# Patient Record
Sex: Female | Born: 1935 | Race: White | Hispanic: No | Smoking: Never smoker
Health system: Southern US, Community
[De-identification: ages and names within clinical notes are randomized; demographics above are authoritative.]

## PROBLEM LIST (undated history)

## (undated) DIAGNOSIS — R58 Hemorrhage, not elsewhere classified: Secondary | ICD-10-CM

## (undated) DIAGNOSIS — I87303 Chronic venous hypertension (idiopathic) without complications of bilateral lower extremity: Secondary | ICD-10-CM

## (undated) DIAGNOSIS — T7840XA Allergy, unspecified, initial encounter: Secondary | ICD-10-CM

## (undated) DIAGNOSIS — T148XXA Other injury of unspecified body region, initial encounter: Secondary | ICD-10-CM

## (undated) DIAGNOSIS — R2681 Unsteadiness on feet: Secondary | ICD-10-CM

## (undated) DIAGNOSIS — R739 Hyperglycemia, unspecified: Secondary | ICD-10-CM

## (undated) DIAGNOSIS — M8580 Other specified disorders of bone density and structure, unspecified site: Secondary | ICD-10-CM

## (undated) DIAGNOSIS — K5730 Diverticulosis of large intestine without perforation or abscess without bleeding: Secondary | ICD-10-CM

## (undated) DIAGNOSIS — D62 Acute posthemorrhagic anemia: Secondary | ICD-10-CM

## (undated) DIAGNOSIS — I10 Essential (primary) hypertension: Secondary | ICD-10-CM

## (undated) DIAGNOSIS — N183 Chronic kidney disease, stage 3 (moderate): Secondary | ICD-10-CM

## (undated) DIAGNOSIS — M7071 Other bursitis of hip, right hip: Secondary | ICD-10-CM

## (undated) DIAGNOSIS — J45909 Unspecified asthma, uncomplicated: Secondary | ICD-10-CM

## (undated) DIAGNOSIS — K5521 Angiodysplasia of colon with hemorrhage: Secondary | ICD-10-CM

## (undated) DIAGNOSIS — D126 Benign neoplasm of colon, unspecified: Secondary | ICD-10-CM

## (undated) DIAGNOSIS — E785 Hyperlipidemia, unspecified: Secondary | ICD-10-CM

## (undated) DIAGNOSIS — R32 Unspecified urinary incontinence: Secondary | ICD-10-CM

## (undated) DIAGNOSIS — H269 Unspecified cataract: Secondary | ICD-10-CM

## (undated) DIAGNOSIS — I82402 Acute embolism and thrombosis of unspecified deep veins of left lower extremity: Secondary | ICD-10-CM

## (undated) HISTORY — DX: Acute embolism and thrombosis of unspecified deep veins of left lower extremity: I82.402

## (undated) HISTORY — DX: Angiodysplasia of colon with hemorrhage: K55.21

## (undated) HISTORY — DX: Hyperglycemia, unspecified: R73.9

## (undated) HISTORY — DX: Other specified disorders of bone density and structure, unspecified site: M85.80

## (undated) HISTORY — DX: Hyperlipidemia, unspecified: E78.5

## (undated) HISTORY — DX: Allergy, unspecified, initial encounter: T78.40XA

## (undated) HISTORY — DX: Unspecified cataract: H26.9

## (undated) HISTORY — DX: Unspecified urinary incontinence: R32

## (undated) HISTORY — DX: Unspecified asthma, uncomplicated: J45.909

## (undated) HISTORY — DX: Hemorrhage, not elsewhere classified: R58

## (undated) HISTORY — DX: Chronic kidney disease, stage 3 (moderate): N18.3

## (undated) HISTORY — DX: Acute posthemorrhagic anemia: D62

## (undated) HISTORY — DX: Benign neoplasm of colon, unspecified: D12.6

## (undated) HISTORY — DX: Diverticulosis of large intestine without perforation or abscess without bleeding: K57.30

## (undated) HISTORY — DX: Chronic venous hypertension (idiopathic) without complications of bilateral lower extremity: I87.303

## (undated) HISTORY — DX: Other injury of unspecified body region, initial encounter: T14.8XXA

## (undated) HISTORY — PX: CATARACT EXTRACTION W/ INTRAOCULAR LENS IMPLANT: SHX1309

## (undated) HISTORY — DX: Unsteadiness on feet: R26.81

## (undated) HISTORY — DX: Essential (primary) hypertension: I10

---

## 1999-12-10 ENCOUNTER — Encounter: Admission: RE | Admit: 1999-12-10 | Discharge: 1999-12-10 | Payer: Self-pay | Admitting: Internal Medicine

## 1999-12-10 ENCOUNTER — Encounter: Payer: Self-pay | Admitting: Internal Medicine

## 1999-12-18 ENCOUNTER — Other Ambulatory Visit: Admission: RE | Admit: 1999-12-18 | Discharge: 2000-01-03 | Payer: Self-pay | Admitting: Internal Medicine

## 2000-12-10 ENCOUNTER — Encounter: Payer: Self-pay | Admitting: Internal Medicine

## 2000-12-10 ENCOUNTER — Encounter: Admission: RE | Admit: 2000-12-10 | Discharge: 2000-12-10 | Payer: Self-pay | Admitting: Internal Medicine

## 2001-12-11 ENCOUNTER — Encounter: Admission: RE | Admit: 2001-12-11 | Discharge: 2001-12-11 | Payer: Self-pay | Admitting: Internal Medicine

## 2001-12-11 ENCOUNTER — Encounter: Payer: Self-pay | Admitting: Internal Medicine

## 2002-12-13 ENCOUNTER — Encounter: Payer: Self-pay | Admitting: Internal Medicine

## 2002-12-13 ENCOUNTER — Encounter: Admission: RE | Admit: 2002-12-13 | Discharge: 2002-12-13 | Payer: Self-pay | Admitting: Internal Medicine

## 2002-12-21 ENCOUNTER — Other Ambulatory Visit: Admission: RE | Admit: 2002-12-21 | Discharge: 2002-12-21 | Payer: Self-pay | Admitting: Internal Medicine

## 2003-09-26 ENCOUNTER — Encounter (INDEPENDENT_AMBULATORY_CARE_PROVIDER_SITE_OTHER): Payer: Self-pay | Admitting: Specialist

## 2003-09-26 ENCOUNTER — Ambulatory Visit (HOSPITAL_COMMUNITY): Admission: RE | Admit: 2003-09-26 | Discharge: 2003-09-26 | Payer: Self-pay | Admitting: Emergency Medicine

## 2003-12-16 ENCOUNTER — Ambulatory Visit (HOSPITAL_COMMUNITY): Admission: RE | Admit: 2003-12-16 | Discharge: 2003-12-16 | Payer: Self-pay | Admitting: Internal Medicine

## 2004-05-13 HISTORY — PX: ABDOMINAL HYSTERECTOMY: SHX81

## 2004-05-31 ENCOUNTER — Encounter (INDEPENDENT_AMBULATORY_CARE_PROVIDER_SITE_OTHER): Payer: Self-pay | Admitting: Specialist

## 2004-06-01 ENCOUNTER — Inpatient Hospital Stay (HOSPITAL_COMMUNITY): Admission: RE | Admit: 2004-06-01 | Discharge: 2004-06-02 | Payer: Self-pay | Admitting: *Deleted

## 2004-12-17 ENCOUNTER — Ambulatory Visit (HOSPITAL_COMMUNITY): Admission: RE | Admit: 2004-12-17 | Discharge: 2004-12-17 | Payer: Self-pay | Admitting: Internal Medicine

## 2005-03-06 ENCOUNTER — Ambulatory Visit: Payer: Self-pay | Admitting: Gastroenterology

## 2005-03-26 ENCOUNTER — Ambulatory Visit: Payer: Self-pay | Admitting: Gastroenterology

## 2005-03-26 ENCOUNTER — Encounter (INDEPENDENT_AMBULATORY_CARE_PROVIDER_SITE_OTHER): Payer: Self-pay | Admitting: *Deleted

## 2005-03-26 DIAGNOSIS — D126 Benign neoplasm of colon, unspecified: Secondary | ICD-10-CM

## 2005-03-26 DIAGNOSIS — K5730 Diverticulosis of large intestine without perforation or abscess without bleeding: Secondary | ICD-10-CM | POA: Insufficient documentation

## 2005-03-26 DIAGNOSIS — K5521 Angiodysplasia of colon with hemorrhage: Secondary | ICD-10-CM

## 2005-03-26 HISTORY — DX: Benign neoplasm of colon, unspecified: D12.6

## 2005-03-26 HISTORY — DX: Diverticulosis of large intestine without perforation or abscess without bleeding: K57.30

## 2005-12-19 ENCOUNTER — Ambulatory Visit (HOSPITAL_COMMUNITY): Admission: RE | Admit: 2005-12-19 | Discharge: 2005-12-19 | Payer: Self-pay | Admitting: Internal Medicine

## 2006-01-09 ENCOUNTER — Ambulatory Visit (HOSPITAL_COMMUNITY): Admission: RE | Admit: 2006-01-09 | Discharge: 2006-01-09 | Payer: Self-pay | Admitting: Internal Medicine

## 2006-12-22 ENCOUNTER — Ambulatory Visit (HOSPITAL_COMMUNITY): Admission: RE | Admit: 2006-12-22 | Discharge: 2006-12-22 | Payer: Self-pay | Admitting: Internal Medicine

## 2007-12-30 ENCOUNTER — Ambulatory Visit (HOSPITAL_COMMUNITY): Admission: RE | Admit: 2007-12-30 | Discharge: 2007-12-30 | Payer: Self-pay | Admitting: Internal Medicine

## 2008-04-21 ENCOUNTER — Encounter: Admission: RE | Admit: 2008-04-21 | Discharge: 2008-04-21 | Payer: Self-pay | Admitting: Internal Medicine

## 2009-01-02 ENCOUNTER — Ambulatory Visit (HOSPITAL_COMMUNITY): Admission: RE | Admit: 2009-01-02 | Discharge: 2009-01-02 | Payer: Self-pay | Admitting: Internal Medicine

## 2010-01-12 ENCOUNTER — Ambulatory Visit (HOSPITAL_COMMUNITY): Admission: RE | Admit: 2010-01-12 | Discharge: 2010-01-12 | Payer: Self-pay | Admitting: Internal Medicine

## 2010-02-06 ENCOUNTER — Encounter: Payer: Self-pay | Admitting: Gastroenterology

## 2010-02-23 ENCOUNTER — Encounter (INDEPENDENT_AMBULATORY_CARE_PROVIDER_SITE_OTHER): Payer: Self-pay | Admitting: *Deleted

## 2010-03-26 ENCOUNTER — Encounter (INDEPENDENT_AMBULATORY_CARE_PROVIDER_SITE_OTHER): Payer: Self-pay | Admitting: *Deleted

## 2010-04-17 ENCOUNTER — Ambulatory Visit: Payer: Self-pay | Admitting: Gastroenterology

## 2010-05-15 ENCOUNTER — Ambulatory Visit (HOSPITAL_COMMUNITY)
Admission: RE | Admit: 2010-05-15 | Discharge: 2010-05-15 | Payer: Self-pay | Source: Home / Self Care | Attending: Gastroenterology | Admitting: Gastroenterology

## 2010-05-15 ENCOUNTER — Encounter: Payer: Self-pay | Admitting: Gastroenterology

## 2010-06-03 ENCOUNTER — Encounter: Payer: Self-pay | Admitting: Internal Medicine

## 2010-06-12 NOTE — Letter (Signed)
Summary: Colonoscopy-Changed to Office Visit Letter  [facility]  [address] [place], [place] [address]   Phone: [phone]  Fax: [phone]      February 06, 2010 MRN: [id number]   [facility] [address] [place], [place]  [address]   Dear Ms. Dowse,   According to our records, it is time for you to schedule a Colonoscopy. However, after reviewing your medical record, I feel that an office visit would be most appropriate to more completely evaluate you and determine your need for a repeat procedure.  Please call [phone] (option #2) at your convenience to schedule an office visit. If you have any questions, concerns, or feel that this letter is in error, we would appreciate your call.   Sincerely,  Barbette Hair. Arlyce Dice, M.D.  [facility] Gastroenterology Division [phone]

## 2010-06-12 NOTE — Letter (Signed)
Summary: [facility] Instructions  [facility]  [address] [place], [place] [address]   Phone: [phone]  Fax: [phone]       MYRLA Champine    04-19-1926    MRN: [id number]        Procedure Day /Date:TUESDAY 05/15/2010      Arrival Time:11:30AM     Procedure Time:12:30PM     Location of Procedure:                     X   [facility] ( Outpatient Registration)   PREPARATION FOR COLONOSCOPY WITH MOVIPREP/ERBY   Starting 5 days prior to your procedure12/27/2011 do not eat nuts, seeds, popcorn, corn, beans, peas,  salads, or any raw vegetables.  Do not take any fiber supplements (e.g. Metamucil, Citrucel, and Benefiber).  THE DAY BEFORE YOUR PROCEDURE         DATE: 05/14/2010  DAY: MONDAY  1.  Drink clear liquids the entire day-NO SOLID FOOD  2.  Do not drink anything colored red or purple.  Avoid juices with pulp.  No orange juice.  3.  Drink at least 64 oz. (8 glasses) of fluid/clear liquids during the day to prevent dehydration and help the prep work efficiently.  CLEAR LIQUIDS INCLUDE: Water Jello Ice Popsicles Tea (sugar ok, no milk/cream) Powdered fruit flavored drinks Coffee (sugar ok, no milk/cream) Gatorade Juice: apple, white grape, white cranberry  Lemonade Clear bullion, consomm, broth Carbonated beverages (any kind) Strained chicken noodle soup Hard Candy                             4.  In the morning, mix first dose of MoviPrep solution:    Empty 1 Pouch A and 1 Pouch B into the disposable container    Add lukewarm drinking water to the top line of the container. Mix to dissolve    Refrigerate (mixed solution should be used within 24 hrs)  5.  Begin drinking the prep at 5:00 p.m. The MoviPrep container is divided by 4 marks.   Every 15 minutes drink the solution down to the next mark (approximately 8 oz) until the full liter is complete.   6.  Follow completed prep with 16 oz of clear liquid of your choice (Nothing  red or purple).  Continue to drink clear liquids until bedtime.  7.  Before going to bed, mix second dose of MoviPrep solution:    Empty 1 Pouch A and 1 Pouch B into the disposable container    Add lukewarm drinking water to the top line of the container. Mix to dissolve    Refrigerate  THE DAY OF YOUR PROCEDURE      DATE: 05/15/2010 [id number]  Beginning at 7:30a.m. (5 hours before procedure):         1. Every 15 minutes, drink the solution down to the next mark (approx 8 oz) until the full liter is complete.  2. Follow completed prep with 16 oz. of clear liquid of your choice.    3. You may drink clear liquids until 10:30AM (2 HOURS BEFORE PROCEDURE).   MEDICATION INSTRUCTIONS  Unless otherwise instructed, you should take regular prescription medications with a small sip of water   as early as possible the morning of your procedure         OTHER INSTRUCTIONS  You will need a responsible adult at least 75 years of age to accompany  you and drive you home.   This person must remain in the waiting room during your procedure.  Wear loose fitting clothing that is easily removed.  Leave jewelry and other valuables at home.  However, you may wish to bring a book to read or  an iPod/MP3 player to listen to music as you wait for your procedure to start.  Remove all body piercing jewelry and leave at home.  Total time from sign-in until discharge is approximately 2-3 hours.  You should go home directly after your procedure and rest.  You can resume normal activities the  day after your procedure.  The day of your procedure you should not:   Drive   Make legal decisions   Operate machinery   Drink alcohol   Return to work  You will receive specific instructions about eating, activities and medications before you leave.    The above instructions have been reviewed and explained to me by   _______________________    I fully understand and can verbalize these  instructions _____________________________ Date _________

## 2010-06-12 NOTE — Letter (Signed)
Summary: New Patient letter  [facility]  [address] [place], [place] [address]   Phone: [phone]  Fax: [phone]       02/23/2010 MRN: [id number]  Yolanda [name] [address] [place], [place]  [address]  Dear Yolanda [name],  Welcome to the Gastroenterology Division at [facility].    You are scheduled to see Dr.  Arlyce Dice on 04-17-10 at 10:45a.m. on the 3rd floor at [facility], [address]  We ask that you try to arrive at our office 15 minutes prior to your appointment time to allow for check-in.  We would like you to complete the enclosed self-administered evaluation form prior to your visit and bring it with you on the day of your appointment.  We will review it with you.  Also, please bring a complete list of all your medications or, if you prefer, bring the medication bottles and we will list them.  Please bring your insurance card so that we may make a copy of it.  If your insurance requires a referral to see a specialist, please bring your referral form from your primary care physician.  Co-payments are due at the time of your visit and may be paid by cash, check or credit card.     Your office visit will consist of a consult with your physician (includes a physical exam), any laboratory testing he/she may order, scheduling of any necessary diagnostic testing (e.g. x-ray, ultrasound, CT-scan), and scheduling of a procedure (e.g. Endoscopy, Colonoscopy) if required.  Please allow enough time on your schedule to allow for any/all of these possibilities.    If you cannot keep your appointment, please call [phone] to cancel or reschedule prior to your appointment date.  This allows [place] the opportunity to schedule an appointment for another patient in need of care.  If you do not cancel or reschedule by 5 p.m. the business day prior to your appointment date, you will be charged a $50.00 late cancellation/no-show fee.    Thank  you for choosing [facility] for your medical needs.  We appreciate the opportunity to care for you.  Please visit [place] at our website  to learn more about our practice.                     Sincerely,                                                             The Gastroenterology Division

## 2010-06-12 NOTE — Procedures (Signed)
Summary: Colonoscopy   Colonoscopy  Procedure date:  03/26/2005  Findings:      569.85 Angiodysplasia, intestinalResults: Polyp.  Results: Diverticulosis.       Location:  [facility].    Procedures Next Due Date:    Colonoscopy: 03/2010  Patient Name: Yolanda [name], Yolanda [name] MRN:  Procedure Procedures: Colonoscopy CPT: 51884.    with Hot Biopsy(s)CPT: Z451292.  Personnel: Endoscopist: Barbette Hair. Arlyce Dice, MD.  Patient Consent: Procedure, Alternatives, Risks and Benefits discussed, consent obtained, from patient.  Indications  Surveillance of: Adenomatous Polyp(s). Initial polypectomy was performed in 2003.  History  Current Medications: Patient is not currently taking Coumadin.  Pre-Exam Physical: Performed Mar 26, 2005. Cardio-pulmonary exam, HEENT exam , Abdominal exam, Mental status exam WNL.  Exam Exam: Extent of exam reached: Cecum, extent intended: Cecum.  The cecum was identified by IC valve. Colon retroflexion performed. ASA Classification: I. Tolerance: good.  Monitoring: Pulse and BP monitoring, Oximetry used. Supplemental O2 given. at 2 Liters.  Colon Prep Used Miralax for colon prep. Prep results: good.  Sedation Meds: Patient assessed and found to be appropriate for moderate (conscious) sedation. Sedation was managed by the Endoscopist. Fentanyl 50 mcg. given IV. Versed 5 mg. given IV.  Findings POLYP: Hepatic Flexure, Maximum size: 3 mm. Procedure:  hot biopsy, ICD9: Colon Polyps: 211.3.  - DIVERTICULOSIS: Descending Colon to Sigmoid Colon. ICD9: Diverticulosis: 562.10. Comments: Scattered diverticula.  ANGIODYSPLASIA (AVMs): 1 AVMs, maximum size 3 mm, non-bleeding in Cecum. ICD9: Angiodysplasia, Intestinal: 569.85.  NORMAL EXAM: Cecum.   Assessment Abnormal examination, see findings above.  Diagnoses: 569.85: Angiodysplasia, Intestinal.  562.10: Diverticulosis.  211.3: Colon Polyps.   Events  Unplanned Interventions: No  intervention was required.  Unplanned Events: There were no complications. Plans  Post Exam Instructions: Post sedation instructions given.  Patient Education: Patient given standard instructions for: Polyps. Diverticulosis.  Disposition: After procedure patient sent to recovery. After recovery patient sent home.  Scheduling/Referral: Colonoscopy, to Barbette Hair. Arlyce Dice, MD, around Mar 26, 2010.    cc: Erskine Speed, MD  This report was created from the original endoscopy report, which was reviewed and signed by the above listed endoscopist.

## 2010-06-12 NOTE — Procedures (Signed)
Summary: Instructions for procedure/[place]  Instructions for procedure/[place]   Imported By: Sherian Rein 04/19/2010 10:25:01  _____________________________________________________________________  External Attachment:    Type:   Image     Comment:   External Document

## 2010-06-14 NOTE — Procedures (Signed)
Summary: Colonoscopy  Patient: Yolanda [name] Note: All result statuses are Final unless otherwise noted.  Tests: (1) Colonoscopy (COL)   COL Colonoscopy           DONE     [facility]     [address] [place], [place]  [address]           COLONOSCOPY PROCEDURE REPORT           PATIENT:  Yolanda [name], Yolanda [name]  MR#:  [id number]     BIRTHDATE:  1926-01-12, 84 yrs. old  GENDER:  female     ENDOSCOPIST:  Barbette Hair. Arlyce Dice, MD     REF. BY:  Nila Nephew, M.D.     PROCEDURE DATE:  05/15/2010     PROCEDURE:  Colonoscopy with ablation     ASA CLASS:  Class II     INDICATIONS:  screening, history of pre-cancerous (adenomatous)     colon polyps, other Last exam 2006. 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  Digital rectal exam was performed and     revealed no abnormalities.   The [id number]) endoscope was     introduced through the anus and advanced to the cecum, which was     identified by both the appendix and ileocecal valve, without     limitations.  The quality of the prep was excellent, using     MoviPrep.  The instrument was then slowly withdrawn as the colon     was fully examined.     <<PROCEDUREIMAGES>>           FINDINGS:  Arteriovenous malformations were seen in the in the     cecum. 2 cecal AVMS, the largest measuring 4mm, smaller AVM .     The latter was fulgurated with the APC. I decided not to fulgurate     the larger lesion because of increased risk for acute bleeding     (see image4, image6, and image10).  Scattered diverticula were     found. Few scattered diverticula descending to ascending colon     This was otherwise a normal examination of the colon (see image5,     image13, image17, image18, and image20).   Retroflexed views  in     the rectum revealed no abnormalities.    The scope was then     withdrawn from the patient and the procedure completed.           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 GI follow up as     needed     REPEAT EXAM:  No           ______________________________     Barbette Hair. Arlyce Dice, MD           CC:           n.  eSIGNED:   Barbette Hair. Kaplan at 05/15/2010 01:13 PM           [place], Shirlee Limerick, [phone]  Note: An exclamation mark (!) indicates a result that was not dispersed into the flowsheet. Document Creation Date: 05/15/2010 1:14 PM _______________________________________________________________________  (1) Order result status: Final Collection or observation date-time: 05/15/2010 13:04 Requested date-time:  Receipt date-time:  Reported date-time:  Referring Physician:   Ordering Physician: Melvia Heaps [phone]) Specimen Source:  Source: Launa Grill Order Number: [id number] Lab site:

## 2010-06-14 NOTE — Assessment & Plan Note (Signed)
Summary: discuss colon due to age--ch.   History of Present Illness Visit Type: Initial Visit Primary GI MD: Melvia Heaps MD [facility] Primary Provider: Nila Nephew, MD Chief Complaint: discuss colon due to age History of Present Illness:   Yolanda [name] is an 77 year old white female referred at the request of Dr. Chilton Si for colonoscopy.  Three colon polyps were removed in 2003.  A single adenomatous polyp was removed in 2006.  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  The patient has  no GI complaints including change in bowel habits, abdominal pain, melena or hematochezia.  She has lost 40 pounds over the past year from dieting.   GI Review of Systems    Reports weight loss.      Denies abdominal pain, acid reflux, belching, bloating, chest pain, dysphagia with liquids, dysphagia with solids, heartburn, loss of appetite, nausea, vomiting, vomiting blood, and  weight gain.        Denies anal fissure, black tarry stools, change in bowel habit, constipation, diarrhea, diverticulosis, fecal incontinence, heme positive stool, hemorrhoids, irritable bowel syndrome, jaundice, light color stool, liver problems, rectal bleeding, and  rectal pain. Preventive Screening-Counseling & Management      Drug Use:  no.      Current Medications (verified): 1)  Terazosin Hcl 10 Mg Caps (Terazosin Hcl) .... 2 By Mouth Once Daily 2)  Lisinopril 40 Mg Tabs (Lisinopril) .Marland Kitchen.. 1 By Mouth Once Daily 3)  Tramadol Hcl 50 Mg Tabs (Tramadol Hcl) .... Take 1 Po Two Times A Day 4)  Aspirin 81 Mg Tbec (Aspirin) .Marland Kitchen.. 1 By Mouth Once Daily 5)  Prednisone 5 Mg Tabs (Prednisone) .Marland Kitchen.. 1 By Mouth Once Daily 6)  Crestor 5 Mg Tabs (Rosuvastatin Calcium) .Marland Kitchen.. 1 By Mouth Once Daily  Allergies (verified): 1)  ! Epinephrine 2)  ! Vicodin  Past History:  Past Medical History: Diverticulosis Hyperplastic Polyps 2006 Adenomatous Polyp 2006 Arthritis Diabetes Kidney Stones Obesity  Past Surgical History: Reviewed  history from 04/12/2010 and no changes required. Hysterectomy  Family History: Reviewed history and no changes required. Family History of Breast Cancer:mother No FH of Colon Cancer:  Social History: Reviewed history from 04/12/2010 and no changes required. Occupation: Retired Patient has never smoked.  Alcohol Use - no Daily Caffeine Use Illicit Drug Use - no married 2 boysDrug Use:  no  Review of Systems       The patient complains of arthritis/joint pain and back pain.  The patient denies allergy/sinus, anemia, anxiety-new, blood in urine, breast changes/lumps, change in vision, confusion, cough, coughing up blood, depression-new, fainting, fatigue, fever, headaches-new, hearing problems, heart murmur, heart rhythm changes, itching, menstrual pain, muscle pains/cramps, night sweats, nosebleeds, pregnancy symptoms, shortness of breath, skin rash, sleeping problems, sore throat, swelling of feet/legs, swollen lymph glands, thirst - excessive , urination - excessive , urination changes/pain, urine leakage, vision changes, and voice change.         All other systems were reviewed and were negative   Vital Signs:  Patient profile:   77 year old female Height:      65 inches Weight:      183 pounds BMI:     30.56 Pulse rate:   84 / minute Pulse rhythm:   regular BP sitting:   108 / 48  (left arm)  Vitals Entered By: Milford Cage NCMA (April 17, 2010 11:09 AM)  Physical Exam  Additional Exam:  On physical exam she is a well-developed well-nourished female  skin: anicteric HEENT: normocephalic; PEERLA; no nasal or pharyngeal abnormalities neck: supple nodes: no cervical lymphadenopathy chest: clear to ausculatation and percussion heart: no, gallops, or rubs; there is a 2/6 early systolic murmur at the left sternal border abd: soft, nontender; BS normoactive; no abdominal masses, tenderness, organomegaly rectal: deferred ext: no cynanosis, clubbing; there is 1+ pedal  edema skeletal: no deformities neuro: oriented x 3; no focal abnormalities    Impression & Recommendations:  Problem # 1:  COLONIC POLYPS (ICD-211.3)  Plan followup colonoscopy  Orders: ZCOL APC (ZCOL APC)  Problem # 2:  ANGIODYSPLASIA, INTESTINE, WITH HEMORRHAGE (ICD-569.85)  AVM seen at colonoscopy.   I will cauterize it with the argon plasma coagulator because of its potential for bleeding if  identified   Patient Instructions: 1)  Copy sent to : Nila Nephew, MD 2)  Your Colonoscopy with [facility] is scheduled on 05/15/2010 at 12:30pm at [facility]. 3)  We are sending the MoviPrep in to your pharmacy today 4)  Colonoscopy and Flexible Sigmoidoscopy brochure given.  5)  Conscious Sedation brochure given.  6)  The medication list was reviewed and reconciled.  All changed / newly prescribed medications were explained.  A complete medication list was provided to the patient / caregiver. Prescriptions: MOVIPREP 100 GM  SOLR (PEG-KCL-NACL-NASULF-NA ASC-C) As per prep instructions.  #1 x 0   Entered by:   Merri Ray CMA (AAMA)   Authorized by:   Louis Meckel MD   Signed by:   Merri Ray CMA (AAMA) on 04/17/2010   Method used:   Electronically to        [facility]  [phone]* (retail)       [address]       [place], [place]  [address]       Ph: [phone] or [phone]       Fax: [phone]   RxID:   [id number]

## 2010-09-28 NOTE — Discharge Summary (Signed)
NAME:  Yolanda [name], Yolanda [name] NO.:  [id number]   MEDICAL RECORD NO.:  [id number]          PATIENT TYPE:  INP   LOCATION:  9304                          FACILITY:  [facility]   PHYSICIAN:  Kingfisher B. Earlene Plater, M.D.  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   PROCEDURES:  1.  Transvaginal hysterectomy.  2.  Uterosacral ligament suspension, vaginal vault.  3.  Anterior and posterior repair.  4.  Cystoscopy   HISTORY OF PRESENT ILLNESS:  A 77 year old white female, history is outlined  above, presents for definitive surgical management.   On the day of surgery, the patient underwent above procedures without  incident.  Cystoscopy was also performed, after the ligament suspension  which showed patency of each ureter.  Postoperatively, the patient rapidly  regained her ability to ambulate, void, and tolerate a regular diet.  On the  second postoperative day, she had a mild temperature elevation to 99-100  degree range.  She was noted to have crackles in the right lower lobe.  Chest x-ray and CBC within normal limits.  The crackling resolved with  incentive spirometry.  The patient was discharged home on the second  postoperative day in satisfactory condition.   DISCHARGE INSTRUCTIONS:  Standard instructions given prior to dismissal.   DISCHARGE MEDICATIONS:  1.  Resume blood pressure medicines in 4-5 days.  2.  Vicodin 1-2 tabs every four to six hours p.r.n. pain.   FOLLOW UP:  Follow up, [facility], Dr. Earlene Plater in four weeks.   DISPOSITION:  Satisfactory.     Wesl   WBD/MEDQ  D:  06/02/2004  T:  06/02/2004  Job:  [id number]   cc:   Erskine Speed,  M.D.  [address]  [place]  [place] [address]  Fax: [phone]

## 2010-09-28 NOTE — Op Note (Signed)
NAME:  Yolanda [name], Yolanda [name]                     ACCOUNT NO.:  [id number]   MEDICAL RECORD NO.:  [id number]                   PATIENT TYPE:  AMB   LOCATION:  SDC                                  [facility]ILITY:  [facility]   PHYSICIAN:  Wrigley B. Earlene Plater, M.D.               DATE OF BIRTH:  [birth year]   DATE OF PROCEDURE:  09/26/2003  DATE OF DISCHARGE:                                 OPERATIVE REPORT   PREOPERATIVE DIAGNOSES:  Postmenopausal bleeding and possible endometrial  polyp.   POSTOPERATIVE DIAGNOSES:  Postmenopausal bleeding and possible endometrial  polyp.   PROCEDURE:  Hysteroscopy with polyp removal.   SURGEON:  Dr. Marina Gravel.   ANESTHESIA:  MAC and 20 cc of 1% lidocaine paracervical block.   FINDINGS:  A stenotic internal cervical os, endocervical polyp and uterine  prolapse.   SPECIMENS:  Endocervical polyp.   ESTIMATED BLOOD LOSS:  Less than 50 cc.   COMPLICATIONS:  None.   INDICATIONS:  The patient with a history of postmenopausal bleeding.  Ultrasound in the office showed a focal mass consistent with an endometrial  polyp.   PROCEDURE:  The patient was taken to the operating room and MAC anesthesia  obtained.  She was placed in the Alice stirrups and prepped and draped in a  standard fashion.  The bladder was emptied with a red rubber catheter.   The examination under anesthesia showed a mid plane normal-sized uterus.  No  adnexal masses.   The speculum was inserted.  The single-toothed tenaculum attached and  paracervical block placed in a standard technique.   I attempted to sound the uterus however, could not pass above the  endocervical os with the sound or small dilator.  I therefore requested  ultrasound.  While waiting, I did remove an endocervical polyp and insert  the hysteroscope after being flushed with Sorbitol.  The endocervical cavity  appeared normal.  There was no obvious passage up into the uterine cavity  visualized.   Under ultrasound  guidance, I attempted to sound the uterus and could not  pass into the endometrial cavity.  The visualization of the uterine fundus  was marginal due to instruments and bowel gas.  This was despite filling the  bladder with 180 cc of saline.   In that a specimen was removed and I could not pass into the uterine cavity,  I terminated the procedure.   The patient tolerated the procedure well.  There were no complications.  She  was taken to the recovery room awake, alert and in stable condition.                                               Gerri Spore B. Earlene Plater, M.D.    WBD/MEDQ  D:  09/26/2003  T:  09/26/2003  Job:  [id number] 

## 2010-09-28 NOTE — Op Note (Signed)
NAME:  Yolanda [name], Yolanda [name] NO.:  [id number]   MEDICAL RECORD NO.:  [id number]          PATIENT TYPE:  OBV   LOCATION:  9399                          FACILITY:  [facility]   PHYSICIAN:  Gackle B. Earlene Plater, M.D.  DATE OF BIRTH:  [birth year]   DATE OF PROCEDURE:  05/31/2004  DATE OF DISCHARGE:                                 OPERATIVE REPORT   PREOPERATIVE DIAGNOSES:  1.  Uterine prolapse.  2.  Cystocele.  3.  Rectocele.  4.  Vaginal vault prolapse.   PROCEDURES:  1.  Transvaginal hysterectomy.  2.  Anterior and posterior repair.  3.  Uterosacral ligament suspension.  4.  Cystoscopy.   SURGEON:  Chester Holstein. Earlene Plater, M.D.   ASSISTANT:  Cordelia Pen A. Rosalio Macadamia, M.D.   ANESTHESIA:  General.   FINDINGS:  A complete procidentia of the uterus, cervix and vaginal vault.  Also associated cystocele and rectocele.   INDICATIONS:  Patient with above history and associated vaginal pressure and  discomfort.  This was not successfully managed with various pessaries.  The  patient presents for definitive surgical therapy.  She was advised of the  risks, including infection, bleeding, damage to bowel, bladder or  surrounding organs, the potential injury to ureter and potential vaginal  shortening which might make intercourse painful.  She was offered  alternatives such as continued pessary use or colpocleisis; however, the  patient wished to maintain sexual function.   PROCEDURE:  The patient was taken to the operating room and general  anesthesia was obtained.  She was prepped and draped in standard fashion and  a Foley catheter inserted into the bladder.  The patient was then examined  under anesthesia with the above findings noted.  Rectovaginal exam showed a  small rectocele, no evidence of enterocele.   The cervix was replaced to a more anatomical position and a weighted  speculum inserted into the posterior vagina.  The posterior cul-de-sac was  entered sharply and each uterosacral  ligament clamped with a curved Heaney  clamp, divided sharply and suture ligated with 0 Vicryl.  The remainder of  the vagina was circumscribed with the Bovie and the anterior colpotomy made  sharply.  Deaver retractor placed into the anterior cul-de-sac.  The ureter  palpated on each side through the anterior cul-de-sac.   The remainder of the cardinal ligaments were then serially clamped with the  LigaSure, cauterized and divided.  This continued to the level of the  cornua.  Each ovary appeared normal, and the patient had requested to retain  them should they appear normal.  Therefore, each uterine cornu was clamped  with the LigaSure and cauterized and divided, uterus and cervix delivered as  an intact specimen.   The patient's left uterosacral ligament was placed on traction and grasped  more dorsally toward the sacrum at approximately the midportion.  The course  of the ureter was palpated and was about 4 cm ventral to the area of  interest.  The horizon was visualized in this area from 3 to 9 o'clock and  the 0 Vicryl suture needle kept below this horizon.  A figure-of-eight  bite  of the uterosacral ligament was obtained and tagged for later use.  Repeated  on the opposite side in a similar fashion.   The anterior repair was then performed by undermining the vaginal mucosa in  the midline and incising it sharply.  The pubovesical fascia was divided  sharply.  The redundant vaginal mucosa was excised and the pubovesical  fascia reapproximated with interrupted sutures of 0 Vicryl.  The vaginal  mucosa was then closed in a running locked stitch of 0 Vicryl.  Indigo  carmine was given IV.   Each uterosacral ligament suspension suture was then tied down and  cystoscopy performed.  Immediate flow was noted from the right side.  The  left side was a little bit sluggish but responded almost immediately to a  single dose of IV Lasix; therefore, I think Lasix probably had not had time   to take effect, simply the ureter began to freely spill.  The bladder itself  showed no evidence of injury as well. The Foley catheter was placed after  the cystoscopy.    The uterosacral ligament suspension sutures were then used to secure the  vaginal apex anteriorly and posteriorly, including the peritoneum and  fascia.  The remainder of the incision was then closed with interrupted  figure-of-eight sutures of 0 Vicryl.   There was a small residual rectocele noted.  The perineal body was grasped  with Allis clamps and incised sharply with a knife.  The vaginal mucosa was  undermined in the midline with the Metzenbaum scissors and incised sharply.  The rectovaginal fascia was then dissected sharply.  The rectovaginal fascia  was then plicated in the midline with interrupted sutures of 0 Vicryl.  The  redundant vaginal mucosa was then trimmed away.  The vaginal incision then  closed with running locked stitch of 0 Vicryl.  Perineum was reapproximated  with a figure-of-eight suture of 0 Vicryl and the overlying skin closed with  subcuticular 3-0 Vicryl.  A rectal exam showed integrity of the rectum  without any foreign body palpable.  The vagina was packed with Estrace-  soaked gauze.   The patient tolerated the procedure well.  There were no complications.  She  was taken to the recovery room awake and alert, in stable condition.  All  counts were correct.     Wesl   WBD/MEDQ  D:  05/31/2004  T:  05/31/2004  Job:  [id number]   cc:   Erskine Speed, M.D.  [address]  [place]  [place] [address]  Fax: [phone]

## 2010-12-24 ENCOUNTER — Other Ambulatory Visit (HOSPITAL_COMMUNITY): Payer: Self-pay | Admitting: Internal Medicine

## 2010-12-24 DIAGNOSIS — Z1231 Encounter for screening mammogram for malignant neoplasm of breast: Secondary | ICD-10-CM

## 2011-01-15 ENCOUNTER — Ambulatory Visit (HOSPITAL_COMMUNITY)
Admission: RE | Admit: 2011-01-15 | Discharge: 2011-01-15 | Disposition: A | Discharge status: Home / Self Care | Payer: 59 | Source: Ambulatory Visit | Attending: Internal Medicine | Admitting: Internal Medicine

## 2011-01-15 DIAGNOSIS — Z1231 Encounter for screening mammogram for malignant neoplasm of breast: Secondary | ICD-10-CM

## 2011-12-25 ENCOUNTER — Other Ambulatory Visit (HOSPITAL_COMMUNITY): Payer: Self-pay | Admitting: Internal Medicine

## 2011-12-25 DIAGNOSIS — M8580 Other specified disorders of bone density and structure, unspecified site: Secondary | ICD-10-CM

## 2011-12-25 DIAGNOSIS — Z1231 Encounter for screening mammogram for malignant neoplasm of breast: Secondary | ICD-10-CM

## 2012-01-21 ENCOUNTER — Ambulatory Visit (HOSPITAL_COMMUNITY)
Admission: RE | Admit: 2012-01-21 | Discharge: 2012-01-21 | Disposition: A | Discharge status: Home / Self Care | Payer: 59 | Source: Ambulatory Visit | Attending: Internal Medicine | Admitting: Internal Medicine

## 2012-01-21 DIAGNOSIS — Z1231 Encounter for screening mammogram for malignant neoplasm of breast: Secondary | ICD-10-CM

## 2012-01-21 DIAGNOSIS — M8580 Other specified disorders of bone density and structure, unspecified site: Secondary | ICD-10-CM

## 2012-12-16 ENCOUNTER — Other Ambulatory Visit (HOSPITAL_COMMUNITY): Payer: Self-pay | Admitting: Internal Medicine

## 2012-12-16 DIAGNOSIS — Z1231 Encounter for screening mammogram for malignant neoplasm of breast: Secondary | ICD-10-CM

## 2013-01-21 ENCOUNTER — Ambulatory Visit (HOSPITAL_COMMUNITY)
Admission: RE | Admit: 2013-01-21 | Discharge: 2013-01-21 | Disposition: A | Discharge status: Home / Self Care | Payer: PRIVATE HEALTH INSURANCE | Source: Ambulatory Visit | Attending: Internal Medicine | Admitting: Internal Medicine

## 2013-01-21 DIAGNOSIS — Z1231 Encounter for screening mammogram for malignant neoplasm of breast: Secondary | ICD-10-CM

## 2013-12-28 ENCOUNTER — Other Ambulatory Visit (HOSPITAL_COMMUNITY): Payer: Self-pay | Admitting: Internal Medicine

## 2013-12-28 DIAGNOSIS — Z1231 Encounter for screening mammogram for malignant neoplasm of breast: Secondary | ICD-10-CM

## 2013-12-28 DIAGNOSIS — M8580 Other specified disorders of bone density and structure, unspecified site: Secondary | ICD-10-CM

## 2014-01-27 ENCOUNTER — Ambulatory Visit (HOSPITAL_COMMUNITY)
Admission: RE | Admit: 2014-01-27 | Discharge: 2014-01-27 | Disposition: A | Discharge status: Home / Self Care | Payer: Medicare Other | Source: Ambulatory Visit | Attending: Internal Medicine | Admitting: Internal Medicine

## 2014-01-27 DIAGNOSIS — M8580 Other specified disorders of bone density and structure, unspecified site: Secondary | ICD-10-CM

## 2014-01-27 DIAGNOSIS — Z780 Asymptomatic menopausal state: Secondary | ICD-10-CM | POA: Insufficient documentation

## 2014-01-27 DIAGNOSIS — Z1231 Encounter for screening mammogram for malignant neoplasm of breast: Secondary | ICD-10-CM | POA: Insufficient documentation

## 2014-01-27 DIAGNOSIS — Z13820 Encounter for screening for osteoporosis: Secondary | ICD-10-CM | POA: Diagnosis not present

## 2014-01-28 ENCOUNTER — Other Ambulatory Visit: Payer: Self-pay | Admitting: Internal Medicine

## 2014-01-28 DIAGNOSIS — R928 Other abnormal and inconclusive findings on diagnostic imaging of breast: Secondary | ICD-10-CM

## 2014-02-03 ENCOUNTER — Ambulatory Visit
Admission: RE | Admit: 2014-02-03 | Discharge: 2014-02-03 | Disposition: A | Discharge status: Home / Self Care | Payer: Medicare Other | Source: Ambulatory Visit | Attending: Internal Medicine | Admitting: Internal Medicine

## 2014-02-03 DIAGNOSIS — R928 Other abnormal and inconclusive findings on diagnostic imaging of breast: Secondary | ICD-10-CM

## 2014-03-31 ENCOUNTER — Other Ambulatory Visit: Payer: Self-pay | Admitting: Internal Medicine

## 2014-03-31 DIAGNOSIS — I82402 Acute embolism and thrombosis of unspecified deep veins of left lower extremity: Secondary | ICD-10-CM

## 2014-03-31 DIAGNOSIS — M79605 Pain in left leg: Secondary | ICD-10-CM

## 2014-07-19 DIAGNOSIS — M1990 Unspecified osteoarthritis, unspecified site: Secondary | ICD-10-CM | POA: Diagnosis not present

## 2014-07-19 DIAGNOSIS — I10 Essential (primary) hypertension: Secondary | ICD-10-CM | POA: Diagnosis not present

## 2015-01-02 DIAGNOSIS — E118 Type 2 diabetes mellitus with unspecified complications: Secondary | ICD-10-CM | POA: Diagnosis not present

## 2015-01-02 DIAGNOSIS — E1165 Type 2 diabetes mellitus with hyperglycemia: Secondary | ICD-10-CM | POA: Diagnosis not present

## 2015-01-02 DIAGNOSIS — D559 Anemia due to enzyme disorder, unspecified: Secondary | ICD-10-CM | POA: Diagnosis not present

## 2015-01-02 DIAGNOSIS — Z23 Encounter for immunization: Secondary | ICD-10-CM | POA: Diagnosis not present

## 2015-01-02 DIAGNOSIS — Z0000 Encounter for general adult medical examination without abnormal findings: Secondary | ICD-10-CM | POA: Diagnosis not present

## 2015-01-02 DIAGNOSIS — I10 Essential (primary) hypertension: Secondary | ICD-10-CM | POA: Diagnosis not present

## 2015-01-03 ENCOUNTER — Other Ambulatory Visit: Payer: Self-pay

## 2015-01-03 DIAGNOSIS — Z1231 Encounter for screening mammogram for malignant neoplasm of breast: Secondary | ICD-10-CM

## 2015-02-06 ENCOUNTER — Ambulatory Visit
Admission: RE | Admit: 2015-02-06 | Discharge: 2015-02-06 | Disposition: A | Discharge status: Home / Self Care | Payer: Medicare Other | Source: Ambulatory Visit

## 2015-02-06 DIAGNOSIS — Z1231 Encounter for screening mammogram for malignant neoplasm of breast: Secondary | ICD-10-CM | POA: Diagnosis not present

## 2015-02-08 LAB — HM MAMMOGRAPHY

## 2015-03-02 DIAGNOSIS — Z961 Presence of intraocular lens: Secondary | ICD-10-CM | POA: Diagnosis not present

## 2015-03-02 DIAGNOSIS — H353131 Nonexudative age-related macular degeneration, bilateral, early dry stage: Secondary | ICD-10-CM | POA: Diagnosis not present

## 2015-04-19 DIAGNOSIS — I10 Essential (primary) hypertension: Secondary | ICD-10-CM | POA: Diagnosis not present

## 2015-04-19 DIAGNOSIS — M1990 Unspecified osteoarthritis, unspecified site: Secondary | ICD-10-CM | POA: Diagnosis not present

## 2015-08-28 ENCOUNTER — Ambulatory Visit
Admission: RE | Admit: 2015-08-28 | Discharge: 2015-08-28 | Disposition: A | Discharge status: Home / Self Care | Payer: Medicare Other | Source: Ambulatory Visit | Attending: Internal Medicine | Admitting: Internal Medicine

## 2015-08-28 ENCOUNTER — Other Ambulatory Visit: Payer: Self-pay | Admitting: Internal Medicine

## 2015-08-28 DIAGNOSIS — M25559 Pain in unspecified hip: Secondary | ICD-10-CM

## 2015-08-28 DIAGNOSIS — M25551 Pain in right hip: Secondary | ICD-10-CM | POA: Diagnosis not present

## 2015-08-28 DIAGNOSIS — I119 Hypertensive heart disease without heart failure: Secondary | ICD-10-CM | POA: Diagnosis not present

## 2015-08-28 DIAGNOSIS — M25552 Pain in left hip: Secondary | ICD-10-CM | POA: Diagnosis not present

## 2015-08-28 DIAGNOSIS — M1990 Unspecified osteoarthritis, unspecified site: Secondary | ICD-10-CM | POA: Diagnosis not present

## 2015-09-15 DIAGNOSIS — M1990 Unspecified osteoarthritis, unspecified site: Secondary | ICD-10-CM | POA: Diagnosis not present

## 2015-09-15 DIAGNOSIS — E1165 Type 2 diabetes mellitus with hyperglycemia: Secondary | ICD-10-CM | POA: Diagnosis not present

## 2015-11-03 ENCOUNTER — Ambulatory Visit (HOSPITAL_BASED_OUTPATIENT_CLINIC_OR_DEPARTMENT_OTHER)
Admission: RE | Admit: 2015-11-03 | Discharge: 2015-11-03 | Disposition: A | Discharge status: Home / Self Care | Payer: Medicare Other | Source: Ambulatory Visit | Attending: Physician Assistant | Admitting: Physician Assistant

## 2015-11-03 ENCOUNTER — Encounter (HOSPITAL_COMMUNITY): Payer: Self-pay | Admitting: Internal Medicine

## 2015-11-03 ENCOUNTER — Ambulatory Visit (INDEPENDENT_AMBULATORY_CARE_PROVIDER_SITE_OTHER): Payer: Medicare Other | Admitting: Physician Assistant

## 2015-11-03 ENCOUNTER — Encounter: Payer: Self-pay | Admitting: Physician Assistant

## 2015-11-03 ENCOUNTER — Emergency Department (HOSPITAL_COMMUNITY): Payer: Medicare Other

## 2015-11-03 ENCOUNTER — Ambulatory Visit (HOSPITAL_COMMUNITY): Payer: Medicare Other

## 2015-11-03 ENCOUNTER — Ambulatory Visit (INDEPENDENT_AMBULATORY_CARE_PROVIDER_SITE_OTHER): Payer: Medicare Other

## 2015-11-03 ENCOUNTER — Encounter (HOSPITAL_COMMUNITY): Payer: Self-pay | Admitting: *Deleted

## 2015-11-03 ENCOUNTER — Other Ambulatory Visit: Payer: Self-pay

## 2015-11-03 ENCOUNTER — Ambulatory Visit: Payer: Medicare Other

## 2015-11-03 ENCOUNTER — Observation Stay (HOSPITAL_COMMUNITY)
Admission: EM | Admit: 2015-11-03 | Discharge: 2015-11-07 | Disposition: A | Discharge status: Home / Self Care | Payer: Medicare Other | Source: Home / Self Care | Attending: Internal Medicine | Admitting: Internal Medicine

## 2015-11-03 VITALS — BP 128/60 | HR 113 | Temp 98.3°F | Resp 19 | Ht 63.0 in | Wt 144.4 lb

## 2015-11-03 DIAGNOSIS — K921 Melena: Secondary | ICD-10-CM | POA: Insufficient documentation

## 2015-11-03 DIAGNOSIS — I82402 Acute embolism and thrombosis of unspecified deep veins of left lower extremity: Secondary | ICD-10-CM

## 2015-11-03 DIAGNOSIS — R600 Localized edema: Secondary | ICD-10-CM | POA: Diagnosis not present

## 2015-11-03 DIAGNOSIS — Q2733 Arteriovenous malformation of digestive system vessel: Secondary | ICD-10-CM

## 2015-11-03 DIAGNOSIS — K5730 Diverticulosis of large intestine without perforation or abscess without bleeding: Secondary | ICD-10-CM | POA: Insufficient documentation

## 2015-11-03 DIAGNOSIS — Z86718 Personal history of other venous thrombosis and embolism: Secondary | ICD-10-CM | POA: Diagnosis present

## 2015-11-03 DIAGNOSIS — L89899 Pressure ulcer of other site, unspecified stage: Secondary | ICD-10-CM | POA: Insufficient documentation

## 2015-11-03 DIAGNOSIS — K648 Other hemorrhoids: Secondary | ICD-10-CM | POA: Insufficient documentation

## 2015-11-03 DIAGNOSIS — I130 Hypertensive heart and chronic kidney disease with heart failure and stage 1 through stage 4 chronic kidney disease, or unspecified chronic kidney disease: Secondary | ICD-10-CM | POA: Diagnosis present

## 2015-11-03 DIAGNOSIS — I10 Essential (primary) hypertension: Secondary | ICD-10-CM | POA: Diagnosis not present

## 2015-11-03 DIAGNOSIS — M7989 Other specified soft tissue disorders: Secondary | ICD-10-CM | POA: Diagnosis not present

## 2015-11-03 DIAGNOSIS — N183 Chronic kidney disease, stage 3 unspecified: Secondary | ICD-10-CM | POA: Diagnosis present

## 2015-11-03 DIAGNOSIS — K5521 Angiodysplasia of colon with hemorrhage: Secondary | ICD-10-CM | POA: Insufficient documentation

## 2015-11-03 DIAGNOSIS — I82492 Acute embolism and thrombosis of other specified deep vein of left lower extremity: Secondary | ICD-10-CM | POA: Insufficient documentation

## 2015-11-03 DIAGNOSIS — R778 Other specified abnormalities of plasma proteins: Secondary | ICD-10-CM | POA: Diagnosis present

## 2015-11-03 DIAGNOSIS — R58 Hemorrhage, not elsewhere classified: Secondary | ICD-10-CM | POA: Diagnosis not present

## 2015-11-03 DIAGNOSIS — I82432 Acute embolism and thrombosis of left popliteal vein: Secondary | ICD-10-CM | POA: Diagnosis not present

## 2015-11-03 DIAGNOSIS — M7071 Other bursitis of hip, right hip: Secondary | ICD-10-CM | POA: Diagnosis not present

## 2015-11-03 DIAGNOSIS — D62 Acute posthemorrhagic anemia: Secondary | ICD-10-CM | POA: Insufficient documentation

## 2015-11-03 DIAGNOSIS — J449 Chronic obstructive pulmonary disease, unspecified: Secondary | ICD-10-CM | POA: Diagnosis not present

## 2015-11-03 DIAGNOSIS — K5520 Angiodysplasia of colon without hemorrhage: Secondary | ICD-10-CM | POA: Insufficient documentation

## 2015-11-03 DIAGNOSIS — I824Z2 Acute embolism and thrombosis of unspecified deep veins of left distal lower extremity: Secondary | ICD-10-CM | POA: Diagnosis not present

## 2015-11-03 DIAGNOSIS — R7989 Other specified abnormal findings of blood chemistry: Secondary | ICD-10-CM | POA: Diagnosis not present

## 2015-11-03 DIAGNOSIS — L89222 Pressure ulcer of left hip, stage 2: Secondary | ICD-10-CM | POA: Insufficient documentation

## 2015-11-03 DIAGNOSIS — I82412 Acute embolism and thrombosis of left femoral vein: Secondary | ICD-10-CM | POA: Diagnosis not present

## 2015-11-03 DIAGNOSIS — O2230 Deep phlebothrombosis in pregnancy, unspecified trimester: Secondary | ICD-10-CM

## 2015-11-03 DIAGNOSIS — I87303 Chronic venous hypertension (idiopathic) without complications of bilateral lower extremity: Secondary | ICD-10-CM | POA: Diagnosis present

## 2015-11-03 DIAGNOSIS — E785 Hyperlipidemia, unspecified: Secondary | ICD-10-CM | POA: Diagnosis present

## 2015-11-03 DIAGNOSIS — M79605 Pain in left leg: Secondary | ICD-10-CM | POA: Insufficient documentation

## 2015-11-03 DIAGNOSIS — K922 Gastrointestinal hemorrhage, unspecified: Secondary | ICD-10-CM | POA: Insufficient documentation

## 2015-11-03 HISTORY — DX: Acute embolism and thrombosis of unspecified deep veins of left lower extremity: I82.402

## 2015-11-03 HISTORY — DX: Other bursitis of hip, right hip: M70.71

## 2015-11-03 LAB — COMPLETE METABOLIC PANEL WITH GFR
ALK PHOS: 44 U/L (ref 33–130)
ALT: 18 U/L (ref 6–29)
AST: 20 U/L (ref 10–35)
Albumin: 3.6 g/dL (ref 3.6–5.1)
BUN: 56 mg/dL — AB (ref 7–25)
CO2: 24 mmol/L (ref 20–31)
Calcium: 9.9 mg/dL (ref 8.6–10.4)
Chloride: 102 mmol/L (ref 98–110)
Creat: 1.63 mg/dL — ABNORMAL HIGH (ref 0.60–0.88)
GFR, Est African American: 32 mL/min — ABNORMAL LOW (ref >=60)
GFR, Est Non African American: 28 mL/min — ABNORMAL LOW (ref >=60)
GLUCOSE: 148 mg/dL — AB (ref 65–99)
Potassium: 4.8 mmol/L (ref 3.5–5.3)
SODIUM: 138 mmol/L (ref 135–146)
TOTAL PROTEIN: 6 g/dL — AB (ref 6.1–8.1)
Total Bilirubin: 0.8 mg/dL (ref 0.2–1.2)

## 2015-11-03 LAB — COMPREHENSIVE METABOLIC PANEL
ALT: 21 U/L (ref 14–54)
ANION GAP: 10 (ref 5–15)
AST: 25 U/L (ref 15–41)
Albumin: 3.4 g/dL — ABNORMAL LOW (ref 3.5–5.0)
Alkaline Phosphatase: 43 U/L (ref 38–126)
BUN: 55 mg/dL — ABNORMAL HIGH (ref 6–20)
CHLORIDE: 104 mmol/L (ref 101–111)
CO2: 25 mmol/L (ref 22–32)
Calcium: 10 mg/dL (ref 8.9–10.3)
Creatinine, Ser: 1.62 mg/dL — ABNORMAL HIGH (ref 0.44–1.00)
GFR, EST AFRICAN AMERICAN: 32 mL/min — AB (ref >60)
GFR, EST NON AFRICAN AMERICAN: 27 mL/min — AB (ref >60)
Glucose, Bld: 113 mg/dL — ABNORMAL HIGH (ref 65–99)
Potassium: 4.1 mmol/L (ref 3.5–5.1)
Sodium: 139 mmol/L (ref 135–145)
Total Bilirubin: 1.2 mg/dL (ref 0.3–1.2)
Total Protein: 5.7 g/dL — ABNORMAL LOW (ref 6.5–8.1)

## 2015-11-03 LAB — CBC WITH DIFFERENTIAL/PLATELET
BASOS PCT: 0 %
Basophils Absolute: 0 10*3/uL (ref 0.0–0.1)
EOS ABS: 0.1 10*3/uL (ref 0.0–0.7)
Eosinophils Relative: 1 %
HCT: 36.8 % (ref 36.0–46.0)
HEMOGLOBIN: 12.4 g/dL (ref 12.0–15.0)
Lymphocytes Relative: 16 %
Lymphs Abs: 1.8 10*3/uL (ref 0.7–4.0)
MCH: 28.7 pg (ref 26.0–34.0)
MCHC: 33.7 g/dL (ref 30.0–36.0)
MCV: 85.2 fL (ref 78.0–100.0)
Monocytes Absolute: 0.8 10*3/uL (ref 0.1–1.0)
Monocytes Relative: 7 %
NEUTROS PCT: 76 %
Neutro Abs: 8.7 10*3/uL — ABNORMAL HIGH (ref 1.7–7.7)
Platelets: 133 10*3/uL — ABNORMAL LOW (ref 150–400)
RBC: 4.32 MIL/uL (ref 3.87–5.11)
RDW: 13.9 % (ref 11.5–15.5)
WBC: 11.4 10*3/uL — ABNORMAL HIGH (ref 4.0–10.5)

## 2015-11-03 LAB — POCT CBC
GRANULOCYTE PERCENT: 87.7 % — AB (ref 37–80)
HCT, POC: 36.1 % — AB (ref 37.7–47.9)
Hemoglobin: 12.6 g/dL (ref 12.2–16.2)
Lymph, poc: 1.1 (ref 0.6–3.4)
MCH, POC: 29.6 pg (ref 27–31.2)
MCHC: 34.9 g/dL (ref 31.8–35.4)
MCV: 84.6 fL (ref 80–97)
MID (CBC): 0.4 (ref 0–0.9)
MPV: 7.3 fL (ref 0–99.8)
PLATELET COUNT, POC: 146 10*3/uL (ref 142–424)
POC Granulocyte: 11 — AB (ref 2–6.9)
POC LYMPH %: 9.2 % — AB (ref 10–50)
POC MID %: 3.1 % (ref 0–12)
RBC: 4.27 M/uL (ref 4.04–5.48)
RDW, POC: 14 %
WBC: 12.5 10*3/uL — AB (ref 4.6–10.2)

## 2015-11-03 LAB — PROTIME-INR
INR: 1.06 (ref 0.00–1.49)
Prothrombin Time: 14 seconds (ref 11.6–15.2)

## 2015-11-03 LAB — TROPONIN I: TROPONIN I: 0.17 ng/mL — AB (ref <0.031)

## 2015-11-03 LAB — APTT: aPTT: 20 seconds — ABNORMAL LOW (ref 24–37)

## 2015-11-03 MED ORDER — FUROSEMIDE 20 MG PO TABS: 20.0000 mg | ORAL_TABLET | Freq: Every day | ORAL | Status: DC

## 2015-11-03 MED ORDER — HEPARIN (PORCINE) IN NACL 100-0.45 UNIT/ML-% IJ SOLN
900.0000 [IU]/h | INTRAMUSCULAR | Status: DC
  Filled 2015-11-03: qty 250

## 2015-11-03 MED ORDER — HEPARIN BOLUS VIA INFUSION
1500.0000 [IU] | Freq: Once | INTRAVENOUS | Status: AC
  Administered 2015-11-03: 1500 [IU] via INTRAVENOUS
  Filled 2015-11-03: qty 1500

## 2015-11-03 NOTE — ED Provider Notes (Signed)
CSN: [id number]     Arrival date & time 11/03/15  1747 History   First MD Initiated Contact with Patient 11/03/15 2016     Chief Complaint  Patient presents with  . DVT      HPI Patient was sent in with extensive DVT in her left leg. She said swelling the legs the last few days. Also some swelling in the right leg but not as bad. Has seen at urgent care today and found to have extensive DVT in left leg. No chest pain. She's had oozing for the last few months somewhat diffusely over her body. States she seen her primary doctors have not found a cause. No chest pain. No trouble breathing. No cough. No recent travel. No history of cancer.   Past Medical History  Diagnosis Date  . Asthma   . Cataract   . Allergy   . Hyperlipidemia   . Hypertension   . Osteopenia   . Bursitis of right hip    Past Surgical History  Procedure Laterality Date  . Eye surgery    . Abdominal hysterectomy     History reviewed. No pertinent family history. Social History  Substance Use Topics  . Smoking status: Never Smoker   . Smokeless tobacco: None  . Alcohol Use: No   OB History    No data available     Review of Systems  Constitutional: Negative for appetite change.  Respiratory: Negative for chest tightness and shortness of breath.   Cardiovascular: Positive for leg swelling. Negative for chest pain.  Genitourinary: Negative for dysuria and menstrual problem.  Musculoskeletal: Negative for back pain.  Skin: Negative for wound.  Neurological: Negative for numbness.  Hematological: Bruises/bleeds easily.  Psychiatric/Behavioral: Negative for decreased concentration.      Allergies  Epinephrine and Hydrocodone-acetaminophen  Home Medications   Prior to Admission medications   Medication Sig Start Date End Date Taking? Authorizing Provider  aspirin 81 MG tablet Take 81 mg by mouth daily.   Yes Historical Provider, MD  lisinopril (PRINIVIL,ZESTRIL) 40 MG tablet Take 40 mg by mouth  daily.   Yes Historical Provider, MD  Multiple Vitamins-Minerals (EYE VITAMINS) CAPS Take 1 capsule by mouth daily.   Yes Historical Provider, MD  predniSONE (DELTASONE) 5 MG tablet Take 5 mg by mouth 3 (three) times daily.   Yes Historical Provider, MD  simvastatin (ZOCOR) 10 MG tablet Take 10 mg by mouth daily.   Yes Historical Provider, MD  terazosin (HYTRIN) 5 MG capsule Take 5 mg by mouth at bedtime.   Yes Historical Provider, MD  furosemide (LASIX) 20 MG tablet Take 1 tablet (20 mg total) by mouth daily. 11/03/15   Leonie Douglas, PA-C   BP 142/59 mmHg  Pulse 74  Temp(Src) 98.3 F (36.8 C) (Oral)  Resp 16  SpO2 99% Physical Exam  Constitutional: She appears well-developed.  HENT:  Head: Atraumatic.  Eyes: EOM are normal.  Neck: Neck supple.  Cardiovascular: Normal rate.   Pulmonary/Chest: She has wheezes.  Wheezes on left upper lung field.  Abdominal: Soft.  Musculoskeletal: Normal range of motion. She exhibits edema.  Moderate to severe edema of left lower leg. Edema also on right lower leg but less severe.  Neurological: She is alert.  Skin: Skin is warm.  Multiple areas of ecchymosis throughout both lower extremities and upper extremities. Also on neck.    ED Course  Procedures (including critical care time) Labs Review Labs Reviewed  COMPREHENSIVE METABOLIC PANEL - Abnormal; Notable  for the following:    Glucose, Bld 113 (*)    BUN 55 (*)    Creatinine, Ser 1.62 (*)    Total Protein 5.7 (*)    Albumin 3.4 (*)    GFR calc non Af Amer 27 (*)    GFR calc Af Amer 32 (*)    All other components within normal limits  APTT - Abnormal; Notable for the following:    aPTT 20 (*)    All other components within normal limits  CBC WITH DIFFERENTIAL/PLATELET - Abnormal; Notable for the following:    WBC 11.4 (*)    Platelets 133 (*)    Neutro Abs 8.7 (*)    All other components within normal limits  TROPONIN I - Abnormal; Notable for the following:    Troponin I  0.17 (*)    All other components within normal limits  PROTIME-INR  HEPARIN LEVEL (UNFRACTIONATED)  CBC  TYPE AND SCREEN  ABO/RH    Imaging Review Dg Chest 2 View  11/03/2015  CLINICAL DATA:  Lower extremity edema ; history of hypertension and hyperlipidemia EXAM: CHEST  2 VIEW COMPARISON:  PA and lateral chest x-ray of June 02, 2004. FINDINGS: The lungs remain hyperinflated. There is no focal infiltrate. There is no pleural effusion. The heart and pulmonary vascularity are normal. The mediastinum is normal in width. There is calcification in the wall of the aortic arch. There is a small hiatal hernia. There is multilevel degenerative disc disease and mild dextrocurvature of the thoracic spine. IMPRESSION: 1. COPD-reactive airway disease. There is no evidence of pneumonia nor CHF. 2. Aortic atherosclerosis. 3. Small hiatal hernia. Electronically Signed   By: David  Martinique M.D.   On: 11/03/2015 12:58   US Venous Img Lower Unilateral Left  11/03/2015  CLINICAL DATA:  Left lower extremity bruising and swelling for three days EXAM: LEFT LOWER EXTREMITY VENOUS DOPPLER ULTRASOUND TECHNIQUE: Gray-scale sonography with graded compression, as well as color Doppler and duplex ultrasound were performed to evaluate the lower extremity deep venous systems from the level of the common femoral vein and including the common femoral, femoral, profunda femoral, popliteal and calf veins including the posterior tibial, peroneal and gastrocnemius veins when visible. The superficial great saphenous vein was also interrogated. Spectral Doppler was utilized to evaluate flow at rest and with distal augmentation maneuvers in the common femoral, femoral and popliteal veins. COMPARISON:  None. FINDINGS: Contralateral Common Femoral Vein: Respiratory phasicity is normal and symmetric with the symptomatic side. No evidence of thrombus. Normal compressibility. Common Femoral Vein: Partially occlusive thrombus with decreased  compressibility. Saphenofemoral Junction: Partially occlusive thrombus with decreased compressability. Profunda Femoral Vein: The vein is not compressible and is occluded by thrombus. Femoral Vein: The vein is not compressible and is occluded by thrombus. Popliteal Vein: Partially occlusive thrombus with decreased compressability. Calf Veins:  Not evaluated well due to significant calf edema. Other Findings:  Significant soft tissue edema. IMPRESSION: Thrombus through the left lower extremity deep venous system from common femoral vein through popliteal vein including profunda femoral vein. Electronically Signed   By: Skipper Cliche M.D.   On: 11/03/2015 16:41   I have personally reviewed and evaluated these images and lab results as part of my medical decision-making.   EKG Interpretation   Date/Time:  Friday November 03 2015 22:15:55 EDT Ventricular Rate:  87 PR Interval:    QRS Duration: 95 QT Interval:  369 QTC Calculation: 444 R Axis:   92 Text Interpretation:  Sinus rhythm  Right axis deviation Confirmed by  Alvino Chapel  MD, Ovid Curd [phone]) on 11/03/2015 10:18:14 PM      MDM   Final diagnoses:  DVT (deep vein thrombosis) in pregnancy  DVT (deep venous thrombosis), left  Elevated troponin    Patient with DVT on left sent from outside clinic. Creatinine is mildly up and to high to get a CT venogram of abdomen. Troponin mildly elevated. EKG reassuring. She had a VQ scan done. Discussed with Dr. Lynita Lombard nuclear medicine called in. Admit to stepdown bed. Anticoagulated. Also discussed with the pulmonary service also.   CRITICAL CARE Performed by: Mackie Pai Total critical care time: 30 minutes Critical care time was exclusive of separately billable procedures and treating other patients. Critical care was necessary to treat or prevent imminent or life-threatening deterioration. Critical care was time spent personally by me on the following activities: development of treatment plan  with patient and/or surrogate as well as nursing, discussions with consultants, evaluation of patient's response to treatment, examination of patient, obtaining history from patient or surrogate, ordering and performing treatments and interventions, ordering and review of laboratory studies, ordering and review of radiographic studies, pulse oximetry and re-evaluation of patient's condition.   Davonna Belling, MD 11/04/15 [phone]

## 2015-11-03 NOTE — ED Notes (Signed)
Pt sent from urgent care due to left leg DVT found on ultrasound. Pt went to urgent care due to lower leg swelling. Urgent care is concerned pt also has a blood clot in her right leg. Pt denies shortness of breath/chest pain.

## 2015-11-03 NOTE — H&P (Addendum)
History and Physical    Yolanda [name] [id number] DOB: 1926-01-03 DOA: 11/03/2015  PCP: Criselda Peaches, MD Consultants:  Ophthalmology: Gershon Crane; Dentist: Sheran Spine Patient coming from: home ([place])  Chief Complaint: leg pain  HPI: Yolanda [name] is a 82 y.o. female with medical history significant of asthma, HTN, HLD who presents with a couple of days of left leg discomfort.  Usually does have some size discrepancy so was uncertain whether there was a problem.  Did notice some difficult with ambulating on it.  This Am had more significant swelling.  Called the facility nurse who recommended that she be evaluated.  She went to urgent care and they sent her to [facility] for an ultrasound.  Mild erythema and a bruise on the side of her leg.  Does have h/o increased bruising for years, leaning more on things over the last few months due to hip pain and has noticed more bruising, especially by her elbows. No SOB, cough.  Chronic hip pain from bursitis - changed from APAP/Tramadol to Prednisone TID; pain has been worse in the mornings more recently.  Does have a "pressure point" on her sacrum as well.    ED Course: found to have DVT but also creatinine 1.6 and troponin 0.17.  Since CT is unavailable due to creatinine, decision made to admit to SDU on heparin drip and obtain V/Q scan.  Review of Systems: As per HPI; otherwise 10 point review of systems reviewed and negative.   Ambulatory Status:  Ambulatory with walker  Past Medical History  Diagnosis Date  . Asthma   . Cataract   . Allergy   . Hyperlipidemia   . Hypertension   . Osteopenia   . Bursitis of right hip     Past Surgical History  Procedure Laterality Date  . Eye surgery    . Abdominal hysterectomy      Social History   Social History  . Marital Status: Married    Spouse Name: N/A  . Number of Children: N/A  . Years of Education: N/A   Occupational History  . Not on file.    Social History Main Topics  . Smoking status: Never Smoker   . Smokeless tobacco: Not on file  . Alcohol Use: No  . Drug Use: No  . Sexual Activity: No     Comment: was until a few years ago   Other Topics Concern  . Not on file   Social History Narrative   Lives alone in an retirement community.   Walks with a walker.   NOK: 2 sons with shared POA (they live far away) - Shanon Brow Dintinfass would be first call.       Allergies  Allergen Reactions  . Epinephrine     Unknown.  Marland Kitchen Hydrocodone-Acetaminophen     REACTION: nausea    History reviewed. No pertinent family history.  Prior to Admission medications   Medication Sig Start Date End Date Taking? Authorizing Provider  aspirin 81 MG tablet Take 81 mg by mouth daily.   Yes Historical Provider, MD  lisinopril (PRINIVIL,ZESTRIL) 40 MG tablet Take 40 mg by mouth daily.   Yes Historical Provider, MD  Multiple Vitamins-Minerals (EYE VITAMINS) CAPS Take 1 capsule by mouth daily.   Yes Historical Provider, MD  predniSONE (DELTASONE) 5 MG tablet Take 5 mg by mouth 3 (three) times daily.   Yes Historical Provider, MD  simvastatin (ZOCOR) 10 MG tablet Take 10 mg by mouth daily.  Yes Historical Provider, MD  terazosin (HYTRIN) 5 MG capsule Take 5 mg by mouth at bedtime.   Yes Historical Provider, MD  furosemide (LASIX) 20 MG tablet Take 1 tablet (20 mg total) by mouth daily. 11/03/15   Leonie Douglas, PA-C    Physical Exam: Filed Vitals:   11/03/15 2100 11/03/15 2200 11/03/15 2230 11/04/15 0122  BP: 143/56 146/69 142/59 126/54  Pulse: 73 80 74 84  Temp:      TempSrc:      Resp: 23 23 16 19   SpO2: 97% 98% 99% 96%     General:  Appears calm and comfortable and is NAD Eyes:  PERRL, EOMI, normal lids, iris ENT:  grossly normal hearing, lips & tongue, mmm Neck:  no LAD, masses or thyromegaly Cardiovascular:  RRR, no m/r/g. No LE edema.  Respiratory:  CTA bilaterally, no w/r/r. Normal respiratory effort. Abdomen:  soft,  ntnd, NABS Skin: marked venous stasis with left calf much larger than right, no erythema/warmth Musculoskeletal:  grossly normal tone BUE/BLE, good ROM, no bony abnormality Psychiatric:  grossly normal mood and affect, speech fluent and appropriate, AOx3 Neurologic:  CN 2-12 grossly intact, moves all extremities in coordinated fashion, sensation intact  Labs on Admission: I have personally reviewed following labs and imaging studies  CBC:  Recent Labs Lab 11/03/15 1228 11/03/15 2054  WBC 12.5* 11.4*  NEUTROABS  --  8.7*  HGB 12.6 12.4  HCT 36.1* 36.8  MCV 84.6 85.2  PLT  --  [id number]*   Basic Metabolic Panel:  Recent Labs Lab 11/03/15 1226 11/03/15 2054  NA 138 139  K 4.8 4.1  CL 102 104  CO2 24 25  GLUCOSE 148* 113*  BUN 56* 55*  CREATININE 1.63* 1.62*  CALCIUM 9.9 10.0   GFR: Estimated Creatinine Clearance: 21.8 mL/min (by C-G formula based on Cr of 1.62). Liver Function Tests:  Recent Labs Lab 11/03/15 1226 11/03/15 2054  AST 20 25  ALT 18 21  ALKPHOS 44 43  BILITOT 0.8 1.2  PROT 6.0* 5.7*  ALBUMIN 3.6 3.4*   No results for input(s): LIPASE, AMYLASE in the last 168 hours. No results for input(s): AMMONIA in the last 168 hours. Coagulation Profile:  Recent Labs Lab 11/03/15 2054  INR 1.06   Cardiac Enzymes:  Recent Labs Lab 11/03/15 2054  TROPONINI 0.17*   BNP (last 3 results) No results for input(s): PROBNP in the last 8760 hours. HbA1C: No results for input(s): HGBA1C in the last 72 hours. CBG: No results for input(s): GLUCAP in the last 168 hours. Lipid Profile: No results for input(s): CHOL, HDL, LDLCALC, TRIG, CHOLHDL, LDLDIRECT in the last 72 hours. Thyroid Function Tests: No results for input(s): TSH, T4TOTAL, FREET4, T3FREE, THYROIDAB in the last 72 hours. Anemia Panel: No results for input(s): VITAMINB12, FOLATE, FERRITIN, TIBC, IRON, RETICCTPCT in the last 72 hours. Urine analysis: No results found for: COLORURINE, APPEARANCEUR,  LABSPEC, PHURINE, GLUCOSEU, HGBUR, BILIRUBINUR, KETONESUR, PROTEINUR, UROBILINOGEN, NITRITE, LEUKOCYTESUR  Creatinine Clearance: Estimated Creatinine Clearance: 21.8 mL/min (by C-G formula based on Cr of 1.62).  Sepsis Labs: @LABRCNTIP (procalcitonin:4,lacticidven:4) )No results found for this or any previous visit (from the past 240 hour(s)).   Radiological Exams on Admission: Dg Chest 2 View  11/03/2015  CLINICAL DATA:  Lower extremity edema ; history of hypertension and hyperlipidemia EXAM: CHEST  2 VIEW COMPARISON:  PA and lateral chest x-ray of June 02, 2004. FINDINGS: The lungs remain hyperinflated. There is no focal infiltrate. There is no pleural effusion.  The heart and pulmonary vascularity are normal. The mediastinum is normal in width. There is calcification in the wall of the aortic arch. There is a small hiatal hernia. There is multilevel degenerative disc disease and mild dextrocurvature of the thoracic spine. IMPRESSION: 1. COPD-reactive airway disease. There is no evidence of pneumonia nor CHF. 2. Aortic atherosclerosis. 3. Small hiatal hernia. Electronically Signed   By: David  Martinique M.D.   On: 11/03/2015 12:58   Nm Pulmonary Perf And Vent  11/04/2015  CLINICAL DATA:  82 year old female with history of DVT. No chest pain or shortness of breath EXAM: NUCLEAR MEDICINE VENTILATION - PERFUSION LUNG SCAN TECHNIQUE: Ventilation images were obtained in multiple projections using inhaled aerosol Tc-40m DTPA. Perfusion images were obtained in multiple projections after intravenous injection of Tc-34m MAA. RADIOPHARMACEUTICALS:  31.8 mCi Technetium-39m DTPA aerosol inhalation and 4.1 mCi Technetium-54m MAA IV COMPARISON:  Chest radiograph dated 11/03/2015 FINDINGS: Ventilation: No focal ventilation defect. Perfusion: No wedge shaped peripheral perfusion defects to suggest acute pulmonary embolism. IMPRESSION: Low probability for pulmonary embolism. Electronically Signed   By: Anner Crete  M.D.   On: 11/04/2015 01:30   US Venous Img Lower Unilateral Left  11/03/2015  CLINICAL DATA:  Left lower extremity bruising and swelling for three days EXAM: LEFT LOWER EXTREMITY VENOUS DOPPLER ULTRASOUND TECHNIQUE: Gray-scale sonography with graded compression, as well as color Doppler and duplex ultrasound were performed to evaluate the lower extremity deep venous systems from the level of the common femoral vein and including the common femoral, femoral, profunda femoral, popliteal and calf veins including the posterior tibial, peroneal and gastrocnemius veins when visible. The superficial great saphenous vein was also interrogated. Spectral Doppler was utilized to evaluate flow at rest and with distal augmentation maneuvers in the common femoral, femoral and popliteal veins. COMPARISON:  None. FINDINGS: Contralateral Common Femoral Vein: Respiratory phasicity is normal and symmetric with the symptomatic side. No evidence of thrombus. Normal compressibility. Common Femoral Vein: Partially occlusive thrombus with decreased compressibility. Saphenofemoral Junction: Partially occlusive thrombus with decreased compressability. Profunda Femoral Vein: The vein is not compressible and is occluded by thrombus. Femoral Vein: The vein is not compressible and is occluded by thrombus. Popliteal Vein: Partially occlusive thrombus with decreased compressability. Calf Veins:  Not evaluated well due to significant calf edema. Other Findings:  Significant soft tissue edema. IMPRESSION: Thrombus through the left lower extremity deep venous system from common femoral vein through popliteal vein including profunda femoral vein. Electronically Signed   By: Skipper Cliche M.D.   On: 11/03/2015 16:41    EKG: Independently reviewed.  NSR  with no evidence of acute ischemia  Assessment/Plan Principal Problem:   DVT (deep venous thrombosis), left Active Problems:   Essential hypertension   Hyperlipidemia   Bursitis of  right hip   Troponin level elevated   Stasis edema of both lower extremities   Creatinine elevation   1. DVT - Patient with clear DVT and will need anticoagulation now and for at least 3 months.  Likely etiology is poor mobility associated with persistent hip bursitis.  Will admit to SDU with heparin drip.  PE is also possible, but the patient has no respiratory symptoms.  Regardless of if PE is present, treatment will be unchanged.  With elevated creatinine, no CTPA and so will order V/Q scan (low probability for PE).  Will also order RLE Doppler and TTE.  2. Elevated troponin - EKG without evidence of ischemia and the patient has no c/o chest pain.  Likely Type II NSTEMI associated with strain.  Will trend troponins and provide 81 mg ASA daily; otherwise no current intervention is needed.  3. HTN - Continue Hytrin (unusual choice), hold lisinopril due to renal dysfunction (no baseline available at this time).  4. HLD - Continue Zocor.  5. Bursitis - likely etiology of DVT due to immobility.  Currently denies pain but will give low-dose morphine for pain as needed.  6. Stasis - chronic and bilateral.  No intervention at this time.  7. Elevated creatinine - no baseline available so uncertain if acute vs. Chronic.  Will follow.  Given her age, only gentle hydration as needed.  DVT prophylaxis: Heparin drip Code Status:  DNR - confirmed with patient Family Communication: I called and spoke with her son, Yolanda [name], [phone]  Disposition Plan: Back to her retirement home once clinically improved unless rehab is needed Consults called: pulmonology called by ER physician Admission status: admit to SDU   Karmen Bongo MD [facility]  If 7PM-7AM, please contact night-coverage [email or web address] Password [email or web address]  11/04/2015, 1:46 AM

## 2015-11-03 NOTE — Patient Instructions (Addendum)
Until you see your PCP please do the following below:  Hold aspirin 81 mg tablet.  Decrease prednisone from 5mg  three times a day to prednisone 5mg  one time a day.  Begin Lasix 20mg  tomorrow morning (Saturday) and take one tablet each day until you see your PCP.  Follow up with PCP this coming Monday (6/26) at 3:45pm  for further evaluation. (we already scheduled this for you)  We have also scheduled you for an ultrasound of your left lower extremity for 3:30pm today at [facility]. Address: [address] [place], [place] [address]  IF you received an x-ray today, you will receive an invoice from [facility]. Please contact [facility] at [phone] with questions or concerns regarding your invoice.   IF you received labwork today, you will receive an invoice from [employer]. Please contact [facility] at [phone] with questions or concerns regarding your invoice.   Our billing staff will not be able to assist you with questions regarding bills from these companies.  You will be contacted with the lab results as soon as they are available. The fastest way to get your results is to activate your My Chart account. Instructions are located on the last page of this paperwork. If you have not heard from [place] regarding the results in 2 weeks, please contact this office.

## 2015-11-03 NOTE — Progress Notes (Signed)
[facility] Progress Note Patient Name: Yolanda [name] DOB: November 14, 1925 MRN: [id number]   Date of Service  11/03/2015  HPI/Events of Note  Case discussed at length with Dr. Alvino Chapel in the ED at [facility]. Patient with DVT. Reportedly has extensive bruising. The patient's serum creatinine 1.6. Troponin I elevated at 0.17. However, patient is normotensive, not tachycardic, and saturating 99-100% on room air. Given elevated creatinine unable to obtain CT angiogram of the chest. Dr. Alvino Chapel plans on obtaining VQ scan and proceeding with systemic anticoagulation via heparin per pharmacy protocol. Plans for admission to stepdown unit with close monitoring. Checking right lower extremity for DVT with venous duplex. Plans to obtain transthoracic echocardiogram to evaluate RV function first thing in the morning.   eICU Interventions  I agree with this course of action. PCCM will remain available in case the patient's clinical condition deteriorates.      Intervention Category Evaluation Type: New Patient Evaluation  Tera Partridge 11/03/2015, 10:14 PM

## 2015-11-03 NOTE — Progress Notes (Signed)
ANTICOAGULATION CONSULT NOTE - Initial Consult  Pharmacy Consult for Heparin Indication: DVT  Allergies  Allergen Reactions  . Epinephrine     Unknown.  Marland Kitchen Hydrocodone-Acetaminophen     REACTION: nausea    Patient Measurements:   Heparin Dosing Weight:   Vital Signs: Temp: 98.3 F (36.8 C) (06/23 1821) Temp Source: Oral (06/23 1821) BP: 146/70 mmHg (06/23 2030) Pulse Rate: 81 (06/23 2030)  Labs:  Recent Labs  11/03/15 1226 11/03/15 1228 11/03/15 2054  HGB  --  12.6 12.4  HCT  --  36.1* 36.8  PLT  --   --  133*  APTT  --   --  20*  LABPROT  --   --  14.0  INR  --   --  1.06  CREATININE 1.63*  --  1.62*  TROPONINI  --   --  0.17*    Estimated Creatinine Clearance: 21.8 mL/min (by C-G formula based on Cr of 1.62).   Medical History: Past Medical History  Diagnosis Date  . Asthma   . Cataract   . Allergy   . Hyperlipidemia   . Hypertension   . Osteopenia     Medications:   (Not in a hospital admission) Scheduled:  . heparin  1,500 Units Intravenous Once    Assessment: Patient with new DVT, on left leg, seen on ultrasound from urgent care.  Concern for DVT on right leg as well. Baseline PTT/PT/INR not elevated.  Goal of Therapy:  Heparin level 0.3-0.7 units/ml Monitor platelets by anticoagulation protocol: Yes   Plan:  Heparin bolus 1500 units iv x1 Heparin drip at 900 units/hr Daily CBC Next heparin level at 0800 6/24    Tyler Deis, Shea Stakes Crowford 11/03/2015,10:41 PM

## 2015-11-03 NOTE — ED Notes (Signed)
Pt's contact:  Mickel Baas and Janene Harvey (friends at [facility])--- tel# [phone]

## 2015-11-03 NOTE — Progress Notes (Signed)
Yolanda [name]  MRN: [id number] DOB: 05-26-1925  Subjective:  Pt is an 82 yo female with PMH of bursitis of R hip, HTN, HLD, who presents to clinic swelling of left lower leg. Started 3-4 days ago. Pt has also noticed more bruising on her lower legs the past 3 days. Pt reports no pain in lower legs, no risk factors for DVT such as recent hospitalization, immobilization, hormone use, trauma to leg, FH of VTE, or prior malignancy. Pt has not had any CP or SOB. No PMH or FH of clotting or bleeding disorders. Positive for left leg stiffness and decreased mobility. Only recent change in medicine is pt has currently been taking prednisone 5mg  three times a day since April 17,2017 for R hip bursitis.     Patient Active Problem List   Diagnosis Date Noted  . Essential hypertension 11/03/2015  . COLONIC POLYPS 03/26/2005  . DIVERTICULOSIS, COLON 03/26/2005  . ANGIODYSPLASIA, INTESTINE, WITH HEMORRHAGE 03/26/2005    No current outpatient prescriptions on file prior to visit.   No current facility-administered medications on file prior to visit.    Allergies  Allergen Reactions  . Epinephrine   . Hydrocodone-Acetaminophen     REACTION: nausea    Review of Systems  Constitutional: Positive for fatigue. Negative for fever, chills and diaphoresis.  Respiratory: Negative for chest tightness and shortness of breath.   Cardiovascular: Positive for leg swelling. Negative for chest pain and palpitations.  Musculoskeletal:       Left lower leg feels slightly numb and redness   Neurological: Negative for dizziness and headaches.   Objective:  BP 128/60 mmHg  Pulse 113  Temp(Src) 98.3 F (36.8 C) (Oral)  Resp 19  Ht 5\' 3"  (1.6 m)  Wt 144 lb 6.4 oz (65.499 kg)  BMI 25.59 kg/m2  SpO2 97%  Physical Exam  Constitutional: She is oriented to person, place, and time and well-developed, well-nourished, and in no distress.  HENT:  Head: Normocephalic and atraumatic.  Eyes: Conjunctivae are  normal. Pupils are equal, round, and reactive to light.  Neck: Normal range of motion.  Cardiovascular: Normal rate, regular rhythm and normal heart sounds.   Pulmonary/Chest: Effort normal and breath sounds normal.  Abdominal: Soft.  Musculoskeletal: Normal range of motion.       Right lower leg: She exhibits tenderness (posteriorly ).       Left lower leg: She exhibits tenderness (posteriorly).  R calf size is 36cm L calf size is 41 cm   Neurological: She is alert and oriented to person, place, and time.  Skin: Skin is warm and dry.  1+ pitting edema on right leg involves lateral foot and up 2/3 of shins, no toe involvment, 3+ pitting edema on left leg involves toes, foot, and 2/3 shins.   Multiple ecchymotic areas in varying sizes on chest, arms, legs, and feet. Increased warmth over the ecchymotic area on the left lateral leg.  Brownish discoloration of anterior lower limbs bilateral consistent with PVD changes.      Psychiatric: Affect normal.    Results for orders placed or performed in visit on 11/03/15  POCT CBC  Result Value Ref Range   WBC 12.5 (A) 4.6 - 10.2 K/uL   Lymph, poc 1.1 0.6 - 3.4   POC LYMPH PERCENT 9.2 (A) 10 - 50 %L   MID (cbc) 0.4 0 - 0.9   POC MID % 3.1 0 - 12 %M   POC Granulocyte 11.0 (A) 2 - 6.9  Granulocyte percent 87.7 (A) 37 - 80 %G   RBC 4.27 4.04 - 5.48 M/uL   Hemoglobin 12.6 12.2 - 16.2 g/dL   HCT, POC 36.1 (A) 37.7 - 47.9 %   MCV 84.6 80 - 97 fL   MCH, POC 29.6 27 - 31.2 pg   MCHC 34.9 31.8 - 35.4 g/dL   RDW, POC 14.0 %   Platelet Count, POC 146 142 - 424 K/uL   MPV 7.3 0 - 99.8 fL    Dg Chest 2 View  11/03/2015  CLINICAL DATA:  Lower extremity edema ; history of hypertension and hyperlipidemia EXAM: CHEST  2 VIEW COMPARISON:  PA and lateral chest x-ray of June 02, 2004. FINDINGS: The lungs remain hyperinflated. There is no focal infiltrate. There is no pleural effusion. The heart and pulmonary vascularity are normal. The mediastinum  is normal in width. There is calcification in the wall of the aortic arch. There is a small hiatal hernia. There is multilevel degenerative disc disease and mild dextrocurvature of the thoracic spine. IMPRESSION: 1. COPD-reactive airway disease. There is no evidence of pneumonia nor CHF. 2. Aortic atherosclerosis. 3. Small hiatal hernia. Electronically Signed   By: David  Martinique M.D.   On: 11/03/2015 12:58   US Venous Img Lower Unilateral Left  11/03/2015  CLINICAL DATA:  Left lower extremity bruising and swelling for three days EXAM: LEFT LOWER EXTREMITY VENOUS DOPPLER ULTRASOUND TECHNIQUE: Gray-scale sonography with graded compression, as well as color Doppler and duplex ultrasound were performed to evaluate the lower extremity deep venous systems from the level of the common femoral vein and including the common femoral, femoral, profunda femoral, popliteal and calf veins including the posterior tibial, peroneal and gastrocnemius veins when visible. The superficial great saphenous vein was also interrogated. Spectral Doppler was utilized to evaluate flow at rest and with distal augmentation maneuvers in the common femoral, femoral and popliteal veins. COMPARISON:  None. FINDINGS: Contralateral Common Femoral Vein: Respiratory phasicity is normal and symmetric with the symptomatic side. No evidence of thrombus. Normal compressibility. Common Femoral Vein: Partially occlusive thrombus with decreased compressibility. Saphenofemoral Junction: Partially occlusive thrombus with decreased compressability. Profunda Femoral Vein: The vein is not compressible and is occluded by thrombus. Femoral Vein: The vein is not compressible and is occluded by thrombus. Popliteal Vein: Partially occlusive thrombus with decreased compressability. Calf Veins:  Not evaluated well due to significant calf edema. Other Findings:  Significant soft tissue edema. IMPRESSION: Thrombus through the left lower extremity deep venous system from  common femoral vein through popliteal vein including profunda femoral vein. Electronically Signed   By: Skipper Cliche M.D.   On: 11/03/2015 16:41      Assessment and Plan :   1. Swelling of left lower extremity -Left lower leg measures 41 cm. There is minimal tenderness along posterior aspect. Warmth palpated over ecchymosis on lateral aspect of lower extremity.  - POCT CBC shows WBC of 12.5  - US Venous Img Lower Unilateral Left; Future: scheduled for today at 3:30pm at [facility]: shows Thrombus through the left lower extremity deep venous system from common femoral vein through popliteal vein including profunda femoral vein. -Pt was given lasix in the office prior to [place] results. After results received, pt was instructed not to use lasix until evaluated in the ER.   2. Bilateral edema of lower extremity -Left lower leg measures 41cm. Right lower leg measures 36cm. Both legs have pitting edema. There is 3+ pitting edema in the  left lower extremity and 1+ pitting edema in right lower extremity. Labs below ordered to r/o CHF. - Brain natriuretic peptide - DG Chest 2 View - EKG 12-Lead NSR, rate is 76bpm - COMPLETE METABOLIC PANEL WITH GFR  3. Essential hypertension -Controlled on lisinopril and terazosin  4. Ecchymosis  -Patient has diffuse ecchymosis on chest, arms, legs, and feet. Does not recall any injuries to areas. Is not currently on a blood thinner. Is taking 81mg  asa daily for preventative health. Has no PMH of FH of bleeding or clotting disorders. -Hgb is 12.6, Platelet count is 146.  -Plan is to discontinue asa 81mg  and follow up with PCP for further heme work up.    Discussed with pt results of lower extremity doppler of left leg. Sent pt by private vehicle to [facility]  for admission/evaluation. Concerned due to pt living alone,size of blood clot, multiple bruising, clotting issues, and the fact that due to minimal risk factors for DVT (i.e., age>65),  that the swelling of the right lower leg may also have a clot. Triaged nurse at [facility] ER was notified that pt was arriving by private vehicle. Also triage nurse, Catalina Antigua, was notified of our concern of patient needing hospital admission.   Tenna Delaine PA-C  [facility] 11/03/2015 5:26 PM

## 2015-11-04 ENCOUNTER — Inpatient Hospital Stay (HOSPITAL_COMMUNITY): Payer: Medicare Other

## 2015-11-04 ENCOUNTER — Other Ambulatory Visit (HOSPITAL_COMMUNITY): Payer: Medicare Other

## 2015-11-04 DIAGNOSIS — D62 Acute posthemorrhagic anemia: Secondary | ICD-10-CM | POA: Diagnosis not present

## 2015-11-04 DIAGNOSIS — I10 Essential (primary) hypertension: Secondary | ICD-10-CM

## 2015-11-04 DIAGNOSIS — N183 Chronic kidney disease, stage 3 unspecified: Secondary | ICD-10-CM | POA: Diagnosis present

## 2015-11-04 DIAGNOSIS — I82491 Acute embolism and thrombosis of other specified deep vein of right lower extremity: Secondary | ICD-10-CM

## 2015-11-04 DIAGNOSIS — I87303 Chronic venous hypertension (idiopathic) without complications of bilateral lower extremity: Secondary | ICD-10-CM

## 2015-11-04 DIAGNOSIS — R7989 Other specified abnormal findings of blood chemistry: Secondary | ICD-10-CM

## 2015-11-04 DIAGNOSIS — I82402 Acute embolism and thrombosis of unspecified deep veins of left lower extremity: Secondary | ICD-10-CM

## 2015-11-04 DIAGNOSIS — M7071 Other bursitis of hip, right hip: Secondary | ICD-10-CM | POA: Diagnosis not present

## 2015-11-04 DIAGNOSIS — Q2733 Arteriovenous malformation of digestive system vessel: Secondary | ICD-10-CM | POA: Diagnosis not present

## 2015-11-04 DIAGNOSIS — R778 Other specified abnormalities of plasma proteins: Secondary | ICD-10-CM | POA: Diagnosis present

## 2015-11-04 DIAGNOSIS — E785 Hyperlipidemia, unspecified: Secondary | ICD-10-CM | POA: Diagnosis present

## 2015-11-04 DIAGNOSIS — Z0389 Encounter for observation for other suspected diseases and conditions ruled out: Secondary | ICD-10-CM | POA: Diagnosis not present

## 2015-11-04 HISTORY — DX: Chronic venous hypertension (idiopathic) without complications of bilateral lower extremity: I87.303

## 2015-11-04 HISTORY — DX: Chronic kidney disease, stage 3 unspecified: N18.30

## 2015-11-04 LAB — BASIC METABOLIC PANEL
ANION GAP: 7 (ref 5–15)
BUN: 54 mg/dL — AB (ref 6–20)
CHLORIDE: 108 mmol/L (ref 101–111)
CO2: 25 mmol/L (ref 22–32)
Calcium: 9 mg/dL (ref 8.9–10.3)
Creatinine, Ser: 1.46 mg/dL — ABNORMAL HIGH (ref 0.44–1.00)
GFR calc Af Amer: 36 mL/min — ABNORMAL LOW (ref >60)
GFR, EST NON AFRICAN AMERICAN: 31 mL/min — AB (ref >60)
GLUCOSE: 87 mg/dL (ref 65–99)
POTASSIUM: 3.8 mmol/L (ref 3.5–5.1)
Sodium: 140 mmol/L (ref 135–145)

## 2015-11-04 LAB — I-STAT TROPONIN, ED
TROPONIN I, POC: 0.18 ng/mL — AB (ref 0.00–0.08)
Troponin i, poc: 0.16 ng/mL (ref 0.00–0.08)

## 2015-11-04 LAB — CBC
HEMATOCRIT: 31.3 % — AB (ref 36.0–46.0)
HEMOGLOBIN: 10.6 g/dL — AB (ref 12.0–15.0)
MCH: 28.6 pg (ref 26.0–34.0)
MCHC: 33.9 g/dL (ref 30.0–36.0)
MCV: 84.4 fL (ref 78.0–100.0)
PLATELETS: 124 10*3/uL — AB (ref 150–400)
RBC: 3.71 MIL/uL — AB (ref 3.87–5.11)
RDW: 14 % (ref 11.5–15.5)
WBC: 8.5 10*3/uL (ref 4.0–10.5)

## 2015-11-04 LAB — TYPE AND SCREEN
ABO/RH(D): A POS
ANTIBODY SCREEN: NEGATIVE

## 2015-11-04 LAB — PROTIME-INR
INR: 1.09 (ref 0.00–1.49)
PROTHROMBIN TIME: 14.3 s (ref 11.6–15.2)

## 2015-11-04 LAB — ABO/RH: ABO/RH(D): A POS

## 2015-11-04 LAB — MRSA PCR SCREENING: MRSA by PCR: NEGATIVE

## 2015-11-04 LAB — HEPARIN LEVEL (UNFRACTIONATED)
Heparin Unfractionated: 0.82 IU/mL — ABNORMAL HIGH (ref 0.30–0.70)
Heparin Unfractionated: 0.7 IU/mL (ref 0.30–0.70)

## 2015-11-04 LAB — BRAIN NATRIURETIC PEPTIDE: Brain Natriuretic Peptide: 70.1 pg/mL (ref <100)

## 2015-11-04 MED ORDER — ACETAMINOPHEN 650 MG RE SUPP: 650.0000 mg | Freq: Four times a day (QID) | RECTAL | Status: DC | PRN

## 2015-11-04 MED ORDER — SIMVASTATIN 20 MG PO TABS
10.0000 mg | ORAL_TABLET | Freq: Every day | ORAL | Status: DC
  Administered 2015-11-04 – 2015-11-06 (×3): 10 mg via ORAL
  Filled 2015-11-04 (×3): qty 1

## 2015-11-04 MED ORDER — ONDANSETRON HCL 4 MG/2ML IJ SOLN: 4.0000 mg | Freq: Four times a day (QID) | INTRAMUSCULAR | Status: DC | PRN

## 2015-11-04 MED ORDER — FAMOTIDINE 20 MG PO TABS
40.0000 mg | ORAL_TABLET | Freq: Every day | ORAL | Status: DC
  Administered 2015-11-04 – 2015-11-07 (×4): 40 mg via ORAL
  Filled 2015-11-04 (×4): qty 2

## 2015-11-04 MED ORDER — LISINOPRIL 20 MG PO TABS
40.0000 mg | ORAL_TABLET | Freq: Every day | ORAL | Status: DC
  Filled 2015-11-04: qty 2

## 2015-11-04 MED ORDER — ACETAMINOPHEN 325 MG PO TABS: 650.0000 mg | ORAL_TABLET | Freq: Four times a day (QID) | ORAL | Status: DC | PRN

## 2015-11-04 MED ORDER — TECHNETIUM TC 99M DIETHYLENETRIAME-PENTAACETIC ACID
31.8000 | Freq: Once | INTRAVENOUS | Status: DC | PRN
Start: 2015-11-04 — End: 2015-11-07

## 2015-11-04 MED ORDER — SODIUM CHLORIDE 0.9% FLUSH
3.0000 mL | Freq: Two times a day (BID) | INTRAVENOUS | Status: DC
  Administered 2015-11-04 – 2015-11-07 (×4): 3 mL via INTRAVENOUS

## 2015-11-04 MED ORDER — TERAZOSIN HCL 5 MG PO CAPS
5.0000 mg | ORAL_CAPSULE | Freq: Every day | ORAL | Status: DC
  Administered 2015-11-04 – 2015-11-06 (×4): 5 mg via ORAL
  Filled 2015-11-04 (×6): qty 1

## 2015-11-04 MED ORDER — FUROSEMIDE 20 MG PO TABS
20.0000 mg | ORAL_TABLET | Freq: Every day | ORAL | Status: DC
  Administered 2015-11-04 – 2015-11-07 (×4): 20 mg via ORAL
  Filled 2015-11-04 (×4): qty 1

## 2015-11-04 MED ORDER — LIP MEDEX EX OINT
TOPICAL_OINTMENT | CUTANEOUS | Status: AC
  Administered 2015-11-04: 06:00:00
  Filled 2015-11-04: qty 7

## 2015-11-04 MED ORDER — MORPHINE SULFATE (PF) 2 MG/ML IV SOLN
2.0000 mg | INTRAVENOUS | Status: DC | PRN
  Administered 2015-11-04: 2 mg via INTRAVENOUS
  Filled 2015-11-04: qty 1

## 2015-11-04 MED ORDER — ASPIRIN EC 81 MG PO TBEC
81.0000 mg | DELAYED_RELEASE_TABLET | Freq: Every day | ORAL | Status: DC
  Administered 2015-11-04 – 2015-11-06 (×3): 81 mg via ORAL
  Filled 2015-11-04 (×4): qty 1

## 2015-11-04 MED ORDER — PREDNISONE 5 MG PO TABS
5.0000 mg | ORAL_TABLET | Freq: Three times a day (TID) | ORAL | Status: DC
  Administered 2015-11-04 – 2015-11-07 (×10): 5 mg via ORAL
  Filled 2015-11-04 (×10): qty 1

## 2015-11-04 MED ORDER — TECHNETIUM TO 99M ALBUMIN AGGREGATED
4.1000 | Freq: Once | INTRAVENOUS | Status: AC | PRN
Start: 2015-11-04 — End: 2015-11-04
  Administered 2015-11-04: 4 via INTRAVENOUS

## 2015-11-04 MED ORDER — ONDANSETRON HCL 4 MG PO TABS: 4.0000 mg | ORAL_TABLET | Freq: Four times a day (QID) | ORAL | Status: DC | PRN

## 2015-11-04 MED ORDER — HEPARIN (PORCINE) IN NACL 100-0.45 UNIT/ML-% IJ SOLN
600.0000 [IU]/h | INTRAMUSCULAR | Status: DC
  Administered 2015-11-05: 600 [IU]/h via INTRAVENOUS
  Filled 2015-11-04 (×2): qty 250

## 2015-11-04 NOTE — Progress Notes (Signed)
VASCULAR LAB PRELIMINARY  PRELIMINARY  PRELIMINARY  PRELIMINARY  Right lower extremity venous duplex has been completed.     Right:  DVT noted in the popliteal vein and the peroneal vein.  No evidence of superficial thrombosis.  No Baker's cyst.  Gave result to Shanon Brow, RN  Janifer Adie, RVT, RDMS 11/04/2015, 9:30 AM

## 2015-11-04 NOTE — Progress Notes (Signed)
PT Cancellation Note  Patient Details Name: Yolanda [name] MRN: [id number] DOB: 1925/07/15   Cancelled Treatment:    Reason Eval/Treat Not Completed: Medical issues which prohibited therapy (BP85/39, new DVT  R leg and left also. On Heparin. will check back tomorrow.)   Claretha Cooper 11/04/2015, 9:25 AM Tresa Endo PT [phone]

## 2015-11-04 NOTE — Progress Notes (Signed)
ANTICOAGULATION CONSULT NOTE - Initial Consult  Pharmacy Consult for Heparin Indication: DVT  Allergies  Allergen Reactions  . Epinephrine     Unknown.  Marland Kitchen Hydrocodone-Acetaminophen     REACTION: nausea   Patient Measurements: Height: 5\' 3"  (160 cm) Weight: 140 lb 14 oz (63.9 kg) IBW/kg (Calculated) : 52.4  Vital Signs: Temp: 97.4 F (36.3 C) (06/24 0800) Temp Source: Oral (06/24 0800) BP: 122/37 mmHg (06/24 0600) Pulse Rate: 83 (06/24 0600)  Labs:  Recent Labs  11/03/15 1226 11/03/15 1228 11/03/15 2054 11/04/15 0452 11/04/15 0823  HGB  --  12.6 12.4 10.6*  --   HCT  --  36.1* 36.8 31.3*  --   PLT  --   --  133* 124*  --   APTT  --   --  20*  --   --   LABPROT  --   --  14.0 14.3  --   INR  --   --  1.06 1.09  --   HEPARINUNFRC  --   --   --   --  0.82*  CREATININE 1.63*  --  1.62* 1.46*  --   TROPONINI  --   --  0.17*  --   --    Estimated Creatinine Clearance: 24 mL/min (by C-G formula based on Cr of 1.46).  Medications:  Scheduled:  . aspirin EC  81 mg Oral Daily  . famotidine  40 mg Oral Daily  . predniSONE  5 mg Oral TID  . simvastatin  10 mg Oral q1800  . sodium chloride flush  3 mL Intravenous Q12H  . terazosin  5 mg Oral QHS   Assessment: Patient with new LLE DVT, seen on ultrasound from urgent care. To ED with concern for RLE DVT- confirmed by doppler at [facility].  VQ scan low probability for PE (no CT with renal insufficiency).  Baseline PTT/PT/INR not elevated. No anti-coagulants PTA  Renal insufficiency, hx of CKD  Heparin bolus 1500 units iv x1, Heparin drip at 900 units/hr 6/23 at 2300  Today, 11/04/2015  1st Hep level slightly above therapeutic range at 0.82  Goal of Therapy:  Heparin level 0.3-0.7 units/ml Monitor platelets by anticoagulation protocol: Yes   Plan:   Reduce Heparin infusion to 700 units/hr  Recheck 2nd level in 8 hr  Daily CBC, will not order daily Heparin level  Will d/c daily PT/INR  Plan transition to Apixaban  6/25 am  Minda Ditto PharmD Pager [phone] 11/04/2015, 9:47 AM

## 2015-11-04 NOTE — Care Management Note (Signed)
Case Management Note  Patient Details  Name: DORENA LAZZARA MRN: [id number] Date of Birth: [birth year]  Subjective/Objective:    lower extremity DVT                Action/Plan: Discharge Planning:   Scheduled dc home on Eliquis or Lovenox. Message to attending to clarify medication.   Expected Discharge Date:                  Expected Discharge Plan:  Home/Self Care  In-House Referral:  NA  Discharge planning Services  CM Consult  Post Acute Care Choice:  NA Choice offered to:  NA  DME Arranged:  N/A DME Agency:  NA  HH Arranged:  NA HH Agency:  NA  Status of Service:  In process, will continue to follow  If discussed at Long Length of Stay Meetings, dates discussed:    Additional Comments:  Erenest Rasher, RN 11/04/2015, 3:55 PM

## 2015-11-04 NOTE — Progress Notes (Addendum)
[facility] PROGRESS NOTE    Progress Note  Yolanda [name]  [id number] DOB: February 26, 1926 DOA: 11/03/2015 PCP: Criselda Peaches, MD     Brief Narrative:   Yolanda [name] is an 82 y.o. female past history of asthma who presents with a couple of days of leg discomfort and discrepancy inside of her legs was found to have a lower extremity DVT.  Assessment/Plan:   DVT (deep venous thrombosis), left Currently on heparin, will transition her to Eluquis, tomorrow morning. Her GFR is greater than 30 mL/min. VQ scan low probability, with a physical therapy consult, 2-D echo is pending due to her elevated troponin, unlikely that she has right heart strain as she is not hypotensive, not tachycardic and with a low probability VQ scan unlikely a PE.  Essential hypertension Antihypertensive medications were held.  Hyperlipidemia: Continue statins.  Bursitis of right hip Continue narcotics and steroids for pain. Her on a H2 blockers get physical therapy to see her.  Troponin level elevated Cardiac biomarkers mildly elevated flat in the setting of chronic renal disease, no complains of chest pain or shortness of breath.  Stasis edema of both lower extremities  CKD stage III: At baseline  DVT prophylaxis: heparin Family Communication:none Disposition Plan/Barrier to D/C: home in 2-3 days Code Status:     Code Status Orders        Start     Ordered   11/04/15 0128  Do not attempt resuscitation (DNR)   Continuous    Question Answer Comment  In the event of cardiac or respiratory ARREST Do not call a "code blue"   In the event of cardiac or respiratory ARREST Do not perform Intubation, CPR, defibrillation or ACLS   In the event of cardiac or respiratory ARREST Use medication by any route, position, wound care, and other measures to relive pain and suffering. May use oxygen, suction and manual treatment of airway obstruction as needed for comfort.      11/04/15 0127      Code Status History    Date Active Date Inactive Code Status Order ID Comments User Context   This patient has a current code status but no historical code status.        IV Access:    Peripheral IV   Procedures and diagnostic studies:   Dg Chest 2 View  11/03/2015  CLINICAL DATA:  Lower extremity edema ; history of hypertension and hyperlipidemia EXAM: CHEST  2 VIEW COMPARISON:  PA and lateral chest x-ray of June 02, 2004. FINDINGS: The lungs remain hyperinflated. There is no focal infiltrate. There is no pleural effusion. The heart and pulmonary vascularity are normal. The mediastinum is normal in width. There is calcification in the wall of the aortic arch. There is a small hiatal hernia. There is multilevel degenerative disc disease and mild dextrocurvature of the thoracic spine. IMPRESSION: 1. COPD-reactive airway disease. There is no evidence of pneumonia nor CHF. 2. Aortic atherosclerosis. 3. Small hiatal hernia. Electronically Signed   By: David  Martinique M.D.   On: 11/03/2015 12:58   Nm Pulmonary Perf And Vent  11/04/2015  CLINICAL DATA:  82 year old female with history of DVT. No chest pain or shortness of breath EXAM: NUCLEAR MEDICINE VENTILATION - PERFUSION LUNG SCAN TECHNIQUE: Ventilation images were obtained in multiple projections using inhaled aerosol Tc-69m DTPA. Perfusion images were obtained in multiple projections after intravenous injection of Tc-67m MAA. RADIOPHARMACEUTICALS:  31.8 mCi Technetium-70m DTPA aerosol inhalation and 4.1 mCi Technetium-36m MAA IV  COMPARISON:  Chest radiograph dated 11/03/2015 FINDINGS: Ventilation: No focal ventilation defect. Perfusion: No wedge shaped peripheral perfusion defects to suggest acute pulmonary embolism. IMPRESSION: Low probability for pulmonary embolism. Electronically Signed   By: Anner Crete M.D.   On: 11/04/2015 01:30   US Venous Img Lower Unilateral Left  11/03/2015  CLINICAL DATA:  Left lower extremity bruising  and swelling for three days EXAM: LEFT LOWER EXTREMITY VENOUS DOPPLER ULTRASOUND TECHNIQUE: Gray-scale sonography with graded compression, as well as color Doppler and duplex ultrasound were performed to evaluate the lower extremity deep venous systems from the level of the common femoral vein and including the common femoral, femoral, profunda femoral, popliteal and calf veins including the posterior tibial, peroneal and gastrocnemius veins when visible. The superficial great saphenous vein was also interrogated. Spectral Doppler was utilized to evaluate flow at rest and with distal augmentation maneuvers in the common femoral, femoral and popliteal veins. COMPARISON:  None. FINDINGS: Contralateral Common Femoral Vein: Respiratory phasicity is normal and symmetric with the symptomatic side. No evidence of thrombus. Normal compressibility. Common Femoral Vein: Partially occlusive thrombus with decreased compressibility. Saphenofemoral Junction: Partially occlusive thrombus with decreased compressability. Profunda Femoral Vein: The vein is not compressible and is occluded by thrombus. Femoral Vein: The vein is not compressible and is occluded by thrombus. Popliteal Vein: Partially occlusive thrombus with decreased compressability. Calf Veins:  Not evaluated well due to significant calf edema. Other Findings:  Significant soft tissue edema. IMPRESSION: Thrombus through the left lower extremity deep venous system from common femoral vein through popliteal vein including profunda femoral vein. Electronically Signed   By: Skipper Cliche M.D.   On: 11/03/2015 16:41     Medical Consultants:    None.  Anti-Infectives:   none  Subjective:    Benzie she relates she has had left hip pain, as hungry and has not sleepet.   Objective:    Filed Vitals:   11/04/15 0435 11/04/15 0500 11/04/15 0525 11/04/15 0600  BP: 103/55 46/37 121/62 122/37  Pulse: 91 80  83  Temp:  98.1 F (36.7 C)     TempSrc:  Oral    Resp: 18 21  24   Height:  5\' 3"  (1.6 m)    Weight:  63.9 kg (140 lb 14 oz)    SpO2: 97% 96%  99%   No intake or output data in the 24 hours ending 11/04/15 0737 Filed Weights   11/04/15 0500  Weight: 63.9 kg (140 lb 14 oz)    Exam: General exam: In no acute distress. Respiratory system: Good air movement and clear to auscultation. Cardiovascular system: S1 & S2 heard, RRR. No JVD. Gastrointestinal system: Abdomen is nondistended, soft and nontender.  Central nervous system: Alert and oriented. No focal neurological deficits. Extremities: Asymmetrical lower extremity swelling left greater than right Skin: Multiple bruises throughout her skin. Psychiatry: Judgement and insight appear normal. Mood & affect appropriate.    Data Reviewed:    Labs: Basic Metabolic Panel:  Recent Labs Lab 11/03/15 1226 11/03/15 2054 11/04/15 0452  NA 138 139 140  K 4.8 4.1 3.8  CL 102 104 108  CO2 24 25 25   GLUCOSE 148* 113* 87  BUN 56* 55* 54*  CREATININE 1.63* 1.62* 1.46*  CALCIUM 9.9 10.0 9.0   GFR Estimated Creatinine Clearance: 24 mL/min (by C-G formula based on Cr of 1.46). Liver Function Tests:  Recent Labs Lab 11/03/15 1226 11/03/15 2054  AST 20 25  ALT 18 21  ALKPHOS  44 43  BILITOT 0.8 1.2  PROT 6.0* 5.7*  ALBUMIN 3.6 3.4*   No results for input(s): LIPASE, AMYLASE in the last 168 hours. No results for input(s): AMMONIA in the last 168 hours. Coagulation profile  Recent Labs Lab 11/03/15 2054 11/04/15 0452  INR 1.06 1.09    CBC:  Recent Labs Lab 11/03/15 1228 11/03/15 2054 11/04/15 0452  WBC 12.5* 11.4* 8.5  NEUTROABS  --  8.7*  --   HGB 12.6 12.4 10.6*  HCT 36.1* 36.8 31.3*  MCV 84.6 85.2 84.4  PLT  --  133* 124*   Cardiac Enzymes:  Recent Labs Lab 11/03/15 2054  TROPONINI 0.17*   BNP (last 3 results) No results for input(s): PROBNP in the last 8760 hours. CBG: No results for input(s): GLUCAP in the last 168  hours. D-Dimer: No results for input(s): DDIMER in the last 72 hours. Hgb A1c: No results for input(s): HGBA1C in the last 72 hours. Lipid Profile: No results for input(s): CHOL, HDL, LDLCALC, TRIG, CHOLHDL, LDLDIRECT in the last 72 hours. Thyroid function studies: No results for input(s): TSH, T4TOTAL, T3FREE, THYROIDAB in the last 72 hours.  Invalid input(s): FREET3 Anemia work up: No results for input(s): VITAMINB12, FOLATE, FERRITIN, TIBC, IRON, RETICCTPCT in the last 72 hours. Sepsis Labs:  Recent Labs Lab 11/03/15 1228 11/03/15 2054 11/04/15 0452  WBC 12.5* 11.4* 8.5   Microbiology Recent Results (from the past 240 hour(s))  MRSA PCR Screening     Status: None   Collection Time: 11/03/15  6:17 PM  Result Value Ref Range Status   MRSA by PCR NEGATIVE NEGATIVE Final    Comment:        The GeneXpert MRSA Assay (FDA approved for NASAL specimens only), is one component of a comprehensive MRSA colonization surveillance program. It is not intended to diagnose MRSA infection nor to guide or monitor treatment for MRSA infections.      Medications:   . aspirin EC  81 mg Oral Daily  . predniSONE  5 mg Oral TID  . simvastatin  10 mg Oral q1800  . sodium chloride flush  3 mL Intravenous Q12H  . terazosin  5 mg Oral QHS   Continuous Infusions: . heparin 900 Units/hr (11/03/15 2251)    Time spent: 25 min   LOS: 1 day   Charlynne Cousins  [facility] Pager [phone]  *Please refer to [email or web address], password [email or web address] to get updated schedule on who will round on this patient, as hospitalists switch teams weekly. If 7PM-7AM, please contact night-coverage at [email or web address], password [email or web address] for any overnight needs.  11/04/2015, 7:37 AM

## 2015-11-04 NOTE — Progress Notes (Signed)
Informed Dr. Olevia Bowens of pt being hypotensive this morning.  Said to continue to monitor.  Irven Baltimore, RN

## 2015-11-04 NOTE — Progress Notes (Signed)
ANTICOAGULATION CONSULT NOTE - Follow Up  Pharmacy Consult for Heparin Indication: DVT  Allergies  Allergen Reactions  . Epinephrine     Unknown.  Marland Kitchen Hydrocodone-Acetaminophen     REACTION: nausea   Patient Measurements: Height: 5\' 3"  (160 cm) Weight: 140 lb 14 oz (63.9 kg) IBW/kg (Calculated) : 52.4  Vital Signs: Temp: 98.1 F (36.7 C) (06/24 1753) Temp Source: Oral (06/24 1753) BP: 109/38 mmHg (06/24 1805) Pulse Rate: 84 (06/24 1753)  Labs:  Recent Labs  11/03/15 1226 11/03/15 1228 11/03/15 2054 11/04/15 0452 11/04/15 0823 11/04/15 1900  HGB  --  12.6 12.4 10.6*  --   --   HCT  --  36.1* 36.8 31.3*  --   --   PLT  --   --  133* 124*  --   --   APTT  --   --  20*  --   --   --   LABPROT  --   --  14.0 14.3  --   --   INR  --   --  1.06 1.09  --   --   HEPARINUNFRC  --   --   --   --  0.82* 0.70  CREATININE 1.63*  --  1.62* 1.46*  --   --   TROPONINI  --   --  0.17*  --   --   --    Estimated Creatinine Clearance: 24 mL/min (by C-G formula based on Cr of 1.46).  Medications:  Scheduled:  . aspirin EC  81 mg Oral Daily  . famotidine  40 mg Oral Daily  . furosemide  20 mg Oral Daily  . [START ON 11/05/2015] lisinopril  40 mg Oral Daily  . predniSONE  5 mg Oral TID  . simvastatin  10 mg Oral q1800  . sodium chloride flush  3 mL Intravenous Q12H  . terazosin  5 mg Oral QHS   Assessment: Patient with new LLE DVT, seen on ultrasound from urgent care. To ED with concern for RLE DVT- confirmed by doppler at [facility].  VQ scan low probability for PE (no CT with renal insufficiency).  Today, 11/04/2015  Heparin level at upper end of therapeutic range (0.7) after rate decrease to 700 units/hr  No bleeding or complications reported  Goal of Therapy:  Heparin level 0.3-0.7 units/ml Monitor platelets by anticoagulation protocol: Yes   Plan:   Continue heparin infusion to 700 units/hr  Recheck confirmatory level with AM labs  Daily CBC  Plan transition to  Apixaban 6/25 am  Peggyann Juba, PharmD, BCPS Pager: [phone]  11/04/2015, 8:01 PM

## 2015-11-05 ENCOUNTER — Inpatient Hospital Stay (HOSPITAL_COMMUNITY): Payer: Medicare Other

## 2015-11-05 DIAGNOSIS — D62 Acute posthemorrhagic anemia: Secondary | ICD-10-CM | POA: Diagnosis not present

## 2015-11-05 DIAGNOSIS — N183 Chronic kidney disease, stage 3 (moderate): Secondary | ICD-10-CM | POA: Diagnosis not present

## 2015-11-05 DIAGNOSIS — M7071 Other bursitis of hip, right hip: Secondary | ICD-10-CM | POA: Diagnosis not present

## 2015-11-05 DIAGNOSIS — R7989 Other specified abnormal findings of blood chemistry: Secondary | ICD-10-CM | POA: Diagnosis not present

## 2015-11-05 DIAGNOSIS — I82402 Acute embolism and thrombosis of unspecified deep veins of left lower extremity: Secondary | ICD-10-CM | POA: Diagnosis not present

## 2015-11-05 DIAGNOSIS — K921 Melena: Secondary | ICD-10-CM | POA: Diagnosis not present

## 2015-11-05 DIAGNOSIS — O2230 Deep phlebothrombosis in pregnancy, unspecified trimester: Secondary | ICD-10-CM | POA: Insufficient documentation

## 2015-11-05 DIAGNOSIS — I10 Essential (primary) hypertension: Secondary | ICD-10-CM | POA: Diagnosis not present

## 2015-11-05 LAB — CBC
HCT: 30.1 % — ABNORMAL LOW (ref 36.0–46.0)
HCT: 30.9 % — ABNORMAL LOW (ref 36.0–46.0)
Hemoglobin: 9.9 g/dL — ABNORMAL LOW (ref 12.0–15.0)
Hemoglobin: 10.1 g/dL — ABNORMAL LOW (ref 12.0–15.0)
MCH: 28.7 pg (ref 26.0–34.0)
MCH: 28.8 pg (ref 26.0–34.0)
MCHC: 32.9 g/dL (ref 30.0–36.0)
MCHC: 32.7 g/dL (ref 30.0–36.0)
MCV: 87.2 fL (ref 78.0–100.0)
MCV: 88 fL (ref 78.0–100.0)
PLATELETS: 125 10*3/uL — AB (ref 150–400)
PLATELETS: 115 10*3/uL — AB (ref 150–400)
RBC: 3.45 MIL/uL — ABNORMAL LOW (ref 3.87–5.11)
RBC: 3.51 MIL/uL — ABNORMAL LOW (ref 3.87–5.11)
RDW: 14.5 % (ref 11.5–15.5)
RDW: 14.6 % (ref 11.5–15.5)
WBC: 8.5 10*3/uL (ref 4.0–10.5)
WBC: 10.1 10*3/uL (ref 4.0–10.5)

## 2015-11-05 LAB — ECHOCARDIOGRAM COMPLETE
E decel time: 408 msec
E/e' ratio: 6.04
FS: 35 % (ref 28–44)
HEIGHTINCHES: 63 in
IV/PV OW: 0.98
LA vol A4C: 36.6 ml
LADIAMINDEX: 1.86 cm/m2
LASIZE: 31 mm
LDCA: 2.84 cm2
LEFT ATRIUM END SYS DIAM: 31 mm
LV E/e' medial: 6.04
LV PW d: 12.2 mm — AB (ref 0.6–1.1)
LV e' LATERAL: 10.6 cm/s
LVEEAVG: 6.04
LVOT diameter: 19 mm
MV Dec: 408
MV pk A vel: 89.7 m/s
MV pk E vel: 64 m/s
Reg peak vel: 252 cm/s
TAPSE: 23.4 mm
TDI e' lateral: 10.6
TDI e' medial: 9.68
TRMAXVEL: 252 cm/s
WEIGHTICAEL: 2253.98 [oz_av]

## 2015-11-05 LAB — HEPARIN LEVEL (UNFRACTIONATED): Heparin Unfractionated: 0.74 IU/mL — ABNORMAL HIGH (ref 0.30–0.70)

## 2015-11-05 MED ORDER — PEG-KCL-NACL-NASULF-NA ASC-C 100 G PO SOLR
0.5000 | Freq: Once | ORAL | Status: AC
  Administered 2015-11-05: 100 g via ORAL
  Filled 2015-11-05: qty 1

## 2015-11-05 MED ORDER — PEG-KCL-NACL-NASULF-NA ASC-C 100 G PO SOLR: 1.0000 | Freq: Once | ORAL | Status: DC

## 2015-11-05 MED ORDER — PEG-KCL-NACL-NASULF-NA ASC-C 100 G PO SOLR
0.5000 | Freq: Once | ORAL | Status: AC
  Administered 2015-11-06: 100 g via ORAL

## 2015-11-05 MED ORDER — HEPARIN (PORCINE) IN NACL 100-0.45 UNIT/ML-% IJ SOLN
600.0000 [IU]/h | INTRAMUSCULAR | Status: DC
  Administered 2015-11-05: 600 [IU]/h via INTRAVENOUS
  Filled 2015-11-05: qty 250

## 2015-11-05 NOTE — Progress Notes (Signed)
Nutrition Brief Note  Patient identified on the Malnutrition Screening Tool (MST) Report  Patient eating 50-100% of meals. Insignificant weight loss.   Wt Readings from Last 15 Encounters:  11/04/15 140 lb 14 oz (63.9 kg)  11/03/15 144 lb 6.4 oz (65.499 kg)  04/17/10 183 lb (83.008 kg)    Body mass index is 24.96 kg/(m^2). Patient meets criteria for normal range based on current BMI.   Current diet order is regular, patient is consuming approximately 50-100% of meals at this time. Labs and medications reviewed.   No nutrition interventions warranted at this time. If nutrition issues arise, please consult RD.   Clayton Bibles, MS, RD, LDN Pager: [phone] After Hours Pager: [phone]

## 2015-11-05 NOTE — Progress Notes (Signed)
*  PRELIMINARY RESULTS* Echocardiogram 2D Echocardiogram has been performed.  Leavy Cella 11/05/2015, 2:09 PM

## 2015-11-05 NOTE — Progress Notes (Signed)
ANTICOAGULATION CONSULT NOTE - Follow Up Consult  Pharmacy Consult for Heparin Indication: DVT  Allergies  Allergen Reactions  . Epinephrine     Unknown.  Marland Kitchen Hydrocodone-Acetaminophen     REACTION: nausea    Patient Measurements: Height: 5\' 3"  (160 cm) Weight: 140 lb 14 oz (63.9 kg) IBW/kg (Calculated) : 52.4 Heparin Dosing Weight:   Vital Signs: Temp: 97.5 F (36.4 C) (06/25 0536) Temp Source: Oral (06/25 0536) BP: 119/45 mmHg (06/25 0536) Pulse Rate: 70 (06/25 0536)  Labs:  Recent Labs  11/03/15 1226  11/03/15 2054 11/04/15 0452 11/04/15 0823 11/04/15 1900 11/05/15 0225 11/05/15 0453  HGB  --   < > 12.4 10.6*  --   --  9.9* 10.1*  HCT  --   < > 36.8 31.3*  --   --  30.1* 30.9*  PLT  --   < > 133* 124*  --   --  125* 115*  APTT  --   --  20*  --   --   --   --   --   LABPROT  --   --  14.0 14.3  --   --   --   --   INR  --   --  1.06 1.09  --   --   --   --   HEPARINUNFRC  --   --   --   --  0.82* 0.70  --  0.74*  CREATININE 1.63*  --  1.62* 1.46*  --   --   --   --   TROPONINI  --   --  0.17*  --   --   --   --   --   < > = values in this interval not displayed.  Estimated Creatinine Clearance: 24 mL/min (by C-G formula based on Cr of 1.46).   Medications:  Infusions:  . heparin 700 Units/hr (11/04/15 1738)    Assessment: Patient with high heparin level.  Noted bleeding per RN note; MD aware per RN.  No other heparin issues per RN.  Goal of Therapy:  Heparin level 0.3-0.7 units/ml Monitor platelets by anticoagulation protocol: Yes   Plan:  Decrease heparin to 600 units/hr Recheck level at [place], Shea Stakes Crowford 11/05/2015,5:50 AM

## 2015-11-05 NOTE — Progress Notes (Signed)
Pt had rectal bleeding upon standing to use the BSC. She had about 6 dime size drops fall from her rectum.  She reports that sometimes after a stool at home she will find blood on the tissue after she wipes. She was not having a stool when she started dripping blood. Notified provider on call.

## 2015-11-05 NOTE — Progress Notes (Signed)
[facility] PROGRESS NOTE    Progress Note  Yolanda [name]  [id number] DOB: August 23, 1925 DOA: 11/03/2015 PCP: Criselda Peaches, MD     Brief Narrative:   Yolanda [name] is an 82 y.o. female past history of asthma who presents with a couple of days of leg discomfort and discrepancy inside of her legs was found to have a lower extremity DVT.  Assessment/Plan:   DVT (deep venous thrombosis), left Currently on heparin, will transition her to Eluquis, tomorrow morning. Her GFR is greater than 30 mL/min. She had minor rectal bleeding (painless hematochezia), consult GI for further recommendations. As per patient last 2 colonoscopy had no abnormalities, she relates that at home she has seen bleeding in tissue when she cleans her self. Hemoglobin has remained stable repeat a CBC in the morning. We will hold heparin.  Essential hypertension Resume blood pressure medications.  Hyperlipidemia: Continue statins.  Bursitis of right hip Continue narcotics and steroids for pain. Awaiting physical therapy evaluation.  Troponin level elevated Cardiac biomarkers mildly elevated flat in the setting of chronic renal disease, no complains of chest pain or shortness of breath.  Stasis edema of both lower extremities  CKD stage III: At baseline  DVT prophylaxis: heparin Family Communication:none Disposition Plan/Barrier to D/C: home in am Code Status:     Code Status Orders        Start     Ordered   11/04/15 0128  Do not attempt resuscitation (DNR)   Continuous    Question Answer Comment  In the event of cardiac or respiratory ARREST Do not call a "code blue"   In the event of cardiac or respiratory ARREST Do not perform Intubation, CPR, defibrillation or ACLS   In the event of cardiac or respiratory ARREST Use medication by any route, position, wound care, and other measures to relive pain and suffering. May use oxygen, suction and manual treatment of airway  obstruction as needed for comfort.      11/04/15 0127    Code Status History    Date Active Date Inactive Code Status Order ID Comments User Context   This patient has a current code status but no historical code status.        IV Access:    Peripheral IV   Procedures and diagnostic studies:   Dg Chest 2 View  11/03/2015  CLINICAL DATA:  Lower extremity edema ; history of hypertension and hyperlipidemia EXAM: CHEST  2 VIEW COMPARISON:  PA and lateral chest x-ray of June 02, 2004. FINDINGS: The lungs remain hyperinflated. There is no focal infiltrate. There is no pleural effusion. The heart and pulmonary vascularity are normal. The mediastinum is normal in width. There is calcification in the wall of the aortic arch. There is a small hiatal hernia. There is multilevel degenerative disc disease and mild dextrocurvature of the thoracic spine. IMPRESSION: 1. COPD-reactive airway disease. There is no evidence of pneumonia nor CHF. 2. Aortic atherosclerosis. 3. Small hiatal hernia. Electronically Signed   By: David  Martinique M.D.   On: 11/03/2015 12:58   Nm Pulmonary Perf And Vent  11/04/2015  CLINICAL DATA:  82 year old female with history of DVT. No chest pain or shortness of breath EXAM: NUCLEAR MEDICINE VENTILATION - PERFUSION LUNG SCAN TECHNIQUE: Ventilation images were obtained in multiple projections using inhaled aerosol Tc-2m DTPA. Perfusion images were obtained in multiple projections after intravenous injection of Tc-24m MAA. RADIOPHARMACEUTICALS:  31.8 mCi Technetium-93m DTPA aerosol inhalation and 4.1 mCi Technetium-79m MAA IV  COMPARISON:  Chest radiograph dated 11/03/2015 FINDINGS: Ventilation: No focal ventilation defect. Perfusion: No wedge shaped peripheral perfusion defects to suggest acute pulmonary embolism. IMPRESSION: Low probability for pulmonary embolism. Electronically Signed   By: Anner Crete M.D.   On: 11/04/2015 01:30   US Venous Img Lower Unilateral  Left  11/03/2015  CLINICAL DATA:  Left lower extremity bruising and swelling for three days EXAM: LEFT LOWER EXTREMITY VENOUS DOPPLER ULTRASOUND TECHNIQUE: Gray-scale sonography with graded compression, as well as color Doppler and duplex ultrasound were performed to evaluate the lower extremity deep venous systems from the level of the common femoral vein and including the common femoral, femoral, profunda femoral, popliteal and calf veins including the posterior tibial, peroneal and gastrocnemius veins when visible. The superficial great saphenous vein was also interrogated. Spectral Doppler was utilized to evaluate flow at rest and with distal augmentation maneuvers in the common femoral, femoral and popliteal veins. COMPARISON:  None. FINDINGS: Contralateral Common Femoral Vein: Respiratory phasicity is normal and symmetric with the symptomatic side. No evidence of thrombus. Normal compressibility. Common Femoral Vein: Partially occlusive thrombus with decreased compressibility. Saphenofemoral Junction: Partially occlusive thrombus with decreased compressability. Profunda Femoral Vein: The vein is not compressible and is occluded by thrombus. Femoral Vein: The vein is not compressible and is occluded by thrombus. Popliteal Vein: Partially occlusive thrombus with decreased compressability. Calf Veins:  Not evaluated well due to significant calf edema. Other Findings:  Significant soft tissue edema. IMPRESSION: Thrombus through the left lower extremity deep venous system from common femoral vein through popliteal vein including profunda femoral vein. Electronically Signed   By: Skipper Cliche M.D.   On: 11/03/2015 16:41     Medical Consultants:    None.  Anti-Infectives:   none  Subjective:    Yolanda [name] had an episode of bleeding at night   Objective:    Filed Vitals:   11/04/15 1805 11/04/15 2053 11/05/15 0144 11/05/15 0536  BP: 109/38 116/53 113/47 119/45  Pulse:  112 102 70   Temp:  99 F (37.2 C) 98.6 F (37 C) 97.5 F (36.4 C)  TempSrc:  Oral Oral Oral  Resp:  18 17 16   Height:      Weight:      SpO2:  96% 96% 98%    Intake/Output Summary (Last 24 hours) at 11/05/15 0746 Last data filed at 11/05/15 0556  Gross per 24 hour  Intake    410 ml  Output   1625 ml  Net  -1215 ml   Filed Weights   11/04/15 0500  Weight: 63.9 kg (140 lb 14 oz)    Exam: General exam: In no acute distress. Respiratory system: Good air movement and clear to auscultation. Cardiovascular system: S1 & S2 heard, RRR. No JVD. Gastrointestinal system: Abdomen is nondistended, soft and nontender.  Central nervous system: Alert and oriented. No focal neurological deficits. Extremities: Asymmetrical lower extremity swelling left greater than right Psychiatry: Judgement and insight appear normal. Mood & affect appropriate.    Data Reviewed:    Labs: Basic Metabolic Panel:  Recent Labs Lab 11/03/15 1226 11/03/15 2054 11/04/15 0452  NA 138 139 140  K 4.8 4.1 3.8  CL 102 104 108  CO2 24 25 25   GLUCOSE 148* 113* 87  BUN 56* 55* 54*  CREATININE 1.63* 1.62* 1.46*  CALCIUM 9.9 10.0 9.0   GFR Estimated Creatinine Clearance: 24 mL/min (by C-G formula based on Cr of 1.46). Liver Function Tests:  Recent Labs Lab 11/03/15  1226 11/03/15 2054  AST 20 25  ALT 18 21  ALKPHOS 44 43  BILITOT 0.8 1.2  PROT 6.0* 5.7*  ALBUMIN 3.6 3.4*   No results for input(s): LIPASE, AMYLASE in the last 168 hours. No results for input(s): AMMONIA in the last 168 hours. Coagulation profile  Recent Labs Lab 11/03/15 2054 11/04/15 0452  INR 1.06 1.09    CBC:  Recent Labs Lab 11/03/15 1228 11/03/15 2054 11/04/15 0452 11/05/15 0225 11/05/15 0453  WBC 12.5* 11.4* 8.5 8.5 10.1  NEUTROABS  --  8.7*  --   --   --   HGB 12.6 12.4 10.6* 9.9* 10.1*  HCT 36.1* 36.8 31.3* 30.1* 30.9*  MCV 84.6 85.2 84.4 87.2 88.0  PLT  --  133* 124* 125* 115*   Cardiac Enzymes:  Recent  Labs Lab 11/03/15 2054  TROPONINI 0.17*   BNP (last 3 results) No results for input(s): PROBNP in the last 8760 hours. CBG: No results for input(s): GLUCAP in the last 168 hours. D-Dimer: No results for input(s): DDIMER in the last 72 hours. Hgb A1c: No results for input(s): HGBA1C in the last 72 hours. Lipid Profile: No results for input(s): CHOL, HDL, LDLCALC, TRIG, CHOLHDL, LDLDIRECT in the last 72 hours. Thyroid function studies: No results for input(s): TSH, T4TOTAL, T3FREE, THYROIDAB in the last 72 hours.  Invalid input(s): FREET3 Anemia work up: No results for input(s): VITAMINB12, FOLATE, FERRITIN, TIBC, IRON, RETICCTPCT in the last 72 hours. Sepsis Labs:  Recent Labs Lab 11/03/15 2054 11/04/15 0452 11/05/15 0225 11/05/15 0453  WBC 11.4* 8.5 8.5 10.1   Microbiology Recent Results (from the past 240 hour(s))  MRSA PCR Screening     Status: None   Collection Time: 11/03/15  6:17 PM  Result Value Ref Range Status   MRSA by PCR NEGATIVE NEGATIVE Final    Comment:        The GeneXpert MRSA Assay (FDA approved for NASAL specimens only), is one component of a comprehensive MRSA colonization surveillance program. It is not intended to diagnose MRSA infection nor to guide or monitor treatment for MRSA infections.      Medications:   . aspirin EC  81 mg Oral Daily  . famotidine  40 mg Oral Daily  . furosemide  20 mg Oral Daily  . lisinopril  40 mg Oral Daily  . predniSONE  5 mg Oral TID  . simvastatin  10 mg Oral q1800  . sodium chloride flush  3 mL Intravenous Q12H  . terazosin  5 mg Oral QHS   Continuous Infusions:    Time spent: 25 min   LOS: 2 days   Charlynne Cousins  [facility] Pager [phone]  *Please refer to [email or web address], password [email or web address] to get updated schedule on who will round on this patient, as hospitalists switch teams weekly. If 7PM-7AM, please contact night-coverage at [email or web address], password [email or web address] for any overnight  needs.  11/05/2015, 7:46 AM

## 2015-11-05 NOTE — Progress Notes (Signed)
Dr. Aileen Fass aware via phone pt's BP low today. Last check 70/42. Asymptomatic. Pt reassured. Reconciled need for ordered bp med. See new order received to dc Lisinopril.

## 2015-11-05 NOTE — Evaluation (Signed)
Physical Therapy Evaluation Patient Details Name: Yolanda [name] MRN: [id number] DOB: 1925/07/17 Today's Date: 11/05/2015   History of Present Illness  Yolanda [name] is a 82 y.o. Caucasian female with a past med hx significant for HTN, HLD, asthma, diverticulosis, adenomatous polyps and cecal AVM's who presented to the ER on 11/03/15 with a complaint ofa couple days of left leg discomfort and swelling. At time of admission she was found to have a DVT in both legs and was started on heparin drip which is DC'd due to new onset of hematochezia.  Clinical Impression  Patient assisted to [facility], noted blood on pad on which she was sitting and on toilet paper. . The patient has  Open sores on  Both sides of gluteal folds. A saturated Allevyn was removed and will be replaced by RN. The patient is participatory and ready to mobilize  But is limited due to the low BP. Patient sitting upright with legs dependent. Manual BP 72/40. After return to bed and supine 90/50. RN aware.  Pt admitted with above diagnosis. Pt currently with functional limitations due to the deficits listed below (see PT Problem List).  Pt will benefit from skilled PT to increase their independence and safety with mobility to allow discharge to the venue listed below.       Follow Up Recommendations SNF;Supervision/Assistance - 24 hour    Equipment Recommendations  None recommended by PT    Recommendations for Other Services       Precautions / Restrictions Precautions Precautions: Fall Precaution Comments: monito BP= it is very low, asymptomatic      Mobility  Bed Mobility Overal bed mobility: Needs Assistance Bed Mobility: Sit to Supine       Sit to supine: Mod assist   General bed mobility comments: assist legs onto the bed  Transfers Overall transfer level: Needs assistance Equipment used: Rolling walker (2 wheeled) Transfers: Sit to/from [facility] Sit to Stand: Mod assist Stand pivot  transfers: Mod assist       General transfer comment: assist to rise from reclinere and  BSC, pivot to [facility] then backward steps to bed with RW .  Ambulation/Gait                Stairs            Wheelchair Mobility    Modified Rankin (Stroke Patients Only)       Balance Overall balance assessment: Needs assistance Sitting-balance support: Feet supported;Bilateral upper extremity supported Sitting balance-Leahy Scale: Good     Standing balance support: Bilateral upper extremity supported;During functional activity Standing balance-Leahy Scale: Fair                               Pertinent Vitals/Pain Pain Assessment: Faces Faces Pain Scale: Hurts little more Pain Location: Left leg Pain Descriptors / Indicators: Discomfort;Tightness Pain Intervention(s): Limited activity within patient's tolerance;Monitored during session    Home Living Family/patient expects to be discharged to:: Skilled nursing facility                 Additional Comments: lives Independent Living at [facility]    Prior Function Level of Independence: Independent with assistive device(s)               Hand Dominance        Extremity/Trunk Assessment   Upper Extremity Assessment: Overall WFL for tasks assessed  Lower Extremity Assessment: Generalized weakness;LLE deficits/detail   LLE Deficits / Details: significant edema and bruising  Cervical / Trunk Assessment: Normal  Communication   Communication: No difficulties  Cognition Arousal/Alertness: Awake/alert Behavior During Therapy: WFL for tasks assessed/performed Overall Cognitive Status: Within Functional Limits for tasks assessed                      General Comments      Exercises        Assessment/Plan    PT Assessment Patient needs continued PT services  PT Diagnosis Difficulty walking;Generalized weakness   PT Problem List Decreased strength;Decreased  activity tolerance;Decreased mobility;Decreased safety awareness;Decreased knowledge of precautions;Pain  PT Treatment Interventions DME instruction;Patient/family education;Gait training;Functional mobility training;Therapeutic activities;Therapeutic exercise   PT Goals (Current goals can be found in the Care Plan section) Acute Rehab PT Goals Patient Stated Goal: [facility] to [facility] for [facility] PT Goal Formulation: With patient Time For Goal Achievement: 11/19/15 Potential to Achieve Goals: Good    Frequency Min 3X/week   Barriers to discharge        Co-evaluation               End of Session   Activity Tolerance: Patient tolerated treatment well Patient left: in bed;with call bell/phone within reach;with bed alarm set Nurse Communication: Mobility status         Time: [id number] PT Time Calculation (min) (ACUTE ONLY): 16 min   Charges:   PT Evaluation $PT Eval Low Complexity: 1 Procedure     PT G CodesClaretha Cooper 11/05/2015, 11:18 AM  Tresa Endo PT [phone]

## 2015-11-05 NOTE — Consult Note (Signed)
Consultation  Referring Provider: Dr. Aileen Fass     Primary Care Physician:  Criselda Peaches, MD Primary Gastroenterologist:   Dr. Deatra Ina      Reason for Consultation:  Rectal Bleeding            HPI:   Yolanda [name] is a 82 y.o. Caucasian female with a past med hx significant for HTN, HLD, asthma, diverticulosis, adenomatous polyps and cecal AVM's who presented to the ER on 11/03/15 with a complaint of "a couple days of left leg discomfort".  At time of admission she was found to have a DVT and was started on heparin drip, plans were for discharge on Eliquis. We are consulted today in regards to new onset of hematochezia.  This morning, the patient is found sitting up in her bed eating a regular diet for breakfast. She tells me that her husband passed away in 29-Jun-2022 and since that time she has begun to lose weight, had a decreased appetite and noticed a decrease in the amount of bowel movements that she was experiencing. She denies constipation. She describes that over the past 2 weeks she has been noticing 1-2 drops of bright red blood on the toilet paper after wiping from a bowel movement. Last night, she went to sit on the commode to urinate and she and the nurse both noticed a large amount of bright red blood in the toilet. Per nursing this morning the patient has continued with some bright red blood which was seen on the toilet paper after wiping as well as on her bed sheet. The patient's heparin was stopped today due to this bleeding.  Patient denies any other GI complaints today including fever, chills, dysphagia, heartburn, reflux, nausea or vomiting.  Past GI Workup: 05/15/10-Colonoscopy-Dr. Deatra Ina: Cecal AVM's, one cauterized and a few scattered diverticula in ascending and descending colon 03/26/05-Path from colonoscopy: adenomatous and hyperplastic polyp 01/20/02-Colonoscopy-Dr. Deatra Ina: 56mm polyp ascending colon, 48mm polyp descending colon, 20mm polyp sigmoid colon and  diverticulosis in the descending and sigmoid colon; Path-hyperplastic polyps in ascending and descending colon, adenomatous polyp in the sigmoid colon  Past Medical History  Diagnosis Date  . Asthma   . Cataract   . Allergy   . Hyperlipidemia   . Hypertension   . Osteopenia   . Bursitis of right hip     Past Surgical History  Procedure Laterality Date  . Eye surgery    . Abdominal hysterectomy      Family History Negative for liver or pancreatic disease, IBD, colon polyps or cancer  Social History  Substance Use Topics  . Smoking status: Never Smoker   . Smokeless tobacco: None  . Alcohol Use: No    Prior to Admission medications   Medication Sig Start Date End Date Taking? Authorizing Provider  aspirin 81 MG tablet Take 81 mg by mouth daily.   Yes Historical Provider, MD  lisinopril (PRINIVIL,ZESTRIL) 40 MG tablet Take 40 mg by mouth daily.   Yes Historical Provider, MD  Multiple Vitamins-Minerals (EYE VITAMINS) CAPS Take 1 capsule by mouth daily.   Yes Historical Provider, MD  predniSONE (DELTASONE) 5 MG tablet Take 5 mg by mouth 3 (three) times daily.   Yes Historical Provider, MD  simvastatin (ZOCOR) 10 MG tablet Take 10 mg by mouth daily.   Yes Historical Provider, MD  terazosin (HYTRIN) 5 MG capsule Take 5 mg by mouth at bedtime.   Yes Historical Provider, MD  furosemide (LASIX) 20 MG tablet  Take 1 tablet (20 mg total) by mouth daily. 11/03/15   Leonie Douglas, PA-C    Current Facility-Administered Medications  Medication Dose Route Frequency Provider Last Rate Last Dose  . acetaminophen (TYLENOL) tablet 650 mg  650 mg Oral Q6H PRN Karmen Bongo, MD       Or  . acetaminophen (TYLENOL) suppository 650 mg  650 mg Rectal Q6H PRN Karmen Bongo, MD      . aspirin EC tablet 81 mg  81 mg Oral Daily Karmen Bongo, MD   81 mg at 11/04/15 1023  . famotidine (PEPCID) tablet 40 mg  40 mg Oral Daily Charlynne Cousins, MD   40 mg at 11/04/15 1024  . furosemide (LASIX)  tablet 20 mg  20 mg Oral Daily Charlynne Cousins, MD   20 mg at 11/04/15 1659  . heparin ADULT infusion 100 units/mL (25000 units/264mL sodium chloride 0.45%)  600 Units/hr Intravenous Continuous Charlynne Cousins, MD      . lisinopril (PRINIVIL,ZESTRIL) tablet 40 mg  40 mg Oral Daily Charlynne Cousins, MD      . morphine 2 MG/ML injection 2 mg  2 mg Intravenous Q4H PRN Karmen Bongo, MD   2 mg at 11/04/15 0438  . ondansetron (ZOFRAN) tablet 4 mg  4 mg Oral Q6H PRN Karmen Bongo, MD       Or  . ondansetron [facility]) injection 4 mg  4 mg Intravenous Q6H PRN Karmen Bongo, MD      . predniSONE (DELTASONE) tablet 5 mg  5 mg Oral TID Karmen Bongo, MD   5 mg at 11/04/15 2216  . simvastatin (ZOCOR) tablet 10 mg  10 mg Oral q1800 Karmen Bongo, MD   10 mg at 11/04/15 1735  . sodium chloride flush (NS) 0.9 % injection 3 mL  3 mL Intravenous Q12H Karmen Bongo, MD   3 mL at 11/04/15 1028  . technetium TC 29M diethylenetriame-pentaacetic acid (DTPA) injection 31.8 milli Curie  31.8 milli Curie Intravenous Once PRN David A Martinique, MD      . terazosin (HYTRIN) capsule 5 mg  5 mg Oral QHS Karmen Bongo, MD   5 mg at 11/04/15 2216    Allergies as of 11/03/2015 - Review Complete 11/03/2015  Allergen Reaction Noted  . Epinephrine    . Hydrocodone-acetaminophen       Review of Systems:    Constitutional: Positive for weight loss No fever, chills, weakness or fatigue HEENT: Eyes: No change in vision               Ears, Nose, Throat:  No change in hearing Skin: No new rash or lesion Cardiovascular: No chest pain or palpitations       Respiratory: No SOB or cough Gastrointestinal: See HPI and otherwise negative Genitourinary: No dysuria or change in urinary frequency Neurological: No headache, dizziness or syncope Musculoskeletal: Positive for bursitis in her right hip and left leg pain which brought her to the hospital No muscle or back pain Hematologic: Patient describes history of easy  bruising  Psychiatric: No history of depression or anxiety    Physical Exam:  Vital signs in last 24 hours: Temp:  [97.5 F (36.4 C)-99 F (37.2 C)] 97.5 F (36.4 C) (06/25 0536) Pulse Rate:  [61-112] 70 (06/25 0536) Resp:  [12-24] 16 (06/25 0536) BP: (81-123)/(25-53) 119/45 mmHg (06/25 0536) SpO2:  [96 %-99 %] 98 % (06/25 0536) Last BM Date: 11/03/15 General:   Pleasant Caucasian female appears to be in  NAD, Well developed, Well nourished, alert and cooperative Head:  Normocephalic and atraumatic. Eyes:   PEERL, EOMI. No icterus. Conjunctiva pink. Ears:  Normal auditory acuity. Neck:  Supple Throat: Oral cavity and pharynx without inflammation, swelling or lesion.  Lungs: Respirations even and unlabored. Lungs clear to auscultation bilaterally.   No wheezes, crackles, or rhonchi.  Heart: Normal S1, S2. No MRG. Regular rate and rhythm. Trace bilateral pedal edema up to the level of ankle, no cyanosis or pallor.  Abdomen:  Soft, nondistended, nontender. No rebound or guarding. Normal bowel sounds. No appreciable masses or hepatomegaly. Rectal: External hemorrhoids noted, possible fissure with no tenderness on palpation, internal exam with no mass palpated, no residue or frank blood Msk:  Symmetrical without gross deformities. Peripheral pulses intact.  Extremities:  Without edema, no deformity or joint abnormality. Normal ROM, normal sensation. Neurologic:  Alert and  oriented x4;  grossly normal neurologically.  Skin:   Numerous bruises Dry and intact without significant lesions or rashes. Psychiatric: Oriented to person, place and time. Demonstrates good judgement and reason without abnormal affect or behaviors.   LAB RESULTS:  Recent Labs  11/04/15 0452 11/05/15 0225 11/05/15 0453  WBC 8.5 8.5 10.1  HGB 10.6* 9.9* 10.1*  HCT 31.3* 30.1* 30.9*  PLT 124* 125* 115*   BMET  Recent Labs  11/03/15 1226 11/03/15 2054 11/04/15 0452  NA 138 139 140  K 4.8 4.1 3.8  CL 102  104 108  CO2 24 25 25   GLUCOSE 148* 113* 87  BUN 56* 55* 54*  CREATININE 1.63* 1.62* 1.46*  CALCIUM 9.9 10.0 9.0   LFT  Recent Labs  11/03/15 2054  PROT 5.7*  ALBUMIN 3.4*  AST 25  ALT 21  ALKPHOS 43  BILITOT 1.2   PT/INR  Recent Labs  11/03/15 2054 11/04/15 0452  LABPROT 14.0 14.3  INR 1.06 1.09    STUDIES: Dg Chest 2 View  11/03/2015  CLINICAL DATA:  Lower extremity edema ; history of hypertension and hyperlipidemia EXAM: CHEST  2 VIEW COMPARISON:  PA and lateral chest x-ray of June 02, 2004. FINDINGS: The lungs remain hyperinflated. There is no focal infiltrate. There is no pleural effusion. The heart and pulmonary vascularity are normal. The mediastinum is normal in width. There is calcification in the wall of the aortic arch. There is a small hiatal hernia. There is multilevel degenerative disc disease and mild dextrocurvature of the thoracic spine. IMPRESSION: 1. COPD-reactive airway disease. There is no evidence of pneumonia nor CHF. 2. Aortic atherosclerosis. 3. Small hiatal hernia. Electronically Signed   By: David  Martinique M.D.   On: 11/03/2015 12:58   Nm Pulmonary Perf And Vent  11/04/2015  CLINICAL DATA:  82 year old female with history of DVT. No chest pain or shortness of breath EXAM: NUCLEAR MEDICINE VENTILATION - PERFUSION LUNG SCAN TECHNIQUE: Ventilation images were obtained in multiple projections using inhaled aerosol Tc-24m DTPA. Perfusion images were obtained in multiple projections after intravenous injection of Tc-78m MAA. RADIOPHARMACEUTICALS:  31.8 mCi Technetium-55m DTPA aerosol inhalation and 4.1 mCi Technetium-70m MAA IV COMPARISON:  Chest radiograph dated 11/03/2015 FINDINGS: Ventilation: No focal ventilation defect. Perfusion: No wedge shaped peripheral perfusion defects to suggest acute pulmonary embolism. IMPRESSION: Low probability for pulmonary embolism. Electronically Signed   By: Anner Crete M.D.   On: 11/04/2015 01:30   US Venous Img  Lower Unilateral Left  11/03/2015  CLINICAL DATA:  Left lower extremity bruising and swelling for three days EXAM: LEFT LOWER EXTREMITY VENOUS DOPPLER  ULTRASOUND TECHNIQUE: Gray-scale sonography with graded compression, as well as color Doppler and duplex ultrasound were performed to evaluate the lower extremity deep venous systems from the level of the common femoral vein and including the common femoral, femoral, profunda femoral, popliteal and calf veins including the posterior tibial, peroneal and gastrocnemius veins when visible. The superficial great saphenous vein was also interrogated. Spectral Doppler was utilized to evaluate flow at rest and with distal augmentation maneuvers in the common femoral, femoral and popliteal veins. COMPARISON:  None. FINDINGS: Contralateral Common Femoral Vein: Respiratory phasicity is normal and symmetric with the symptomatic side. No evidence of thrombus. Normal compressibility. Common Femoral Vein: Partially occlusive thrombus with decreased compressibility. Saphenofemoral Junction: Partially occlusive thrombus with decreased compressability. Profunda Femoral Vein: The vein is not compressible and is occluded by thrombus. Femoral Vein: The vein is not compressible and is occluded by thrombus. Popliteal Vein: Partially occlusive thrombus with decreased compressability. Calf Veins:  Not evaluated well due to significant calf edema. Other Findings:  Significant soft tissue edema. IMPRESSION: Thrombus through the left lower extremity deep venous system from common femoral vein through popliteal vein including profunda femoral vein. Electronically Signed   By: Skipper Cliche M.D.   On: 11/03/2015 16:41     PREVIOUS ENDOSCOPIES:            See HPI   Impression / Plan:  Impression: 1. Rectal bleeding: Per nursing started last night while patient is urinating, has continued but slowed this morning, patient denies BM since time of admission-linear tear and hemorrhoids seen  on rectal exam today- heparin stopped today 11/05/15, hemoglobin has decreased from 12.6 at time of admission 11/03/15 to 10.1 this morning; question anal fissure versus hemorrhoids versus AVM 2. LE DVT: Hospitalist tx: was on heparin drip, stopped this morning due to rectal bleeding 3. H/o Cecal AVMs 4. H/o Diverticulosis  Plan: 1. Continue supportive care 2. Monitor hgb with transfusion as necessary 3. Agree with holding heparin 4. Will discuss above with Dr. Hilarie Fredrickson, please await any further recommendations after he sees pt this afternoon  Thank you for your kind consultation, we will continue to follow.  Lavone Nian Lemmon  11/05/2015, 8:22 AM Pager #: [phone]

## 2015-11-06 ENCOUNTER — Inpatient Hospital Stay (HOSPITAL_COMMUNITY): Payer: Medicare Other | Admitting: Anesthesiology

## 2015-11-06 ENCOUNTER — Encounter (HOSPITAL_COMMUNITY): Payer: Self-pay | Admitting: Gastroenterology

## 2015-11-06 ENCOUNTER — Encounter (HOSPITAL_COMMUNITY)
Admission: EM | Disposition: A | Discharge status: Home / Self Care | Payer: Self-pay | Source: Home / Self Care | Attending: Internal Medicine

## 2015-11-06 DIAGNOSIS — I82412 Acute embolism and thrombosis of left femoral vein: Secondary | ICD-10-CM | POA: Diagnosis not present

## 2015-11-06 DIAGNOSIS — K5730 Diverticulosis of large intestine without perforation or abscess without bleeding: Secondary | ICD-10-CM | POA: Diagnosis not present

## 2015-11-06 DIAGNOSIS — M79605 Pain in left leg: Secondary | ICD-10-CM | POA: Diagnosis not present

## 2015-11-06 DIAGNOSIS — I82432 Acute embolism and thrombosis of left popliteal vein: Secondary | ICD-10-CM | POA: Diagnosis not present

## 2015-11-06 DIAGNOSIS — I82402 Acute embolism and thrombosis of unspecified deep veins of left lower extremity: Secondary | ICD-10-CM | POA: Diagnosis not present

## 2015-11-06 DIAGNOSIS — K5521 Angiodysplasia of colon with hemorrhage: Secondary | ICD-10-CM | POA: Insufficient documentation

## 2015-11-06 DIAGNOSIS — D62 Acute posthemorrhagic anemia: Secondary | ICD-10-CM | POA: Diagnosis not present

## 2015-11-06 DIAGNOSIS — M7989 Other specified soft tissue disorders: Secondary | ICD-10-CM | POA: Diagnosis not present

## 2015-11-06 DIAGNOSIS — I10 Essential (primary) hypertension: Secondary | ICD-10-CM | POA: Diagnosis not present

## 2015-11-06 DIAGNOSIS — K625 Hemorrhage of anus and rectum: Secondary | ICD-10-CM | POA: Diagnosis not present

## 2015-11-06 DIAGNOSIS — K5520 Angiodysplasia of colon without hemorrhage: Secondary | ICD-10-CM | POA: Insufficient documentation

## 2015-11-06 DIAGNOSIS — K922 Gastrointestinal hemorrhage, unspecified: Secondary | ICD-10-CM

## 2015-11-06 DIAGNOSIS — N183 Chronic kidney disease, stage 3 (moderate): Secondary | ICD-10-CM | POA: Diagnosis not present

## 2015-11-06 DIAGNOSIS — Q2733 Arteriovenous malformation of digestive system vessel: Secondary | ICD-10-CM

## 2015-11-06 DIAGNOSIS — K649 Unspecified hemorrhoids: Secondary | ICD-10-CM | POA: Diagnosis not present

## 2015-11-06 HISTORY — PX: COLONOSCOPY: SHX5424

## 2015-11-06 LAB — BASIC METABOLIC PANEL
Anion gap: 9 (ref 5–15)
BUN: 47 mg/dL — AB (ref 6–20)
CALCIUM: 8.5 mg/dL — AB (ref 8.9–10.3)
CO2: 21 mmol/L — AB (ref 22–32)
CREATININE: 1.32 mg/dL — AB (ref 0.44–1.00)
Chloride: 112 mmol/L — ABNORMAL HIGH (ref 101–111)
GFR calc Af Amer: 40 mL/min — ABNORMAL LOW (ref >60)
GFR calc non Af Amer: 35 mL/min — ABNORMAL LOW (ref >60)
GLUCOSE: 241 mg/dL — AB (ref 65–99)
Potassium: 4 mmol/L (ref 3.5–5.1)
Sodium: 142 mmol/L (ref 135–145)

## 2015-11-06 LAB — CBC
HCT: 33.5 % — ABNORMAL LOW (ref 36.0–46.0)
Hemoglobin: 11 g/dL — ABNORMAL LOW (ref 12.0–15.0)
MCH: 28.5 pg (ref 26.0–34.0)
MCHC: 32.8 g/dL (ref 30.0–36.0)
MCV: 86.8 fL (ref 78.0–100.0)
PLATELETS: 139 10*3/uL — AB (ref 150–400)
RBC: 3.86 MIL/uL — ABNORMAL LOW (ref 3.87–5.11)
RDW: 14.4 % (ref 11.5–15.5)
WBC: 10.5 10*3/uL (ref 4.0–10.5)

## 2015-11-06 LAB — HM COLONOSCOPY

## 2015-11-06 SURGERY — COLONOSCOPY
Anesthesia: Monitor Anesthesia Care

## 2015-11-06 MED ORDER — ONDANSETRON HCL 4 MG/2ML IJ SOLN: 4.0000 mg | Freq: Once | INTRAMUSCULAR | Status: DC | PRN

## 2015-11-06 MED ORDER — FENTANYL CITRATE (PF) 100 MCG/2ML IJ SOLN: 25.0000 ug | INTRAMUSCULAR | Status: DC | PRN

## 2015-11-06 MED ORDER — PROPOFOL 10 MG/ML IV BOLUS
INTRAVENOUS | Status: DC | PRN
  Administered 2015-11-06 (×2): 30 mg via INTRAVENOUS

## 2015-11-06 MED ORDER — LACTATED RINGERS IV SOLN
INTRAVENOUS | Status: DC | PRN
  Administered 2015-11-06: 13:00:00 via INTRAVENOUS

## 2015-11-06 MED ORDER — SODIUM CHLORIDE 0.9 % IV SOLN: INTRAVENOUS | Status: DC

## 2015-11-06 MED ORDER — LIDOCAINE HCL (CARDIAC) 20 MG/ML IV SOLN
INTRAVENOUS | Status: DC | PRN
  Administered 2015-11-06: 50 mg via INTRAVENOUS

## 2015-11-06 MED ORDER — LIDOCAINE HCL (CARDIAC) 20 MG/ML IV SOLN
INTRAVENOUS | Status: AC
  Filled 2015-11-06: qty 5

## 2015-11-06 MED ORDER — HYDROCORTISONE ACETATE 25 MG RE SUPP
25.0000 mg | Freq: Every day | RECTAL | Status: DC
  Administered 2015-11-06: 25 mg via RECTAL
  Filled 2015-11-06 (×3): qty 1

## 2015-11-06 MED ORDER — PROPOFOL 500 MG/50ML IV EMUL
INTRAVENOUS | Status: DC | PRN
  Administered 2015-11-06: 75 ug/kg/min via INTRAVENOUS

## 2015-11-06 MED ORDER — PROPOFOL 10 MG/ML IV BOLUS
INTRAVENOUS | Status: AC
  Filled 2015-11-06: qty 40

## 2015-11-06 MED ORDER — LACTATED RINGERS IV SOLN
INTRAVENOUS | Status: DC
  Administered 2015-11-06: 13:00:00 via INTRAVENOUS

## 2015-11-06 NOTE — Progress Notes (Signed)
PT Cancellation Note  Patient Details Name: Yolanda [name] MRN: [id number] DOB: July 04, 1925   Cancelled Treatment:     Colonoscopy.  Will reattempt another day.    [name] 11/06/2015, 1:26 PM

## 2015-11-06 NOTE — Care Management Obs Status (Signed)
[facility] NOTIFICATION   Patient Details  Name: SERENITIE KAMEI MRN: [id number] Date of Birth: Dec 19, 1925   Medicare Observation Status Notification Given:  Yes. Code 44 also given.    Purcell Mouton, RN 11/06/2015, 4:51 PM

## 2015-11-06 NOTE — Clinical Social Work Note (Signed)
Clinical Social Work Assessment  Patient Details  Name: Yolanda [name] MRN: [id number] Date of Birth: 1925/12/31  Date of referral:  11/06/15               Reason for consult:  Discharge Planning                Permission sought to share information with:  Facility Art therapist granted to share information::  Yes, Verbal Permission Granted  Name::        Agency::     Relationship::     Contact Information:     Housing/Transportation Living arrangements for the past 2 months:  [place] of Information:  Patient Patient Interpreter Needed:  None Criminal Activity/Legal Involvement Pertinent to Current Situation/Hospitalization:  No - Comment as needed Significant Relationships:    Lives with:  Friends Do you feel safe going back to the place where you live?  Yes Need for family participation in patient care:  No (Coment)  Care giving concerns:  Pt feels she may need more care than available at home following hospital d/c.   Social Worker assessment / plan:  Pt hospitalized on 11/03/15 with a DVT. Pt is from [place] [facility]. PN reviewed. PT has recommended SNF at d/c. CSW met with pt to review PT recommendations. Pt isn't sure she will need rehab at d/c. Pt is interested in respite care at [facility]. CSW spoke with admissions at [facility] and relayed pt's request. Admissions reports that pt has 2 respite days available at [facility]. CSW has sent info to [facility] for review. CSW will continue to follow to assist with d/c planning to either SNF or respite care at [facility], whichever most appropriate.  Employment status:  Retired Nurse, adult PT Recommendations:  Not assessed at this time Information / Referral to community resources:  [place]  Patient/Family's Response to care:  Disposition to be determined.  Patient/Family's Understanding of and  Emotional Response to Diagnosis, Current Treatment, and Prognosis:  Pt is aware she will have a colonoscopy today. Results are pending. " I'm not concerned with the procedure. I'll be sleeping."   Emotional Assessment Appearance:  Appears stated age Attitude/Demeanor/Rapport:  Other (cooperative) Affect (typically observed):  Appropriate, Calm, Pleasant Orientation:  Oriented to Self, Oriented to Place, Oriented to  Time, Oriented to Situation Alcohol / Substance use:  Not Applicable Psych involvement (Current and /or in the community):  No (Comment)  Discharge Needs  Concerns to be addressed:  Discharge Planning Concerns Readmission within the last 30 days:  No Current discharge risk:  None Barriers to Discharge:  No Barriers Identified   Luretha Rued, [place] 11/06/2015, 2:00 PM

## 2015-11-06 NOTE — Op Note (Signed)
[facility] Patient Name: Yolanda [name] Procedure Date: 11/06/2015 MRN: [id number] Attending MD: Mauri Pole , MD Date of Birth: [birth year] CSN: [id number] Age: 82 Admit Type: Inpatient Procedure:                Colonoscopy Indications:              Evaluation of unexplained GI bleeding Providers:                Mauri Pole, MD, Cleda Daub, RN, Alfonso Patten, Technician, [facility], CRNA Referring MD:              Medicines:                Monitored Anesthesia Care Complications:            No immediate complications. Estimated Blood Loss:     Estimated blood loss: none. Procedure:                Pre-Anesthesia Assessment:                           - Prior to the procedure, a History and Physical                            was performed, and patient medications and                            allergies were reviewed. The patient's tolerance of                            previous anesthesia was also reviewed. The risks                            and benefits of the procedure and the sedation                            options and risks were discussed with the patient.                            All questions were answered, and informed consent                            was obtained. Prior Anticoagulants: The patient                            last took heparin on the day of the procedure. ASA                            Grade Assessment: III - A patient with severe                            systemic disease. After reviewing the risks and  benefits, the patient was deemed in satisfactory                            condition to undergo the procedure.                           After obtaining informed consent, the colonoscope                            was passed under direct vision. Throughout the                            procedure, the patient's blood pressure, pulse, and             oxygen saturations were monitored continuously. The                            [id number] scope was introduced through                            the anus and advanced to the the terminal ileum,                            with identification of the appendiceal orifice and                            IC valve. The colonoscopy was performed without                            difficulty. The patient tolerated the procedure                            well. The quality of the bowel preparation was                            good. The terminal ileum, ileocecal valve,                            appendiceal orifice, and rectum were photographed. Scope In: 1:24:08 PM Scope Out: 1:44:03 PM Scope Withdrawal Time: 0 hours 7 minutes 13 seconds  Total Procedure Duration: 0 hours 19 minutes 55 seconds  Findings:      The perianal exam findings include a decubitus ulceration.      A single medium-sized localized angiodysplastic lesion with stigmata of       recent bleeding was found in the cecum. Coagulation for bleeding       prevention using argon plasma was successful.      A few small-mouthed diverticula were found in the sigmoid colon,       descending colon and ascending colon.      Internal hemorrhoids were found during retroflexion with stigmata of       bleeding on one of the columns. The hemorrhoids were medium-sized.      The exam was otherwise without abnormality. Impression:               - Decubitus ulceration found on perianal  exam.                           - A single recently bleeding colonic                            angiodysplastic lesion. Treated with argon plasma                            coagulation (APC).                           - Diverticulosis in the sigmoid colon, in the                            descending colon and in the ascending colon.                           - Medium internal hemorrhoids with stigmata of                            bleeding.                            - The examination was otherwise normal.                           - No specimens collected. Moderate Sedation:      N/A- Per Anesthesia Care Recommendation:           - Resume previous diet today.                           - Continue present medications.                           - Resume heparin at prior dose today. Refer to                            primary physician for further adjustment of therapy.                           - Anusol suppository at bedtime x 5-7 days and                            subsequently as needed                           -Miralax daily 1 capful as needed to prevent                            constipation                           -Will sign off, please call with any questions                           -  No repeat colonoscopy due to age.                           - Return to GI clinic PRN. Procedure Code(s):        --- Professional ---                           [phone], Colonoscopy, flexible; with control of                            bleeding, any method Diagnosis Code(s):        --- Professional ---                           [id number], Pressure ulcer of other site, unspecified                            stage                           K55.21, Angiodysplasia of colon with hemorrhage                           K64.8, Other hemorrhoids                           K92.2, Gastrointestinal hemorrhage, unspecified                           K57.30, Diverticulosis of large intestine without                            perforation or abscess without bleeding CPT copyright 2016 American Medical Association. All rights reserved. The codes documented in this report are preliminary and upon coder review may  be revised to meet current compliance requirements. Mauri Pole, MD 11/06/2015 2:00:01 PM This report has been signed electronically. Number of Addenda: 0

## 2015-11-06 NOTE — Care Management Important Message (Signed)
Important Message  Patient Details  Name: EVANI BURDI MRN: [id number] Date of Birth: May 11, 1926   Medicare Important Message Given:  Yes    Camillo Flaming 11/06/2015, 11:56 AMImportant Message  Patient Details  Name: LAWRYN SUN MRN: [id number] Date of Birth: 1925-11-04   Medicare Important Message Given:  Yes    Camillo Flaming 11/06/2015, 11:56 AM

## 2015-11-06 NOTE — Progress Notes (Signed)
Progress Note   Subjective  Yolanda [name] is an 82 y/o Caucasian female who we consulted 11/05/15 regarding new onset of painless rectal bleeding after 2 days on a heparin drip initiated for DVT.  This morning, the patient tells me that she was able to finish the entirety of her prep over the night and is currently on the comode at the time of interview. Her stool appears mostly clear. She tells me she is somewhat anxious regarding today's procedure and asks many questions about what will happen afterwards. Per nursing and pt there has been no further signs of brbpr since yesterday afternoon.    Objective   Vital signs in last 24 hours: Temp:  [97.6 F (36.4 C)-98.7 F (37.1 C)] 97.6 F (36.4 C) (06/26 0550) Pulse Rate:  [77-106] 89 (06/26 0550) Resp:  [15-18] 18 (06/26 0550) BP: (71-118)/(33-58) 115/58 mmHg (06/26 0550) SpO2:  [97 %-100 %] 97 % (06/26 0550) Last BM Date: 11/05/15 General: Caucasian female in NAD Heart:  Regular rate and rhythm; no murmurs Lungs: Respirations even and unlabored, lungs CTA bilaterally Abdomen:  Soft, nontender and nondistended. Normal bowel sounds. Extremities:  Without edema. Neurologic:  Alert and oriented,  grossly normal neurologically. Psych:  Cooperative. Normal mood and affect.  Intake/Output from previous day: 06/25 0701 - 06/26 0700 In: 363 [P.O.:360; I.V.:3] Out: 250 [Urine:250]  Lab Results:  Recent Labs  11/05/15 0225 11/05/15 0453 11/06/15 0429  WBC 8.5 10.1 10.5  HGB 9.9* 10.1* 11.0*  HCT 30.1* 30.9* 33.5*  PLT 125* 115* 139*   BMET  Recent Labs  11/03/15 2054 11/04/15 0452 11/06/15 0429  NA 139 140 142  K 4.1 3.8 4.0  CL 104 108 112*  CO2 25 25 21*  GLUCOSE 113* 87 241*  BUN 55* 54* 47*  CREATININE 1.62* 1.46* 1.32*  CALCIUM 10.0 9.0 8.5*   LFT  Recent Labs  11/03/15 2054  PROT 5.7*  ALBUMIN 3.4*  AST 25  ALT 21  ALKPHOS 43  BILITOT 1.2   PT/INR  Recent Labs  11/03/15 2054 11/04/15 0452    LABPROT 14.0 14.3  INR 1.06 1.09    Studies/Results: No results found.     Assessment / Plan:   Impression: 1. Rectal bleeding: Per nursing started the night of 11/04/15 while patient was urinating,continued but slowed throughout the day 6/25- linear tear and hemorrhoids seen on rectal exam- heparin stopped 11/05/15, hemoglobin had decreased from 12.6 at time of admission 11/03/15 to 10.1 6/25-increased today back to 11; question anal fissure versus hemorrhoids versus AVM 2. LE DVT: Hospitalist tx: was on heparin drip for DVT, stopped 6/25 am due to rectal bleeding-plans for anticoagulation for 3-6 mos after d/c-pending results of colonoscopy 3. H/o Cecal AVMs 4. H/o Diverticulosis  Plan: 1. Continue supportive care 2. Monitor hgb with transfusion as necessary 3. Agree with holding heparin, pending results of colonoscopy today 4. Patient to remain NPO after 9 am this morning 5. Colonoscopy scheduled with Dr. Silverio Decamp at 1:00 this afternoon 6. Please await further recommendations from Dr. Silverio Decamp after time of procedure today  Thank you for your kind consultation, we will continue to follow  Principal Problem:   DVT (deep venous thrombosis), left Active Problems:   Essential hypertension   Hyperlipidemia   Bursitis of right hip   Troponin level elevated   Stasis edema of both lower extremities   CKD (chronic kidney disease), stage III   Hematochezia   Acute blood loss anemia  DVT (deep vein thrombosis) in pregnancy     LOS: 3 days   Levin Erp  11/06/2015, 8:49 AM  Pager # [phone]

## 2015-11-06 NOTE — Transfer of Care (Signed)
Immediate Anesthesia Transfer of Care Note  Patient: Yolanda [name]  Procedure(s) Performed: Procedure(s) with comments: COLONOSCOPY (N/A) - MAC if available, otherwise moderate sedation  Patient Location: PACU  Anesthesia Type:MAC  Level of Consciousness: awake, alert  and oriented  Airway & Oxygen Therapy: Patient Spontanous Breathing and Patient connected to face mask oxygen  Post-op Assessment: Report given to RN and Post -op Vital signs reviewed and stable  Post vital signs: Reviewed and stable  Last Vitals:  Filed Vitals:   11/05/15 2110 11/06/15 0550  BP: 115/45 115/58  Pulse: 79 89  Temp: 36.9 C 36.4 C  Resp: 18 18    Last Pain:  Filed Vitals:   11/06/15 1133  PainSc: 0-No pain         Complications: No apparent anesthesia complications

## 2015-11-06 NOTE — Anesthesia Postprocedure Evaluation (Signed)
Anesthesia Post Note  Patient: Yolanda [name]  Procedure(s) Performed: Procedure(s) (LRB): COLONOSCOPY (N/A)  Patient location during evaluation: Endoscopy Anesthesia Type: MAC Level of consciousness: awake and alert Pain management: pain level controlled Vital Signs Assessment: post-procedure vital signs reviewed and stable Respiratory status: spontaneous breathing, nonlabored ventilation, respiratory function stable and patient connected to nasal cannula oxygen Cardiovascular status: stable and blood pressure returned to baseline Anesthetic complications: no    Last Vitals:  Filed Vitals:   11/06/15 1356 11/06/15 1400  BP: 120/46 128/51  Pulse: 72 78  Temp: 36.4 C   Resp: 24 20    Last Pain:  Filed Vitals:   11/06/15 1407  PainSc: 0-No pain                 Catalina Gravel

## 2015-11-06 NOTE — Anesthesia Preprocedure Evaluation (Addendum)
Anesthesia Evaluation  Patient identified by MRN, date of birth, ID band Patient awake    Reviewed: Allergy & Precautions, NPO status , Patient's Chart, lab work & pertinent test results  Airway Mallampati: III  TM Distance: >3 FB Neck ROM: Full    Dental  (+) Teeth Intact, Dental Advisory Given   Pulmonary asthma ,    Pulmonary exam normal breath sounds clear to auscultation       Cardiovascular hypertension, Pt. on medications + DVT  Normal cardiovascular exam Rhythm:Regular Rate:Normal     Neuro/Psych negative neurological ROS     GI/Hepatic Neg liver ROS, BRBPR   Endo/Other  negative endocrine ROS  Renal/GU Renal InsufficiencyRenal disease     Musculoskeletal negative musculoskeletal ROS (+)   Abdominal   Peds  Hematology  (+) Blood dyscrasia (thrombocytopenia), anemia ,   Anesthesia Other Findings Day of surgery medications reviewed with the patient.  Reproductive/Obstetrics                          Anesthesia Physical Anesthesia Plan  ASA: II  Anesthesia Plan: MAC   Post-op Pain Management:    Induction: Intravenous  Airway Management Planned: Nasal Cannula  Additional Equipment:   Intra-op Plan:   Post-operative Plan:   Informed Consent: I have reviewed the patients History and Physical, chart, labs and discussed the procedure including the risks, benefits and alternatives for the proposed anesthesia with the patient or authorized representative who has indicated his/her understanding and acceptance.   Dental advisory given  Plan Discussed with: CRNA and Anesthesiologist  Anesthesia Plan Comments: (Discussed risks/benefits/alternatives to MAC sedation including need for ventilatory support, hypotension, need for conversion to general anesthesia.  All patient questions answered.  Patient/guardian wishes to proceed.)        Anesthesia Quick Evaluation

## 2015-11-06 NOTE — Progress Notes (Signed)
[facility] PROGRESS NOTE    Progress Note  Yolanda [name]  [id number] DOB: August 14, 1925 DOA: 11/03/2015 PCP: Criselda Peaches, MD     Brief Narrative:   Yolanda [name] is an 82 y.o. female past history of asthma who presents with a couple of days of leg discomfort and discrepancy inside of her legs was found to have a lower extremity DVT.  Assessment/Plan:   DVT (deep venous thrombosis), left Hold heparin. She had minor rectal bleeding (painless hematochezia), consult GI for colonoscopy today. Hbg stable.  Essential hypertension Resume blood pressure medications.  Hyperlipidemia: Continue statins.  Bursitis of right hip Continue narcotics and steroids for pain. Awaiting physical therapy evaluation.  Troponin level elevated Cardiac biomarkers mildly elevated flat in the setting of chronic renal disease, no complains of chest pain or shortness of breath.  Stasis edema of both lower extremities  CKD stage III: At baseline  DVT prophylaxis: heparin Family Communication:none Disposition Plan/Barrier to D/C: home 1-2 days Code Status:     Code Status Orders        Start     Ordered   11/04/15 0128  Do not attempt resuscitation (DNR)   Continuous    Question Answer Comment  In the event of cardiac or respiratory ARREST Do not call a "code blue"   In the event of cardiac or respiratory ARREST Do not perform Intubation, CPR, defibrillation or ACLS   In the event of cardiac or respiratory ARREST Use medication by any route, position, wound care, and other measures to relive pain and suffering. May use oxygen, suction and manual treatment of airway obstruction as needed for comfort.      11/04/15 0127    Code Status History    Date Active Date Inactive Code Status Order ID Comments User Context   This patient has a current code status but no historical code status.        IV Access:    Peripheral IV   Procedures and diagnostic studies:    No results found.   Medical Consultants:    None.  Anti-Infectives:   none  Subjective:    Yolanda [name] no further bleeding or melena.   Objective:    Filed Vitals:   11/05/15 1520 11/05/15 1845 11/05/15 2110 11/06/15 0550  BP: 115/43 118/53 115/45 115/58  Pulse: 77 77 79 89  Temp: 98.7 F (37.1 C) 98.6 F (37 C) 98.4 F (36.9 C) 97.6 F (36.4 C)  TempSrc: Oral Oral Oral Oral  Resp: 15 16 18 18   Height:      Weight:      SpO2: 100% 100% 98% 97%    Intake/Output Summary (Last 24 hours) at 11/06/15 0815 Last data filed at 11/05/15 1154  Gross per 24 hour  Intake    363 ml  Output      0 ml  Net    363 ml   Filed Weights   11/04/15 0500  Weight: 63.9 kg (140 lb 14 oz)    Exam: General exam: In no acute distress. Respiratory system: Good air movement and clear to auscultation. Cardiovascular system: S1 & S2 heard, RRR. No JVD. Gastrointestinal system: Abdomen is nondistended, soft and nontender.  Central nervous system: Alert and oriented. No focal neurological deficits. Extremities: Asymmetrical lower extremity swelling left greater than right Psychiatry: Judgement and insight appear normal. Mood & affect appropriate.    Data Reviewed:    Labs: Basic Metabolic Panel:  Recent Labs Lab 11/03/15 1226  11/03/15 2054 11/04/15 0452 11/06/15 0429  NA 138 139 140 142  K 4.8 4.1 3.8 4.0  CL 102 104 108 112*  CO2 24 25 25  21*  GLUCOSE 148* 113* 87 241*  BUN 56* 55* 54* 47*  CREATININE 1.63* 1.62* 1.46* 1.32*  CALCIUM 9.9 10.0 9.0 8.5*   GFR Estimated Creatinine Clearance: 26.5 mL/min (by C-G formula based on Cr of 1.32). Liver Function Tests:  Recent Labs Lab 11/03/15 1226 11/03/15 2054  AST 20 25  ALT 18 21  ALKPHOS 44 43  BILITOT 0.8 1.2  PROT 6.0* 5.7*  ALBUMIN 3.6 3.4*   No results for input(s): LIPASE, AMYLASE in the last 168 hours. No results for input(s): AMMONIA in the last 168 hours. Coagulation profile  Recent  Labs Lab 11/03/15 2054 11/04/15 0452  INR 1.06 1.09    CBC:  Recent Labs Lab 11/03/15 2054 11/04/15 0452 11/05/15 0225 11/05/15 0453 11/06/15 0429  WBC 11.4* 8.5 8.5 10.1 10.5  NEUTROABS 8.7*  --   --   --   --   HGB 12.4 10.6* 9.9* 10.1* 11.0*  HCT 36.8 31.3* 30.1* 30.9* 33.5*  MCV 85.2 84.4 87.2 88.0 86.8  PLT 133* 124* 125* 115* 139*   Cardiac Enzymes:  Recent Labs Lab 11/03/15 2054  TROPONINI 0.17*   BNP (last 3 results) No results for input(s): PROBNP in the last 8760 hours. CBG: No results for input(s): GLUCAP in the last 168 hours. D-Dimer: No results for input(s): DDIMER in the last 72 hours. Hgb A1c: No results for input(s): HGBA1C in the last 72 hours. Lipid Profile: No results for input(s): CHOL, HDL, LDLCALC, TRIG, CHOLHDL, LDLDIRECT in the last 72 hours. Thyroid function studies: No results for input(s): TSH, T4TOTAL, T3FREE, THYROIDAB in the last 72 hours.  Invalid input(s): FREET3 Anemia work up: No results for input(s): VITAMINB12, FOLATE, FERRITIN, TIBC, IRON, RETICCTPCT in the last 72 hours. Sepsis Labs:  Recent Labs Lab 11/04/15 0452 11/05/15 0225 11/05/15 0453 11/06/15 0429  WBC 8.5 8.5 10.1 10.5   Microbiology Recent Results (from the past 240 hour(s))  MRSA PCR Screening     Status: None   Collection Time: 11/03/15  6:17 PM  Result Value Ref Range Status   MRSA by PCR NEGATIVE NEGATIVE Final    Comment:        The GeneXpert MRSA Assay (FDA approved for NASAL specimens only), is one component of a comprehensive MRSA colonization surveillance program. It is not intended to diagnose MRSA infection nor to guide or monitor treatment for MRSA infections.      Medications:   . aspirin EC  81 mg Oral Daily  . famotidine  40 mg Oral Daily  . furosemide  20 mg Oral Daily  . predniSONE  5 mg Oral TID  . simvastatin  10 mg Oral q1800  . sodium chloride flush  3 mL Intravenous Q12H  . terazosin  5 mg Oral QHS   Continuous  Infusions:    Time spent: 15 min   LOS: 3 days   Charlynne Cousins  [facility] Pager [phone]  *Please refer to [email or web address], password [email or web address] to get updated schedule on who will round on this patient, as hospitalists switch teams weekly. If 7PM-7AM, please contact night-coverage at [email or web address], password [email or web address] for any overnight needs.  11/06/2015, 8:15 AM

## 2015-11-06 NOTE — Progress Notes (Signed)
Need prior authorization for Lovenox.  Information was sent to [facility].

## 2015-11-07 ENCOUNTER — Telehealth: Payer: Self-pay | Admitting: Physician Assistant

## 2015-11-07 ENCOUNTER — Encounter (HOSPITAL_COMMUNITY): Payer: Self-pay | Admitting: Gastroenterology

## 2015-11-07 DIAGNOSIS — D62 Acute posthemorrhagic anemia: Secondary | ICD-10-CM | POA: Diagnosis not present

## 2015-11-07 DIAGNOSIS — N183 Chronic kidney disease, stage 3 (moderate): Secondary | ICD-10-CM | POA: Diagnosis not present

## 2015-11-07 DIAGNOSIS — Q2733 Arteriovenous malformation of digestive system vessel: Secondary | ICD-10-CM

## 2015-11-07 DIAGNOSIS — I82402 Acute embolism and thrombosis of unspecified deep veins of left lower extremity: Secondary | ICD-10-CM | POA: Diagnosis not present

## 2015-11-07 DIAGNOSIS — L89222 Pressure ulcer of left hip, stage 2: Secondary | ICD-10-CM | POA: Insufficient documentation

## 2015-11-07 MED ORDER — APIXABAN 5 MG PO TABS: ORAL_TABLET | ORAL | Status: DC

## 2015-11-07 MED ORDER — HYDROCORTISONE ACETATE 25 MG RE SUPP: 25.0000 mg | Freq: Every day | RECTAL | Status: DC

## 2015-11-07 MED ORDER — APIXABAN 2.5 MG PO TABS
10.0000 mg | ORAL_TABLET | Freq: Two times a day (BID) | ORAL | Status: DC
  Administered 2015-11-07: 10 mg via ORAL
  Filled 2015-11-07: qty 4

## 2015-11-07 MED ORDER — APIXABAN 2.5 MG PO TABS: 5.0000 mg | ORAL_TABLET | Freq: Two times a day (BID) | ORAL | Status: DC

## 2015-11-07 NOTE — Discharge Instructions (Signed)
Information on my medicine - ELIQUIS (apixaban)  This medication education was reviewed with me or my healthcare representative as part of my discharge preparation.    Why was Eliquis prescribed for you? Eliquis was prescribed to treat blood clots that may have been found in the veins of your legs (deep vein thrombosis) or in your lungs (pulmonary embolism) and to reduce the risk of them occurring again.  What do You need to know about Eliquis ? The starting dose is 10 mg (two 5 mg tablets) taken TWICE daily for the FIRST SEVEN (7) DAYS, then on 11/14/15  the dose is reduced to ONE 5 mg tablet taken TWICE daily.  Eliquis may be taken with or without food.   Try to take the dose about the same time in the morning and in the evening. If you have difficulty swallowing the tablet whole please discuss with your pharmacist how to take the medication safely.  Take Eliquis exactly as prescribed and DO NOT stop taking Eliquis without talking to the doctor who prescribed the medication.  Stopping may increase your risk of developing a new blood clot.  Refill your prescription before you run out.  After discharge, you should have regular check-up appointments with your healthcare provider that is prescribing your Eliquis.    What do you do if you miss a dose? If a dose of ELIQUIS is not taken at the scheduled time, take it as soon as possible on the same day and twice-daily administration should be resumed. The dose should not be doubled to make up for a missed dose.  Important Safety Information A possible side effect of Eliquis is bleeding. You should call your healthcare provider right away if you experience any of the following: ? Bleeding from an injury or your nose that does not stop. ? Unusual colored urine (red or dark brown) or unusual colored stools (red or black). ? Unusual bruising for unknown reasons. ? A serious fall or if you hit your head (even if there is no bleeding).  Some  medicines may interact with Eliquis and might increase your risk of bleeding or clotting while on Eliquis. To help avoid this, consult your healthcare provider or pharmacist prior to using any new prescription or non-prescription medications, including herbals, vitamins, non-steroidal anti-inflammatory drugs (NSAIDs) and supplements.  This website has more information on Eliquis (apixaban): http://www.eliquis.com/eliquis/home

## 2015-11-07 NOTE — Progress Notes (Signed)
Physical Therapy Treatment Patient Details Name: Yolanda [name] MRN: [id number] DOB: 10/07/1925 Today's Date: 11/07/2015    History of Present Illness Yolanda [name] is a 82 y.o. Caucasian female with a past med hx significant for HTN, HLD, asthma, diverticulosis, adenomatous polyps and cecal AVM's who presented to the ER on 11/03/15 with a complaint of "a couple days of left leg discomfort and swelling.". At time of admission she was found to have a DVT in both legs and was started on heparin drip which is DC'd due to new onset of hematochezia    PT Comments    Pt feeling much better and B LE edema has decreased.  Pt tolerated amb a greater distance with min c/o's.    Follow Up Recommendations  Other (comment) (Cornelius, will update LPT pt's plan)     Equipment Recommendations       Recommendations for Other Services       Precautions / Restrictions Precautions Precautions: Fall Restrictions Weight Bearing Restrictions: No    Mobility  Bed Mobility Overal bed mobility: Needs Assistance Bed Mobility: Supine to Sit     Supine to sit: Supervision     General bed mobility comments: increased time pt was able to self assist to EOB  Transfers Overall transfer level: Needs assistance Equipment used: Rolling walker (2 wheeled) Transfers: Sit to/from Stand Sit to Stand: Supervision Stand pivot transfers: Supervision       General transfer comment: pt able to self rise using B UE's.  assisted on/off toilet with ease.    Ambulation/Gait Ambulation/Gait assistance: Supervision;Min guard Ambulation Distance (Feet): 125 Feet (one sitting rest break) Assistive device: Rolling walker (2 wheeled) Gait Pattern/deviations: Step-through pattern;Decreased stride length;Trunk flexed Gait velocity: WFL   General Gait Details: tolerated increased distance.  Steady.  No LOB.  Appropriate use of walker.     Stairs            Wheelchair Mobility     Modified Rankin (Stroke Patients Only)       Balance                                    Cognition Arousal/Alertness: Awake/alert Behavior During Therapy: WFL for tasks assessed/performed Overall Cognitive Status: Within Functional Limits for tasks assessed                      Exercises      General Comments        Pertinent Vitals/Pain Pain Assessment: No/denies pain    Home Living                      Prior Function            PT Goals (current goals can now be found in the care plan section) Progress towards PT goals: Progressing toward goals    Frequency  Min 3X/week    PT Plan Current plan remains appropriate    Co-evaluation             End of Session Equipment Utilized During Treatment: Gait belt Activity Tolerance: Patient tolerated treatment well Patient left: in chair;with chair alarm set     Time: 1002-1026 PT Time Calculation (min) (ACUTE ONLY): 24 min  Charges:  $Gait Training: 8-22 mins  G Codes:      Rica Koyanagi  PTA [facility]  Acute  Rehab Pager      [phone]

## 2015-11-07 NOTE — Progress Notes (Signed)
CSW met with pt to assist with d/c planning. Pt plans to go to respite at [facility] when d/c today. CSW spoke with PT for recommendations. PT reports that pt is doing much better than yesterday and does not require SNF placement. [facility] is aware pt will be d/c today. Pt has friends that will provide transportation. CSW signing off.  Werner Lean LCSW [phone]

## 2015-11-07 NOTE — Progress Notes (Signed)
ANTICOAGULATION CONSULT NOTE - Follow Up  Pharmacy Consult for Heparin --> Eliquis Indication: new DVT  Allergies  Allergen Reactions  . Epinephrine Nausea Only    Unknown.  Marland Kitchen Hydrocodone-Acetaminophen     REACTION: nausea   Patient Measurements: Height: 5\' 3"  (160 cm) Weight: 140 lb 14 oz (63.9 kg) IBW/kg (Calculated) : 52.4  Vital Signs: Temp: 98 F (36.7 C) (06/27 0433) Temp Source: Oral (06/27 0433) BP: 129/69 mmHg (06/27 0433) Pulse Rate: 69 (06/27 0433)  Labs:  Recent Labs  11/04/15 1900  11/05/15 0225 11/05/15 0453 11/06/15 0429  HGB  --   < > 9.9* 10.1* 11.0*  HCT  --   --  30.1* 30.9* 33.5*  PLT  --   --  125* 115* 139*  HEPARINUNFRC 0.70  --   --  0.74*  --   CREATININE  --   --   --   --  1.32*  < > = values in this interval not displayed. Estimated Creatinine Clearance: 26.5 mL/min (by C-G formula based on Cr of 1.32).  Medications:  Scheduled:  . aspirin EC  81 mg Oral Daily  . famotidine  40 mg Oral Daily  . furosemide  20 mg Oral Daily  . hydrocortisone  25 mg Rectal QHS  . predniSONE  5 mg Oral TID  . simvastatin  10 mg Oral q1800  . sodium chloride flush  3 mL Intravenous Q12H  . terazosin  5 mg Oral QHS   Assessment: Patient with new LLE DVT, seen on ultrasound from urgent care. To ED with concern for RLE DVT- confirmed by doppler at [facility].  VQ scan low probability for PE (no CT with renal insufficiency).  Patient also had hematochezia with diverticulosis without ulceration noted on colonoscopy on 6/26.  Patient was started on heparin on admission and held on 6/25 for colonoscopy procedure.  Per MD, to resume AC back with Eliquis today.  Today, 11/07/2015 - Hgb and plt improving - scr trending down 1.32 (crcl~26-30) - no significant drug-drug intxns  Goal of Therapy:  Heparin level 0.3-0.7 units/ml Monitor platelets by anticoagulation protocol: Yes   Plan:  - Eliquis 10 mg PO BID x7 days, then 5 mg bid - monitor for s/s bleeding - f/u  renal function  Dia Sitter, PharmD, BCPS 11/07/2015 8:27 AM

## 2015-11-07 NOTE — Progress Notes (Signed)
Report given to Elmyra Ricks at [facility]. All questions answered. Pt alert and oriented and in NAD at time of discharged. Pt discharged with a friend to facility.

## 2015-11-07 NOTE — Discharge Summary (Addendum)
Physician Discharge Summary  Yolanda [name] [id number] DOB: [birth year] DOA: 11/03/2015  PCP: Criselda Peaches, MD  Admit date: 11/03/2015 Discharge date: 11/07/2015  Time spent: 35* minutes  Recommendations for Outpatient Follow-up:  1. Follow-up with GI in 2-4 weeks. 2. Follow-up with PCP in 2 weeks check CBC.   Discharge Diagnoses:  Principal Problem:   DVT (deep venous thrombosis), left Active Problems:   Essential hypertension   Hyperlipidemia   Bursitis of right hip   Troponin level elevated   Stasis edema of both lower extremities   CKD (chronic kidney disease), stage III   Hematochezia   Acute blood loss anemia   AVM (arteriovenous malformation) of colon without hemorrhage   AVM (arteriovenous malformation) of colon with hemorrhage   Pressure ulcer   Discharge Condition: stable  Diet recommendation: regular  Filed Weights   11/04/15 0500  Weight: 63.9 kg (140 lb 14 oz)    History of present illness:  82 year old female with past medical history significant for asthma who presents with like left discomfort for the last several days prior to admission.  Hospital Course:  DVT (deep venous thrombosis), left/painless hematochezia: Lower extremity DVT was done that showed left lower extremity DVT she was started on IV heparin, she then started having bright red blood per rectum heparin was stopped GI was consulted who recommended a Colonoscopy performed on 10/31/2015 that showed diverticulosis without ulceration found on the perineal exam with colonic angiodysplasia that was treated with argon plasma. Her hemoglobin remained stable GI recommended to resume Eluquis and follow-up with them in 2 weeks as an outpatient.   Essential hypertension Resume blood pressure medications. No changes were made.  Hyperlipidemia: Continue statins.  Bursitis of right hip Continue narcotics and steroids for pain. Awaiting physical therapy evaluation.  Troponin level  elevated Cardiac biomarkers mildly elevated flat in the setting of chronic renal disease, no complains of chest pain or shortness of breath.  Stasis edema of both lower extremities  CKD stage III: At baseline  Procedures:  ECHO  Lower ext dvt  Consultations:  GI  Discharge Exam: Filed Vitals:   11/06/15 2212 11/07/15 0433  BP: 136/48 129/69  Pulse: 93 69  Temp: 98.4 F (36.9 C) 98 F (36.7 C)  Resp: 16 16    General: A&O x3 Cardiovascular: RRR Respiratory: good air movement CTA B/L  Discharge Instructions   Discharge Instructions    Diet - low sodium heart healthy    Complete by:  As directed      Increase activity slowly    Complete by:  As directed           Discharge Medication List as of 11/07/2015 12:34 PM    CONTINUE these medications which have CHANGED   Details  apixaban (ELIQUIS) 5 MG TABS tablet Take 10 mg twice a day for 1 week then 5 mg twice a day, Print    hydrocortisone (ANUSOL-HC) 25 MG suppository Place 1 suppository (25 mg total) rectally at bedtime., Starting 11/07/2015, Until Discontinued, Print      CONTINUE these medications which have NOT CHANGED   Details  furosemide (LASIX) 20 MG tablet Take 1 tablet (20 mg total) by mouth daily., Starting 11/03/2015, Until Discontinued, Normal    lisinopril (PRINIVIL,ZESTRIL) 40 MG tablet Take 40 mg by mouth daily., Until Discontinued, Historical Med    Multiple Vitamins-Minerals (EYE VITAMINS) CAPS Take 1 capsule by mouth daily., Until Discontinued, Historical Med    predniSONE (DELTASONE) 5 MG tablet Take 5  mg by mouth 3 (three) times daily., Until Discontinued, Historical Med    simvastatin (ZOCOR) 10 MG tablet Take 10 mg by mouth daily., Until Discontinued, Historical Med    terazosin (HYTRIN) 5 MG capsule Take 5 mg by mouth at bedtime., Until Discontinued, Historical Med      STOP taking these medications     aspirin 81 MG tablet        Allergies  Allergen Reactions  . Epinephrine  Nausea Only    Unknown.  Marland Kitchen Hydrocodone-Acetaminophen     REACTION: nausea      The results of significant diagnostics from this hospitalization (including imaging, microbiology, ancillary and laboratory) are listed below for reference.    Significant Diagnostic Studies: Dg Chest 2 View  11/03/2015  CLINICAL DATA:  Lower extremity edema ; history of hypertension and hyperlipidemia EXAM: CHEST  2 VIEW COMPARISON:  PA and lateral chest x-ray of June 02, 2004. FINDINGS: The lungs remain hyperinflated. There is no focal infiltrate. There is no pleural effusion. The heart and pulmonary vascularity are normal. The mediastinum is normal in width. There is calcification in the wall of the aortic arch. There is a small hiatal hernia. There is multilevel degenerative disc disease and mild dextrocurvature of the thoracic spine. IMPRESSION: 1. COPD-reactive airway disease. There is no evidence of pneumonia nor CHF. 2. Aortic atherosclerosis. 3. Small hiatal hernia. Electronically Signed   By: David  Martinique M.D.   On: 11/03/2015 12:58   Nm Pulmonary Perf And Vent  11/04/2015  CLINICAL DATA:  82 year old female with history of DVT. No chest pain or shortness of breath EXAM: NUCLEAR MEDICINE VENTILATION - PERFUSION LUNG SCAN TECHNIQUE: Ventilation images were obtained in multiple projections using inhaled aerosol Tc-47m DTPA. Perfusion images were obtained in multiple projections after intravenous injection of Tc-2m MAA. RADIOPHARMACEUTICALS:  31.8 mCi Technetium-72m DTPA aerosol inhalation and 4.1 mCi Technetium-3m MAA IV COMPARISON:  Chest radiograph dated 11/03/2015 FINDINGS: Ventilation: No focal ventilation defect. Perfusion: No wedge shaped peripheral perfusion defects to suggest acute pulmonary embolism. IMPRESSION: Low probability for pulmonary embolism. Electronically Signed   By: Anner Crete M.D.   On: 11/04/2015 01:30   US Venous Img Lower Unilateral Left  11/03/2015  CLINICAL DATA:  Left  lower extremity bruising and swelling for three days EXAM: LEFT LOWER EXTREMITY VENOUS DOPPLER ULTRASOUND TECHNIQUE: Gray-scale sonography with graded compression, as well as color Doppler and duplex ultrasound were performed to evaluate the lower extremity deep venous systems from the level of the common femoral vein and including the common femoral, femoral, profunda femoral, popliteal and calf veins including the posterior tibial, peroneal and gastrocnemius veins when visible. The superficial great saphenous vein was also interrogated. Spectral Doppler was utilized to evaluate flow at rest and with distal augmentation maneuvers in the common femoral, femoral and popliteal veins. COMPARISON:  None. FINDINGS: Contralateral Common Femoral Vein: Respiratory phasicity is normal and symmetric with the symptomatic side. No evidence of thrombus. Normal compressibility. Common Femoral Vein: Partially occlusive thrombus with decreased compressibility. Saphenofemoral Junction: Partially occlusive thrombus with decreased compressability. Profunda Femoral Vein: The vein is not compressible and is occluded by thrombus. Femoral Vein: The vein is not compressible and is occluded by thrombus. Popliteal Vein: Partially occlusive thrombus with decreased compressability. Calf Veins:  Not evaluated well due to significant calf edema. Other Findings:  Significant soft tissue edema. IMPRESSION: Thrombus through the left lower extremity deep venous system from common femoral vein through popliteal vein including profunda femoral vein. Electronically Signed  By: Skipper Cliche M.D.   On: 11/03/2015 16:41    Microbiology: Recent Results (from the past 240 hour(s))  MRSA PCR Screening     Status: None   Collection Time: 11/03/15  6:17 PM  Result Value Ref Range Status   MRSA by PCR NEGATIVE NEGATIVE Final    Comment:        The GeneXpert MRSA Assay (FDA approved for NASAL specimens only), is one component of a comprehensive  MRSA colonization surveillance program. It is not intended to diagnose MRSA infection nor to guide or monitor treatment for MRSA infections.      Labs: Basic Metabolic Panel:  Recent Labs Lab 11/03/15 1226 11/03/15 2054 11/04/15 0452 11/06/15 0429  NA 138 139 140 142  K 4.8 4.1 3.8 4.0  CL 102 104 108 112*  CO2 24 25 25  21*  GLUCOSE 148* 113* 87 241*  BUN 56* 55* 54* 47*  CREATININE 1.63* 1.62* 1.46* 1.32*  CALCIUM 9.9 10.0 9.0 8.5*   Liver Function Tests:  Recent Labs Lab 11/03/15 1226 11/03/15 2054  AST 20 25  ALT 18 21  ALKPHOS 44 43  BILITOT 0.8 1.2  PROT 6.0* 5.7*  ALBUMIN 3.6 3.4*   No results for input(s): LIPASE, AMYLASE in the last 168 hours. No results for input(s): AMMONIA in the last 168 hours. CBC:  Recent Labs Lab 11/03/15 2054 11/04/15 0452 11/05/15 0225 11/05/15 0453 11/06/15 0429  WBC 11.4* 8.5 8.5 10.1 10.5  NEUTROABS 8.7*  --   --   --   --   HGB 12.4 10.6* 9.9* 10.1* 11.0*  HCT 36.8 31.3* 30.1* 30.9* 33.5*  MCV 85.2 84.4 87.2 88.0 86.8  PLT 133* 124* 125* 115* 139*   Cardiac Enzymes:  Recent Labs Lab 11/03/15 2054  TROPONINI 0.17*   BNP: BNP (last 3 results)  Recent Labs  11/03/15 1226  BNP 70.1    ProBNP (last 3 results) No results for input(s): PROBNP in the last 8760 hours.  CBG: No results for input(s): GLUCAP in the last 168 hours.     Signed:  Charlynne Cousins MD.  [facility] 11/07/2015, 4:15 PM

## 2015-11-07 NOTE — Telephone Encounter (Signed)
Patient called, no answer. Will try again tomorrow.

## 2015-11-08 ENCOUNTER — Telehealth: Payer: Self-pay | Admitting: Physician Assistant

## 2015-11-09 DIAGNOSIS — I82409 Acute embolism and thrombosis of unspecified deep veins of unspecified lower extremity: Secondary | ICD-10-CM | POA: Diagnosis not present

## 2015-11-09 DIAGNOSIS — R799 Abnormal finding of blood chemistry, unspecified: Secondary | ICD-10-CM | POA: Diagnosis not present

## 2015-11-09 DIAGNOSIS — I119 Hypertensive heart disease without heart failure: Secondary | ICD-10-CM | POA: Diagnosis not present

## 2015-11-09 DIAGNOSIS — D559 Anemia due to enzyme disorder, unspecified: Secondary | ICD-10-CM | POA: Diagnosis not present

## 2015-11-09 DIAGNOSIS — K2211 Ulcer of esophagus with bleeding: Secondary | ICD-10-CM | POA: Diagnosis not present

## 2015-11-09 DIAGNOSIS — E7800 Pure hypercholesterolemia, unspecified: Secondary | ICD-10-CM | POA: Diagnosis not present

## 2015-11-10 ENCOUNTER — Encounter: Payer: Self-pay | Admitting: Physician Assistant

## 2015-11-13 DIAGNOSIS — I82409 Acute embolism and thrombosis of unspecified deep veins of unspecified lower extremity: Secondary | ICD-10-CM | POA: Diagnosis not present

## 2015-11-13 DIAGNOSIS — E1165 Type 2 diabetes mellitus with hyperglycemia: Secondary | ICD-10-CM | POA: Diagnosis not present

## 2015-11-13 DIAGNOSIS — K2971 Gastritis, unspecified, with bleeding: Secondary | ICD-10-CM | POA: Diagnosis not present

## 2015-11-13 DIAGNOSIS — I119 Hypertensive heart disease without heart failure: Secondary | ICD-10-CM | POA: Diagnosis not present

## 2015-11-13 DIAGNOSIS — E7800 Pure hypercholesterolemia, unspecified: Secondary | ICD-10-CM | POA: Diagnosis not present

## 2015-11-13 DIAGNOSIS — D559 Anemia due to enzyme disorder, unspecified: Secondary | ICD-10-CM | POA: Diagnosis not present

## 2015-11-13 DIAGNOSIS — E109 Type 1 diabetes mellitus without complications: Secondary | ICD-10-CM | POA: Diagnosis not present

## 2015-11-13 DIAGNOSIS — R799 Abnormal finding of blood chemistry, unspecified: Secondary | ICD-10-CM | POA: Diagnosis not present

## 2015-11-13 NOTE — Telephone Encounter (Signed)
Pt called, no answer. Letter was sent to pt with results.

## 2015-11-27 DIAGNOSIS — K2971 Gastritis, unspecified, with bleeding: Secondary | ICD-10-CM | POA: Diagnosis not present

## 2015-11-27 DIAGNOSIS — I82409 Acute embolism and thrombosis of unspecified deep veins of unspecified lower extremity: Secondary | ICD-10-CM | POA: Diagnosis not present

## 2015-11-27 DIAGNOSIS — E1165 Type 2 diabetes mellitus with hyperglycemia: Secondary | ICD-10-CM | POA: Diagnosis not present

## 2015-11-27 DIAGNOSIS — N19 Unspecified kidney failure: Secondary | ICD-10-CM | POA: Diagnosis not present

## 2015-12-04 DIAGNOSIS — S30810A Abrasion of lower back and pelvis, initial encounter: Secondary | ICD-10-CM | POA: Diagnosis not present

## 2015-12-04 DIAGNOSIS — M7981 Nontraumatic hematoma of soft tissue: Secondary | ICD-10-CM | POA: Diagnosis not present

## 2015-12-08 DIAGNOSIS — M6281 Muscle weakness (generalized): Secondary | ICD-10-CM | POA: Diagnosis not present

## 2015-12-08 DIAGNOSIS — E782 Mixed hyperlipidemia: Secondary | ICD-10-CM | POA: Diagnosis not present

## 2015-12-08 DIAGNOSIS — M25551 Pain in right hip: Secondary | ICD-10-CM | POA: Diagnosis not present

## 2015-12-08 DIAGNOSIS — R262 Difficulty in walking, not elsewhere classified: Secondary | ICD-10-CM | POA: Diagnosis not present

## 2015-12-08 DIAGNOSIS — I10 Essential (primary) hypertension: Secondary | ICD-10-CM | POA: Diagnosis not present

## 2015-12-08 DIAGNOSIS — M7071 Other bursitis of hip, right hip: Secondary | ICD-10-CM | POA: Diagnosis not present

## 2015-12-11 DIAGNOSIS — M25551 Pain in right hip: Secondary | ICD-10-CM | POA: Diagnosis not present

## 2015-12-11 DIAGNOSIS — R262 Difficulty in walking, not elsewhere classified: Secondary | ICD-10-CM | POA: Diagnosis not present

## 2015-12-11 DIAGNOSIS — E782 Mixed hyperlipidemia: Secondary | ICD-10-CM | POA: Diagnosis not present

## 2015-12-11 DIAGNOSIS — M7071 Other bursitis of hip, right hip: Secondary | ICD-10-CM | POA: Diagnosis not present

## 2015-12-11 DIAGNOSIS — M6281 Muscle weakness (generalized): Secondary | ICD-10-CM | POA: Diagnosis not present

## 2015-12-11 DIAGNOSIS — I10 Essential (primary) hypertension: Secondary | ICD-10-CM | POA: Diagnosis not present

## 2015-12-12 DIAGNOSIS — M7071 Other bursitis of hip, right hip: Secondary | ICD-10-CM | POA: Diagnosis not present

## 2015-12-12 DIAGNOSIS — E782 Mixed hyperlipidemia: Secondary | ICD-10-CM | POA: Diagnosis not present

## 2015-12-12 DIAGNOSIS — R262 Difficulty in walking, not elsewhere classified: Secondary | ICD-10-CM | POA: Diagnosis not present

## 2015-12-12 DIAGNOSIS — M6281 Muscle weakness (generalized): Secondary | ICD-10-CM | POA: Diagnosis not present

## 2015-12-12 DIAGNOSIS — M25551 Pain in right hip: Secondary | ICD-10-CM | POA: Diagnosis not present

## 2015-12-12 DIAGNOSIS — D559 Anemia due to enzyme disorder, unspecified: Secondary | ICD-10-CM | POA: Diagnosis not present

## 2015-12-12 DIAGNOSIS — I10 Essential (primary) hypertension: Secondary | ICD-10-CM | POA: Diagnosis not present

## 2015-12-13 DIAGNOSIS — M7071 Other bursitis of hip, right hip: Secondary | ICD-10-CM | POA: Diagnosis not present

## 2015-12-13 DIAGNOSIS — E782 Mixed hyperlipidemia: Secondary | ICD-10-CM | POA: Diagnosis not present

## 2015-12-13 DIAGNOSIS — R262 Difficulty in walking, not elsewhere classified: Secondary | ICD-10-CM | POA: Diagnosis not present

## 2015-12-13 DIAGNOSIS — M6281 Muscle weakness (generalized): Secondary | ICD-10-CM | POA: Diagnosis not present

## 2015-12-13 DIAGNOSIS — M25551 Pain in right hip: Secondary | ICD-10-CM | POA: Diagnosis not present

## 2015-12-13 DIAGNOSIS — I10 Essential (primary) hypertension: Secondary | ICD-10-CM | POA: Diagnosis not present

## 2015-12-14 ENCOUNTER — Encounter: Payer: Self-pay | Admitting: Internal Medicine

## 2015-12-14 ENCOUNTER — Non-Acute Institutional Stay: Payer: Medicare Other | Admitting: Internal Medicine

## 2015-12-14 DIAGNOSIS — R739 Hyperglycemia, unspecified: Secondary | ICD-10-CM

## 2015-12-14 DIAGNOSIS — N183 Chronic kidney disease, stage 3 unspecified: Secondary | ICD-10-CM

## 2015-12-14 DIAGNOSIS — N390 Urinary tract infection, site not specified: Secondary | ICD-10-CM | POA: Diagnosis not present

## 2015-12-14 DIAGNOSIS — T148 Other injury of unspecified body region: Secondary | ICD-10-CM | POA: Diagnosis not present

## 2015-12-14 DIAGNOSIS — R58 Hemorrhage, not elsewhere classified: Secondary | ICD-10-CM | POA: Diagnosis not present

## 2015-12-14 DIAGNOSIS — I10 Essential (primary) hypertension: Secondary | ICD-10-CM | POA: Diagnosis not present

## 2015-12-14 DIAGNOSIS — M7071 Other bursitis of hip, right hip: Secondary | ICD-10-CM

## 2015-12-14 DIAGNOSIS — I87303 Chronic venous hypertension (idiopathic) without complications of bilateral lower extremity: Secondary | ICD-10-CM

## 2015-12-14 DIAGNOSIS — R32 Unspecified urinary incontinence: Secondary | ICD-10-CM

## 2015-12-14 DIAGNOSIS — I82402 Acute embolism and thrombosis of unspecified deep veins of left lower extremity: Secondary | ICD-10-CM | POA: Diagnosis not present

## 2015-12-14 DIAGNOSIS — K5521 Angiodysplasia of colon with hemorrhage: Secondary | ICD-10-CM

## 2015-12-14 DIAGNOSIS — Q2733 Arteriovenous malformation of digestive system vessel: Secondary | ICD-10-CM

## 2015-12-14 DIAGNOSIS — D62 Acute posthemorrhagic anemia: Secondary | ICD-10-CM

## 2015-12-14 DIAGNOSIS — T148XXA Other injury of unspecified body region, initial encounter: Secondary | ICD-10-CM

## 2015-12-14 DIAGNOSIS — R2681 Unsteadiness on feet: Secondary | ICD-10-CM | POA: Diagnosis not present

## 2015-12-14 NOTE — Progress Notes (Signed)
.   History and Physical  Provider:  Jeanmarie Hubert, MD   Location:    [facility] Room Number: [facility] Place of Service:  [facility] ([phone])  PCP: Jeanmarie Hubert, MD Patient Care Team: Estill Dooms, MD as PCP - General (Internal Medicine) Man Otho Darner, NP as Nurse Practitioner (Internal Medicine)  Extended Emergency Contact Information Primary Emergency Contact: Gerarda Fraction [place] Phone: [phone] Relation: Friend Secondary Emergency Contact: Ginger Carne [place] Phone: [phone] Relation: Friend  Code Status: DNR Goals of Care: Advanced Directive information Advanced Directives 12/14/2015  Does patient have an advance directive? Yes  Type of Paramedic of [place];Living will;Out of facility DNR (pink MOST or yellow form)  Copy of advanced directive(s) in chart? Yes  Would patient like information on creating an advanced directive? -      Chief Complaint  Patient presents with  . Medical Management of Chronic Issues    [place], switching primary physican to Dr. Jeanmarie Hubert   . Leg Swelling    bilateral. Was in hospital 11/03/15 to 11/07/15 for DVT left    HPI: Patient is a 82 y.o. female seen today for A complete exam following admission on 12/08/2015 2 [facility] assisted living as a transfer from independent living. Patient was previously seeing Dr. Zada Girt for the last 29 years.  She was hospitalized from 11/04/2015 through 11/07/2015 with a left DVT. Patient also had a GI hemorrhage while hospitalized. She underwent colonoscopy by Dr. Silverio Decamp. Findings included a decubitus ulceration found on perineal exam, a single recently bleeding colonic angiodysplastic lesion which was coagulated with argon laser, diverticulosis of the sigmoid, descending, and ascending colon, and medium internal hemorrhoids with stigmata of recent bleeding.   DVT discomfort has  resolved, but 3+ edema persists in both legs.  Patient is quite unsteady when walking. She does use a walker with 2 front wheels and rear skids. Despite this, she has had falls which resulted in skin tears.  Patient is tender at the right hip and says that she has a known chronic bronchitis. She is taking prednisone 5 mg 3 times daily to alleviate pain in this area.   The prednisone may cause some of the multiple ecchymoses that she has had fragility of the skin.  Patient had multiple elevated glucose values while hospitalized. No prior history of diabetes mellitus. Blood sugars seem to have returned to normal since her return home.  Past Medical History:  Diagnosis Date  . Acute blood loss anemia   . Allergy   . Asthma   . AVM (arteriovenous malformation) of colon with hemorrhage   . Bursitis of right hip   . Cataract   . CKD (chronic kidney disease), stage III 11/04/2015  . COLONIC POLYPS 03/26/2005   Qualifier: Diagnosis of  By: Talbert Cage CMA ([place]), June    . DIVERTICULOSIS, COLON 03/26/2005   Qualifier: Diagnosis of  By: Talbert Cage CMA ([place]), June    . DVT (deep venous thrombosis), left 11/03/2015  . Ecchymosis 12/16/2015  . Hyperglycemia 12/16/2015  . Hyperlipidemia   . Hypertension   . Multiple skin tears 12/16/2015  . Osteopenia   . Stasis edema of both lower extremities 11/04/2015  . Unstable gait 12/16/2015  . Urine incontinence 12/16/2015   Past Surgical History:  Procedure Laterality Date  . ABDOMINAL HYSTERECTOMY  2006   Dr. Maryelizabeth Rowan  . CATARACT EXTRACTION W/  INTRAOCULAR LENS IMPLANT Bilateral 1999/2003   Dr. Rutherford Guys  . COLONOSCOPY N/A 11/06/2015   Procedure: COLONOSCOPY;  Surgeon: Mauri Pole, MD;  Location: [facility];  Service: Endoscopy;  Laterality: N/A;  MAC if available, otherwise moderate sedation    reports that she has never smoked. She has never used smokeless tobacco. She reports that she does not drink alcohol or use drugs. Social History    Social History  . Marital status: Widowed    Spouse name: N/A  . Number of children: 2  . Years of education: N/A   Occupational History  . retired Art gallery manager    Social History Main Topics  . Smoking status: Never Smoker  . Smokeless tobacco: Never Used  . Alcohol use No  . Drug use: No  . Sexual activity: No     Comment: was until a few years ago   Other Topics Concern  . Not on file   Social History Narrative   Lives alone in an retirement community. [place]   Walks with a walker.   NOK: 2 sons with shared POA (they live far away) - Shanon Brow Dintinfass would be first call.   Never smoked   Alcohol none   Exercise none   POA, Living Will, MOST                 Functional Status Survey:    Family History  Problem Relation Age of Onset  . Heart attack Father 35    Health Maintenance  Topic Date Due  . Samul Dada  09/13/1944  . ZOSTAVAX  09/13/1987  . PNA vac Low Risk Adult (1 of 2 - PCV13) 09/12/1992  . INFLUENZA VACCINE  12/12/2015  . DEXA SCAN  Completed    Allergies  Allergen Reactions  . Epinephrine Nausea Only    Unknown.  Marland Kitchen Hydrocodone-Acetaminophen     REACTION: nausea  . Labetalol   . Procaine       Medication List       Accurate as of 12/14/15 10:02 AM. Always use your most recent med list.          ELIQUIS 5 MG Tabs tablet Generic drug:  apixaban Take 5 mg by mouth. Take one tablet twice daily   furosemide 20 MG tablet Commonly known as:  LASIX Take 1 tablet (20 mg total) by mouth daily.   predniSONE 5 MG tablet Commonly known as:  DELTASONE Take 5 mg by mouth 3 (three) times daily.   simvastatin 10 MG tablet Commonly known as:  ZOCOR Take 10 mg by mouth daily.       Review of Systems  Constitutional: Positive for activity change and fatigue. Negative for appetite change, chills, diaphoresis, fever and unexpected weight change.  HENT: Negative for congestion, ear discharge, ear pain, hearing loss,  nosebleeds, postnasal drip, rhinorrhea, sore throat, tinnitus, trouble swallowing and voice change.   Eyes: Negative for pain, discharge, redness, itching and visual disturbance.  Respiratory: Negative for cough, choking, shortness of breath and wheezing.   Cardiovascular: Positive for leg swelling (3+ bilateral lower legs). Negative for chest pain and palpitations.  Gastrointestinal: Positive for constipation. Negative for abdominal distention, abdominal pain, blood in stool, diarrhea, nausea and vomiting.       Diverticulosis coli. AV malformations of the colon. Internal hemorrhoids. History of GI bleed. History of decubitus ulceration in the perineal area. Last colonoscopy 11/06/2015 by Dr. Silverio Decamp.  Endocrine: Negative for cold intolerance, heat intolerance, polydipsia, polyphagia and  polyuria.  Genitourinary: Positive for decreased urine volume. Negative for difficulty urinating, dysuria, flank pain, frequency, hematuria, pelvic pain, urgency and vaginal discharge.       History of urinary incontinence. CKD 3.  Musculoskeletal: Positive for arthralgias and gait problem. Negative for back pain, myalgias, neck pain and neck stiffness.       Chronic bursitis right hip.  Skin: Positive for wound (Multiple skin tears). Negative for color change, pallor and rash.       Multiple ecchymoses  Allergic/Immunologic: Negative.  Negative for environmental allergies.  Neurological: Negative for dizziness, tremors, seizures, syncope, weakness, numbness and headaches.       Repetitive  Hematological: Negative for adenopathy. Does not bruise/bleed easily.       Acute blood loss anemia in June 2017.  Psychiatric/Behavioral: Negative for agitation, behavioral problems, confusion, dysphoric mood, hallucinations, sleep disturbance and suicidal ideas. The patient is nervous/anxious. The patient is not hyperactive.     Vitals:   12/14/15 0901  BP: 124/76  Pulse: 91  Resp: 17  Temp: 97.5 F (36.4 C)    Weight: 150 lb (68 kg)  Height: 5\' 3"  (1.6 m)   Body mass index is 26.57 kg/m. Physical Exam  Constitutional: She is oriented to person, place, and time. She appears well-developed and well-nourished. No distress.  Elderly and fragile.  HENT:  Right Ear: External ear normal.  Left Ear: External ear normal.  Nose: Nose normal.  Mouth/Throat: Oropharynx is clear and moist. No oropharyngeal exudate.  Eyes: Conjunctivae and EOM are normal. Pupils are equal, round, and reactive to light. No scleral icterus.  Neck: No JVD present. No tracheal deviation present. No thyromegaly present.  Cardiovascular: Normal rate, regular rhythm and normal heart sounds.  Exam reveals no gallop and no friction rub.   No murmur heard. Unable to feel DP and PT bilaterally due to edema.  Pulmonary/Chest: Effort normal. No respiratory distress. She has no wheezes. She has no rales. She exhibits no tenderness.  Abdominal: She exhibits no distension and no mass. There is no tenderness.  Musculoskeletal: Normal range of motion. She exhibits edema (3+ bipedal from the knee to the foot). She exhibits no tenderness.  Tender at the right hip and down the lateral right leg. Using walker with 2 front wheels and rear skids. Very unstable with walking.  Lymphadenopathy:    She has no cervical adenopathy.  Neurological: She is alert and oriented to person, place, and time. No cranial nerve deficit. Coordination abnormal.  Skin: No rash noted. She is not diaphoretic. No erythema. No pallor.  Psychiatric: She has a normal mood and affect. Her behavior is normal. Judgment and thought content normal.    Labs reviewed: Basic Metabolic Panel:  Recent Labs  11/03/15 2054 11/04/15 0452 11/06/15 0429  NA 139 140 142  K 4.1 3.8 4.0  CL 104 108 112*  CO2 25 25 21*  GLUCOSE 113* 87 241*  BUN 55* 54* 47*  CREATININE 1.62* 1.46* 1.32*  CALCIUM 10.0 9.0 8.5*   Liver Function Tests:  Recent Labs  11/03/15 1226  11/03/15 2054  AST 20 25  ALT 18 21  ALKPHOS 44 43  BILITOT 0.8 1.2  PROT 6.0* 5.7*  ALBUMIN 3.6 3.4*   No results for input(s): LIPASE, AMYLASE in the last 8760 hours. No results for input(s): AMMONIA in the last 8760 hours. CBC:  Recent Labs  11/03/15 2054  11/05/15 0225 11/05/15 0453 11/06/15 0429  WBC 11.4*  < > 8.5 10.1 10.5  NEUTROABS 8.7*  --   --   --   --   HGB 12.4  < > 9.9* 10.1* 11.0*  HCT 36.8  < > 30.1* 30.9* 33.5*  MCV 85.2  < > 87.2 88.0 86.8  PLT 133*  < > 125* 115* 139*  < > = values in this interval not displayed. Cardiac Enzymes:  Recent Labs  11/03/15 2054  TROPONINI 0.17*   BNP: Invalid input(s): POCBNP No results found for: HGBA1C No results found for: TSH No results found for: VITAMINB12 No results found for: FOLATE No results found for: IRON, TIBC, FERRITIN  Imaging and Procedures obtained prior to SNF admission: Dg Chest 2 View  Result Date: 11/03/2015 CLINICAL DATA:  Lower extremity edema ; history of hypertension and hyperlipidemia EXAM: CHEST  2 VIEW COMPARISON:  PA and lateral chest x-ray of June 02, 2004. FINDINGS: The lungs remain hyperinflated. There is no focal infiltrate. There is no pleural effusion. The heart and pulmonary vascularity are normal. The mediastinum is normal in width. There is calcification in the wall of the aortic arch. There is a small hiatal hernia. There is multilevel degenerative disc disease and mild dextrocurvature of the thoracic spine. IMPRESSION: 1. COPD-reactive airway disease. There is no evidence of pneumonia nor CHF. 2. Aortic atherosclerosis. 3. Small hiatal hernia. Electronically Signed   By: David  Martinique M.D.   On: 11/03/2015 12:58   Nm Pulmonary Perf And Vent  Result Date: 11/04/2015 CLINICAL DATA:  82 year old female with history of DVT. No chest pain or shortness of breath EXAM: NUCLEAR MEDICINE VENTILATION - PERFUSION LUNG SCAN TECHNIQUE: Ventilation images were obtained in multiple  projections using inhaled aerosol Tc-44m DTPA. Perfusion images were obtained in multiple projections after intravenous injection of Tc-22m MAA. RADIOPHARMACEUTICALS:  31.8 mCi Technetium-2m DTPA aerosol inhalation and 4.1 mCi Technetium-100m MAA IV COMPARISON:  Chest radiograph dated 11/03/2015 FINDINGS: Ventilation: No focal ventilation defect. Perfusion: No wedge shaped peripheral perfusion defects to suggest acute pulmonary embolism. IMPRESSION: Low probability for pulmonary embolism. Electronically Signed   By: Anner Crete M.D.   On: 11/04/2015 01:30   US Venous Img Lower Unilateral Left  Result Date: 11/03/2015 CLINICAL DATA:  Left lower extremity bruising and swelling for three days EXAM: LEFT LOWER EXTREMITY VENOUS DOPPLER ULTRASOUND TECHNIQUE: Gray-scale sonography with graded compression, as well as color Doppler and duplex ultrasound were performed to evaluate the lower extremity deep venous systems from the level of the common femoral vein and including the common femoral, femoral, profunda femoral, popliteal and calf veins including the posterior tibial, peroneal and gastrocnemius veins when visible. The superficial great saphenous vein was also interrogated. Spectral Doppler was utilized to evaluate flow at rest and with distal augmentation maneuvers in the common femoral, femoral and popliteal veins. COMPARISON:  None. FINDINGS: Contralateral Common Femoral Vein: Respiratory phasicity is normal and symmetric with the symptomatic side. No evidence of thrombus. Normal compressibility. Common Femoral Vein: Partially occlusive thrombus with decreased compressibility. Saphenofemoral Junction: Partially occlusive thrombus with decreased compressability. Profunda Femoral Vein: The vein is not compressible and is occluded by thrombus. Femoral Vein: The vein is not compressible and is occluded by thrombus. Popliteal Vein: Partially occlusive thrombus with decreased compressability. Calf Veins:  Not  evaluated well due to significant calf edema. Other Findings:  Significant soft tissue edema. IMPRESSION: Thrombus through the left lower extremity deep venous system from common femoral vein through popliteal vein including profunda femoral vein. Electronically Signed   By: Elodia Florence.D.  On: 11/03/2015 16:41    Assessment/Plan 1. DVT (deep venous thrombosis), left Patient remains on anticoagulation with Eliquis. Residual edema of both legs is apparent.  2. Ecchymosis Possibly induced by prednisone along with the Eliquis  3. Multiple skin tears Present at the right hip, right medial buttock, and behind the right knee. We will dress this daily and use Xeroderm to protect the wounds  4. Unstable gait Continue to use walker. PT and OT evaluations and treatment as needed.  5. Bursitis of right hip Continue prednisone for now. Because of her skin problems, I think we should attempt to wean this soon.  6. Hyperglycemia Repeat CMP and A1c  7. Urinary incontinence, unspecified incontinence type Urinalysis and culture  8. CKD (chronic kidney disease), stage III -CMP  9. AVM (arteriovenous malformation) of colon with hemorrhage Recent bleeding resolved with argon laser  10. Acute blood loss anemia -CBC  11. Essential hypertension Controlled. Continue current medication.  12. Stasis edema of both lower extremities Compression stockings. Diuretics.    Labs/tests ordered: CBC, CMP, A1c, urine analysis and culture, lipids

## 2015-12-15 DIAGNOSIS — M25551 Pain in right hip: Secondary | ICD-10-CM | POA: Diagnosis not present

## 2015-12-15 DIAGNOSIS — E785 Hyperlipidemia, unspecified: Secondary | ICD-10-CM | POA: Diagnosis not present

## 2015-12-15 DIAGNOSIS — R262 Difficulty in walking, not elsewhere classified: Secondary | ICD-10-CM | POA: Diagnosis not present

## 2015-12-15 DIAGNOSIS — E782 Mixed hyperlipidemia: Secondary | ICD-10-CM | POA: Diagnosis not present

## 2015-12-15 DIAGNOSIS — I10 Essential (primary) hypertension: Secondary | ICD-10-CM | POA: Diagnosis not present

## 2015-12-15 DIAGNOSIS — E119 Type 2 diabetes mellitus without complications: Secondary | ICD-10-CM | POA: Diagnosis not present

## 2015-12-15 DIAGNOSIS — M6281 Muscle weakness (generalized): Secondary | ICD-10-CM | POA: Diagnosis not present

## 2015-12-15 DIAGNOSIS — D649 Anemia, unspecified: Secondary | ICD-10-CM | POA: Diagnosis not present

## 2015-12-15 DIAGNOSIS — M7071 Other bursitis of hip, right hip: Secondary | ICD-10-CM | POA: Diagnosis not present

## 2015-12-16 ENCOUNTER — Encounter: Payer: Self-pay | Admitting: Internal Medicine

## 2015-12-16 DIAGNOSIS — R32 Unspecified urinary incontinence: Secondary | ICD-10-CM

## 2015-12-16 DIAGNOSIS — E1122 Type 2 diabetes mellitus with diabetic chronic kidney disease: Secondary | ICD-10-CM | POA: Insufficient documentation

## 2015-12-16 DIAGNOSIS — T148XXA Other injury of unspecified body region, initial encounter: Secondary | ICD-10-CM

## 2015-12-16 DIAGNOSIS — R58 Hemorrhage, not elsewhere classified: Secondary | ICD-10-CM | POA: Insufficient documentation

## 2015-12-16 DIAGNOSIS — R739 Hyperglycemia, unspecified: Secondary | ICD-10-CM

## 2015-12-16 DIAGNOSIS — E1129 Type 2 diabetes mellitus with other diabetic kidney complication: Secondary | ICD-10-CM

## 2015-12-16 DIAGNOSIS — R2681 Unsteadiness on feet: Secondary | ICD-10-CM

## 2015-12-16 HISTORY — DX: Unspecified urinary incontinence: R32

## 2015-12-16 HISTORY — DX: Hyperglycemia, unspecified: R73.9

## 2015-12-16 HISTORY — DX: Hemorrhage, not elsewhere classified: R58

## 2015-12-16 HISTORY — DX: Unsteadiness on feet: R26.81

## 2015-12-16 HISTORY — DX: Other injury of unspecified body region, initial encounter: T14.8XXA

## 2015-12-18 DIAGNOSIS — M6281 Muscle weakness (generalized): Secondary | ICD-10-CM | POA: Diagnosis not present

## 2015-12-18 DIAGNOSIS — M25551 Pain in right hip: Secondary | ICD-10-CM | POA: Diagnosis not present

## 2015-12-18 DIAGNOSIS — M7071 Other bursitis of hip, right hip: Secondary | ICD-10-CM | POA: Diagnosis not present

## 2015-12-18 DIAGNOSIS — I10 Essential (primary) hypertension: Secondary | ICD-10-CM | POA: Diagnosis not present

## 2015-12-18 DIAGNOSIS — E782 Mixed hyperlipidemia: Secondary | ICD-10-CM | POA: Diagnosis not present

## 2015-12-18 DIAGNOSIS — R262 Difficulty in walking, not elsewhere classified: Secondary | ICD-10-CM | POA: Diagnosis not present

## 2015-12-19 ENCOUNTER — Non-Acute Institutional Stay: Payer: Medicare Other | Admitting: Nurse Practitioner

## 2015-12-19 ENCOUNTER — Encounter: Payer: Self-pay | Admitting: Nurse Practitioner

## 2015-12-19 DIAGNOSIS — R609 Edema, unspecified: Secondary | ICD-10-CM

## 2015-12-19 DIAGNOSIS — E782 Mixed hyperlipidemia: Secondary | ICD-10-CM | POA: Diagnosis not present

## 2015-12-19 DIAGNOSIS — L8990 Pressure ulcer of unspecified site, unspecified stage: Secondary | ICD-10-CM | POA: Diagnosis not present

## 2015-12-19 DIAGNOSIS — M7071 Other bursitis of hip, right hip: Secondary | ICD-10-CM | POA: Diagnosis not present

## 2015-12-19 DIAGNOSIS — N183 Chronic kidney disease, stage 3 unspecified: Secondary | ICD-10-CM

## 2015-12-19 DIAGNOSIS — M25551 Pain in right hip: Secondary | ICD-10-CM | POA: Diagnosis not present

## 2015-12-19 DIAGNOSIS — I10 Essential (primary) hypertension: Secondary | ICD-10-CM | POA: Diagnosis not present

## 2015-12-19 DIAGNOSIS — M6281 Muscle weakness (generalized): Secondary | ICD-10-CM | POA: Diagnosis not present

## 2015-12-19 DIAGNOSIS — R262 Difficulty in walking, not elsewhere classified: Secondary | ICD-10-CM | POA: Diagnosis not present

## 2015-12-19 DIAGNOSIS — R739 Hyperglycemia, unspecified: Secondary | ICD-10-CM | POA: Diagnosis not present

## 2015-12-19 DIAGNOSIS — E785 Hyperlipidemia, unspecified: Secondary | ICD-10-CM | POA: Diagnosis not present

## 2015-12-19 NOTE — Assessment & Plan Note (Signed)
Left hip, continue current tx

## 2015-12-19 NOTE — Assessment & Plan Note (Signed)
12/16/15 LDL 83, continue statin

## 2015-12-19 NOTE — Assessment & Plan Note (Signed)
BLE 2+, LLE>RLL, resume Furosemide 20mg , adding Spironolactone 25mg , update BMP, BNP, TSH.  12/16/15 Hgb 12.1, Na 139, K 3.7, Bun 31, creat 1.04, TP 5.0, albumin 3.0, LDL 83, Hgb a1c 9.5

## 2015-12-19 NOTE — Assessment & Plan Note (Signed)
12/16/15 Hgb 12.1, Na 139, K 3.7, Bun 31, creat 1.04, TP 5.0, albumin 3.0, LDL 83, Hgb a1c 9.5 12/19/15 Metformin 500mg  bid.

## 2015-12-19 NOTE — Assessment & Plan Note (Signed)
Controlled, continue Furosemide and Spironolactone.

## 2015-12-19 NOTE — Progress Notes (Signed)
Location:   [facility] Room Number: AL 13 Place of Service:  ALF [phone]) Provider:  Marlana Latus  NP   Patient Care Team: Estill Dooms, MD as PCP - General (Internal Medicine) Unique Searfoss Otho Darner, NP as Nurse Practitioner (Internal Medicine)  Extended Emergency Contact Information Primary Emergency Contact: Mayweather,Leonard Address: [address] [place] [address] Johnnette Litter of [place] Phone: [phone] Work Phone: [phone] Mobile Phone: [phone] Relation: Son Secondary Emergency Contact: [place] of [place] Phone: [phone] Relation: Friend  Code Status:  DNR Goals of care: Advanced Directive information Advanced Directives 12/19/2015  Does patient have an advance directive? Yes  Type of Paramedic of [place];Living will;Out of facility DNR (pink MOST or yellow form)  Does patient want to make changes to advanced directive? No - Patient declined  Copy of advanced directive(s) in chart? Yes  Would patient like information on creating an advanced directive? -     Chief Complaint  Patient presents with  . Acute Visit    Skin tear to(rt) wrist, bilateral legs swollen    HPI:  Pt is a 82 y.o. female seen today for an acute visit for BLE edema, denied chest pain or discomfort, denied cough or phlegm production, denied orthopnea at night, Furosemide 20mg  on hold, Bun/creat 31/1.04 12/16/15 Hgb a1c 9.5, Metformin 500mg  bid started subsequently, Hx of DVT LLE, taking Eliquis 5mg  daily. A small skin tear right wrist, healing nicely, no s/s of infection.    Past Medical History:  Diagnosis Date  . Acute blood loss anemia   . Allergy   . Asthma   . AVM (arteriovenous malformation) of colon with hemorrhage   . Bursitis of right hip   . Cataract   . CKD (chronic kidney disease), stage III 11/04/2015  . COLONIC POLYPS 03/26/2005   Qualifier: Diagnosis of  By: Talbert Cage CMA  ([place]), June    . DIVERTICULOSIS, COLON 03/26/2005   Qualifier: Diagnosis of  By: Talbert Cage CMA ([place]), June    . DVT (deep venous thrombosis), left 11/03/2015  . Ecchymosis 12/16/2015  . Hyperglycemia 12/16/2015  . Hyperlipidemia   . Hypertension   . Multiple skin tears 12/16/2015  . Osteopenia   . Stasis edema of both lower extremities 11/04/2015  . Unstable gait 12/16/2015  . Urine incontinence 12/16/2015   Past Surgical History:  Procedure Laterality Date  . ABDOMINAL HYSTERECTOMY  2006   Dr. Maryelizabeth Rowan  . CATARACT EXTRACTION W/ INTRAOCULAR LENS IMPLANT Bilateral 1999/2003   Dr. Rutherford Guys  . COLONOSCOPY N/A 11/06/2015   Procedure: COLONOSCOPY;  Surgeon: Mauri Pole, MD;  Location: [facility];  Service: Endoscopy;  Laterality: N/A;  MAC if available, otherwise moderate sedation    Allergies  Allergen Reactions  . Epinephrine Nausea Only    Unknown.  Marland Kitchen Hydrocodone-Acetaminophen     REACTION: nausea  . Labetalol   . Procaine       Medication List       Accurate as of 12/19/15  4:58 PM. Always use your most recent med list.          ELIQUIS 5 MG Tabs tablet Generic drug:  apixaban Take 5 mg by mouth. Take one tablet twice daily   furosemide 20 MG tablet Commonly known as:  LASIX Take 1 tablet (20 mg total) by mouth daily.   ICAPS MV Tabs Take 1 tablet by mouth daily.  predniSONE 5 MG tablet Commonly known as:  DELTASONE Take 5 mg by mouth 3 (three) times daily.   simvastatin 10 MG tablet Commonly known as:  ZOCOR Take 10 mg by mouth daily.       Review of Systems  Constitutional: Positive for activity change and fatigue. Negative for appetite change, chills, diaphoresis, fever and unexpected weight change.  HENT: Negative for congestion, ear discharge, ear pain, hearing loss, nosebleeds, postnasal drip, rhinorrhea, sore throat, tinnitus, trouble swallowing and voice change.   Eyes: Negative for pain, discharge, redness, itching and visual disturbance.    Respiratory: Negative for cough, choking, shortness of breath and wheezing.   Cardiovascular: Positive for leg swelling (3+ bilateral lower legs). Negative for chest pain and palpitations.  Gastrointestinal: Positive for constipation. Negative for abdominal distention, abdominal pain, blood in stool, diarrhea, nausea and vomiting.       Diverticulosis coli. AV malformations of the colon. Internal hemorrhoids. History of GI bleed. History of decubitus ulceration in the perineal area. Last colonoscopy 11/06/2015 by Dr. Silverio Decamp.  Endocrine: Negative for cold intolerance, heat intolerance, polydipsia, polyphagia and polyuria.  Genitourinary: Positive for decreased urine volume. Negative for difficulty urinating, dysuria, flank pain, frequency, hematuria, pelvic pain, urgency and vaginal discharge.       History of urinary incontinence. CKD 3.  Musculoskeletal: Positive for arthralgias and gait problem. Negative for back pain, myalgias, neck pain and neck stiffness.       Chronic bursitis right hip.  Skin: Positive for wound (Multiple skin tears). Negative for color change, pallor and rash.       Multiple ecchymoses  Allergic/Immunologic: Negative.  Negative for environmental allergies.  Neurological: Negative for dizziness, tremors, seizures, syncope, weakness, numbness and headaches.       Repetitive  Hematological: Negative for adenopathy. Does not bruise/bleed easily.       Acute blood loss anemia in June 2017.  Psychiatric/Behavioral: Negative for agitation, behavioral problems, confusion, dysphoric mood, hallucinations, sleep disturbance and suicidal ideas. The patient is nervous/anxious. The patient is not hyperactive.     Immunization History  Administered Date(s) Administered  . DTaP 05/13/2005  . Influenza-Unspecified 02/11/2015  . Pneumococcal Conjugate-13 05/13/2014  . Pneumococcal Polysaccharide-23 05/13/2014  . Zoster 05/13/2014   Pertinent  Health Maintenance Due  Topic Date  Due  . PNA vac Low Risk Adult (2 of 2 - PPSV23) 05/14/2015  . INFLUENZA VACCINE  12/12/2015  . DEXA SCAN  Completed   Fall Risk  11/03/2015  Falls in the past year? No   Functional Status Survey:    Vitals:   12/19/15 1532  BP: 125/71  Pulse: 81  Resp: 18  Temp: 97.3 F (36.3 C)  Weight: 150 lb (68 kg)  Height: 5\' 3"  (1.6 m)   Body mass index is 26.57 kg/m. Physical Exam  Constitutional: She is oriented to person, place, and time. She appears well-developed and well-nourished. No distress.  Elderly and fragile.  HENT:  Right Ear: External ear normal.  Left Ear: External ear normal.  Nose: Nose normal.  Mouth/Throat: Oropharynx is clear and moist. No oropharyngeal exudate.  Eyes: Conjunctivae and EOM are normal. Pupils are equal, round, and reactive to light. No scleral icterus.  Neck: No JVD present. No tracheal deviation present. No thyromegaly present.  Cardiovascular: Normal rate, regular rhythm and normal heart sounds.  Exam reveals no gallop and no friction rub.   No murmur heard. Unable to feel DP and PT bilaterally due to edema.  Pulmonary/Chest: Effort normal. No  respiratory distress. She has no wheezes. She has no rales. She exhibits no tenderness.  Abdominal: She exhibits no distension and no mass. There is no tenderness.  Musculoskeletal: Normal range of motion. She exhibits edema (3+ bipedal from the knee to the foot). She exhibits no tenderness.  Tender at the right hip and down the lateral right leg. Using walker with 2 front wheels and rear skids. Very unstable with walking.  Lymphadenopathy:    She has no cervical adenopathy.  Neurological: She is alert and oriented to person, place, and time. No cranial nerve deficit. Coordination abnormal.  Skin: No rash noted. She is not diaphoretic. No erythema. No pallor.  Psychiatric: She has a normal mood and affect. Her behavior is normal. Judgment and thought content normal.    Labs reviewed:  Recent Labs   11/03/15 2054 11/04/15 0452 11/06/15 0429  NA 139 140 142  K 4.1 3.8 4.0  CL 104 108 112*  CO2 25 25 21*  GLUCOSE 113* 87 241*  BUN 55* 54* 47*  CREATININE 1.62* 1.46* 1.32*  CALCIUM 10.0 9.0 8.5*    Recent Labs  11/03/15 1226 11/03/15 2054  AST 20 25  ALT 18 21  ALKPHOS 44 43  BILITOT 0.8 1.2  PROT 6.0* 5.7*  ALBUMIN 3.6 3.4*    Recent Labs  11/03/15 2054  11/05/15 0225 11/05/15 0453 11/06/15 0429  WBC 11.4*  < > 8.5 10.1 10.5  NEUTROABS 8.7*  --   --   --   --   HGB 12.4  < > 9.9* 10.1* 11.0*  HCT 36.8  < > 30.1* 30.9* 33.5*  MCV 85.2  < > 87.2 88.0 86.8  PLT 133*  < > 125* 115* 139*  < > = values in this interval not displayed. No results found for: TSH No results found for: HGBA1C No results found for: CHOL, HDL, LDLCALC, LDLDIRECT, TRIG, CHOLHDL  Significant Diagnostic Results in last 30 days:  No results found.  Assessment/Plan Edema BLE 2+, LLE>RLL, resume Furosemide 20mg , adding Spironolactone 25mg , update BMP, BNP, TSH.  12/16/15 Hgb 12.1, Na 139, K 3.7, Bun 31, creat 1.04, TP 5.0, albumin 3.0, LDL 83, Hgb a1c 9.5  Hyperglycemia 12/16/15 Hgb 12.1, Na 139, K 3.7, Bun 31, creat 1.04, TP 5.0, albumin 3.0, LDL 83, Hgb a1c 9.5 12/19/15 Metformin 500mg  bid.    Hyperlipidemia 12/16/15 LDL 83, continue statin  CKD (chronic kidney disease), stage III BLE 2+, LLE>RLL, resume Furosemide 20mg , adding Spironolactone 25mg , update BMP, BNP, TSH.  12/16/15 Hgb 12.1, Na 139, K 3.7, Bun 31, creat 1.04, TP 5.0, albumin 3.0, LDL 83, Hgb a1c 9.5   Pressure ulcer Left hip, continue current tx  Essential hypertension Controlled, continue Furosemide and Spironolactone.     Family/ staff Communication: continue AL for care assistance.   Labs/tests ordered:  CBC, CMP, lipid panel, Hgb A1c done 12/16/15. TSH, BNP, BMP

## 2015-12-20 DIAGNOSIS — M6281 Muscle weakness (generalized): Secondary | ICD-10-CM | POA: Diagnosis not present

## 2015-12-20 DIAGNOSIS — I10 Essential (primary) hypertension: Secondary | ICD-10-CM | POA: Diagnosis not present

## 2015-12-20 DIAGNOSIS — M25551 Pain in right hip: Secondary | ICD-10-CM | POA: Diagnosis not present

## 2015-12-20 DIAGNOSIS — R262 Difficulty in walking, not elsewhere classified: Secondary | ICD-10-CM | POA: Diagnosis not present

## 2015-12-20 DIAGNOSIS — M7071 Other bursitis of hip, right hip: Secondary | ICD-10-CM | POA: Diagnosis not present

## 2015-12-20 DIAGNOSIS — E782 Mixed hyperlipidemia: Secondary | ICD-10-CM | POA: Diagnosis not present

## 2015-12-21 DIAGNOSIS — I10 Essential (primary) hypertension: Secondary | ICD-10-CM | POA: Diagnosis not present

## 2015-12-21 DIAGNOSIS — I5020 Unspecified systolic (congestive) heart failure: Secondary | ICD-10-CM | POA: Diagnosis not present

## 2015-12-21 LAB — BASIC METABOLIC PANEL
BUN: 40 mg/dL — AB (ref 4–21)
Creatinine: 1.3 mg/dL — AB (ref <=1.1)
GLUCOSE: 147 mg/dL
POTASSIUM: 5.1 mmol/L (ref 3.4–5.3)
Sodium: 143 mmol/L (ref 137–147)

## 2015-12-21 LAB — TSH: TSH: 1.32 u[IU]/mL (ref <=5.90)

## 2015-12-22 DIAGNOSIS — I10 Essential (primary) hypertension: Secondary | ICD-10-CM | POA: Diagnosis not present

## 2015-12-22 DIAGNOSIS — R262 Difficulty in walking, not elsewhere classified: Secondary | ICD-10-CM | POA: Diagnosis not present

## 2015-12-22 DIAGNOSIS — M25551 Pain in right hip: Secondary | ICD-10-CM | POA: Diagnosis not present

## 2015-12-22 DIAGNOSIS — M7071 Other bursitis of hip, right hip: Secondary | ICD-10-CM | POA: Diagnosis not present

## 2015-12-22 DIAGNOSIS — M6281 Muscle weakness (generalized): Secondary | ICD-10-CM | POA: Diagnosis not present

## 2015-12-22 DIAGNOSIS — E782 Mixed hyperlipidemia: Secondary | ICD-10-CM | POA: Diagnosis not present

## 2015-12-25 DIAGNOSIS — M7071 Other bursitis of hip, right hip: Secondary | ICD-10-CM | POA: Diagnosis not present

## 2015-12-25 DIAGNOSIS — M6281 Muscle weakness (generalized): Secondary | ICD-10-CM | POA: Diagnosis not present

## 2015-12-25 DIAGNOSIS — R262 Difficulty in walking, not elsewhere classified: Secondary | ICD-10-CM | POA: Diagnosis not present

## 2015-12-25 DIAGNOSIS — I10 Essential (primary) hypertension: Secondary | ICD-10-CM | POA: Diagnosis not present

## 2015-12-25 DIAGNOSIS — M25551 Pain in right hip: Secondary | ICD-10-CM | POA: Diagnosis not present

## 2015-12-25 DIAGNOSIS — E782 Mixed hyperlipidemia: Secondary | ICD-10-CM | POA: Diagnosis not present

## 2015-12-26 ENCOUNTER — Other Ambulatory Visit: Payer: Self-pay | Admitting: *Deleted

## 2015-12-26 ENCOUNTER — Encounter: Payer: Self-pay | Admitting: Nurse Practitioner

## 2015-12-26 DIAGNOSIS — I5032 Chronic diastolic (congestive) heart failure: Secondary | ICD-10-CM | POA: Insufficient documentation

## 2015-12-27 DIAGNOSIS — E782 Mixed hyperlipidemia: Secondary | ICD-10-CM | POA: Diagnosis not present

## 2015-12-27 DIAGNOSIS — I10 Essential (primary) hypertension: Secondary | ICD-10-CM | POA: Diagnosis not present

## 2015-12-27 DIAGNOSIS — M6281 Muscle weakness (generalized): Secondary | ICD-10-CM | POA: Diagnosis not present

## 2015-12-27 DIAGNOSIS — R262 Difficulty in walking, not elsewhere classified: Secondary | ICD-10-CM | POA: Diagnosis not present

## 2015-12-27 DIAGNOSIS — M25551 Pain in right hip: Secondary | ICD-10-CM | POA: Diagnosis not present

## 2015-12-27 DIAGNOSIS — M7071 Other bursitis of hip, right hip: Secondary | ICD-10-CM | POA: Diagnosis not present

## 2015-12-28 ENCOUNTER — Other Ambulatory Visit: Payer: Self-pay | Admitting: *Deleted

## 2015-12-28 DIAGNOSIS — I5020 Unspecified systolic (congestive) heart failure: Secondary | ICD-10-CM | POA: Diagnosis not present

## 2015-12-28 LAB — BASIC METABOLIC PANEL
BUN: 42 mg/dL — AB (ref 4–21)
CREATININE: 1.2 mg/dL — AB (ref <=1.1)
Glucose: 118 mg/dL
Potassium: 4.5 mmol/L (ref 3.4–5.3)
SODIUM: 140 mmol/L (ref 137–147)

## 2015-12-29 DIAGNOSIS — R262 Difficulty in walking, not elsewhere classified: Secondary | ICD-10-CM | POA: Diagnosis not present

## 2015-12-29 DIAGNOSIS — M25551 Pain in right hip: Secondary | ICD-10-CM | POA: Diagnosis not present

## 2015-12-29 DIAGNOSIS — E782 Mixed hyperlipidemia: Secondary | ICD-10-CM | POA: Diagnosis not present

## 2015-12-29 DIAGNOSIS — M6281 Muscle weakness (generalized): Secondary | ICD-10-CM | POA: Diagnosis not present

## 2015-12-29 DIAGNOSIS — I10 Essential (primary) hypertension: Secondary | ICD-10-CM | POA: Diagnosis not present

## 2015-12-29 DIAGNOSIS — M7071 Other bursitis of hip, right hip: Secondary | ICD-10-CM | POA: Diagnosis not present

## 2016-01-01 DIAGNOSIS — M25551 Pain in right hip: Secondary | ICD-10-CM | POA: Diagnosis not present

## 2016-01-01 DIAGNOSIS — E782 Mixed hyperlipidemia: Secondary | ICD-10-CM | POA: Diagnosis not present

## 2016-01-01 DIAGNOSIS — R262 Difficulty in walking, not elsewhere classified: Secondary | ICD-10-CM | POA: Diagnosis not present

## 2016-01-01 DIAGNOSIS — M6281 Muscle weakness (generalized): Secondary | ICD-10-CM | POA: Diagnosis not present

## 2016-01-01 DIAGNOSIS — M7071 Other bursitis of hip, right hip: Secondary | ICD-10-CM | POA: Diagnosis not present

## 2016-01-01 DIAGNOSIS — I10 Essential (primary) hypertension: Secondary | ICD-10-CM | POA: Diagnosis not present

## 2016-01-02 DIAGNOSIS — M6281 Muscle weakness (generalized): Secondary | ICD-10-CM | POA: Diagnosis not present

## 2016-01-02 DIAGNOSIS — I10 Essential (primary) hypertension: Secondary | ICD-10-CM | POA: Diagnosis not present

## 2016-01-02 DIAGNOSIS — R262 Difficulty in walking, not elsewhere classified: Secondary | ICD-10-CM | POA: Diagnosis not present

## 2016-01-02 DIAGNOSIS — M25551 Pain in right hip: Secondary | ICD-10-CM | POA: Diagnosis not present

## 2016-01-02 DIAGNOSIS — M7071 Other bursitis of hip, right hip: Secondary | ICD-10-CM | POA: Diagnosis not present

## 2016-01-02 DIAGNOSIS — E782 Mixed hyperlipidemia: Secondary | ICD-10-CM | POA: Diagnosis not present

## 2016-01-03 DIAGNOSIS — M25551 Pain in right hip: Secondary | ICD-10-CM | POA: Diagnosis not present

## 2016-01-03 DIAGNOSIS — E782 Mixed hyperlipidemia: Secondary | ICD-10-CM | POA: Diagnosis not present

## 2016-01-03 DIAGNOSIS — R262 Difficulty in walking, not elsewhere classified: Secondary | ICD-10-CM | POA: Diagnosis not present

## 2016-01-03 DIAGNOSIS — M6281 Muscle weakness (generalized): Secondary | ICD-10-CM | POA: Diagnosis not present

## 2016-01-03 DIAGNOSIS — I10 Essential (primary) hypertension: Secondary | ICD-10-CM | POA: Diagnosis not present

## 2016-01-03 DIAGNOSIS — M7071 Other bursitis of hip, right hip: Secondary | ICD-10-CM | POA: Diagnosis not present

## 2016-01-05 DIAGNOSIS — R262 Difficulty in walking, not elsewhere classified: Secondary | ICD-10-CM | POA: Diagnosis not present

## 2016-01-05 DIAGNOSIS — M6281 Muscle weakness (generalized): Secondary | ICD-10-CM | POA: Diagnosis not present

## 2016-01-05 DIAGNOSIS — M7071 Other bursitis of hip, right hip: Secondary | ICD-10-CM | POA: Diagnosis not present

## 2016-01-05 DIAGNOSIS — I10 Essential (primary) hypertension: Secondary | ICD-10-CM | POA: Diagnosis not present

## 2016-01-05 DIAGNOSIS — M25551 Pain in right hip: Secondary | ICD-10-CM | POA: Diagnosis not present

## 2016-01-05 DIAGNOSIS — E782 Mixed hyperlipidemia: Secondary | ICD-10-CM | POA: Diagnosis not present

## 2016-01-08 DIAGNOSIS — M7071 Other bursitis of hip, right hip: Secondary | ICD-10-CM | POA: Diagnosis not present

## 2016-01-08 DIAGNOSIS — E782 Mixed hyperlipidemia: Secondary | ICD-10-CM | POA: Diagnosis not present

## 2016-01-08 DIAGNOSIS — I10 Essential (primary) hypertension: Secondary | ICD-10-CM | POA: Diagnosis not present

## 2016-01-08 DIAGNOSIS — M25551 Pain in right hip: Secondary | ICD-10-CM | POA: Diagnosis not present

## 2016-01-08 DIAGNOSIS — R262 Difficulty in walking, not elsewhere classified: Secondary | ICD-10-CM | POA: Diagnosis not present

## 2016-01-08 DIAGNOSIS — M6281 Muscle weakness (generalized): Secondary | ICD-10-CM | POA: Diagnosis not present

## 2016-01-09 DIAGNOSIS — M25551 Pain in right hip: Secondary | ICD-10-CM | POA: Diagnosis not present

## 2016-01-09 DIAGNOSIS — I10 Essential (primary) hypertension: Secondary | ICD-10-CM | POA: Diagnosis not present

## 2016-01-09 DIAGNOSIS — M6281 Muscle weakness (generalized): Secondary | ICD-10-CM | POA: Diagnosis not present

## 2016-01-09 DIAGNOSIS — E782 Mixed hyperlipidemia: Secondary | ICD-10-CM | POA: Diagnosis not present

## 2016-01-09 DIAGNOSIS — R262 Difficulty in walking, not elsewhere classified: Secondary | ICD-10-CM | POA: Diagnosis not present

## 2016-01-09 DIAGNOSIS — M7071 Other bursitis of hip, right hip: Secondary | ICD-10-CM | POA: Diagnosis not present

## 2016-01-10 DIAGNOSIS — M7071 Other bursitis of hip, right hip: Secondary | ICD-10-CM | POA: Diagnosis not present

## 2016-01-10 DIAGNOSIS — R262 Difficulty in walking, not elsewhere classified: Secondary | ICD-10-CM | POA: Diagnosis not present

## 2016-01-10 DIAGNOSIS — E782 Mixed hyperlipidemia: Secondary | ICD-10-CM | POA: Diagnosis not present

## 2016-01-10 DIAGNOSIS — M6281 Muscle weakness (generalized): Secondary | ICD-10-CM | POA: Diagnosis not present

## 2016-01-10 DIAGNOSIS — M25551 Pain in right hip: Secondary | ICD-10-CM | POA: Diagnosis not present

## 2016-01-10 DIAGNOSIS — I10 Essential (primary) hypertension: Secondary | ICD-10-CM | POA: Diagnosis not present

## 2016-01-11 DIAGNOSIS — I10 Essential (primary) hypertension: Secondary | ICD-10-CM | POA: Diagnosis not present

## 2016-01-11 DIAGNOSIS — M6281 Muscle weakness (generalized): Secondary | ICD-10-CM | POA: Diagnosis not present

## 2016-01-11 DIAGNOSIS — M25551 Pain in right hip: Secondary | ICD-10-CM | POA: Diagnosis not present

## 2016-01-11 DIAGNOSIS — E782 Mixed hyperlipidemia: Secondary | ICD-10-CM | POA: Diagnosis not present

## 2016-01-11 DIAGNOSIS — M7071 Other bursitis of hip, right hip: Secondary | ICD-10-CM | POA: Diagnosis not present

## 2016-01-11 DIAGNOSIS — R262 Difficulty in walking, not elsewhere classified: Secondary | ICD-10-CM | POA: Diagnosis not present

## 2016-01-15 DIAGNOSIS — M25551 Pain in right hip: Secondary | ICD-10-CM | POA: Diagnosis not present

## 2016-01-15 DIAGNOSIS — M6281 Muscle weakness (generalized): Secondary | ICD-10-CM | POA: Diagnosis not present

## 2016-01-15 DIAGNOSIS — R262 Difficulty in walking, not elsewhere classified: Secondary | ICD-10-CM | POA: Diagnosis not present

## 2016-01-15 DIAGNOSIS — E782 Mixed hyperlipidemia: Secondary | ICD-10-CM | POA: Diagnosis not present

## 2016-01-15 DIAGNOSIS — M7071 Other bursitis of hip, right hip: Secondary | ICD-10-CM | POA: Diagnosis not present

## 2016-01-15 DIAGNOSIS — I10 Essential (primary) hypertension: Secondary | ICD-10-CM | POA: Diagnosis not present

## 2016-01-16 DIAGNOSIS — I10 Essential (primary) hypertension: Secondary | ICD-10-CM | POA: Diagnosis not present

## 2016-01-16 DIAGNOSIS — M25551 Pain in right hip: Secondary | ICD-10-CM | POA: Diagnosis not present

## 2016-01-16 DIAGNOSIS — M6281 Muscle weakness (generalized): Secondary | ICD-10-CM | POA: Diagnosis not present

## 2016-01-16 DIAGNOSIS — E782 Mixed hyperlipidemia: Secondary | ICD-10-CM | POA: Diagnosis not present

## 2016-01-16 DIAGNOSIS — M7071 Other bursitis of hip, right hip: Secondary | ICD-10-CM | POA: Diagnosis not present

## 2016-01-16 DIAGNOSIS — R262 Difficulty in walking, not elsewhere classified: Secondary | ICD-10-CM | POA: Diagnosis not present

## 2016-01-18 ENCOUNTER — Encounter: Payer: Self-pay | Admitting: Nurse Practitioner

## 2016-01-18 ENCOUNTER — Non-Acute Institutional Stay: Payer: Medicare Other | Admitting: Nurse Practitioner

## 2016-01-18 DIAGNOSIS — N183 Chronic kidney disease, stage 3 unspecified: Secondary | ICD-10-CM

## 2016-01-18 DIAGNOSIS — I5032 Chronic diastolic (congestive) heart failure: Secondary | ICD-10-CM

## 2016-01-18 DIAGNOSIS — L8990 Pressure ulcer of unspecified site, unspecified stage: Secondary | ICD-10-CM | POA: Diagnosis not present

## 2016-01-18 DIAGNOSIS — R609 Edema, unspecified: Secondary | ICD-10-CM

## 2016-01-18 DIAGNOSIS — I10 Essential (primary) hypertension: Secondary | ICD-10-CM

## 2016-01-18 DIAGNOSIS — I82402 Acute embolism and thrombosis of unspecified deep veins of left lower extremity: Secondary | ICD-10-CM | POA: Diagnosis not present

## 2016-01-18 NOTE — Assessment & Plan Note (Signed)
Hx, continue Eliquis.  

## 2016-01-18 NOTE — Assessment & Plan Note (Signed)
Compensated clinically, continue Furosemide 20mg , Spironolactone 25mg 

## 2016-01-18 NOTE — Assessment & Plan Note (Signed)
Pressure ulcer @ right hip, non healing, no s/s of infection, will apply Santyl to debride yellow slough in the center of the wound, continu epressure reduction

## 2016-01-18 NOTE — Assessment & Plan Note (Signed)
Improved, 1+ BLE, continue Furosemide and Spironolactone.  

## 2016-01-18 NOTE — Assessment & Plan Note (Signed)
BLE 1+, LLE>RLL, continue Furosemide 20mg , Spironolactone 25mg  12/16/15 Hgb 12.1 12/21/15 BNP 1234.4 12/28/15 Na 140, K 4.5, Bun 42, creat 1.2

## 2016-01-18 NOTE — Assessment & Plan Note (Signed)
Controlled, continue Furosemide and Spironolactone.

## 2016-01-18 NOTE — Progress Notes (Signed)
Location:   [facility] Room Number: 13 Place of Service:  ALF [phone]) Provider: Haniah Penny, [place]  NP  Jeanmarie Hubert, MD  Patient Care Team: Estill Dooms, MD as PCP - General (Internal Medicine) Rickelle Sylvestre Otho Darner, NP as Nurse Practitioner (Internal Medicine)  Extended Emergency Contact Information Primary Emergency Contact: Brueckner,Leonard Address: [address] [place] [address] Johnnette Litter of [place] Phone: [phone] Work Phone: [phone] Mobile Phone: [phone] Relation: Son Secondary Emergency Contact: [place] of [place] Phone: [phone] Relation: Friend  Code Status:  DNR Goals of care: Advanced Directive information Advanced Directives 01/18/2016  Does patient have an advance directive? Yes  Type of Paramedic of [place];Living will;Out of facility DNR (pink MOST or yellow form)  Does patient want to make changes to advanced directive? No - Patient declined  Copy of advanced directive(s) in chart? Yes  Would patient like information on creating an advanced directive? -     Chief Complaint  Patient presents with  . Medical Management of Chronic Issues    HPI:  Pt is a 82 y.o. female seen today for medical management of chronic diseases.     Hx of BLE edema, improved on Furosemide 20g, Spironolactone 25mg  daily.  12/16/15 Hgb a1c 9.5, Metformin 500mg  bid started subsequently, Hx of DVT LLE, taking Eliquis 5mg  daily. Pressure ulcer @ right hip, non healing, no s/s of infection.  Past Medical History:  Diagnosis Date  . Acute blood loss anemia   . Allergy   . Asthma   . AVM (arteriovenous malformation) of colon with hemorrhage   . Bursitis of right hip   . Cataract   . CKD (chronic kidney disease), stage III 11/04/2015  . COLONIC POLYPS 03/26/2005   Qualifier: Diagnosis of  By: Talbert Cage CMA ([facility] [facility]), June    . DIVERTICULOSIS, COLON 03/26/2005   Qualifier: Diagnosis  of  By: Talbert Cage CMA ([facility]), June    . DVT (deep venous thrombosis), left 11/03/2015  . Ecchymosis 12/16/2015  . Hyperglycemia 12/16/2015  . Hyperlipidemia   . Hypertension   . Multiple skin tears 12/16/2015  . Osteopenia   . Stasis edema of both lower extremities 11/04/2015  . Unstable gait 12/16/2015  . Urine incontinence 12/16/2015   Past Surgical History:  Procedure Laterality Date  . ABDOMINAL HYSTERECTOMY  2006   Dr. Maryelizabeth Rowan  . CATARACT EXTRACTION W/ INTRAOCULAR LENS IMPLANT Bilateral 1999/2003   Dr. Rutherford Guys  . COLONOSCOPY N/A 11/06/2015   Procedure: COLONOSCOPY;  Surgeon: Mauri Pole, MD;  Location: [facility];  Service: Endoscopy;  Laterality: N/A;  MAC if available, otherwise moderate sedation    Allergies  Allergen Reactions  . Epinephrine Nausea Only    Unknown.  Marland Kitchen Hydrocodone-Acetaminophen     REACTION: nausea  . Labetalol   . Procaine       Medication List       Accurate as of 01/18/16 12:40 PM. Always use your most recent med list.          ELIQUIS 5 MG Tabs tablet Generic drug:  apixaban Take 5 mg by mouth. Take one tablet twice daily   furosemide 20 MG tablet Commonly known as:  LASIX Take 1 tablet (20 mg total) by mouth daily.   ICAPS MV Tabs Take 1 tablet by mouth daily.   metFORMIN 500 MG tablet Commonly known as:  GLUCOPHAGE Take 500 mg by mouth  2 (two) times daily with a meal.   predniSONE 5 MG tablet Commonly known as:  DELTASONE Take 5 mg by mouth 3 (three) times daily.   simvastatin 10 MG tablet Commonly known as:  ZOCOR Take 10 mg by mouth daily.   spironolactone 25 MG tablet Commonly known as:  ALDACTONE Take 25 mg by mouth daily.       Review of Systems  Constitutional: Positive for fatigue. Negative for activity change, appetite change, chills, diaphoresis, fever and unexpected weight change.  HENT: Negative for congestion, ear discharge, ear pain, hearing loss, nosebleeds, postnasal drip, rhinorrhea, sore throat,  tinnitus, trouble swallowing and voice change.   Eyes: Negative for pain, discharge, redness, itching and visual disturbance.  Respiratory: Negative for cough, choking, shortness of breath and wheezing.   Cardiovascular: Positive for leg swelling (3+ bilateral lower legs). Negative for chest pain and palpitations.  Gastrointestinal: Positive for constipation. Negative for abdominal distention, abdominal pain, blood in stool, diarrhea, nausea and vomiting.       Diverticulosis coli. AV malformations of the colon. Internal hemorrhoids. History of GI bleed. History of decubitus ulceration in the perineal area. Last colonoscopy 11/06/2015 by Dr. Silverio Decamp.  Endocrine: Negative for cold intolerance, heat intolerance, polydipsia, polyphagia and polyuria.  Genitourinary: Positive for decreased urine volume. Negative for difficulty urinating, dysuria, flank pain, frequency, hematuria, pelvic pain, urgency and vaginal discharge.       History of urinary incontinence. CKD 3.  Musculoskeletal: Positive for arthralgias and gait problem. Negative for back pain, myalgias, neck pain and neck stiffness.       Chronic bursitis right hip.  Skin: Positive for wound (Multiple skin tears). Negative for color change, pallor and rash.       Multiple ecchymoses. Left hip pressure ulcer  Allergic/Immunologic: Negative.  Negative for environmental allergies.  Neurological: Negative for dizziness, tremors, seizures, syncope, weakness, numbness and headaches.       Repetitive  Hematological: Negative for adenopathy. Does not bruise/bleed easily.       Acute blood loss anemia in June 2017.  Psychiatric/Behavioral: Negative for agitation, behavioral problems, confusion, dysphoric mood, hallucinations, sleep disturbance and suicidal ideas. The patient is nervous/anxious. The patient is not hyperactive.     Immunization History  Administered Date(s) Administered  . DTaP 05/13/2005  . Influenza-Unspecified 02/11/2015  .  Pneumococcal Conjugate-13 05/13/2014  . Pneumococcal Polysaccharide-23 05/13/2014  . Zoster 05/13/2014   Pertinent  Health Maintenance Due  Topic Date Due  . PNA vac Low Risk Adult (2 of 2 - PPSV23) 05/14/2015  . INFLUENZA VACCINE  12/12/2015  . DEXA SCAN  Completed   Fall Risk  11/03/2015  Falls in the past year? No   Functional Status Survey:    Vitals:   01/18/16 1200  Weight: 141 lb (64 kg)   Body mass index is 24.98 kg/m. Physical Exam  Constitutional: She is oriented to person, place, and time. She appears well-developed and well-nourished. No distress.  Elderly and fragile.  HENT:  Right Ear: External ear normal.  Left Ear: External ear normal.  Nose: Nose normal.  Mouth/Throat: Oropharynx is clear and moist. No oropharyngeal exudate.  Eyes: Conjunctivae and EOM are normal. Pupils are equal, round, and reactive to light. No scleral icterus.  Neck: No JVD present. No tracheal deviation present. No thyromegaly present.  Cardiovascular: Normal rate, regular rhythm and normal heart sounds.  Exam reveals no gallop and no friction rub.   No murmur heard. Unable to feel DP and PT bilaterally due  to edema.  Pulmonary/Chest: Effort normal. No respiratory distress. She has no wheezes. She has no rales. She exhibits no tenderness.  Abdominal: She exhibits no distension and no mass. There is no tenderness.  Musculoskeletal: Normal range of motion. She exhibits edema (3+ bipedal from the knee to the foot). She exhibits no tenderness.  Tender at the right hip and down the lateral right leg. Using walker with 2 front wheels and rear skids. Very unstable with walking. 2+ edema BLE  Lymphadenopathy:    She has no cervical adenopathy.  Neurological: She is alert and oriented to person, place, and time. No cranial nerve deficit. Coordination abnormal.  Skin: No rash noted. She is not diaphoretic. No erythema. No pallor.  The right hip pressure ulcer, yellow slough in center, redness  and induration peri wound  Psychiatric: She has a normal mood and affect. Her behavior is normal. Judgment and thought content normal.    Labs reviewed:  Recent Labs  11/03/15 2054 11/04/15 0452 11/06/15 0429 12/21/15 12/28/15  NA 139 140 142 143 140  K 4.1 3.8 4.0 5.1 4.5  CL 104 108 112*  --   --   CO2 25 25 21*  --   --   GLUCOSE 113* 87 241*  --   --   BUN 55* 54* 47* 40* 42*  CREATININE 1.62* 1.46* 1.32* 1.3* 1.2*  CALCIUM 10.0 9.0 8.5*  --   --     Recent Labs  11/03/15 1226 11/03/15 2054  AST 20 25  ALT 18 21  ALKPHOS 44 43  BILITOT 0.8 1.2  PROT 6.0* 5.7*  ALBUMIN 3.6 3.4*    Recent Labs  11/03/15 2054  11/05/15 0225 11/05/15 0453 11/06/15 0429  WBC 11.4*  < > 8.5 10.1 10.5  NEUTROABS 8.7*  --   --   --   --   HGB 12.4  < > 9.9* 10.1* 11.0*  HCT 36.8  < > 30.1* 30.9* 33.5*  MCV 85.2  < > 87.2 88.0 86.8  PLT 133*  < > 125* 115* 139*  < > = values in this interval not displayed. Lab Results  Component Value Date   TSH 1.32 12/21/2015   No results found for: HGBA1C No results found for: CHOL, HDL, LDLCALC, LDLDIRECT, TRIG, CHOLHDL  Significant Diagnostic Results in last 30 days:  No results found.  Assessment/Plan There are no diagnoses linked to this encounter. Essential hypertension Controlled, continue Furosemide and Spironolactone.    DVT (deep venous thrombosis), left Hx, continue Eliquis.   Chronic diastolic congestive heart failure (HCC) Compensated clinically, continue Furosemide 20mg , Spironolactone 25mg    Pressure ulcer Pressure ulcer @ right hip, non healing, no s/s of infection, will apply Santyl to debride yellow slough in the center of the wound, continu epressure reduction    CKD (chronic kidney disease), stage III BLE 1+, LLE>RLL, continue Furosemide 20mg , Spironolactone 25mg  12/16/15 Hgb 12.1 12/21/15 BNP 1234.4 12/28/15 Na 140, K 4.5, Bun 42, creat 1.2    Edema Improved, 1+ BLE, continue Furosemide and Spironolactone.      Family/ staff Communication: continue AL for care assistance,   Labs/tests ordered:  none

## 2016-01-19 DIAGNOSIS — M25551 Pain in right hip: Secondary | ICD-10-CM | POA: Diagnosis not present

## 2016-01-19 DIAGNOSIS — E782 Mixed hyperlipidemia: Secondary | ICD-10-CM | POA: Diagnosis not present

## 2016-01-19 DIAGNOSIS — I10 Essential (primary) hypertension: Secondary | ICD-10-CM | POA: Diagnosis not present

## 2016-01-19 DIAGNOSIS — M6281 Muscle weakness (generalized): Secondary | ICD-10-CM | POA: Diagnosis not present

## 2016-01-19 DIAGNOSIS — M7071 Other bursitis of hip, right hip: Secondary | ICD-10-CM | POA: Diagnosis not present

## 2016-01-19 DIAGNOSIS — R262 Difficulty in walking, not elsewhere classified: Secondary | ICD-10-CM | POA: Diagnosis not present

## 2016-01-22 DIAGNOSIS — I10 Essential (primary) hypertension: Secondary | ICD-10-CM | POA: Diagnosis not present

## 2016-01-22 DIAGNOSIS — M6281 Muscle weakness (generalized): Secondary | ICD-10-CM | POA: Diagnosis not present

## 2016-01-22 DIAGNOSIS — R262 Difficulty in walking, not elsewhere classified: Secondary | ICD-10-CM | POA: Diagnosis not present

## 2016-01-22 DIAGNOSIS — E782 Mixed hyperlipidemia: Secondary | ICD-10-CM | POA: Diagnosis not present

## 2016-01-22 DIAGNOSIS — M25551 Pain in right hip: Secondary | ICD-10-CM | POA: Diagnosis not present

## 2016-01-22 DIAGNOSIS — M7071 Other bursitis of hip, right hip: Secondary | ICD-10-CM | POA: Diagnosis not present

## 2016-01-23 ENCOUNTER — Non-Acute Institutional Stay: Payer: Medicare Other | Admitting: Internal Medicine

## 2016-01-23 ENCOUNTER — Encounter: Payer: Self-pay | Admitting: Internal Medicine

## 2016-01-23 VITALS — BP 110/66 | HR 86 | Temp 97.6°F | Ht 63.0 in | Wt 141.0 lb

## 2016-01-23 DIAGNOSIS — I82402 Acute embolism and thrombosis of unspecified deep veins of left lower extremity: Secondary | ICD-10-CM

## 2016-01-23 DIAGNOSIS — N183 Chronic kidney disease, stage 3 unspecified: Secondary | ICD-10-CM

## 2016-01-23 DIAGNOSIS — E1165 Type 2 diabetes mellitus with hyperglycemia: Secondary | ICD-10-CM

## 2016-01-23 DIAGNOSIS — R609 Edema, unspecified: Secondary | ICD-10-CM

## 2016-01-23 DIAGNOSIS — I10 Essential (primary) hypertension: Secondary | ICD-10-CM | POA: Diagnosis not present

## 2016-01-23 DIAGNOSIS — D62 Acute posthemorrhagic anemia: Secondary | ICD-10-CM

## 2016-01-23 DIAGNOSIS — IMO0002 Reserved for concepts with insufficient information to code with codable children: Secondary | ICD-10-CM

## 2016-01-23 DIAGNOSIS — M7071 Other bursitis of hip, right hip: Secondary | ICD-10-CM | POA: Diagnosis not present

## 2016-01-23 DIAGNOSIS — E1122 Type 2 diabetes mellitus with diabetic chronic kidney disease: Secondary | ICD-10-CM

## 2016-01-23 DIAGNOSIS — N182 Chronic kidney disease, stage 2 (mild): Secondary | ICD-10-CM

## 2016-01-23 NOTE — Progress Notes (Signed)
Facility   [facility] Room Number: AL23  Place of Service: Clinic (12)     Allergies  Allergen Reactions  . Epinephrine Nausea Only    Unknown.  Marland Kitchen Hydrocodone-Acetaminophen     REACTION: nausea  . Labetalol   . Procaine     Chief Complaint  Patient presents with  . Medical Management of Chronic Issues    medication management hyperglycemia, edema, ecchymosis, CKD, anemia. Patient is on Metformin, doesn't understand why. She had diabetics once but she got over it. Wants to know what's going on.    HPI:  Hx DVT in June 2017. Legs are feeling better and she is feeling stronger.  Right greater trochanter ulcer is healing. Eschar is soft. Some oozing. Now on Santyl.  Using metformin for DM. Following carb controlled diet. Elevated blood sugars have likely been aggravated by the use of her prednisone 3 times daily.  Right hip pain is doing better and this provides [place] an opportunity to reduce her prednisone.  Medications: Patient's Medications  New Prescriptions   No medications on file  Previous Medications   AMINO ACIDS-PROTEIN HYDROLYS (FEEDING SUPPLEMENT, PRO-STAT SUGAR FREE 64,) LIQD    Take 30 mLs by mouth. Take 30 ml once daily   APIXABAN (ELIQUIS) 5 MG TABS TABLET    Take 5 mg by mouth. Take one tablet twice daily   FUROSEMIDE (LASIX) 20 MG TABLET    Take 1 tablet (20 mg total) by mouth daily.   METFORMIN (GLUCOPHAGE) 500 MG TABLET    Take 500 mg by mouth 2 (two) times daily with a meal.   MULTIPLE VITAMINS-MINERALS (ICAPS MV) TABS    Take 1 tablet by mouth daily.   PREDNISONE (DELTASONE) 5 MG TABLET    Take 5 mg by mouth 3 (three) times daily.   SIMVASTATIN (ZOCOR) 10 MG TABLET    Take 10 mg by mouth daily.   SPIRONOLACTONE (ALDACTONE) 25 MG TABLET    Take 25 mg by mouth daily.  Modified Medications   No medications on file  Discontinued Medications   No medications on file     Review of Systems  Constitutional: Positive for activity change and fatigue.  Negative for appetite change, chills, diaphoresis, fever and unexpected weight change.  HENT: Negative for congestion, ear discharge, ear pain, hearing loss, nosebleeds, postnasal drip, rhinorrhea, sore throat, tinnitus, trouble swallowing and voice change.   Eyes: Negative for pain, discharge, redness, itching and visual disturbance.  Respiratory: Negative for cough, choking, shortness of breath and wheezing.   Cardiovascular: Positive for leg swelling (3+ bilateral lower legs). Negative for chest pain and palpitations.  Gastrointestinal: Positive for constipation. Negative for abdominal distention, abdominal pain, blood in stool, diarrhea, nausea and vomiting.       Diverticulosis coli. AV malformations of the colon. Internal hemorrhoids. History of GI bleed. History of decubitus ulceration in the perineal area. Last colonoscopy 11/06/2015 by Dr. Silverio Decamp.  Endocrine: Negative for cold intolerance, heat intolerance, polydipsia, polyphagia and polyuria.  Genitourinary: Positive for decreased urine volume. Negative for difficulty urinating, dysuria, flank pain, frequency, hematuria, pelvic pain, urgency and vaginal discharge.       History of urinary incontinence. CKD 3.  Musculoskeletal: Positive for arthralgias and gait problem. Negative for back pain, myalgias, neck pain and neck stiffness.       Chronic bursitis right hip.  Skin: Positive for wound (Multiple skin tears). Negative for color change, pallor and rash.       Multiple ecchymoses  Allergic/Immunologic: Negative.  Negative for environmental allergies.  Neurological: Negative for dizziness, tremors, seizures, syncope, weakness, numbness and headaches.       Repetitive  Hematological: Negative for adenopathy. Does not bruise/bleed easily.       Acute blood loss anemia in June 2017.  Psychiatric/Behavioral: Negative for agitation, behavioral problems, confusion, dysphoric mood, hallucinations, sleep disturbance and suicidal ideas. The  patient is nervous/anxious. The patient is not hyperactive.     Vitals:   01/23/16 1022  BP: 110/66  Pulse: 86  Temp: 97.6 F (36.4 C)  TempSrc: Oral  SpO2: 97%  Weight: 141 lb (64 kg)  Height: '5\' 3"'  (1.6 m)   Wt Readings from Last 3 Encounters:  01/23/16 141 lb (64 kg)  01/18/16 141 lb (64 kg)  12/19/15 150 lb (68 kg)    Body mass index is 24.98 kg/m.  Physical Exam  Constitutional: She is oriented to person, place, and time. She appears well-developed and well-nourished. No distress.  Elderly and fragile.  HENT:  Right Ear: External ear normal.  Left Ear: External ear normal.  Nose: Nose normal.  Mouth/Throat: Oropharynx is clear and moist. No oropharyngeal exudate.  Eyes: Conjunctivae and EOM are normal. Pupils are equal, round, and reactive to light. No scleral icterus.  Neck: No JVD present. No tracheal deviation present. No thyromegaly present.  Cardiovascular: Normal rate, regular rhythm and normal heart sounds.  Exam reveals no gallop and no friction rub.   No murmur heard. Unable to feel DP and PT bilaterally due to edema.  Pulmonary/Chest: Effort normal. No respiratory distress. She has no wheezes. She has no rales. She exhibits no tenderness.  Abdominal: She exhibits no distension and no mass. There is no tenderness.  Musculoskeletal: Normal range of motion. She exhibits edema (3+ bipedal from the knee to the foot). She exhibits no tenderness.  Tender at the right hip and down the lateral right leg. Using walker with 2 front wheels and rear skids. Very unstable with walking.  Lymphadenopathy:    She has no cervical adenopathy.  Neurological: She is alert and oriented to person, place, and time. No cranial nerve deficit. Coordination abnormal.  Skin: No rash noted. She is not diaphoretic. No erythema. No pallor.  Ulceration of the right greater trochanter. Approximately 1.5 cm in diameter. Soft eschar. Some oozing.  Psychiatric: She has a normal mood and  affect. Her behavior is normal. Judgment and thought content normal.     Labs reviewed: Lab Summary Latest Ref Rng & Units 12/28/2015 12/21/2015 11/06/2015 11/05/2015 11/05/2015 11/04/2015  Hemoglobin 12.0 - 15.0 g/dL (None) (None) 11.0(L) 10.1(L) 9.9(L) 10.6(L)  Hematocrit 36.0 - 46.0 % (None) (None) 33.5(L) 30.9(L) 30.1(L) 31.3(L)  White count 4.0 - 10.5 K/uL (None) (None) 10.5 10.1 8.5 8.5  Platelet count 150 - 400 K/uL (None) (None) 139(L) 115(L) 125(L) 124(L)  Sodium 137 - 147 mmol/L 140 143 142 (None) (None) 140  Potassium 3.4 - 5.3 mmol/L 4.5 5.1 4.0 (None) (None) 3.8  Calcium 8.9 - 10.3 mg/dL (None) (None) 8.5(L) (None) (None) 9.0  Phosphorus - (None) (None) (None) (None) (None) (None)  Creatinine 0.5 - 1.1 mg/dL 1.2(A) 1.3(A) 1.32(H) (None) (None) 1.46(H)  AST - (None) (None) (None) (None) (None) (None)  Alk Phos - (None) (None) (None) (None) (None) (None)  Bilirubin - (None) (None) (None) (None) (None) (None)  Glucose mg/dL 118 147 241(H) (None) (None) 87  Cholesterol - (None) (None) (None) (None) (None) (None)  HDL cholesterol - (None) (None) (None) (None) (None) (None)  Triglycerides - (None) (None) (None) (None) (None) (None)  LDL Direct - (None) (None) (None) (None) (None) (None)  LDL Calc - (None) (None) (None) (None) (None) (None)  Total protein - (None) (None) (None) (None) (None) (None)  Albumin - (None) (None) (None) (None) (None) (None)  Some recent data might be hidden   Lab Results  Component Value Date   TSH 1.32 12/21/2015   Lab Results  Component Value Date   BUN 42 (A) 12/28/2015   BUN 40 (A) 12/21/2015   BUN 47 (H) 11/06/2015   Lab Results  Component Value Date   CREATININE 1.2 (A) 12/28/2015   CREATININE 1.3 (A) 12/21/2015   CREATININE 1.32 (H) 11/06/2015   No results found for: HGBA1C     Assessment/Plan  1. Bursitis of right hip -Reduce prednisone 5 mg every morning  2. CKD (chronic kidney disease), stage III Stable  3. DVT (deep venous  thrombosis), left Continue anticoagulation. Reconsider in December (6 months following her DVT)  4. Essential hypertension Controlled -CMP, future  5. Uncontrolled type 2 diabetes mellitus with stage 2 chronic kidney disease, without long-term current use of insulin (HCC) -A1c in early November 2017  6. Edema, unspecified type Continue compression stockings, Lasix, and spironolactone.  7. Acute blood loss anemia -CBC and early November 2017

## 2016-01-24 DIAGNOSIS — M7071 Other bursitis of hip, right hip: Secondary | ICD-10-CM | POA: Diagnosis not present

## 2016-01-24 DIAGNOSIS — I10 Essential (primary) hypertension: Secondary | ICD-10-CM | POA: Diagnosis not present

## 2016-01-24 DIAGNOSIS — M6281 Muscle weakness (generalized): Secondary | ICD-10-CM | POA: Diagnosis not present

## 2016-01-24 DIAGNOSIS — R262 Difficulty in walking, not elsewhere classified: Secondary | ICD-10-CM | POA: Diagnosis not present

## 2016-01-24 DIAGNOSIS — M25551 Pain in right hip: Secondary | ICD-10-CM | POA: Diagnosis not present

## 2016-01-24 DIAGNOSIS — E782 Mixed hyperlipidemia: Secondary | ICD-10-CM | POA: Diagnosis not present

## 2016-01-25 DIAGNOSIS — E782 Mixed hyperlipidemia: Secondary | ICD-10-CM | POA: Diagnosis not present

## 2016-01-25 DIAGNOSIS — M7071 Other bursitis of hip, right hip: Secondary | ICD-10-CM | POA: Diagnosis not present

## 2016-01-25 DIAGNOSIS — R262 Difficulty in walking, not elsewhere classified: Secondary | ICD-10-CM | POA: Diagnosis not present

## 2016-01-25 DIAGNOSIS — I10 Essential (primary) hypertension: Secondary | ICD-10-CM | POA: Diagnosis not present

## 2016-01-25 DIAGNOSIS — M25551 Pain in right hip: Secondary | ICD-10-CM | POA: Diagnosis not present

## 2016-01-25 DIAGNOSIS — M6281 Muscle weakness (generalized): Secondary | ICD-10-CM | POA: Diagnosis not present

## 2016-01-26 DIAGNOSIS — M7071 Other bursitis of hip, right hip: Secondary | ICD-10-CM | POA: Diagnosis not present

## 2016-01-26 DIAGNOSIS — I10 Essential (primary) hypertension: Secondary | ICD-10-CM | POA: Diagnosis not present

## 2016-01-26 DIAGNOSIS — M6281 Muscle weakness (generalized): Secondary | ICD-10-CM | POA: Diagnosis not present

## 2016-01-26 DIAGNOSIS — R262 Difficulty in walking, not elsewhere classified: Secondary | ICD-10-CM | POA: Diagnosis not present

## 2016-01-26 DIAGNOSIS — M25551 Pain in right hip: Secondary | ICD-10-CM | POA: Diagnosis not present

## 2016-01-26 DIAGNOSIS — E782 Mixed hyperlipidemia: Secondary | ICD-10-CM | POA: Diagnosis not present

## 2016-01-29 DIAGNOSIS — R262 Difficulty in walking, not elsewhere classified: Secondary | ICD-10-CM | POA: Diagnosis not present

## 2016-01-29 DIAGNOSIS — I10 Essential (primary) hypertension: Secondary | ICD-10-CM | POA: Diagnosis not present

## 2016-01-29 DIAGNOSIS — M25551 Pain in right hip: Secondary | ICD-10-CM | POA: Diagnosis not present

## 2016-01-29 DIAGNOSIS — E782 Mixed hyperlipidemia: Secondary | ICD-10-CM | POA: Diagnosis not present

## 2016-01-29 DIAGNOSIS — M7071 Other bursitis of hip, right hip: Secondary | ICD-10-CM | POA: Diagnosis not present

## 2016-01-29 DIAGNOSIS — M6281 Muscle weakness (generalized): Secondary | ICD-10-CM | POA: Diagnosis not present

## 2016-01-31 DIAGNOSIS — R262 Difficulty in walking, not elsewhere classified: Secondary | ICD-10-CM | POA: Diagnosis not present

## 2016-01-31 DIAGNOSIS — I10 Essential (primary) hypertension: Secondary | ICD-10-CM | POA: Diagnosis not present

## 2016-01-31 DIAGNOSIS — E782 Mixed hyperlipidemia: Secondary | ICD-10-CM | POA: Diagnosis not present

## 2016-01-31 DIAGNOSIS — M7071 Other bursitis of hip, right hip: Secondary | ICD-10-CM | POA: Diagnosis not present

## 2016-01-31 DIAGNOSIS — M25551 Pain in right hip: Secondary | ICD-10-CM | POA: Diagnosis not present

## 2016-01-31 DIAGNOSIS — M6281 Muscle weakness (generalized): Secondary | ICD-10-CM | POA: Diagnosis not present

## 2016-02-01 ENCOUNTER — Encounter: Payer: Self-pay | Admitting: Internal Medicine

## 2016-02-01 DIAGNOSIS — M25551 Pain in right hip: Secondary | ICD-10-CM | POA: Diagnosis not present

## 2016-02-01 DIAGNOSIS — M6281 Muscle weakness (generalized): Secondary | ICD-10-CM | POA: Diagnosis not present

## 2016-02-01 DIAGNOSIS — E782 Mixed hyperlipidemia: Secondary | ICD-10-CM | POA: Diagnosis not present

## 2016-02-01 DIAGNOSIS — R262 Difficulty in walking, not elsewhere classified: Secondary | ICD-10-CM | POA: Diagnosis not present

## 2016-02-01 DIAGNOSIS — M7071 Other bursitis of hip, right hip: Secondary | ICD-10-CM | POA: Diagnosis not present

## 2016-02-01 DIAGNOSIS — I10 Essential (primary) hypertension: Secondary | ICD-10-CM | POA: Diagnosis not present

## 2016-02-02 DIAGNOSIS — R262 Difficulty in walking, not elsewhere classified: Secondary | ICD-10-CM | POA: Diagnosis not present

## 2016-02-02 DIAGNOSIS — M6281 Muscle weakness (generalized): Secondary | ICD-10-CM | POA: Diagnosis not present

## 2016-02-02 DIAGNOSIS — M25551 Pain in right hip: Secondary | ICD-10-CM | POA: Diagnosis not present

## 2016-02-02 DIAGNOSIS — E782 Mixed hyperlipidemia: Secondary | ICD-10-CM | POA: Diagnosis not present

## 2016-02-02 DIAGNOSIS — M7071 Other bursitis of hip, right hip: Secondary | ICD-10-CM | POA: Diagnosis not present

## 2016-02-02 DIAGNOSIS — I10 Essential (primary) hypertension: Secondary | ICD-10-CM | POA: Diagnosis not present

## 2016-02-05 DIAGNOSIS — M25551 Pain in right hip: Secondary | ICD-10-CM | POA: Diagnosis not present

## 2016-02-05 DIAGNOSIS — M7071 Other bursitis of hip, right hip: Secondary | ICD-10-CM | POA: Diagnosis not present

## 2016-02-05 DIAGNOSIS — E782 Mixed hyperlipidemia: Secondary | ICD-10-CM | POA: Diagnosis not present

## 2016-02-05 DIAGNOSIS — R262 Difficulty in walking, not elsewhere classified: Secondary | ICD-10-CM | POA: Diagnosis not present

## 2016-02-05 DIAGNOSIS — I10 Essential (primary) hypertension: Secondary | ICD-10-CM | POA: Diagnosis not present

## 2016-02-05 DIAGNOSIS — M6281 Muscle weakness (generalized): Secondary | ICD-10-CM | POA: Diagnosis not present

## 2016-02-06 DIAGNOSIS — M7071 Other bursitis of hip, right hip: Secondary | ICD-10-CM | POA: Diagnosis not present

## 2016-02-06 DIAGNOSIS — M25551 Pain in right hip: Secondary | ICD-10-CM | POA: Diagnosis not present

## 2016-02-06 DIAGNOSIS — I10 Essential (primary) hypertension: Secondary | ICD-10-CM | POA: Diagnosis not present

## 2016-02-06 DIAGNOSIS — E782 Mixed hyperlipidemia: Secondary | ICD-10-CM | POA: Diagnosis not present

## 2016-02-06 DIAGNOSIS — R262 Difficulty in walking, not elsewhere classified: Secondary | ICD-10-CM | POA: Diagnosis not present

## 2016-02-06 DIAGNOSIS — M6281 Muscle weakness (generalized): Secondary | ICD-10-CM | POA: Diagnosis not present

## 2016-02-08 DIAGNOSIS — I10 Essential (primary) hypertension: Secondary | ICD-10-CM | POA: Diagnosis not present

## 2016-02-08 DIAGNOSIS — R262 Difficulty in walking, not elsewhere classified: Secondary | ICD-10-CM | POA: Diagnosis not present

## 2016-02-08 DIAGNOSIS — E782 Mixed hyperlipidemia: Secondary | ICD-10-CM | POA: Diagnosis not present

## 2016-02-08 DIAGNOSIS — M6281 Muscle weakness (generalized): Secondary | ICD-10-CM | POA: Diagnosis not present

## 2016-02-08 DIAGNOSIS — M7071 Other bursitis of hip, right hip: Secondary | ICD-10-CM | POA: Diagnosis not present

## 2016-02-08 DIAGNOSIS — M25551 Pain in right hip: Secondary | ICD-10-CM | POA: Diagnosis not present

## 2016-03-01 ENCOUNTER — Non-Acute Institutional Stay: Payer: Medicare Other | Admitting: Nurse Practitioner

## 2016-03-01 ENCOUNTER — Encounter: Payer: Self-pay | Admitting: Nurse Practitioner

## 2016-03-01 DIAGNOSIS — I5032 Chronic diastolic (congestive) heart failure: Secondary | ICD-10-CM

## 2016-03-01 DIAGNOSIS — E1121 Type 2 diabetes mellitus with diabetic nephropathy: Secondary | ICD-10-CM | POA: Diagnosis not present

## 2016-03-01 DIAGNOSIS — I10 Essential (primary) hypertension: Secondary | ICD-10-CM

## 2016-03-01 DIAGNOSIS — E1165 Type 2 diabetes mellitus with hyperglycemia: Secondary | ICD-10-CM

## 2016-03-01 DIAGNOSIS — N183 Chronic kidney disease, stage 3 unspecified: Secondary | ICD-10-CM

## 2016-03-01 DIAGNOSIS — R609 Edema, unspecified: Secondary | ICD-10-CM

## 2016-03-01 DIAGNOSIS — IMO0002 Reserved for concepts with insufficient information to code with codable children: Secondary | ICD-10-CM

## 2016-03-01 DIAGNOSIS — D62 Acute posthemorrhagic anemia: Secondary | ICD-10-CM

## 2016-03-01 NOTE — Assessment & Plan Note (Signed)
12/16/15 Hgb 12.1, Na 139, K 3.7, Bun 31, creat 1.04, TP 5.0, albumin 3.0, LDL 83, Hgb a1c 9.5 12/19/15 Metformin 500mg  bid.

## 2016-03-01 NOTE — Assessment & Plan Note (Signed)
Hx, continue Eliquis.  

## 2016-03-01 NOTE — Assessment & Plan Note (Signed)
Compensated clinically, continue Furosemide 20mg, Spironolactone 25mg. 12/21/15 BNP 1234.4 

## 2016-03-01 NOTE — Assessment & Plan Note (Signed)
Improved, 1+ BLE, continue Furosemide and Spironolactone.  

## 2016-03-01 NOTE — Assessment & Plan Note (Signed)
12/16/15 Hgb 12.1

## 2016-03-01 NOTE — Assessment & Plan Note (Signed)
BLE 1+, LLE>RLL, continue Furosemide 20mg , Spironolactone 25mg  12/16/15 Hgb 12.1 12/21/15 BNP 1234.4 12/28/15 Na 140, K 4.5, Bun 42, creat 1.2

## 2016-03-01 NOTE — Assessment & Plan Note (Signed)
Controlled, continue Furosemide and Spironolactone.

## 2016-03-01 NOTE — Progress Notes (Signed)
Location:  [facility] Room Number: 23 Place of Service:  ALF [phone]) Provider:  Kwane Rohl, Manxie  NP  Jeanmarie Hubert, MD  Patient Care Team: Estill Dooms, MD as PCP - General (Internal Medicine) Christ Fullenwider Otho Darner, NP as Nurse Practitioner (Internal Medicine)  Extended Emergency Contact Information Primary Emergency Contact: Schwall,Leonard Address: [address] [place] [address] Johnnette Litter of [place] Phone: [phone] Work Phone: [phone] Mobile Phone: [phone] Relation: Son Secondary Emergency Contact: Almira of [place] Phone: [phone] Relation: Friend  Code Status:  DNR Goals of care: Advanced Directive information Advanced Directives 03/01/2016  Does patient have an advance directive? Yes  Type of Paramedic of [place];Out of facility DNR (pink MOST or yellow form)  Does patient want to make changes to advanced directive? No - Patient declined  Copy of advanced directive(s) in chart? Yes  Would patient like information on creating an advanced directive? -  Pre-existing out of facility DNR order (yellow form or pink MOST form) -     Chief Complaint  Patient presents with  . Medical Management of Chronic Issues    HPI:  Pt is a 82 y.o. female seen today for medical management of chronic diseases.    Hx of BLE edema, improved on Furosemide 20mg , Spironolactone 25mg  daily.  12/16/15 Hgb a1c 9.5, Metformin 500mg  bid.  Hx of DVT LLE, taking Eliquis 5mg  daily. Pressure ulcer @ right hip, non healing, no s/s of infection.    Past Medical History:  Diagnosis Date  . Acute blood loss anemia   . Allergy   . Asthma   . AVM (arteriovenous malformation) of colon with hemorrhage   . Bursitis of right hip   . Cataract   . CKD (chronic kidney disease), stage III 11/04/2015  . COLONIC POLYPS 03/26/2005   Qualifier: Diagnosis of  By: Talbert Cage CMA ([place]), June    .  DIVERTICULOSIS, COLON 03/26/2005   Qualifier: Diagnosis of  By: Talbert Cage CMA ([place]), June    . DVT (deep venous thrombosis), left 11/03/2015  . Ecchymosis 12/16/2015  . Hyperglycemia 12/16/2015  . Hyperlipidemia   . Hypertension   . Multiple skin tears 12/16/2015  . Osteopenia   . Stasis edema of both lower extremities 11/04/2015  . Unstable gait 12/16/2015  . Urine incontinence 12/16/2015   Past Surgical History:  Procedure Laterality Date  . ABDOMINAL HYSTERECTOMY  2006   Dr. Maryelizabeth Rowan  . CATARACT EXTRACTION W/ INTRAOCULAR LENS IMPLANT Bilateral 1999/2003   Dr. Rutherford Guys  . COLONOSCOPY N/A 11/06/2015   Procedure: COLONOSCOPY;  Surgeon: Mauri Pole, MD;  Location: [facility];  Service: Endoscopy;  Laterality: N/A;  MAC if available, otherwise moderate sedation    Allergies  Allergen Reactions  . Epinephrine Nausea Only    Unknown.  Marland Kitchen Hydrocodone-Acetaminophen     REACTION: nausea  . Labetalol   . Procaine       Medication List       Accurate as of 03/01/16  2:10 PM. Always use your most recent med list.          calcium carbonate 500 MG chewable tablet Commonly known as:  TUMS - dosed in mg elemental calcium Chew 2 tablets by mouth daily.   ELIQUIS 5 MG Tabs tablet Generic drug:  apixaban Take 5 mg by mouth. Take one tablet twice daily   feeding supplement (PRO-STAT SUGAR FREE 64)  Liqd Take 30 mLs by mouth. Take 30 ml once daily   furosemide 20 MG tablet Commonly known as:  LASIX Take 1 tablet (20 mg total) by mouth daily.   ICAPS MV Tabs Take 1 tablet by mouth daily.   metFORMIN 500 MG tablet Commonly known as:  GLUCOPHAGE Take 500 mg by mouth 2 (two) times daily with a meal.   predniSONE 5 MG tablet Commonly known as:  DELTASONE Take 5 mg by mouth daily with breakfast.   simvastatin 10 MG tablet Commonly known as:  ZOCOR Take 10 mg by mouth daily.   spironolactone 25 MG tablet Commonly known as:  ALDACTONE Take 25 mg by mouth daily.        Review of Systems  Constitutional: Positive for fatigue. Negative for activity change, appetite change, chills, diaphoresis, fever and unexpected weight change.  HENT: Negative for congestion, ear discharge, ear pain, hearing loss, nosebleeds, postnasal drip, rhinorrhea, sore throat, tinnitus, trouble swallowing and voice change.   Eyes: Negative for pain, discharge, redness, itching and visual disturbance.  Respiratory: Negative for cough, choking, shortness of breath and wheezing.   Cardiovascular: Positive for leg swelling (3+ bilateral lower legs). Negative for chest pain and palpitations.  Gastrointestinal: Positive for constipation. Negative for abdominal distention, abdominal pain, blood in stool, diarrhea, nausea and vomiting.       Diverticulosis coli. AV malformations of the colon. Internal hemorrhoids. History of GI bleed. History of decubitus ulceration in the perineal area. Last colonoscopy 11/06/2015 by Dr. Silverio Decamp.  Endocrine: Negative for cold intolerance, heat intolerance, polydipsia, polyphagia and polyuria.  Genitourinary: Positive for decreased urine volume. Negative for difficulty urinating, dysuria, flank pain, frequency, hematuria, pelvic pain, urgency and vaginal discharge.       History of urinary incontinence. CKD 3.  Musculoskeletal: Positive for arthralgias and gait problem. Negative for back pain, myalgias, neck pain and neck stiffness.       Chronic bursitis right hip.  Skin: Positive for wound (Multiple skin tears). Negative for color change, pallor and rash.       Multiple ecchymoses  Allergic/Immunologic: Negative.  Negative for environmental allergies.  Neurological: Negative for dizziness, tremors, seizures, syncope, weakness, numbness and headaches.       Repetitive  Hematological: Negative for adenopathy. Does not bruise/bleed easily.       Acute blood loss anemia in June 2017.  Psychiatric/Behavioral: Negative for agitation, behavioral problems,  confusion, dysphoric mood, hallucinations, sleep disturbance and suicidal ideas. The patient is nervous/anxious. The patient is not hyperactive.     Immunization History  Administered Date(s) Administered  . DTaP 05/13/2005  . Influenza-Unspecified 02/11/2015, 02/22/2016  . Pneumococcal Conjugate-13 05/13/2014  . Pneumococcal Polysaccharide-23 05/13/2014  . Zoster 05/13/2014   Pertinent  Health Maintenance Due  Topic Date Due  . HEMOGLOBIN A1C  [birth year]1927  . FOOT EXAM  09/12/1935  . OPHTHALMOLOGY EXAM  09/12/1935  . URINE MICROALBUMIN  09/12/1935  . PNA vac Low Risk Adult (2 of 2 - PPSV23) 05/14/2015  . INFLUENZA VACCINE  Completed  . DEXA SCAN  Completed   Fall Risk  11/03/2015  Falls in the past year? No   Functional Status Survey:    Vitals:   03/01/16 1209  BP: 100/61  Pulse: 79  Resp: 19  Temp: 97.4 F (36.3 C)  Weight: 143 lb 9.6 oz (65.1 kg)  Height: 5\' 3"  (1.6 m)   Body mass index is 25.44 kg/m. Physical Exam  Labs reviewed:  Recent Labs  11/03/15 2054 11/04/15  [id number] 11/06/15 0429 12/21/15 12/28/15  NA 139 140 142 143 140  K 4.1 3.8 4.0 5.1 4.5  CL 104 108 112*  --   --   CO2 25 25 21*  --   --   GLUCOSE 113* 87 241*  --   --   BUN 55* 54* 47* 40* 42*  CREATININE 1.62* 1.46* 1.32* 1.3* 1.2*  CALCIUM 10.0 9.0 8.5*  --   --     Recent Labs  11/03/15 1226 11/03/15 2054  AST 20 25  ALT 18 21  ALKPHOS 44 43  BILITOT 0.8 1.2  PROT 6.0* 5.7*  ALBUMIN 3.6 3.4*    Recent Labs  11/03/15 2054  11/05/15 0225 11/05/15 0453 11/06/15 0429  WBC 11.4*  < > 8.5 10.1 10.5  NEUTROABS 8.7*  --   --   --   --   HGB 12.4  < > 9.9* 10.1* 11.0*  HCT 36.8  < > 30.1* 30.9* 33.5*  MCV 85.2  < > 87.2 88.0 86.8  PLT 133*  < > 125* 115* 139*  < > = values in this interval not displayed. Lab Results  Component Value Date   TSH 1.32 12/21/2015   No results found for: HGBA1C No results found for: CHOL, HDL, LDLCALC, LDLDIRECT, TRIG, CHOLHDL  Significant  Diagnostic Results in last 30 days:  No results found.  Assessment/Plan Essential hypertension Controlled, continue Furosemide and Spironolactone.    DVT (deep venous thrombosis), left Hx, continue Eliquis.   Chronic diastolic congestive heart failure (HCC) Compensated clinically, continue Furosemide 20mg , Spironolactone 25mg . 12/21/15 BNP 1234.4  DM (diabetes mellitus), type 2, uncontrolled, with renal complications (HCC) 0000000 Hgb 12.1, Na 139, K 3.7, Bun 31, creat 1.04, TP 5.0, albumin 3.0, LDL 83, Hgb a1c 9.5 12/19/15 Metformin 500mg  bid.    CKD (chronic kidney disease), stage III BLE 1+, LLE>RLL, continue Furosemide 20mg , Spironolactone 25mg  12/16/15 Hgb 12.1 12/21/15 BNP 1234.4 12/28/15 Na 140, K 4.5, Bun 42, creat 1.2    Acute blood loss anemia 12/16/15 Hgb 12.1  Edema Improved, 1+ BLE, continue Furosemide and Spironolactone.      Family/ staff Communication: continue AL for care assistance  Labs/tests ordered:  none

## 2016-03-18 DIAGNOSIS — E119 Type 2 diabetes mellitus without complications: Secondary | ICD-10-CM | POA: Diagnosis not present

## 2016-03-18 DIAGNOSIS — D649 Anemia, unspecified: Secondary | ICD-10-CM | POA: Diagnosis not present

## 2016-03-18 LAB — CBC AND DIFFERENTIAL
HCT: 39 % (ref 36–46)
HEMOGLOBIN: 12.2 g/dL (ref 12.0–16.0)
PLATELETS: 248 10*3/uL (ref 150–399)
WBC: 9.1 10*3/mL

## 2016-03-18 LAB — HEMOGLOBIN A1C: HEMOGLOBIN A1C: 7.8

## 2016-03-19 ENCOUNTER — Other Ambulatory Visit: Payer: Self-pay | Admitting: *Deleted

## 2016-03-25 ENCOUNTER — Non-Acute Institutional Stay: Payer: Medicare Other | Admitting: Internal Medicine

## 2016-03-25 ENCOUNTER — Encounter: Payer: Self-pay | Admitting: Internal Medicine

## 2016-03-25 DIAGNOSIS — M7061 Trochanteric bursitis, right hip: Secondary | ICD-10-CM

## 2016-03-25 DIAGNOSIS — E1165 Type 2 diabetes mellitus with hyperglycemia: Secondary | ICD-10-CM | POA: Diagnosis not present

## 2016-03-25 DIAGNOSIS — E1121 Type 2 diabetes mellitus with diabetic nephropathy: Secondary | ICD-10-CM | POA: Diagnosis not present

## 2016-03-25 DIAGNOSIS — IMO0002 Reserved for concepts with insufficient information to code with codable children: Secondary | ICD-10-CM

## 2016-03-25 DIAGNOSIS — R609 Edema, unspecified: Secondary | ICD-10-CM

## 2016-03-25 DIAGNOSIS — I10 Essential (primary) hypertension: Secondary | ICD-10-CM | POA: Diagnosis not present

## 2016-03-25 DIAGNOSIS — L89213 Pressure ulcer of right hip, stage 3: Secondary | ICD-10-CM

## 2016-03-25 DIAGNOSIS — D62 Acute posthemorrhagic anemia: Secondary | ICD-10-CM | POA: Diagnosis not present

## 2016-03-25 DIAGNOSIS — I5032 Chronic diastolic (congestive) heart failure: Secondary | ICD-10-CM

## 2016-03-25 NOTE — Progress Notes (Signed)
Progress Note    Location:  [facility] Room Number: [place] of Service:  ALF [phone]) Provider:  Jeanmarie Hubert, MD  Patient Care Team: Estill Dooms, MD as PCP - General (Internal Medicine) Man Otho Darner, NP as Nurse Practitioner (Internal Medicine)  Extended Emergency Contact Information Primary Emergency Contact: Sackrider,Leonard Address: [address] [place] [address] Johnnette Litter of [place] Phone: [phone] Work Phone: [phone] Mobile Phone: [phone] Relation: Son Secondary Emergency Contact: [place] of [place] Phone: [phone] Relation: Friend  Code Status:  Full Code Goals of care: Advanced Directive information Advanced Directives 03/25/2016  Does patient have an advance directive? Yes  Type of Advance Directive Living will;Out of facility DNR (pink MOST or yellow form)  Does patient want to make changes to advanced directive? -  Copy of advanced directive(s) in chart? Yes  Would patient like information on creating an advanced directive? -  Pre-existing out of facility DNR order (yellow form or pink MOST form) Pink MOST form placed in chart (order not valid for inpatient use)     Chief Complaint  Patient presents with  . Acute Visit    check wound on right hip    HPI:  Pt is a 82 y.o. female seen today for an acute visit for evaluation of the pressure ulcer at the right greater trochanter.  Decubitus ulcer of right hip, stage 3 ([place]) - staff thought ths might be getting inflamed and was worsening. Patient says it is not creating any pain. Seems smaller.  Trochanteric bursitis of right hip - worse discomfort down the right leg. Had prednisone reduced 2 months ago and she thinks that it should be "resumed". Still on 5 mg qAM.  Uncontrolled type 2 diabetes mellitus with diabetic nephropathy, without long-term current use of insulin (HCC) - better control since prednisone  was reduced  Chronic diastolic congestive heart failure ([place]) - compensated  Essential hypertension - controlled  Edema, unspecified type - unchanged. Wearing compression hose.  Acute blood loss anemia -  Last Hgb was normal at 12.2.      Past Medical History:  Diagnosis Date  . Acute blood loss anemia   . Allergy   . Asthma   . AVM (arteriovenous malformation) of colon with hemorrhage   . Bursitis of right hip   . Cataract   . CKD (chronic kidney disease), stage III 11/04/2015  . COLONIC POLYPS 03/26/2005   Qualifier: Diagnosis of  By: Talbert Cage CMA ([place]), June    . DIVERTICULOSIS, COLON 03/26/2005   Qualifier: Diagnosis of  By: Talbert Cage CMA ([place]), June    . DVT (deep venous thrombosis), left 11/03/2015  . Ecchymosis 12/16/2015  . Hyperglycemia 12/16/2015  . Hyperlipidemia   . Hypertension   . Multiple skin tears 12/16/2015  . Osteopenia   . Stasis edema of both lower extremities 11/04/2015  . Unstable gait 12/16/2015  . Urine incontinence 12/16/2015   Past Surgical History:  Procedure Laterality Date  . ABDOMINAL HYSTERECTOMY  2006   Dr. Maryelizabeth Rowan  . CATARACT EXTRACTION W/ INTRAOCULAR LENS IMPLANT Bilateral 1999/2003   Dr. Rutherford Guys  . COLONOSCOPY N/A 11/06/2015   Procedure: COLONOSCOPY;  Surgeon: Mauri Pole, MD;  Location: [facility];  Service: Endoscopy;  Laterality: N/A;  MAC if available, otherwise moderate sedation    Allergies  Allergen Reactions  . Epinephrine Nausea Only    Unknown.  Marland Kitchen  Hydrocodone-Acetaminophen     REACTION: nausea  . Labetalol   . Procaine       Medication List       Accurate as of 03/25/16  3:26 PM. Always use your most recent med list.          calcium carbonate 500 MG chewable tablet Commonly known as:  TUMS - dosed in mg elemental calcium Chew 2 tablets by mouth daily.   ELIQUIS 5 MG Tabs tablet Generic drug:  apixaban Take 5 mg by mouth. Take one tablet twice daily   feeding supplement (PRO-STAT SUGAR FREE 64)  Liqd Take 30 mLs by mouth. Take 30 ml once daily   furosemide 20 MG tablet Commonly known as:  LASIX Take 1 tablet (20 mg total) by mouth daily.   ICAPS MV Tabs Take 1 tablet by mouth daily.   multivitamin with minerals tablet Take 1 tablet by mouth. Take one tablet once daily   metFORMIN 500 MG tablet Commonly known as:  GLUCOPHAGE Take 500 mg by mouth 2 (two) times daily with a meal.   predniSONE 5 MG tablet Commonly known as:  DELTASONE Take 5 mg by mouth daily with breakfast.   RESOURCE 2.0 Liqd Take by mouth. Take 120 ml twice a day between meals to promote wound healing   simvastatin 10 MG tablet Commonly known as:  ZOCOR Take 10 mg by mouth daily.   spironolactone 25 MG tablet Commonly known as:  ALDACTONE Take 25 mg by mouth daily.       Review of Systems  Constitutional: Positive for activity change and fatigue. Negative for appetite change, chills, diaphoresis, fever and unexpected weight change.  HENT: Negative for congestion, ear discharge, ear pain, hearing loss, nosebleeds, postnasal drip, rhinorrhea, sore throat, tinnitus, trouble swallowing and voice change.   Eyes: Negative for pain, discharge, redness, itching and visual disturbance.  Respiratory: Negative for cough, choking, shortness of breath and wheezing.   Cardiovascular: Positive for leg swelling (3+ bilateral lower legs). Negative for chest pain and palpitations.  Gastrointestinal: Positive for constipation. Negative for abdominal distention, abdominal pain, blood in stool, diarrhea, nausea and vomiting.       Diverticulosis coli. AV malformations of the colon. Internal hemorrhoids. History of GI bleed. History of decubitus ulceration in the perineal area. Last colonoscopy 11/06/2015 by Dr. Silverio Decamp.  Endocrine: Negative for cold intolerance, heat intolerance, polydipsia, polyphagia and polyuria.  Genitourinary: Positive for decreased urine volume. Negative for difficulty urinating, dysuria, flank  pain, frequency, hematuria, pelvic pain, urgency and vaginal discharge.       History of urinary incontinence. CKD 3.  Musculoskeletal: Positive for arthralgias and gait problem. Negative for back pain, myalgias, neck pain and neck stiffness.       Chronic bursitis right hip.  Skin: Positive for wound (Multiple skin tears). Negative for color change, pallor and rash.       Multiple ecchymoses. Healing ulcer of the right greater trochanter.  Allergic/Immunologic: Negative.  Negative for environmental allergies.  Neurological: Negative for dizziness, tremors, seizures, syncope, weakness, numbness and headaches.       Repetitive  Hematological: Negative for adenopathy. Does not bruise/bleed easily.       Acute blood loss anemia in June 2017 has resolved.  Psychiatric/Behavioral: Negative for agitation, behavioral problems, confusion, dysphoric mood, hallucinations, sleep disturbance and suicidal ideas. The patient is nervous/anxious. The patient is not hyperactive.     Immunization History  Administered Date(s) Administered  . DTaP 05/13/2005  . Influenza-Unspecified 02/11/2015, 02/22/2016  .  Pneumococcal Conjugate-13 05/13/2014  . Pneumococcal Polysaccharide-23 05/13/2014  . Zoster 05/13/2014   Pertinent  Health Maintenance Due  Topic Date Due  . FOOT EXAM  09/12/1935  . OPHTHALMOLOGY EXAM  09/12/1935  . URINE MICROALBUMIN  09/12/1935  . PNA vac Low Risk Adult (2 of 2 - PPSV23) 05/14/2015  . HEMOGLOBIN A1C  09/15/2016  . INFLUENZA VACCINE  Completed  . DEXA SCAN  Completed   Fall Risk  11/03/2015  Falls in the past year? No    Vitals:   03/25/16 1517  BP: 108/64  Pulse: 87  Resp: 18  Temp: 97.7 F (36.5 C)  Weight: 142 lb (64.4 kg)  Height: 5\' 3"  (1.6 m)   Body mass index is 25.15 kg/m. Physical Exam  Constitutional: She is oriented to person, place, and time. She appears well-developed and well-nourished. No distress.  Elderly and fragile.  HENT:  Right Ear: External  ear normal.  Left Ear: External ear normal.  Nose: Nose normal.  Mouth/Throat: Oropharynx is clear and moist. No oropharyngeal exudate.  Eyes: Conjunctivae and EOM are normal. Pupils are equal, round, and reactive to light. No scleral icterus.  Neck: No JVD present. No tracheal deviation present. No thyromegaly present.  Cardiovascular: Normal rate, regular rhythm and normal heart sounds.  Exam reveals no gallop and no friction rub.   No murmur heard. Unable to feel DP and PT bilaterally due to edema.  Pulmonary/Chest: Effort normal. No respiratory distress. She has no wheezes. She has no rales. She exhibits no tenderness.  Abdominal: She exhibits no distension and no mass. There is no tenderness.  Musculoskeletal: Normal range of motion. She exhibits edema (3+ bipedal from the knee to the foot). She exhibits no tenderness.  Tender at the right hip and down the lateral right leg. Using walker with 2 front wheels and rear skids. Very unstable with walking.  Lymphadenopathy:    She has no cervical adenopathy.  Neurological: She is alert and oriented to person, place, and time. No cranial nerve deficit. Coordination abnormal.  Skin: No rash noted. She is not diaphoretic. No erythema. No pallor.  Ulceration of the right greater trochanter. Approximately 3 mm in diameter. Some oozing.  Psychiatric: She has a normal mood and affect. Her behavior is normal. Judgment and thought content normal.    Labs reviewed:  Recent Labs  11/03/15 2054 11/04/15 0452 11/06/15 0429 12/21/15 12/28/15  NA 139 140 142 143 140  K 4.1 3.8 4.0 5.1 4.5  CL 104 108 112*  --   --   CO2 25 25 21*  --   --   GLUCOSE 113* 87 241*  --   --   BUN 55* 54* 47* 40* 42*  CREATININE 1.62* 1.46* 1.32* 1.3* 1.2*  CALCIUM 10.0 9.0 8.5*  --   --     Recent Labs  11/03/15 1226 11/03/15 2054  AST 20 25  ALT 18 21  ALKPHOS 44 43  BILITOT 0.8 1.2  PROT 6.0* 5.7*  ALBUMIN 3.6 3.4*    Recent Labs  11/03/15 2054   11/05/15 0225 11/05/15 0453 11/06/15 0429 03/18/16  WBC 11.4*  < > 8.5 10.1 10.5 9.1  NEUTROABS 8.7*  --   --   --   --   --   HGB 12.4  < > 9.9* 10.1* 11.0* 12.2  HCT 36.8  < > 30.1* 30.9* 33.5* 39  MCV 85.2  < > 87.2 88.0 86.8  --   PLT 133*  < >  125* 115* 139* 248  < > = values in this interval not displayed. Lab Results  Component Value Date   TSH 1.32 12/21/2015   Lab Results  Component Value Date   HGBA1C 7.8 03/18/2016    Assessment/Plan 1. Decubitus ulcer of right hip, stage 3 (HCC) -much smaller than when I last assessed it. Continue current packing.  2. Trochanteric bursitis of right hip Although she has some discomfort, I do not want to go back to a higher dose of prednisone.   3. Uncontrolled type 2 diabetes mellitus with diabetic nephropathy, without long-term current use of insulin ([place]) -better controlled since prednisone was reduced.  4. Chronic diastolic congestive heart failure ([place]) compensated  5. Essential hypertension controlled  6. Edema, unspecified type Unchanged. Continue compression stockings.  7. Acute blood loss anemia resolved

## 2016-03-28 DIAGNOSIS — H353131 Nonexudative age-related macular degeneration, bilateral, early dry stage: Secondary | ICD-10-CM | POA: Diagnosis not present

## 2016-03-28 DIAGNOSIS — H5213 Myopia, bilateral: Secondary | ICD-10-CM | POA: Diagnosis not present

## 2016-03-28 DIAGNOSIS — E119 Type 2 diabetes mellitus without complications: Secondary | ICD-10-CM | POA: Diagnosis not present

## 2016-03-28 DIAGNOSIS — H524 Presbyopia: Secondary | ICD-10-CM | POA: Diagnosis not present

## 2016-03-28 DIAGNOSIS — H52203 Unspecified astigmatism, bilateral: Secondary | ICD-10-CM | POA: Diagnosis not present

## 2016-03-28 DIAGNOSIS — I10 Essential (primary) hypertension: Secondary | ICD-10-CM | POA: Diagnosis not present

## 2016-03-28 LAB — BASIC METABOLIC PANEL
BUN: 52 mg/dL — AB (ref 4–21)
CREATININE: 1.2 mg/dL — AB (ref <=1.1)
Glucose: 133 mg/dL
POTASSIUM: 4.1 mmol/L (ref 3.4–5.3)
Sodium: 141 mmol/L (ref 137–147)

## 2016-03-29 ENCOUNTER — Other Ambulatory Visit: Payer: Self-pay | Admitting: *Deleted

## 2016-04-12 ENCOUNTER — Non-Acute Institutional Stay: Payer: Medicare Other | Admitting: Nurse Practitioner

## 2016-04-12 ENCOUNTER — Encounter: Payer: Self-pay | Admitting: Nurse Practitioner

## 2016-04-12 DIAGNOSIS — E1121 Type 2 diabetes mellitus with diabetic nephropathy: Secondary | ICD-10-CM

## 2016-04-12 DIAGNOSIS — N183 Chronic kidney disease, stage 3 unspecified: Secondary | ICD-10-CM

## 2016-04-12 DIAGNOSIS — G8929 Other chronic pain: Secondary | ICD-10-CM

## 2016-04-12 DIAGNOSIS — IMO0002 Reserved for concepts with insufficient information to code with codable children: Secondary | ICD-10-CM

## 2016-04-12 DIAGNOSIS — R609 Edema, unspecified: Secondary | ICD-10-CM | POA: Diagnosis not present

## 2016-04-12 DIAGNOSIS — I5032 Chronic diastolic (congestive) heart failure: Secondary | ICD-10-CM | POA: Diagnosis not present

## 2016-04-12 DIAGNOSIS — M5416 Radiculopathy, lumbar region: Secondary | ICD-10-CM

## 2016-04-12 DIAGNOSIS — G629 Polyneuropathy, unspecified: Secondary | ICD-10-CM | POA: Insufficient documentation

## 2016-04-12 DIAGNOSIS — E1165 Type 2 diabetes mellitus with hyperglycemia: Secondary | ICD-10-CM | POA: Diagnosis not present

## 2016-04-12 DIAGNOSIS — I10 Essential (primary) hypertension: Secondary | ICD-10-CM

## 2016-04-12 NOTE — Assessment & Plan Note (Signed)
Will try Gabapentin 100mg  bid, observe.

## 2016-04-12 NOTE — Assessment & Plan Note (Signed)
Compensated clinically, continue Furosemide 20mg, Spironolactone 25mg. 12/21/15 BNP 1234.4 

## 2016-04-12 NOTE — Assessment & Plan Note (Signed)
03/28/16 Bun 52, creat 1.22

## 2016-04-12 NOTE — Progress Notes (Signed)
Location:  [place] Room Number: 23 Place of Service:  ALF [phone]) Provider: Rateel Beldin, [place]  NP  Jeanmarie Hubert, MD  Patient Care Team: Estill Dooms, MD as PCP - General (Internal Medicine) [place] Otho Darner, NP as Nurse Practitioner (Internal Medicine)  Extended Emergency Contact Information Primary Emergency Contact: Fouche,Leonard Address: [address] [place] [address] Johnnette Litter of [place] Phone: [phone] Work Phone: [phone] Mobile Phone: [phone] Relation: Son Secondary Emergency Contact: [place] of [place] Phone: [phone] Relation: Friend  Code Status:  DNR Goals of care: Advanced Directive information Advanced Directives 04/12/2016  Does Patient Have a Medical Advance Directive? Yes  Type of Advance Directive Living will;Out of facility DNR (pink MOST or yellow form)  Does patient want to make changes to medical advance directive? No - Patient declined  Copy of [place] in Chart? Yes  Would patient like information on creating a medical advance directive? -  Pre-existing out of facility DNR order (yellow form or pink MOST form) -     Chief Complaint  Patient presents with  . Acute Visit    Decubitus ulcer to (R) hip.    HPI:  Pt is a 82 y.o. female seen today for an acute visit for healed decubitus right hip, scared tissue over the bony prominent, protective foam dressing and off pressure are needed.     Hx of BLE edema, improved on Furosemide 20mg , Spironolactone 25mg  daily. 12/16/15 Hgb a1c 9.5, 03/18/16 Hgb a1c 7.8, Metformin 500mg  bid.  Hx of DVT LLE, taking Eliquis 5mg  daily. Pressure ulcer @ right hip, healed. C/o pain with movement from her lower  Back to legs.                  Past Medical History:  Diagnosis Date  . Acute blood loss anemia   . Allergy   . Asthma   . AVM (arteriovenous malformation) of colon with hemorrhage   . Bursitis of  right hip   . Cataract   . CKD (chronic kidney disease), stage III 11/04/2015  . COLONIC POLYPS 03/26/2005   Qualifier: Diagnosis of  By: Talbert Cage CMA ([place]), June    . DIVERTICULOSIS, COLON 03/26/2005   Qualifier: Diagnosis of  By: Talbert Cage CMA ([place]), June    . DVT (deep venous thrombosis), left 11/03/2015  . Ecchymosis 12/16/2015  . Hyperglycemia 12/16/2015  . Hyperlipidemia   . Hypertension   . Multiple skin tears 12/16/2015  . Osteopenia   . Stasis edema of both lower extremities 11/04/2015  . Unstable gait 12/16/2015  . Urine incontinence 12/16/2015   Past Surgical History:  Procedure Laterality Date  . ABDOMINAL HYSTERECTOMY  2006   Dr. Maryelizabeth Rowan  . CATARACT EXTRACTION W/ INTRAOCULAR LENS IMPLANT Bilateral 1999/2003   Dr. Rutherford Guys  . COLONOSCOPY N/A 11/06/2015   Procedure: COLONOSCOPY;  Surgeon: Mauri Pole, MD;  Location: [facility];  Service: Endoscopy;  Laterality: N/A;  MAC if available, otherwise moderate sedation    Allergies  Allergen Reactions  . Epinephrine Nausea Only    Unknown.  Marland Kitchen Hydrocodone-Acetaminophen     REACTION: nausea  . Labetalol   . Procaine       Medication List       Accurate as of 04/12/16  4:05 PM. Always use your most recent med list.          calcium carbonate 500 MG  chewable tablet Commonly known as:  TUMS - dosed in mg elemental calcium Chew 2 tablets by mouth daily.   ELIQUIS 5 MG Tabs tablet Generic drug:  apixaban Take 5 mg by mouth. Take one tablet twice daily   feeding supplement (PRO-STAT SUGAR FREE 64) Liqd Take 30 mLs by mouth. Take 30 ml once daily   furosemide 20 MG tablet Commonly known as:  LASIX Take 1 tablet (20 mg total) by mouth daily.   ICAPS MV Tabs Take 1 tablet by mouth daily.   multivitamin with minerals tablet Take 1 tablet by mouth. Take one tablet once daily   metFORMIN 500 MG tablet Commonly known as:  GLUCOPHAGE Take 500 mg by mouth 2 (two) times daily with a meal.   predniSONE 5 MG  tablet Commonly known as:  DELTASONE Take 5 mg by mouth daily with breakfast.   RESOURCE 2.0 Liqd Take by mouth. Take 120 ml twice a day between meals to promote wound healing   simvastatin 10 MG tablet Commonly known as:  ZOCOR Take 10 mg by mouth daily.   spironolactone 25 MG tablet Commonly known as:  ALDACTONE Take 25 mg by mouth daily.       Review of Systems  Constitutional: Positive for activity change and fatigue. Negative for appetite change, chills, diaphoresis, fever and unexpected weight change.  HENT: Negative for congestion, ear discharge, ear pain, hearing loss, nosebleeds, postnasal drip, rhinorrhea, sore throat, tinnitus, trouble swallowing and voice change.   Eyes: Negative for pain, discharge, redness, itching and visual disturbance.  Respiratory: Negative for cough, choking, shortness of breath and wheezing.   Cardiovascular: Positive for leg swelling (3+ bilateral lower legs). Negative for chest pain and palpitations.  Gastrointestinal: Positive for constipation. Negative for abdominal distention, abdominal pain, blood in stool, diarrhea, nausea and vomiting.       Diverticulosis coli. AV malformations of the colon. Internal hemorrhoids. History of GI bleed. History of decubitus ulceration in the perineal area. Last colonoscopy 11/06/2015 by Dr. Silverio Decamp.  Endocrine: Negative for cold intolerance, heat intolerance, polydipsia, polyphagia and polyuria.  Genitourinary: Positive for decreased urine volume. Negative for difficulty urinating, dysuria, flank pain, frequency, hematuria, pelvic pain, urgency and vaginal discharge.       History of urinary incontinence. CKD 3.  Musculoskeletal: Positive for arthralgias and gait problem. Negative for back pain, myalgias, neck pain and neck stiffness.       Chronic bursitis right hip.  Skin: Positive for wound (Multiple skin tears). Negative for color change, pallor and rash.       Multiple ecchymoses. Healing ulcer of the  right greater trochanter.  Allergic/Immunologic: Negative.  Negative for environmental allergies.  Neurological: Negative for dizziness, tremors, seizures, syncope, weakness, numbness and headaches.       Repetitive  Hematological: Negative for adenopathy. Does not bruise/bleed easily.       Acute blood loss anemia in June 2017 has resolved.  Psychiatric/Behavioral: Negative for agitation, behavioral problems, confusion, dysphoric mood, hallucinations, sleep disturbance and suicidal ideas. The patient is nervous/anxious. The patient is not hyperactive.     Immunization History  Administered Date(s) Administered  . DTaP 05/13/2005  . Influenza-Unspecified 02/11/2015, 02/22/2016  . Pneumococcal Conjugate-13 05/13/2014  . Pneumococcal Polysaccharide-23 05/13/2014  . Zoster 05/13/2014   Pertinent  Health Maintenance Due  Topic Date Due  . FOOT EXAM  09/12/1935  . OPHTHALMOLOGY EXAM  09/12/1935  . URINE MICROALBUMIN  09/12/1935  . PNA vac Low Risk Adult (2 of 2 - PPSV23)  05/14/2015  . HEMOGLOBIN A1C  09/15/2016  . INFLUENZA VACCINE  Completed  . DEXA SCAN  Completed   Fall Risk  11/03/2015  Falls in the past year? No   Functional Status Survey:    Vitals:   04/12/16 1418  BP: 109/61  Pulse: 84  Resp: 20  Temp: 98 F (36.7 C)  SpO2: 96%  Weight: 143 lb 3.2 oz (65 kg)   Body mass index is 25.37 kg/m. Physical Exam  Constitutional: She is oriented to person, place, and time. She appears well-developed and well-nourished. No distress.  Elderly and fragile.  HENT:  Right Ear: External ear normal.  Left Ear: External ear normal.  Nose: Nose normal.  Mouth/Throat: Oropharynx is clear and moist. No oropharyngeal exudate.  Eyes: Conjunctivae and EOM are normal. Pupils are equal, round, and reactive to light. No scleral icterus.  Neck: No JVD present. No tracheal deviation present. No thyromegaly present.  Cardiovascular: Normal rate, regular rhythm and normal heart sounds.   Exam reveals no gallop and no friction rub.   No murmur heard. Unable to feel DP and PT bilaterally due to edema.  Pulmonary/Chest: Effort normal. No respiratory distress. She has no wheezes. She has no rales. She exhibits no tenderness.  Abdominal: She exhibits no distension and no mass. There is no tenderness.  Musculoskeletal: Normal range of motion. She exhibits edema (3+ bipedal from the knee to the foot). She exhibits no tenderness.  Tender at the right hip and down the lateral right leg. Using walker with 2 front wheels and rear skids. Very unstable with walking.  Lymphadenopathy:    She has no cervical adenopathy.  Neurological: She is alert and oriented to person, place, and time. No cranial nerve deficit. Coordination abnormal.  Skin: No rash noted. She is not diaphoretic. No erythema. No pallor.  Ulceration of the right greater trochanter. Approximately 3 mm in diameter. Some oozing.  Psychiatric: She has a normal mood and affect. Her behavior is normal. Judgment and thought content normal.    Labs reviewed:  Recent Labs  11/03/15 2054 11/04/15 0452 11/06/15 0429 12/21/15 12/28/15 03/28/16  NA 139 140 142 143 140 141  K 4.1 3.8 4.0 5.1 4.5 4.1  CL 104 108 112*  --   --   --   CO2 25 25 21*  --   --   --   GLUCOSE 113* 87 241*  --   --   --   BUN 55* 54* 47* 40* 42* 52*  CREATININE 1.62* 1.46* 1.32* 1.3* 1.2* 1.2*  CALCIUM 10.0 9.0 8.5*  --   --   --     Recent Labs  11/03/15 1226 11/03/15 2054  AST 20 25  ALT 18 21  ALKPHOS 44 43  BILITOT 0.8 1.2  PROT 6.0* 5.7*  ALBUMIN 3.6 3.4*    Recent Labs  11/03/15 2054  11/05/15 0225 11/05/15 0453 11/06/15 0429 03/18/16  WBC 11.4*  < > 8.5 10.1 10.5 9.1  NEUTROABS 8.7*  --   --   --   --   --   HGB 12.4  < > 9.9* 10.1* 11.0* 12.2  HCT 36.8  < > 30.1* 30.9* 33.5* 39  MCV 85.2  < > 87.2 88.0 86.8  --   PLT 133*  < > 125* 115* 139* 248  < > = values in this interval not displayed. Lab Results  Component Value  Date   TSH 1.32 12/21/2015   Lab Results  Component  Value Date   HGBA1C 7.8 03/18/2016   No results found for: CHOL, HDL, LDLCALC, LDLDIRECT, TRIG, CHOLHDL  Significant Diagnostic Results in last 30 days:  No results found.  Assessment/Plan Chronic radicular pain of lower back Will try Gabapentin 100mg  bid, observe.   Essential hypertension Controlled, continue Furosemide and Spironolactone. 03/28/16 Na 141, K 4.1, Bun 52, creat 1.22    DVT (deep venous thrombosis), left Hx, continue Eliquis.   Chronic diastolic congestive heart failure ([place]) Compensated clinically, continue Furosemide 20mg , Spironolactone 25mg . 12/21/15 BNP 1234.4   DM (diabetes mellitus), type 2, uncontrolled, with renal complications ([place]) [id number] Metformin 500mg  bid. 03/18/16 Hgb 7.8  CKD (chronic kidney disease), stage III 03/28/16 Bun 52, creat 1.22  Edema Improved, 1+ BLE, continue Furosemide and Spironolactone.      Family/ staff Communication: AL  Labs/tests ordered:  none

## 2016-04-12 NOTE — Assessment & Plan Note (Addendum)
Controlled, continue Furosemide and Spironolactone. 03/28/16 Na 141, K 4.1, Bun 52, creat 1.22

## 2016-04-12 NOTE — Assessment & Plan Note (Signed)
Improved, 1+ BLE, continue Furosemide and Spironolactone.  

## 2016-04-12 NOTE — Assessment & Plan Note (Signed)
Hx, continue Eliquis.  

## 2016-04-12 NOTE — Assessment & Plan Note (Signed)
12/19/15 Metformin 500mg bid. 03/18/16 Hgb 7.8 

## 2016-04-19 DIAGNOSIS — L602 Onychogryphosis: Secondary | ICD-10-CM | POA: Diagnosis not present

## 2016-04-19 DIAGNOSIS — M2041 Other hammer toe(s) (acquired), right foot: Secondary | ICD-10-CM | POA: Diagnosis not present

## 2016-04-19 DIAGNOSIS — M79672 Pain in left foot: Secondary | ICD-10-CM | POA: Diagnosis not present

## 2016-04-19 DIAGNOSIS — M79671 Pain in right foot: Secondary | ICD-10-CM | POA: Diagnosis not present

## 2016-04-23 ENCOUNTER — Encounter: Payer: Self-pay | Admitting: Nurse Practitioner

## 2016-04-23 ENCOUNTER — Non-Acute Institutional Stay: Payer: Medicare Other | Admitting: Nurse Practitioner

## 2016-04-23 DIAGNOSIS — I5032 Chronic diastolic (congestive) heart failure: Secondary | ICD-10-CM | POA: Diagnosis not present

## 2016-04-23 DIAGNOSIS — IMO0002 Reserved for concepts with insufficient information to code with codable children: Secondary | ICD-10-CM

## 2016-04-23 DIAGNOSIS — G8929 Other chronic pain: Secondary | ICD-10-CM

## 2016-04-23 DIAGNOSIS — I10 Essential (primary) hypertension: Secondary | ICD-10-CM | POA: Diagnosis not present

## 2016-04-23 DIAGNOSIS — E1165 Type 2 diabetes mellitus with hyperglycemia: Secondary | ICD-10-CM

## 2016-04-23 DIAGNOSIS — N183 Chronic kidney disease, stage 3 unspecified: Secondary | ICD-10-CM

## 2016-04-23 DIAGNOSIS — E1121 Type 2 diabetes mellitus with diabetic nephropathy: Secondary | ICD-10-CM

## 2016-04-23 DIAGNOSIS — M5416 Radiculopathy, lumbar region: Secondary | ICD-10-CM

## 2016-04-23 DIAGNOSIS — R609 Edema, unspecified: Secondary | ICD-10-CM

## 2016-04-23 NOTE — Assessment & Plan Note (Signed)
Compensated clinically, continue Furosemide 20mg, Spironolactone 25mg. 12/21/15 BNP 1234.4 

## 2016-04-23 NOTE — Assessment & Plan Note (Signed)
Improved, 1+ BLE, continue Furosemide and Spironolactone.  

## 2016-04-23 NOTE — Assessment & Plan Note (Signed)
12/19/15 Metformin 500mg bid. 03/18/16 Hgb 7.8 

## 2016-04-23 NOTE — Assessment & Plan Note (Addendum)
Adding Tylenol 500mg  bid, continue Gabapentin 100mg  bid, observe. X-ray lumbar spine, R hip. May consider Ortho consult if no better.

## 2016-04-23 NOTE — Assessment & Plan Note (Signed)
03/28/16 Bun 52, creat 1.22

## 2016-04-23 NOTE — Progress Notes (Signed)
Location:  [facility] Room Number: 23 Place of Service:  SNF (31) Provider:  Delise Simenson, Manxie  NP Jeanmarie Hubert, MD  Patient Care Team: Estill Dooms, MD as PCP - General (Internal Medicine) Shaleen Talamantez Otho Darner, NP as Nurse Practitioner (Internal Medicine)  Extended Emergency Contact Information Primary Emergency Contact: Byrd,Leonard Address: [address]          [place] [address] Johnnette Litter of [place] Phone: [phone] Work Phone: [phone] Mobile Phone: [phone] Relation: Son Secondary Emergency Contact: [place] of [place] Phone: [phone] Relation: Friend  Code Status:  DNR Goals of care: Advanced Directive information Advanced Directives 04/23/2016  Does Patient Have a Medical Advance Directive? Yes  Type of Advance Directive Living will;Out of facility DNR (pink MOST or yellow form)  Does patient want to make changes to medical advance directive? -  Copy of [place] in Chart? Yes  Would patient like information on creating a medical advance directive? -  Pre-existing out of facility DNR order (yellow form or pink MOST form) Pink MOST form placed in chart (order not valid for inpatient use)     Chief Complaint  Patient presents with  . Acute Visit    Rt hip pain, extends down her leg    HPI:  Pt is a 82 y.o. female seen today for an acute visit for chronic right hip bursitis, pain is more prominent since she ambulates more, Gabapentin 100mg  bid are not adequate. The wound is healed.   Hx of BLE edema, improved on Furosemide 20mg , Spironolactone 25mg  daily. 12/16/15 Hgb a1c 9.5, 03/18/16 Hgb a1c 7.8, Metformin 500mg  bid. Hx of DVT LLE, taking Eliquis 5mg  daily. Pressure ulcer @ right hip, healed. C/o pain with movement from her lower  Back to legs.      Past Medical History:  Diagnosis Date  . Acute blood loss anemia   . Allergy   . Asthma   . AVM (arteriovenous  malformation) of colon with hemorrhage   . Bursitis of right hip   . Cataract   . CKD (chronic kidney disease), stage III 11/04/2015  . COLONIC POLYPS 03/26/2005   Qualifier: Diagnosis of  By: Talbert Cage CMA ([place]), June    . DIVERTICULOSIS, COLON 03/26/2005   Qualifier: Diagnosis of  By: Talbert Cage CMA ([place]), June    . DVT (deep venous thrombosis), left 11/03/2015  . Ecchymosis 12/16/2015  . Hyperglycemia 12/16/2015  . Hyperlipidemia   . Hypertension   . Multiple skin tears 12/16/2015  . Osteopenia   . Stasis edema of both lower extremities 11/04/2015  . Unstable gait 12/16/2015  . Urine incontinence 12/16/2015   Past Surgical History:  Procedure Laterality Date  . ABDOMINAL HYSTERECTOMY  2006   Dr. Maryelizabeth Rowan  . CATARACT EXTRACTION W/ INTRAOCULAR LENS IMPLANT Bilateral 1999/2003   Dr. Rutherford Guys  . COLONOSCOPY N/A 11/06/2015   Procedure: COLONOSCOPY;  Surgeon: Mauri Pole, MD;  Location: [facility];  Service: Endoscopy;  Laterality: N/A;  MAC if available, otherwise moderate sedation    Allergies  Allergen Reactions  . Epinephrine Nausea Only    Unknown.  Marland Kitchen Hydrocodone-Acetaminophen     REACTION: nausea  . Labetalol   . Procaine       Medication List       Accurate as of 04/23/16  2:12 PM. Always use your most recent med list.          calcium carbonate 500 MG chewable  tablet Commonly known as:  TUMS - dosed in mg elemental calcium Chew 2 tablets by mouth daily.   ELIQUIS 5 MG Tabs tablet Generic drug:  apixaban Take 5 mg by mouth. Take one tablet twice daily   feeding supplement (PRO-STAT SUGAR FREE 64) Liqd Take 30 mLs by mouth. Take 30 ml once daily   furosemide 20 MG tablet Commonly known as:  LASIX Take 1 tablet (20 mg total) by mouth daily.   gabapentin 100 MG capsule Commonly known as:  NEURONTIN Take 100 mg by mouth 2 (two) times daily.   ICAPS MV Tabs Take 1 tablet by mouth daily.   multivitamin with minerals tablet Take 1 tablet by mouth.  Take one tablet once daily   metFORMIN 500 MG tablet Commonly known as:  GLUCOPHAGE Take 500 mg by mouth 2 (two) times daily with a meal.   predniSONE 5 MG tablet Commonly known as:  DELTASONE Take 5 mg by mouth daily with breakfast.   RESOURCE 2.0 Liqd Take by mouth. Take 120 ml twice a day between meals to promote wound healing   simvastatin 10 MG tablet Commonly known as:  ZOCOR Take 10 mg by mouth daily.   spironolactone 25 MG tablet Commonly known as:  ALDACTONE Take 25 mg by mouth daily.       Review of Systems  Constitutional: Positive for activity change and fatigue. Negative for appetite change, chills, diaphoresis, fever and unexpected weight change.  HENT: Negative for congestion, ear discharge, ear pain, hearing loss, nosebleeds, postnasal drip, rhinorrhea, sore throat, tinnitus, trouble swallowing and voice change.   Eyes: Negative for pain, discharge, redness, itching and visual disturbance.  Respiratory: Negative for cough, choking, shortness of breath and wheezing.   Cardiovascular: Positive for leg swelling (3+ bilateral lower legs). Negative for chest pain and palpitations.  Gastrointestinal: Positive for constipation. Negative for abdominal distention, abdominal pain, blood in stool, diarrhea, nausea and vomiting.       Diverticulosis coli. AV malformations of the colon. Internal hemorrhoids. History of GI bleed. History of decubitus ulceration in the perineal area. Last colonoscopy 11/06/2015 by Dr. Silverio Decamp.  Endocrine: Negative for cold intolerance, heat intolerance, polydipsia, polyphagia and polyuria.  Genitourinary: Positive for decreased urine volume. Negative for difficulty urinating, dysuria, flank pain, frequency, hematuria, pelvic pain, urgency and vaginal discharge.       History of urinary incontinence. CKD 3.  Musculoskeletal: Positive for arthralgias and gait problem. Negative for back pain, myalgias, neck pain and neck stiffness.       Chronic  bursitis right hip.  Skin: Positive for wound (Multiple skin tears). Negative for color change, pallor and rash.       Multiple ecchymoses. Healing ulcer of the right greater trochanter.  Allergic/Immunologic: Negative.  Negative for environmental allergies.  Neurological: Negative for dizziness, tremors, seizures, syncope, weakness, numbness and headaches.       Repetitive  Hematological: Negative for adenopathy. Does not bruise/bleed easily.       Acute blood loss anemia in June 2017 has resolved.  Psychiatric/Behavioral: Negative for agitation, behavioral problems, confusion, dysphoric mood, hallucinations, sleep disturbance and suicidal ideas. The patient is nervous/anxious. The patient is not hyperactive.     Immunization History  Administered Date(s) Administered  . DTaP 05/13/2005  . Influenza-Unspecified 02/11/2015, 02/22/2016  . Pneumococcal Conjugate-13 05/13/2014  . Pneumococcal Polysaccharide-23 05/13/2014  . Zoster 05/13/2014   Pertinent  Health Maintenance Due  Topic Date Due  . FOOT EXAM  09/12/1935  . OPHTHALMOLOGY EXAM  09/12/1935  . URINE MICROALBUMIN  09/12/1935  . PNA vac Low Risk Adult (2 of 2 - PPSV23) 05/14/2015  . HEMOGLOBIN A1C  09/15/2016  . INFLUENZA VACCINE  Completed  . DEXA SCAN  Completed   Fall Risk  11/03/2015  Falls in the past year? No   Functional Status Survey:    Vitals:   04/23/16 1258  BP: 108/66  Pulse: 70  Resp: 19  Temp: 98.2 F (36.8 C)  SpO2: 98%  Weight: 147 lb 3.2 oz (66.8 kg)  Height: 5\' 3"  (1.6 m)   Body mass index is 26.08 kg/m. Physical Exam  Constitutional: She is oriented to person, place, and time. She appears well-developed and well-nourished. No distress.  Elderly and fragile.  HENT:  Right Ear: External ear normal.  Left Ear: External ear normal.  Nose: Nose normal.  Mouth/Throat: Oropharynx is clear and moist. No oropharyngeal exudate.  Eyes: Conjunctivae and EOM are normal. Pupils are equal, round, and  reactive to light. No scleral icterus.  Neck: No JVD present. No tracheal deviation present. No thyromegaly present.  Cardiovascular: Normal rate, regular rhythm and normal heart sounds.  Exam reveals no gallop and no friction rub.   No murmur heard. Unable to feel DP and PT bilaterally due to edema.  Pulmonary/Chest: Effort normal. No respiratory distress. She has no wheezes. She has no rales. She exhibits no tenderness.  Abdominal: She exhibits no distension and no mass. There is no tenderness.  Musculoskeletal: Normal range of motion. She exhibits edema (3+ bipedal from the knee to the foot). She exhibits no tenderness.  Tender at the right hip and down the lateral right leg. Using walker with 2 front wheels and rear skids. Very unstable with walking.  Lymphadenopathy:    She has no cervical adenopathy.  Neurological: She is alert and oriented to person, place, and time. No cranial nerve deficit. Coordination abnormal.  Skin: No rash noted. She is not diaphoretic. No erythema. No pallor.  Ulceration of the right greater trochanter. Approximately 3 mm in diameter. Some oozing.  Psychiatric: She has a normal mood and affect. Her behavior is normal. Judgment and thought content normal.    Labs reviewed:  Recent Labs  11/03/15 2054 11/04/15 0452 11/06/15 0429 12/21/15 12/28/15 03/28/16  NA 139 140 142 143 140 141  K 4.1 3.8 4.0 5.1 4.5 4.1  CL 104 108 112*  --   --   --   CO2 25 25 21*  --   --   --   GLUCOSE 113* 87 241*  --   --   --   BUN 55* 54* 47* 40* 42* 52*  CREATININE 1.62* 1.46* 1.32* 1.3* 1.2* 1.2*  CALCIUM 10.0 9.0 8.5*  --   --   --     Recent Labs  11/03/15 1226 11/03/15 2054  AST 20 25  ALT 18 21  ALKPHOS 44 43  BILITOT 0.8 1.2  PROT 6.0* 5.7*  ALBUMIN 3.6 3.4*    Recent Labs  11/03/15 2054  11/05/15 0225 11/05/15 0453 11/06/15 0429 03/18/16  WBC 11.4*  < > 8.5 10.1 10.5 9.1  NEUTROABS 8.7*  --   --   --   --   --   HGB 12.4  < > 9.9* 10.1* 11.0*  12.2  HCT 36.8  < > 30.1* 30.9* 33.5* 39  MCV 85.2  < > 87.2 88.0 86.8  --   PLT 133*  < > 125* 115* 139* 248  < > =  values in this interval not displayed. Lab Results  Component Value Date   TSH 1.32 12/21/2015   Lab Results  Component Value Date   HGBA1C 7.8 03/18/2016   No results found for: CHOL, HDL, LDLCALC, LDLDIRECT, TRIG, CHOLHDL  Significant Diagnostic Results in last 30 days:  No results found.  Assessment/Plan Chronic radicular pain of lower back Adding Tylenol 500mg  bid, continue Gabapentin 100mg  bid, observe. X-ray lumbar spine, R hip. May consider Ortho consult if no better.   Essential hypertension Controlled, continue Furosemide and Spironolactone. 03/28/16 Na 141, K 4.1, Bun 52, creat 1.22    Chronic diastolic congestive heart failure (HCC) Compensated clinically, continue Furosemide 20mg , Spironolactone 25mg . 12/21/15 BNP 1234.4   DM (diabetes mellitus), type 2, uncontrolled, with renal complications ([place]) [id number] Metformin 500mg  bid. 03/18/16 Hgb 7.8   CKD (chronic kidney disease), stage III 03/28/16 Bun 52, creat 1.22  Edema Improved, 1+ BLE, continue Furosemide and Spironolactone.      Family/ staff Communication: AL  Labs/tests ordered:  X-ray lumbar spine, R hip

## 2016-04-23 NOTE — Assessment & Plan Note (Signed)
Controlled, continue Furosemide and Spironolactone. 03/28/16 Na 141, K 4.1, Bun 52, creat 1.22

## 2016-04-24 DIAGNOSIS — M25551 Pain in right hip: Secondary | ICD-10-CM | POA: Diagnosis not present

## 2016-04-24 DIAGNOSIS — M545 Low back pain: Secondary | ICD-10-CM | POA: Diagnosis not present

## 2016-05-02 ENCOUNTER — Other Ambulatory Visit: Payer: Self-pay | Admitting: *Deleted

## 2016-05-02 MED ORDER — TRAMADOL HCL 50 MG PO TABS: ORAL_TABLET | ORAL | 0 refills | Status: DC

## 2016-05-02 NOTE — Telephone Encounter (Signed)
[facility] [phone] [facility]

## 2016-05-14 ENCOUNTER — Non-Acute Institutional Stay: Payer: Medicare Other | Admitting: Nurse Practitioner

## 2016-05-14 ENCOUNTER — Encounter: Payer: Self-pay | Admitting: Internal Medicine

## 2016-05-14 ENCOUNTER — Encounter: Payer: Self-pay | Admitting: Nurse Practitioner

## 2016-05-14 DIAGNOSIS — R609 Edema, unspecified: Secondary | ICD-10-CM | POA: Diagnosis not present

## 2016-05-14 DIAGNOSIS — I5032 Chronic diastolic (congestive) heart failure: Secondary | ICD-10-CM

## 2016-05-14 DIAGNOSIS — E1165 Type 2 diabetes mellitus with hyperglycemia: Secondary | ICD-10-CM

## 2016-05-14 DIAGNOSIS — M5416 Radiculopathy, lumbar region: Secondary | ICD-10-CM

## 2016-05-14 DIAGNOSIS — I10 Essential (primary) hypertension: Secondary | ICD-10-CM

## 2016-05-14 DIAGNOSIS — E1121 Type 2 diabetes mellitus with diabetic nephropathy: Secondary | ICD-10-CM | POA: Diagnosis not present

## 2016-05-14 DIAGNOSIS — N183 Chronic kidney disease, stage 3 unspecified: Secondary | ICD-10-CM

## 2016-05-14 DIAGNOSIS — G8929 Other chronic pain: Secondary | ICD-10-CM

## 2016-05-14 DIAGNOSIS — IMO0002 Reserved for concepts with insufficient information to code with codable children: Secondary | ICD-10-CM

## 2016-05-14 NOTE — Assessment & Plan Note (Signed)
03/28/16 Bun 52, creat 1.22

## 2016-05-14 NOTE — Assessment & Plan Note (Signed)
Compensated clinically, continue Furosemide 20mg, Spironolactone 25mg. 12/21/15 BNP 1234.4 

## 2016-05-14 NOTE — Progress Notes (Signed)
Location:  [facility] Room Number: 23 Place of Service:  SNF (31) Provider: Antonio Woodhams, Manxie    NP  Jeanmarie Hubert, MD  Patient Care Team: Estill Dooms, MD as PCP - General (Internal Medicine) Jaidon Sponsel Otho Darner, NP as Nurse Practitioner (Internal Medicine)  Extended Emergency Contact Information Primary Emergency Contact: Zeman,Leonard Address: [address] [place] [address] Johnnette Litter of [place] Phone: [phone] Work Phone: [phone] Mobile Phone: [phone] Relation: Son Secondary Emergency Contact: Gerarda Fraction [place] of [place] Phone: [phone] Relation: Friend  Code Status:  DNR Goals of care: Advanced Directive information Advanced Directives 05/14/2016  Does Patient Have a Medical Advance Directive? Yes  Type of Advance Directive Living will;Out of facility DNR (pink MOST or yellow form);Healthcare Power of Attorney  Does patient want to make changes to medical advance directive? No - Patient declined  Copy of [place] in Chart? Yes  Would patient like information on creating a medical advance directive? -  Pre-existing out of facility DNR order (yellow form or pink MOST form) Yellow form placed in chart (order not valid for inpatient use)     Chief Complaint  Patient presents with  . Acute Visit    HPI:  Pt is a 83 y.o. female seen today for an acute visit for persisted, uncontrolled lower back pain, radiating to legs, worsens with movement. Gabapentin and Norco are not adequate presently.     Hx of BLE edema, improved on Furosemide 89m, Spironolactone 261mdaily. 12/16/15 Hgb a1c 9.5, 03/18/16 Hgb a1c 7.8, Metformin 50013mid. Hx of DVT LLE, taking Eliquis 5mg62mily. Pressure ulcer @ right hip, healed.   Past Medical History:  Diagnosis Date  . Acute blood loss anemia   . Allergy   . Asthma   . AVM (arteriovenous malformation) of colon with hemorrhage   . Bursitis of  right hip   . Cataract   . CKD (chronic kidney disease), stage III 11/04/2015  . COLONIC POLYPS 03/26/2005   Qualifier: Diagnosis of  By: McMuTalbert Cage ([place]    . DIVERTICULOSIS, COLON 03/26/2005   Qualifier: Diagnosis of  By: McMu[place]bert Cage ([place]AM[place]    . DVT (deep venous thrombosis), left 11/03/2015  . Ecchymosis 12/16/2015  . Hyperglycemia 12/16/2015  . Hyperlipidemia   . Hypertension   . Multiple skin tears 12/16/2015  . Osteopenia   . Stasis edema of both lower extremities 11/04/2015  . Unstable gait 12/16/2015  . Urine incontinence 12/16/2015   Past Surgical History:  Procedure Laterality Date  . ABDOMINAL HYSTERECTOMY  2006   Dr. Wes Maryelizabeth RowanCATARACT EXTRACTION W/ INTRAOCULAR LENS IMPLANT Bilateral 1999/2003   Dr. MarkRutherford GuysCOLONOSCOPY N/A 11/06/2015   Procedure: COLONOSCOPY;  Surgeon: KaviMauri Pole;  Location: [facility];  Service: Endoscopy;  Laterality: N/A;  MAC if available, otherwise moderate sedation    Allergies  Allergen Reactions  . Epinephrine Nausea Only    Unknown.  . HyMarland Kitchenrocodone-Acetaminophen     REACTION: nausea  . Labetalol   . Procaine     Allergies as of 05/14/2016      Reactions   Epinephrine Nausea Only   Unknown.   Hydrocodone-acetaminophen    REACTION: nausea   Labetalol    Procaine       Medication List       Accurate as of 05/14/16  4:21 PM. Always use your  most recent med list.          acetaminophen 500 MG tablet Commonly known as:  TYLENOL Take 500 mg by mouth every 6 (six) hours as needed.   calcium carbonate 500 MG chewable tablet Commonly known as:  TUMS - dosed in mg elemental calcium Chew 2 tablets by mouth daily.   ELIQUIS 5 MG Tabs tablet Generic drug:  apixaban Take 5 mg by mouth. Take one tablet twice daily   feeding supplement (PRO-STAT SUGAR FREE 64) Liqd Take 30 mLs by mouth. Take 30 ml once daily   furosemide 20 MG tablet Commonly known as:  LASIX Take 1 tablet (20 mg total) by mouth  daily.   gabapentin 100 MG capsule Commonly known as:  NEURONTIN Take 100 mg by mouth 2 (two) times daily.   HYDROcodone-acetaminophen 5-325 MG tablet Commonly known as:  NORCO/VICODIN Take 0.5 tablets by mouth 2 (two) times daily.   ICAPS MV Tabs Take 1 tablet by mouth daily.   multivitamin with minerals tablet Take 1 tablet by mouth. Take one tablet once daily   metFORMIN 500 MG tablet Commonly known as:  GLUCOPHAGE Take 500 mg by mouth 2 (two) times daily with a meal.   predniSONE 5 MG tablet Commonly known as:  DELTASONE Take 5 mg by mouth daily with breakfast.   RESOURCE 2.0 Liqd Take by mouth. Take 120 ml twice a day between meals to promote wound healing   simvastatin 10 MG tablet Commonly known as:  ZOCOR Take 10 mg by mouth daily.       Review of Systems  Constitutional: Positive for activity change and fatigue. Negative for appetite change, chills, diaphoresis, fever and unexpected weight change.  HENT: Negative for congestion, ear discharge, ear pain, hearing loss, nosebleeds, postnasal drip, rhinorrhea, sore throat, tinnitus, trouble swallowing and voice change.   Eyes: Negative for pain, discharge, redness, itching and visual disturbance.  Respiratory: Negative for cough, choking, shortness of breath and wheezing.   Cardiovascular: Positive for leg swelling (3+ bilateral lower legs). Negative for chest pain and palpitations.  Gastrointestinal: Positive for constipation. Negative for abdominal distention, abdominal pain, blood in stool, diarrhea, nausea and vomiting.       Diverticulosis coli. AV malformations of the colon. Internal hemorrhoids. History of GI bleed. History of decubitus ulceration in the perineal area. Last colonoscopy 11/06/2015 by Dr. Silverio Decamp.  Endocrine: Negative for cold intolerance, heat intolerance, polydipsia, polyphagia and polyuria.  Genitourinary: Positive for decreased urine volume. Negative for difficulty urinating, dysuria, flank  pain, frequency, hematuria, pelvic pain, urgency and vaginal discharge.       History of urinary incontinence. CKD 3.  Musculoskeletal: Positive for arthralgias and gait problem. Negative for back pain, myalgias, neck pain and neck stiffness.       Chronic bursitis right hip.  Skin: Positive for wound (Multiple skin tears). Negative for color change, pallor and rash.       Multiple ecchymoses. Healing ulcer of the right greater trochanter.  Allergic/Immunologic: Negative.  Negative for environmental allergies.  Neurological: Negative for dizziness, tremors, seizures, syncope, weakness, numbness and headaches.       Repetitive  Hematological: Negative for adenopathy. Does not bruise/bleed easily.       Acute blood loss anemia in June 2017 has resolved.  Psychiatric/Behavioral: Negative for agitation, behavioral problems, confusion, dysphoric mood, hallucinations, sleep disturbance and suicidal ideas. The patient is nervous/anxious. The patient is not hyperactive.     Immunization History  Administered Date(s) Administered  .  DTaP 05/13/2005  . Influenza-Unspecified 02/11/2015, 02/22/2016  . Pneumococcal Conjugate-13 05/13/2014  . Pneumococcal Polysaccharide-23 05/13/2014  . Zoster 05/13/2014   Pertinent  Health Maintenance Due  Topic Date Due  . FOOT EXAM  09/12/1935  . OPHTHALMOLOGY EXAM  09/12/1935  . URINE MICROALBUMIN  09/12/1935  . PNA vac Low Risk Adult (2 of 2 - PPSV23) 05/14/2015  . HEMOGLOBIN A1C  09/15/2016  . INFLUENZA VACCINE  Completed  . DEXA SCAN  Completed   Fall Risk  11/03/2015  Falls in the past year? No   Functional Status Survey:    Vitals:   05/14/16 1600  BP: 132/66  Pulse: 83  Resp: (!) 24  Temp: 98 F (36.7 C)  SpO2: 95%  Weight: 142 lb 9.6 oz (64.7 kg)  Height: _0  (1.6 m)   Body mass index is 25.26 kg/m. Physical Exam  Constitutional: She is oriented to person, place, and time. She appears well-developed and well-nourished. No distress.   Elderly and fragile.  HENT:  Right Ear: External ear normal.  Left Ear: External ear normal.  Nose: Nose normal.  Mouth/Throat: Oropharynx is clear and moist. No oropharyngeal exudate.  Eyes: Conjunctivae and EOM are normal. Pupils are equal, round, and reactive to light. No scleral icterus.  Neck: No JVD present. No tracheal deviation present. No thyromegaly present.  Cardiovascular: Normal rate, regular rhythm and normal heart sounds.  Exam reveals no gallop and no friction rub.   No murmur heard. Unable to feel DP and PT bilaterally due to edema.  Pulmonary/Chest: Effort normal. No respiratory distress. She has no wheezes. She has no rales. She exhibits no tenderness.  Abdominal: She exhibits no distension and no mass. There is no tenderness.  Musculoskeletal: Normal range of motion. She exhibits edema (3+ bipedal from the knee to the foot). She exhibits no tenderness.  Tender at the right hip and down the lateral right leg. Using walker with 2 front wheels and rear skids. Very unstable with walking.  Lymphadenopathy:    She has no cervical adenopathy.  Neurological: She is alert and oriented to person, place, and time. No cranial nerve deficit. Coordination abnormal.  Skin: No rash noted. She is not diaphoretic. No erythema. No pallor.  Ulceration of the right greater trochanter. Approximately 3 mm in diameter. Some oozing.  Psychiatric: She has a normal mood and affect. Her behavior is normal. Judgment and thought content normal.    Labs reviewed:  Recent Labs  11/03/15 2054 11/04/15 0452 11/06/15 0429 12/21/15 12/28/15 03/28/16  NA 139 140 142 143 140 141  K 4.1 3.8 4.0 5.1 4.5 4.1  CL 104 108 112*  --   --   --   CO2 25 25 21*  --   --   --   GLUCOSE 113* 87 241*  --   --   --   BUN 55* 54* 47* 40* 42* 52*  CREATININE 1.62* 1.46* 1.32* 1.3* 1.2* 1.2*  CALCIUM 10.0 9.0 8.5*  --   --   --     Recent Labs  11/03/15 1226 11/03/15 2054  AST 20 25  ALT 18 21  ALKPHOS  44 43  BILITOT 0.8 1.2  PROT 6.0* 5.7*  ALBUMIN 3.6 3.4*    Recent Labs  11/03/15 2054  11/05/15 0225 11/05/15 0453 11/06/15 0429 03/18/16  WBC 11.4*  < > 8.5 10.1 10.5 9.1  NEUTROABS 8.7*  --   --   --   --   --  HGB 12.4  < > 9.9* 10.1* 11.0* 12.2  HCT 36.8  < > 30.1* 30.9* 33.5* 39  MCV 85.2  < > 87.2 88.0 86.8  --   PLT 133*  < > 125* 115* 139* 248  < > = values in this interval not displayed. Lab Results  Component Value Date   TSH 1.32 12/21/2015   Lab Results  Component Value Date   HGBA1C 7.8 03/18/2016   No results found for: CHOL, HDL, LDLCALC, LDLDIRECT, TRIG, CHOLHDL  Significant Diagnostic Results in last 30 days:  No results found.  Assessment/Plan Chronic radicular pain of lower back Not well controlled, increase Gabapentin 340m bid, continue Norco 5/3260m1/2 bid. Observe.   Essential hypertension Controlled, continue Furosemide and Spironolactone. 03/28/16 Na 141, K 4.1, Bun 52, creat 1.22    Chronic diastolic congestive heart failure (HCC) Compensated clinically, continue Furosemide 2028mSpironolactone 95m46m/10/17 BNP 1234.4   DM (diabetes mellitus), type 2, uncontrolled, with renal complications (HCC)Galliano8/4/8/25formin 500mg51m. 03/18/16 Hgb 7.8   CKD (chronic kidney disease), stage III 03/28/16 Bun 52, creat 1.22  Edema Improved, 1+ BLE, continue Furosemide and Spironolactone.     Family/ staff Communication: AL  Labs/tests ordered:  none

## 2016-05-14 NOTE — Assessment & Plan Note (Signed)
Not well controlled, increase Gabapentin 300mg  bid, continue Norco 5/325mg  1/2 bid. Observe.

## 2016-05-14 NOTE — Assessment & Plan Note (Signed)
12/19/15 Metformin 500mg bid. 03/18/16 Hgb 7.8 

## 2016-05-14 NOTE — Assessment & Plan Note (Signed)
Improved, 1+ BLE, continue Furosemide and Spironolactone.  

## 2016-05-14 NOTE — Assessment & Plan Note (Signed)
Controlled, continue Furosemide and Spironolactone. 03/28/16 Na 141, K 4.1, Bun 52, creat 1.22

## 2016-05-21 ENCOUNTER — Encounter: Payer: Self-pay | Admitting: Internal Medicine

## 2016-05-21 ENCOUNTER — Non-Acute Institutional Stay: Payer: Medicare Other | Admitting: Internal Medicine

## 2016-05-21 VITALS — BP 120/58 | HR 80 | Temp 97.7°F | Ht 63.0 in | Wt 144.6 lb

## 2016-05-21 DIAGNOSIS — L89213 Pressure ulcer of right hip, stage 3: Secondary | ICD-10-CM | POA: Diagnosis not present

## 2016-05-21 DIAGNOSIS — E1121 Type 2 diabetes mellitus with diabetic nephropathy: Secondary | ICD-10-CM | POA: Diagnosis not present

## 2016-05-21 DIAGNOSIS — I10 Essential (primary) hypertension: Secondary | ICD-10-CM | POA: Diagnosis not present

## 2016-05-21 DIAGNOSIS — N183 Chronic kidney disease, stage 3 unspecified: Secondary | ICD-10-CM

## 2016-05-21 DIAGNOSIS — E1165 Type 2 diabetes mellitus with hyperglycemia: Secondary | ICD-10-CM

## 2016-05-21 DIAGNOSIS — I5032 Chronic diastolic (congestive) heart failure: Secondary | ICD-10-CM

## 2016-05-21 DIAGNOSIS — R2681 Unsteadiness on feet: Secondary | ICD-10-CM | POA: Diagnosis not present

## 2016-05-21 DIAGNOSIS — R609 Edema, unspecified: Secondary | ICD-10-CM | POA: Diagnosis not present

## 2016-05-21 DIAGNOSIS — IMO0002 Reserved for concepts with insufficient information to code with codable children: Secondary | ICD-10-CM

## 2016-05-21 NOTE — Progress Notes (Signed)
[facility] Room Number: AL 23  Place of Service: Clinic (12)     Allergies  Allergen Reactions  . Epinephrine Nausea Only    Unknown.  Marland Kitchen Hydrocodone-Acetaminophen     REACTION: nausea  . Labetalol   . Procaine     Chief Complaint  Patient presents with  . Medical Management of Chronic Issues    3 month medication management blood pressure, blood sugar, low back pain.     HPI:  Was not feeling well when she woke up this morning. Nothing specific.   Has been having shooting pains down the legs about 4 times daily. Was started on a higher dose of gabapentin last week . Seems to be tolerating current dose.   Edema has improved. Wearing light weight compression stockings. Using furosemide and spironolactone.  Feels very stiff and wobbly when she fisrst gets up.   BP controlled.  Wound of the buttock and leg is healed.  DM is controlled. Using metformin.  Medications: Patient's Medications  New Prescriptions   No medications on file  Previous Medications   ACETAMINOPHEN (TYLENOL) 500 MG TABLET    Take 500 mg by mouth. Take one twice a day   AMINO ACIDS-PROTEIN HYDROLYS (FEEDING SUPPLEMENT, PRO-STAT SUGAR FREE 64,) LIQD    Take 30 mLs by mouth. Take 30 ml once daily   APIXABAN (ELIQUIS) 5 MG TABS TABLET    Take 5 mg by mouth. Take one tablet twice daily   CALCIUM CARBONATE (TUMS - DOSED IN MG ELEMENTAL CALCIUM) 500 MG CHEWABLE TABLET    Chew 2 tablets by mouth daily.   FUROSEMIDE (LASIX) 20 MG TABLET    Take 1 tablet (20 mg total) by mouth daily.   GABAPENTIN (NEURONTIN) 100 MG CAPSULE    Take 100 mg by mouth. Take 3 (379m) twice daily   METFORMIN (GLUCOPHAGE) 500 MG TABLET    Take 500 mg by mouth 2 (two) times daily with a meal.   MULTIPLE VITAMINS-MINERALS (ICAPS MV) TABS    Take 1 tablet by mouth daily.   MULTIPLE VITAMINS-MINERALS (MULTIVITAMIN WITH MINERALS) TABLET    Take 1 tablet by mouth. Take one tablet once daily   NUTRITIONAL SUPPLEMENTS  (RESOURCE 2.0) LIQD    Take by mouth. Take 120 ml twice a day between meals to promote wound healing   PREDNISONE (DELTASONE) 5 MG TABLET    Take 5 mg by mouth daily with breakfast.   SIMVASTATIN (ZOCOR) 10 MG TABLET    Take 10 mg by mouth daily.   SPIRONOLACTONE (ALDACTONE) 25 MG TABLET    Take 25 mg by mouth. Take one tablet daily  Modified Medications   No medications on file  Discontinued Medications   HYDROCODONE-ACETAMINOPHEN (NORCO/VICODIN) 5-325 MG TABLET    Take 0.5 tablets by mouth 2 (two) times daily.     Review of Systems  Constitutional: Positive for activity change and fatigue. Negative for appetite change, chills, diaphoresis, fever and unexpected weight change.  HENT: Negative for congestion, ear discharge, ear pain, hearing loss, nosebleeds, postnasal drip, rhinorrhea, sore throat, tinnitus, trouble swallowing and voice change.   Eyes: Negative for pain, discharge, redness, itching and visual disturbance.  Respiratory: Negative for cough, choking, shortness of breath and wheezing.   Cardiovascular: Positive for leg swelling (3+ bilateral lower legs). Negative for chest pain and palpitations.  Gastrointestinal: Positive for constipation. Negative for abdominal distention, abdominal pain, blood in stool, diarrhea, nausea and vomiting.  Diverticulosis coli. AV malformations of the colon. Internal hemorrhoids. History of GI bleed. History of decubitus ulceration in the perineal area. Last colonoscopy 11/06/2015 by Dr. Silverio Decamp.  Endocrine: Negative for cold intolerance, heat intolerance, polydipsia, polyphagia and polyuria.  Genitourinary: Positive for decreased urine volume. Negative for difficulty urinating, dysuria, flank pain, frequency, hematuria, pelvic pain, urgency and vaginal discharge.       History of urinary incontinence. CKD 3.  Musculoskeletal: Positive for arthralgias and gait problem. Negative for back pain, myalgias, neck pain and neck stiffness.        Chronic bursitis right hip.  Skin: Positive for wound (Multiple skin tears). Negative for color change, pallor and rash.       Multiple ecchymoses. Healed ulcer of the right greater trochanter.  Allergic/Immunologic: Negative.  Negative for environmental allergies.  Neurological: Negative for dizziness, tremors, seizures, syncope, weakness, numbness and headaches.       Repetitive. Shooting pains down legs  Hematological: Negative for adenopathy. Does not bruise/bleed easily.       Acute blood loss anemia in June 2017 has resolved.  Psychiatric/Behavioral: Negative for agitation, behavioral problems, confusion, dysphoric mood, hallucinations, sleep disturbance and suicidal ideas. The patient is nervous/anxious. The patient is not hyperactive.     Vitals:   05/21/16 0937  BP: (!) 120/58  Pulse: 80  Temp: 97.7 F (36.5 C)  TempSrc: Oral  SpO2: 95%  Weight: 144 lb 9.6 oz (65.6 kg)  Height: '5\' 3"'  (1.6 m)   Wt Readings from Last 3 Encounters:  05/21/16 144 lb 9.6 oz (65.6 kg)  05/14/16 142 lb 9.6 oz (64.7 kg)  04/23/16 147 lb 3.2 oz (66.8 kg)    Body mass index is 25.61 kg/m.  Physical Exam  Constitutional: She is oriented to person, place, and time. She appears well-developed and well-nourished. No distress.  Elderly and fragile.  HENT:  Right Ear: External ear normal.  Left Ear: External ear normal.  Nose: Nose normal.  Mouth/Throat: Oropharynx is clear and moist. No oropharyngeal exudate.  Eyes: Conjunctivae and EOM are normal. Pupils are equal, round, and reactive to light. No scleral icterus.  Neck: No JVD present. No tracheal deviation present. No thyromegaly present.  Cardiovascular: Normal rate, regular rhythm and normal heart sounds.  Exam reveals no gallop and no friction rub.   No murmur heard. Unable to feel DP and PT bilaterally due to edema.  Pulmonary/Chest: Effort normal. No respiratory distress. She has no wheezes. She has no rales. She exhibits no tenderness.   Abdominal: She exhibits no distension and no mass. There is no tenderness.  Musculoskeletal: Normal range of motion. She exhibits edema (2+ bipedal from the knee to the foot). She exhibits no tenderness.  Tender at the right hip and down the lateral right leg. Using walker with 2 front wheels and rear skids. Very unstable with walking.  Lymphadenopathy:    She has no cervical adenopathy.  Neurological: She is alert and oriented to person, place, and time. No cranial nerve deficit. Coordination abnormal.  Skin: No rash noted. She is not diaphoretic. No erythema. No pallor.  Ulceration of the right greater trochanter. Approximately 3 mm in diameter. Some oozing.  Psychiatric: She has a normal mood and affect. Her behavior is normal. Judgment and thought content normal.     Labs reviewed: Lab Summary Latest Ref Rng & Units 03/28/2016 03/18/2016 12/28/2015 12/21/2015 11/06/2015 11/05/2015 11/05/2015  Hemoglobin 12.0 - 16.0 g/dL (None) 12.2 (None) (None) 11.0(L) 10.1(L) 9.9(L)  Hematocrit 36 -  46 % (None) 39 (None) (None) 33.5(L) 30.9(L) 30.1(L)  White count 10:3/mL (None) 9.1 (None) (None) 10.5 10.1 8.5  Platelet count 150 - 399 K/L (None) 248 (None) (None) 139(L) 115(L) 125(L)  Sodium 137 - 147 mmol/L 141 (None) 140 143 142 (None) (None)  Potassium 3.4 - 5.3 mmol/L 4.1 (None) 4.5 5.1 4.0 (None) (None)  Calcium 8.9 - 10.3 mg/dL (None) (None) (None) (None) 8.5(L) (None) (None)  Phosphorus - (None) (None) (None) (None) (None) (None) (None)  Creatinine 0.5 - 1.1 mg/dL 1.2(A) (None) 1.2(A) 1.3(A) 1.32(H) (None) (None)  AST - (None) (None) (None) (None) (None) (None) (None)  Alk Phos - (None) (None) (None) (None) (None) (None) (None)  Bilirubin - (None) (None) (None) (None) (None) (None) (None)  Glucose mg/dL 133 (None) 118 147 241(H) (None) (None)  Cholesterol - (None) (None) (None) (None) (None) (None) (None)  HDL cholesterol - (None) (None) (None) (None) (None) (None) (None)  Triglycerides -  (None) (None) (None) (None) (None) (None) (None)  LDL Direct - (None) (None) (None) (None) (None) (None) (None)  LDL Calc - (None) (None) (None) (None) (None) (None) (None)  Total protein - (None) (None) (None) (None) (None) (None) (None)  Albumin - (None) (None) (None) (None) (None) (None) (None)  Some recent data might be hidden   Lab Results  Component Value Date   TSH 1.32 12/21/2015   Lab Results  Component Value Date   BUN 52 (A) 03/28/2016   BUN 42 (A) 12/28/2015   BUN 40 (A) 12/21/2015   Lab Results  Component Value Date   CREATININE 1.2 (A) 03/28/2016   CREATININE 1.2 (A) 12/28/2015   CREATININE 1.3 (A) 12/21/2015   Lab Results  Component Value Date   HGBA1C 7.8 03/18/2016       Assessment/Plan  1. Decubitus ulcer of right hip, stage 3 (HCC) healed  2. Unstable gait unchanged  3. Edema, unspecified type improved  4. Essential hypertension controlled  5. Uncontrolled type 2 diabetes mellitus with diabetic nephropathy, without long-term current use of insulin ([place]) improved  6. Chronic diastolic congestive heart failure (HCC) stable  7. CKD (chronic kidney disease), stage III stable

## 2016-06-21 ENCOUNTER — Encounter: Payer: Self-pay | Admitting: Nurse Practitioner

## 2016-06-21 ENCOUNTER — Non-Acute Institutional Stay: Payer: Medicare Other | Admitting: Nurse Practitioner

## 2016-06-21 DIAGNOSIS — M7061 Trochanteric bursitis, right hip: Secondary | ICD-10-CM

## 2016-06-21 DIAGNOSIS — I5032 Chronic diastolic (congestive) heart failure: Secondary | ICD-10-CM | POA: Diagnosis not present

## 2016-06-21 DIAGNOSIS — R609 Edema, unspecified: Secondary | ICD-10-CM

## 2016-06-21 DIAGNOSIS — I10 Essential (primary) hypertension: Secondary | ICD-10-CM | POA: Diagnosis not present

## 2016-06-21 DIAGNOSIS — E1121 Type 2 diabetes mellitus with diabetic nephropathy: Secondary | ICD-10-CM | POA: Diagnosis not present

## 2016-06-21 DIAGNOSIS — N183 Chronic kidney disease, stage 3 unspecified: Secondary | ICD-10-CM

## 2016-06-21 DIAGNOSIS — E1165 Type 2 diabetes mellitus with hyperglycemia: Secondary | ICD-10-CM | POA: Diagnosis not present

## 2016-06-21 DIAGNOSIS — IMO0002 Reserved for concepts with insufficient information to code with codable children: Secondary | ICD-10-CM

## 2016-06-21 NOTE — Assessment & Plan Note (Signed)
Hx, continue Eliquis.  

## 2016-06-21 NOTE — Assessment & Plan Note (Signed)
Improved, 1+ BLE, continue Furosemide and Spironolactone.  

## 2016-06-21 NOTE — Assessment & Plan Note (Signed)
Controlled, continue Furosemide and Spironolactone. 03/28/16 Na 141, K 4.1, Bun 52, creat 1.22

## 2016-06-21 NOTE — Assessment & Plan Note (Signed)
Not well controlled, continue Gabapentin 300mg  bid, increase Norco 5/325mg  1/2 to tid Observe.

## 2016-06-21 NOTE — Assessment & Plan Note (Signed)
Compensated clinically, continue Furosemide 20mg, Spironolactone 25mg. 12/21/15 BNP 1234.4 

## 2016-06-21 NOTE — Assessment & Plan Note (Signed)
Last Bun 52, creat 1.22 03/28/16

## 2016-06-21 NOTE — Assessment & Plan Note (Signed)
12/19/15 Metformin 500mg bid. 03/18/16 Hgb 7.8 

## 2016-06-21 NOTE — Progress Notes (Signed)
Location:  [facility] Room Number: 23 Place of Service:  ALF [phone]) Provider:  Virda Betters,Manxie  NP  Jeanmarie Hubert, MD  Patient Care Team: Estill Dooms, MD as PCP - General (Internal Medicine) Arnika Larzelere Otho Darner, NP as Nurse Practitioner (Internal Medicine) Rutherford Guys, MD as Consulting Physician (Ophthalmology)  Extended Emergency Contact Information Primary Emergency Contact: Cosby,Leonard Address: [address] [place] [address] Johnnette Litter of [place] Phone: [phone] Work Phone: [phone] Mobile Phone: [phone] Relation: Son Secondary Emergency Contact: Gerarda Fraction [place] of [place] Phone: [phone] Relation: Friend  Code Status:  DNR Goals of care: Advanced Directive information Advanced Directives 06/21/2016  Does Patient Have a Medical Advance Directive? Yes  Type of Paramedic of [place];Living will;Out of facility DNR (pink MOST or yellow form)  Does patient want to make changes to medical advance directive? No - Patient declined  Copy of [place] in Chart? Yes  Would patient like information on creating a medical advance directive? -  Pre-existing out of facility DNR order (yellow form or pink MOST form) Yellow form placed in chart (order not valid for inpatient use)     Chief Complaint  Patient presents with  . Medical Management of Chronic Issues    HPI:  Pt is a 83 y.o. female seen today for medical management of chronic diseases.      Hx lower back pain, radiating to legs, worsens with movement, better with Gabapentin 325m bid, Norco 1/2 tab bid and prn, but still pain still keeps her from getting night sleep and throughout day, Hx of BLE edema, improved on Furosemide 231m Spironolactone 2526maily. 12/16/15 Hgb a1c 9.5, 03/18/16 Hgb a1c 7.8, Metformin 500m104md. Hx of DVT LLE, taking Eliquis 5mg 39mly. Pressure ulcer @ right hip,  healed.  Past Medical History:  Diagnosis Date  . Acute blood loss anemia   . Allergy   . Asthma   . AVM (arteriovenous malformation) of colon with hemorrhage   . Bursitis of right hip   . Cataract   . CKD (chronic kidney disease), stage III 11/04/2015  . COLONIC POLYPS 03/26/2005   Qualifier: Diagnosis of  By: McMurTalbert Cage(AAMA)[place]    . DIVERTICULOSIS, COLON 03/26/2005   Qualifier: Diagnosis of  By: McMurTalbert Cage(AAMA)[place]    . DVT (deep venous thrombosis), left 11/03/2015  . Ecchymosis 12/16/2015  . Hyperglycemia 12/16/2015  . Hyperlipidemia   . Hypertension   . Multiple skin tears 12/16/2015  . Osteopenia   . Stasis edema of both lower extremities 11/04/2015  . Unstable gait 12/16/2015  . Urine incontinence 12/16/2015   Past Surgical History:  Procedure Laterality Date  . ABDOMINAL HYSTERECTOMY  2006   Dr. Wes DMaryelizabeth RowanATARACT EXTRACTION W/ INTRAOCULAR LENS IMPLANT Bilateral 1999/2003   Dr. Mark Rutherford GuysOLONOSCOPY N/A 11/06/2015   Procedure: COLONOSCOPY;  Surgeon: KavitMauri Pole  Location: [facility];  Service: Endoscopy;  Laterality: N/A;  MAC if available, otherwise moderate sedation    Allergies  Allergen Reactions  . Epinephrine Nausea Only    Unknown.  . HydMarland Kitchenocodone-Acetaminophen     REACTION: nausea  . Labetalol   . Procaine     Allergies as of 06/21/2016      Reactions   Epinephrine Nausea Only   Unknown.   Hydrocodone-acetaminophen    REACTION: nausea   Labetalol  Procaine       Medication List       Accurate as of 06/21/16  2:29 PM. Always use your most recent med list.          acetaminophen 500 MG tablet Commonly known as:  TYLENOL Take 500 mg by mouth. Take one twice a day   calcium carbonate 500 MG chewable tablet Commonly known as:  TUMS - dosed in mg elemental calcium Chew 2 tablets by mouth daily.   ELIQUIS 5 MG Tabs tablet Generic drug:  apixaban Take 5 mg by mouth. Take one tablet twice daily   feeding supplement  (PRO-STAT SUGAR FREE 64) Liqd Take 30 mLs by mouth. Take 30 ml once daily   furosemide 20 MG tablet Commonly known as:  LASIX Take 1 tablet (20 mg total) by mouth daily.   gabapentin 100 MG capsule Commonly known as:  NEURONTIN Take 100 mg by mouth. Take 3 (364m) twice daily   HYDROcodone-acetaminophen 5-325 MG tablet Commonly known as:  NORCO/VICODIN Take by mouth every 6 (six) hours as needed for moderate pain. 1/2 tablet three times daily for pain   ICAPS MV Tabs Take 1 tablet by mouth daily.   multivitamin with minerals tablet Take 1 tablet by mouth. Take one tablet once daily   metFORMIN 500 MG tablet Commonly known as:  GLUCOPHAGE Take 500 mg by mouth 2 (two) times daily with a meal.   predniSONE 5 MG tablet Commonly known as:  DELTASONE Take 5 mg by mouth daily with breakfast.   simvastatin 10 MG tablet Commonly known as:  ZOCOR Take 10 mg by mouth daily.   spironolactone 25 MG tablet Commonly known as:  ALDACTONE Take 25 mg by mouth. Take one tablet daily       Review of Systems  Constitutional: Positive for fatigue. Negative for activity change, appetite change, chills, diaphoresis, fever and unexpected weight change.  HENT: Negative for congestion, ear discharge, ear pain, hearing loss, nosebleeds, postnasal drip, rhinorrhea, sore throat, tinnitus, trouble swallowing and voice change.   Eyes: Negative for pain, discharge, redness, itching and visual disturbance.  Respiratory: Negative for cough, choking, shortness of breath and wheezing.   Cardiovascular: Positive for leg swelling (3+ bilateral lower legs). Negative for chest pain and palpitations.  Gastrointestinal: Positive for constipation. Negative for abdominal distention, abdominal pain, blood in stool, diarrhea, nausea and vomiting.       Diverticulosis coli. AV malformations of the colon. Internal hemorrhoids. History of GI bleed. History of decubitus ulceration in the perineal area. Last colonoscopy  11/06/2015 by Dr. NSilverio Decamp  Endocrine: Negative for cold intolerance, heat intolerance, polydipsia, polyphagia and polyuria.  Genitourinary: Positive for decreased urine volume. Negative for difficulty urinating, dysuria, flank pain, frequency, hematuria, pelvic pain, urgency and vaginal discharge.       History of urinary incontinence. CKD 3.  Musculoskeletal: Positive for arthralgias and gait problem. Negative for back pain, myalgias, neck pain and neck stiffness.       Chronic bursitis right hip.  Skin: Positive for wound (Multiple skin tears). Negative for color change, pallor and rash.       Multiple ecchymoses. Healed ulcer of the right greater trochanter. R elbow skin tear  Allergic/Immunologic: Negative.  Negative for environmental allergies.  Neurological: Negative for dizziness, tremors, seizures, syncope, weakness, numbness and headaches.       Repetitive. Shooting pains down legs  Hematological: Negative for adenopathy. Does not bruise/bleed easily.       Acute blood loss anemia  in June 2017 has resolved.  Psychiatric/Behavioral: Negative for agitation, behavioral problems, confusion, dysphoric mood, hallucinations, sleep disturbance and suicidal ideas. The patient is nervous/anxious. The patient is not hyperactive.     Immunization History  Administered Date(s) Administered  . DTaP 05/13/2005  . Influenza-Unspecified 02/11/2015, 02/22/2016  . Pneumococcal Conjugate-13 05/13/2014  . Pneumococcal Polysaccharide-23 05/13/2014  . Zoster 05/13/2014   Pertinent  Health Maintenance Due  Topic Date Due  . FOOT EXAM  09/12/1935  . OPHTHALMOLOGY EXAM  09/12/1935  . URINE MICROALBUMIN  09/12/1935  . PNA vac Low Risk Adult (2 of 2 - PPSV23) 05/14/2015  . HEMOGLOBIN A1C  09/15/2016  . INFLUENZA VACCINE  Completed  . DEXA SCAN  Completed   Fall Risk  11/03/2015  Falls in the past year? No   Functional Status Survey:    Vitals:   06/21/16 1340  BP: 120/70  Pulse: 79  Resp:  20  Temp: 97.8 F (36.6 C)  SpO2: 97%  Weight: 148 lb (67.1 kg)  Height: _0  (1.6 m)   Body mass index is 26.22 kg/m. Physical Exam  Constitutional: She is oriented to person, place, and time. She appears well-developed and well-nourished. No distress.  Elderly and fragile.  HENT:  Right Ear: External ear normal.  Left Ear: External ear normal.  Nose: Nose normal.  Mouth/Throat: Oropharynx is clear and moist. No oropharyngeal exudate.  Eyes: Conjunctivae and EOM are normal. Pupils are equal, round, and reactive to light. No scleral icterus.  Neck: No JVD present. No tracheal deviation present. No thyromegaly present.  Cardiovascular: Normal rate, regular rhythm and normal heart sounds.  Exam reveals no gallop and no friction rub.   No murmur heard. Unable to feel DP and PT bilaterally due to edema.  Pulmonary/Chest: Effort normal. No respiratory distress. She has no wheezes. She has no rales. She exhibits no tenderness.  Abdominal: She exhibits no distension and no mass. There is no tenderness.  Musculoskeletal: Normal range of motion. She exhibits edema (2+ bipedal from the knee to the foot). She exhibits no tenderness.  Tender at the right hip and down the lateral right leg. Using walker with 2 front wheels and rear skids. Very unstable with walking.  Lymphadenopathy:    She has no cervical adenopathy.  Neurological: She is alert and oriented to person, place, and time. No cranial nerve deficit. Coordination abnormal.  Skin: No rash noted. She is not diaphoretic. No erythema. No pallor.  Ulceration of the right greater trochanter healed. R elbow skin tear, well proximate with steri strips, no s/s of infection.    Psychiatric: She has a normal mood and affect. Her behavior is normal. Judgment and thought content normal.    Labs reviewed:  Recent Labs  11/03/15 2054 11/04/15 0452 11/06/15 0429 12/21/15 12/28/15 03/28/16  NA 139 140 142 143 140 141  K 4.1 3.8 4.0 5.1 4.5  4.1  CL 104 108 112*  --   --   --   CO2 25 25 21*  --   --   --   GLUCOSE 113* 87 241*  --   --   --   BUN 55* 54* 47* 40* 42* 52*  CREATININE 1.62* 1.46* 1.32* 1.3* 1.2* 1.2*  CALCIUM 10.0 9.0 8.5*  --   --   --     Recent Labs  11/03/15 1226 11/03/15 2054  AST 20 25  ALT 18 21  ALKPHOS 44 43  BILITOT 0.8 1.2  PROT 6.0* 5.7*  ALBUMIN 3.6 3.4*    Recent Labs  11/03/15 2054  11/05/15 0225 11/05/15 0453 11/06/15 0429 03/18/16  WBC 11.4*  < > 8.5 10.1 10.5 9.1  NEUTROABS 8.7*  --   --   --   --   --   HGB 12.4  < > 9.9* 10.1* 11.0* 12.2  HCT 36.8  < > 30.1* 30.9* 33.5* 39  MCV 85.2  < > 87.2 88.0 86.8  --   PLT 133*  < > 125* 115* 139* 248  < > = values in this interval not displayed. Lab Results  Component Value Date   TSH 1.32 12/21/2015   Lab Results  Component Value Date   HGBA1C 7.8 03/18/2016   No results found for: CHOL, HDL, LDLCALC, LDLDIRECT, TRIG, CHOLHDL  Significant Diagnostic Results in last 30 days:  No results found.  Assessment/Plan Bursitis of right hip Not well controlled, continue Gabapentin 32m bid, increase Norco 5/3268m1/2 to tid Observe.    DM (diabetes mellitus), type 2, uncontrolled, with renal complications (HCPelham8/4/1/36etformin 50046mid. 03/18/16 Hgb 7.8    Essential hypertension Controlled, continue Furosemide and Spironolactone. 03/28/16 Na 141, K 4.1, Bun 52, creat 1.22     DVT (deep venous thrombosis), left Hx, continue Eliquis.   Chronic diastolic congestive heart failure (HCCBlodgettompensated clinically, continue Furosemide 98m44mpironolactone 25mg25m10/17 BNP 1234.4    CKD (chronic kidney disease), stage III Last Bun 52, creat 1.22 03/28/16  Edema Improved, 1+ BLE, continue Furosemide and Spironolactone.      Family/ staff Communication: AL  Labs/tests ordered: none

## 2016-07-09 ENCOUNTER — Encounter: Payer: Self-pay | Admitting: Nurse Practitioner

## 2016-07-09 ENCOUNTER — Non-Acute Institutional Stay: Payer: Medicare Other | Admitting: Nurse Practitioner

## 2016-07-09 DIAGNOSIS — Q2733 Arteriovenous malformation of digestive system vessel: Secondary | ICD-10-CM

## 2016-07-09 DIAGNOSIS — R609 Edema, unspecified: Secondary | ICD-10-CM | POA: Diagnosis not present

## 2016-07-09 DIAGNOSIS — G8929 Other chronic pain: Secondary | ICD-10-CM

## 2016-07-09 DIAGNOSIS — IMO0002 Reserved for concepts with insufficient information to code with codable children: Secondary | ICD-10-CM

## 2016-07-09 DIAGNOSIS — M7061 Trochanteric bursitis, right hip: Secondary | ICD-10-CM | POA: Diagnosis not present

## 2016-07-09 DIAGNOSIS — K5521 Angiodysplasia of colon with hemorrhage: Secondary | ICD-10-CM

## 2016-07-09 DIAGNOSIS — L89222 Pressure ulcer of left hip, stage 2: Secondary | ICD-10-CM | POA: Diagnosis not present

## 2016-07-09 DIAGNOSIS — I10 Essential (primary) hypertension: Secondary | ICD-10-CM

## 2016-07-09 DIAGNOSIS — I5032 Chronic diastolic (congestive) heart failure: Secondary | ICD-10-CM

## 2016-07-09 DIAGNOSIS — N183 Chronic kidney disease, stage 3 unspecified: Secondary | ICD-10-CM

## 2016-07-09 DIAGNOSIS — E1121 Type 2 diabetes mellitus with diabetic nephropathy: Secondary | ICD-10-CM

## 2016-07-09 DIAGNOSIS — E1165 Type 2 diabetes mellitus with hyperglycemia: Secondary | ICD-10-CM

## 2016-07-09 DIAGNOSIS — M5416 Radiculopathy, lumbar region: Secondary | ICD-10-CM

## 2016-07-09 NOTE — Assessment & Plan Note (Signed)
Improved, 1+ BLE, continue Furosemide and Spironolactone.  

## 2016-07-09 NOTE — Assessment & Plan Note (Signed)
Compensated clinically, continue Furosemide 20mg, Spironolactone 25mg. 12/21/15 BNP 1234.4 

## 2016-07-09 NOTE — Progress Notes (Signed)
Location:  [facility] Room Number: 23 Place of Service:  ALF [phone]) Provider:  Genevieve Ritzel, Manxie  NP  Jeanmarie Hubert, MD  Patient Care Team: Estill Dooms, MD as PCP - General (Internal Medicine) Eryck Negron Otho Darner, NP as Nurse Practitioner (Internal Medicine) Rutherford Guys, MD as Consulting Physician (Ophthalmology)  Extended Emergency Contact Information Primary Emergency Contact: Pensyl,Leonard Address: [address] [place] [address] Johnnette Litter of [place] Phone: [phone] Work Phone: [phone] Mobile Phone: [phone] Relation: Son Secondary Emergency Contact: Gerarda Fraction [place] of [place] Phone: [phone] Relation: Friend  Code Status:  DNR Goals of care: Advanced Directive information Advanced Directives 07/09/2016  Does Patient Have a Medical Advance Directive? Yes  Type of Paramedic of [place];Out of facility DNR (pink MOST or yellow form)  Does patient want to make changes to medical advance directive? No - Patient declined  Copy of [place] in Chart? Yes  Would patient like information on creating a medical advance directive? -  Pre-existing out of facility DNR order (yellow form or pink MOST form) Yellow form placed in chart (order not valid for inpatient use)     Chief Complaint  Patient presents with  . Acute Visit    (L) hip ulcer    HPI:  Pt is a 83 y.o. female seen today for an acute visit for Healed ulcer of the right greater trochanter. Left hip pressure ulcer stage II, not infected.    Hx lower back pain, radiating to legs, worsens with movement, better with Gabapentin 344m bid, Norco 1/2 tab bid and 5/3257mq6h prn, but still pain still keeps her from getting night sleep and throughout day, Hx of BLE edema, improved on Furosemide 207mSpironolactone 25m38mily. 12/16/15 Hgb a1c 9.5, 03/18/16 Hgb a1c 7.8, Metformin 500mg52m. Hx of DVT LLE,  taking Eliquis 5mg d61my. Hgb 9.1 03/18/16. R hip bursitis, able to reduce Prednisone to 5mg 9/82m7 Past Medical History:  Diagnosis Date  . Acute blood loss anemia   . Allergy   . Asthma   . AVM (arteriovenous malformation) of colon with hemorrhage   . Bursitis of right hip   . Cataract   . CKD (chronic kidney disease), stage III 11/04/2015  . COLONIC POLYPS 03/26/2005   Qualifier: Diagnosis of  By: McMurraTalbert CageAMA), [place]    . DIVERTICULOSIS, COLON 03/26/2005   Qualifier: Diagnosis of  By: McMurraTalbert CageAMA), [place]    . DVT (deep venous thrombosis), left 11/03/2015  . Ecchymosis 12/16/2015  . Hyperglycemia 12/16/2015  . Hyperlipidemia   . Hypertension   . Multiple skin tears 12/16/2015  . Osteopenia   . Stasis edema of both lower extremities 11/04/2015  . Unstable gait 12/16/2015  . Urine incontinence 12/16/2015   Past Surgical History:  Procedure Laterality Date  . ABDOMINAL HYSTERECTOMY  2006   Dr. Wes DavMaryelizabeth RowanARACT EXTRACTION W/ INTRAOCULAR LENS IMPLANT Bilateral 1999/2003   Dr. Mark ShRutherford GuysONOSCOPY N/A 11/06/2015   Procedure: COLONOSCOPY;  Surgeon: KavithaMauri PoleLocation: [facility];  Service: Endoscopy;  Laterality: N/A;  MAC if available, otherwise moderate sedation    Allergies  Allergen Reactions  . Epinephrine Nausea Only    Unknown.  . HydroMarland Kitchenodone-Acetaminophen     REACTION: nausea  . Labetalol   . Procaine     Allergies as of 07/09/2016  Reactions   Epinephrine Nausea Only   Unknown.   Hydrocodone-acetaminophen    REACTION: nausea   Labetalol    Procaine       Medication List       Accurate as of 07/09/16  2:37 PM. Always use your most recent med list.          acetaminophen 500 MG tablet Commonly known as:  TYLENOL Take 500 mg by mouth. Take one twice a day   calcium carbonate 500 MG chewable tablet Commonly known as:  TUMS - dosed in mg elemental calcium Chew 2 tablets by mouth daily.   ELIQUIS 5 MG Tabs  tablet Generic drug:  apixaban Take 5 mg by mouth. Take one tablet twice daily   feeding supplement (PRO-STAT SUGAR FREE 64) Liqd Take 30 mLs by mouth. Take 30 ml once daily   furosemide 20 MG tablet Commonly known as:  LASIX Take 1 tablet (20 mg total) by mouth daily.   gabapentin 100 MG capsule Commonly known as:  NEURONTIN Take 100 mg by mouth. Take 3 (372m) twice daily   HYDROcodone-acetaminophen 5-325 MG tablet Commonly known as:  NORCO/VICODIN Take by mouth every 6 (six) hours as needed for moderate pain. 1/2 tablet three times daily for pain   ICAPS MV Tabs Take 1 tablet by mouth daily.   multivitamin with minerals tablet Take 1 tablet by mouth. Take one tablet once daily   metFORMIN 500 MG tablet Commonly known as:  GLUCOPHAGE Take 500 mg by mouth 2 (two) times daily with a meal.   predniSONE 5 MG tablet Commonly known as:  DELTASONE Take 5 mg by mouth daily with breakfast.   simvastatin 10 MG tablet Commonly known as:  ZOCOR Take 10 mg by mouth daily.   spironolactone 25 MG tablet Commonly known as:  ALDACTONE Take 25 mg by mouth. Take one tablet daily       Review of Systems  Constitutional: Positive for fatigue. Negative for activity change, appetite change, chills, diaphoresis, fever and unexpected weight change.  HENT: Negative for congestion, ear discharge, ear pain, hearing loss, nosebleeds, postnasal drip, rhinorrhea, sore throat, tinnitus, trouble swallowing and voice change.   Eyes: Negative for pain, discharge, redness, itching and visual disturbance.  Respiratory: Negative for cough, choking, shortness of breath and wheezing.   Cardiovascular: Positive for leg swelling (3+ bilateral lower legs). Negative for chest pain and palpitations.  Gastrointestinal: Positive for constipation. Negative for abdominal distention, abdominal pain, blood in stool, diarrhea, nausea and vomiting.       Diverticulosis coli. AV malformations of the colon. Internal  hemorrhoids. History of GI bleed. History of decubitus ulceration in the perineal area. Last colonoscopy 11/06/2015 by Dr. NSilverio Decamp  Endocrine: Negative for cold intolerance, heat intolerance, polydipsia, polyphagia and polyuria.  Genitourinary: Positive for decreased urine volume. Negative for difficulty urinating, dysuria, flank pain, frequency, hematuria, pelvic pain, urgency and vaginal discharge.       History of urinary incontinence. CKD 3.  Musculoskeletal: Positive for arthralgias and gait problem. Negative for back pain, myalgias, neck pain and neck stiffness.       Chronic bursitis right hip.  Skin: Positive for wound (Multiple skin tears). Negative for color change, pallor and rash.       Multiple ecchymoses. Healed ulcer of the right greater trochanter. Left hip pressure ulcer stage II, not infected.   Allergic/Immunologic: Negative.  Negative for environmental allergies.  Neurological: Negative for dizziness, tremors, seizures, syncope, weakness, numbness and headaches.  Repetitive. Shooting pains down legs  Hematological: Negative for adenopathy. Does not bruise/bleed easily.       Acute blood loss anemia in June 2017 has resolved.  Psychiatric/Behavioral: Negative for agitation, behavioral problems, confusion, dysphoric mood, hallucinations, sleep disturbance and suicidal ideas. The patient is nervous/anxious. The patient is not hyperactive.     Immunization History  Administered Date(s) Administered  . DTaP 05/13/2005  . Influenza-Unspecified 02/11/2015, 02/22/2016  . Pneumococcal Conjugate-13 05/13/2014  . Pneumococcal Polysaccharide-23 05/13/2014  . Zoster 05/13/2014   Pertinent  Health Maintenance Due  Topic Date Due  . FOOT EXAM  09/12/1935  . OPHTHALMOLOGY EXAM  09/12/1935  . URINE MICROALBUMIN  09/12/1935  . PNA vac Low Risk Adult (2 of 2 - PPSV23) 05/14/2015  . HEMOGLOBIN A1C  09/15/2016  . INFLUENZA VACCINE  Completed  . DEXA SCAN  Completed   Fall  Risk  11/03/2015  Falls in the past year? No   Functional Status Survey:    Vitals:   07/09/16 1401  BP: 106/63  Pulse: 72  Resp: (!) 22  Temp: 98 F (36.7 C)  SpO2: 96%  Weight: 148 lb (67.1 kg)  Height: _0  (1.6 m)   Body mass index is 26.22 kg/m. Physical Exam  Constitutional: She is oriented to person, place, and time. She appears well-developed and well-nourished. No distress.  Elderly and fragile.  HENT:  Right Ear: External ear normal.  Left Ear: External ear normal.  Nose: Nose normal.  Mouth/Throat: Oropharynx is clear and moist. No oropharyngeal exudate.  Eyes: Conjunctivae and EOM are normal. Pupils are equal, round, and reactive to light. No scleral icterus.  Neck: No JVD present. No tracheal deviation present. No thyromegaly present.  Cardiovascular: Normal rate, regular rhythm and normal heart sounds.  Exam reveals no gallop and no friction rub.   No murmur heard. Unable to feel DP and PT bilaterally due to edema.  Pulmonary/Chest: Effort normal. No respiratory distress. She has no wheezes. She has no rales. She exhibits no tenderness.  Abdominal: She exhibits no distension and no mass. There is no tenderness.  Musculoskeletal: Normal range of motion. She exhibits edema (2+ bipedal from the knee to the foot). She exhibits no tenderness.  Tender at the right hip and down the lateral right leg. Using walker with 2 front wheels and rear skids. Very unstable with walking.  Lymphadenopathy:    She has no cervical adenopathy.  Neurological: She is alert and oriented to person, place, and time. No cranial nerve deficit. Coordination abnormal.  Skin: No rash noted. She is not diaphoretic. No erythema. No pallor.  Multiple ecchymoses. Healed ulcer of the right greater trochanter. Left hip pressure ulcer stage II, not infected.   Psychiatric: She has a normal mood and affect. Her behavior is normal. Judgment and thought content normal.    Labs reviewed:  Recent  Labs  11/03/15 2054 11/04/15 0452 11/06/15 0429 12/21/15 12/28/15 03/28/16  NA 139 140 142 143 140 141  K 4.1 3.8 4.0 5.1 4.5 4.1  CL 104 108 112*  --   --   --   CO2 25 25 21*  --   --   --   GLUCOSE 113* 87 241*  --   --   --   BUN 55* 54* 47* 40* 42* 52*  CREATININE 1.62* 1.46* 1.32* 1.3* 1.2* 1.2*  CALCIUM 10.0 9.0 8.5*  --   --   --     Recent Labs  11/03/15 1226 11/03/15  2054  AST 20 25  ALT 18 21  ALKPHOS 44 43  BILITOT 0.8 1.2  PROT 6.0* 5.7*  ALBUMIN 3.6 3.4*    Recent Labs  11/03/15 2054  11/05/15 0225 11/05/15 0453 11/06/15 0429 03/18/16  WBC 11.4*  < > 8.5 10.1 10.5 9.1  NEUTROABS 8.7*  --   --   --   --   --   HGB 12.4  < > 9.9* 10.1* 11.0* 12.2  HCT 36.8  < > 30.1* 30.9* 33.5* 39  MCV 85.2  < > 87.2 88.0 86.8  --   PLT 133*  < > 125* 115* 139* 248  < > = values in this interval not displayed. Lab Results  Component Value Date   TSH 1.32 12/21/2015   Lab Results  Component Value Date   HGBA1C 7.8 03/18/2016   No results found for: CHOL, HDL, LDLCALC, LDLDIRECT, TRIG, CHOLHDL  Significant Diagnostic Results in last 30 days:  No results found.  Assessment/Plan Stage II pressure ulcer of left hip Left hip pressure ulcer stage II, not infected. Pressure reduction. Update CBC CMP TSH   Essential hypertension Controlled, continue Furosemide and Spironolactone. 03/28/16 Na 141, K 4.1, Bun 52, creat 1.22   DVT (deep venous thrombosis), left Hx, continue Eliquis.    Chronic diastolic congestive heart failure ([place]) Compensated clinically, continue Furosemide 53m, Spironolactone 245m 12/21/15 BNP 1234.4    AVM (arteriovenous malformation) of colon with hemorrhage Hgb 9.1 03/18/16  DM (diabetes mellitus), type 2, uncontrolled, with renal complications ([place]8/3/4/35etformin 50028mid. 03/18/16 Hgb 7.8  Chronic radicular pain of lower back Better, continue Gbapentin 300m65md, Norco 5/325mg6m bid and prn. Observe.    CKD (chronic  kidney disease), stage III Creat 1.22 03/28/16  Edema Improved, 1+ BLE, continue Furosemide and Spironolactone.   Bursitis of right hip Much improved, dc Prednisone.      Family/ staff Communication: AL  Labs/tests ordered:  CBC CMP TSH

## 2016-07-09 NOTE — Assessment & Plan Note (Signed)
Creat 1.22 03/28/16

## 2016-07-09 NOTE — Assessment & Plan Note (Signed)
Hx, continue Eliquis.  

## 2016-07-09 NOTE — Assessment & Plan Note (Addendum)
Left hip pressure ulcer stage II, not infected. Pressure reduction. Update CBC CMP TSH

## 2016-07-09 NOTE — Assessment & Plan Note (Signed)
12/19/15 Metformin 500mg bid. 03/18/16 Hgb 7.8 

## 2016-07-09 NOTE — Assessment & Plan Note (Signed)
Better, continue Gbapentin 300mg  bid, Norco 5/325mg  1/2 bid and prn. Observe.

## 2016-07-09 NOTE — Assessment & Plan Note (Signed)
Much improved, dc Prednisone.

## 2016-07-09 NOTE — Assessment & Plan Note (Signed)
Hgb 9.1 03/18/16

## 2016-07-09 NOTE — Assessment & Plan Note (Signed)
Controlled, continue Furosemide and Spironolactone. 03/28/16 Na 141, K 4.1, Bun 52, creat 1.22

## 2016-07-11 DIAGNOSIS — E039 Hypothyroidism, unspecified: Secondary | ICD-10-CM | POA: Diagnosis not present

## 2016-07-11 DIAGNOSIS — I10 Essential (primary) hypertension: Secondary | ICD-10-CM | POA: Diagnosis not present

## 2016-07-11 LAB — HEPATIC FUNCTION PANEL
ALT: 10 U/L (ref 7–35)
AST: 21 U/L (ref 13–35)
Alkaline Phosphatase: 36 U/L (ref 25–125)
Bilirubin, Total: 0.6 mg/dL

## 2016-07-11 LAB — TSH: TSH: 1.94 u[IU]/mL (ref <=5.90)

## 2016-07-11 LAB — BASIC METABOLIC PANEL
BUN: 49 mg/dL — AB (ref 4–21)
CREATININE: 1.3 mg/dL — AB (ref <=1.1)
Glucose: 91 mg/dL
POTASSIUM: 4 mmol/L (ref 3.4–5.3)
SODIUM: 142 mmol/L (ref 137–147)

## 2016-07-11 LAB — CBC AND DIFFERENTIAL
HCT: 43 % (ref 36–46)
HEMOGLOBIN: 13.6 g/dL (ref 12.0–16.0)
Platelets: 239 10*3/uL (ref 150–399)
WBC: 10.5 10^3/mL

## 2016-07-12 ENCOUNTER — Non-Acute Institutional Stay: Payer: Medicare Other | Admitting: Nurse Practitioner

## 2016-07-12 ENCOUNTER — Encounter: Payer: Self-pay | Admitting: Nurse Practitioner

## 2016-07-12 DIAGNOSIS — IMO0002 Reserved for concepts with insufficient information to code with codable children: Secondary | ICD-10-CM

## 2016-07-12 DIAGNOSIS — I10 Essential (primary) hypertension: Secondary | ICD-10-CM | POA: Diagnosis not present

## 2016-07-12 DIAGNOSIS — K5521 Angiodysplasia of colon with hemorrhage: Secondary | ICD-10-CM

## 2016-07-12 DIAGNOSIS — E1121 Type 2 diabetes mellitus with diabetic nephropathy: Secondary | ICD-10-CM

## 2016-07-12 DIAGNOSIS — N183 Chronic kidney disease, stage 3 unspecified: Secondary | ICD-10-CM

## 2016-07-12 DIAGNOSIS — R609 Edema, unspecified: Secondary | ICD-10-CM | POA: Diagnosis not present

## 2016-07-12 DIAGNOSIS — I5032 Chronic diastolic (congestive) heart failure: Secondary | ICD-10-CM | POA: Diagnosis not present

## 2016-07-12 DIAGNOSIS — M7061 Trochanteric bursitis, right hip: Secondary | ICD-10-CM | POA: Diagnosis not present

## 2016-07-12 DIAGNOSIS — E1165 Type 2 diabetes mellitus with hyperglycemia: Secondary | ICD-10-CM | POA: Diagnosis not present

## 2016-07-12 DIAGNOSIS — M5416 Radiculopathy, lumbar region: Secondary | ICD-10-CM

## 2016-07-12 DIAGNOSIS — Q2733 Arteriovenous malformation of digestive system vessel: Secondary | ICD-10-CM

## 2016-07-12 DIAGNOSIS — L89222 Pressure ulcer of left hip, stage 2: Secondary | ICD-10-CM

## 2016-07-12 DIAGNOSIS — G8929 Other chronic pain: Secondary | ICD-10-CM

## 2016-07-12 NOTE — Assessment & Plan Note (Signed)
Improved, 1+ BLE, continue Furosemide and Spironolactone.  

## 2016-07-12 NOTE — Assessment & Plan Note (Signed)
07/11/16  Na 142, K 4.0, bun 49, creat 1.25, TSH 1.94, wbc 10.4, Hgb 13.6, plt 239 07/12/16 staff reported slight odor to the area, will apply Silvadene cream bid until healed.

## 2016-07-12 NOTE — Assessment & Plan Note (Signed)
Hgb 13.6 07/11/16

## 2016-07-12 NOTE — Assessment & Plan Note (Signed)
Compensated clinically, continue Furosemide 20mg, Spironolactone 25mg. 12/21/15 BNP 1234.4 

## 2016-07-12 NOTE — Assessment & Plan Note (Signed)
12/19/15 Metformin 500mg bid. 03/18/16 Hgb 7.8 

## 2016-07-12 NOTE — Assessment & Plan Note (Signed)
Creat 1.25 07/11/16 

## 2016-07-12 NOTE — Assessment & Plan Note (Signed)
Much improved, off Prednisone.

## 2016-07-12 NOTE — Progress Notes (Signed)
Location:  [facility] Room Number: 23 Place of Service:  ALF [phone]) Provider:  Maryela Tapper, Manxie  NP  Jeanmarie Hubert, MD  Patient Care Team: Estill Dooms, MD as PCP - General (Internal Medicine) Deivi Huckins Otho Darner, NP as Nurse Practitioner (Internal Medicine) Rutherford Guys, MD as Consulting Physician (Ophthalmology)  Extended Emergency Contact Information Primary Emergency Contact: Raulston,Leonard Address: [address] [place] [address] Johnnette Litter of [place] Phone: [phone] Work Phone: [phone] Mobile Phone: [phone] Relation: Son Secondary Emergency Contact: Gerarda Fraction [place] of [place] Phone: [phone] Relation: Friend  Code Status:  DNR Goals of care: Advanced Directive information Advanced Directives 07/12/2016  Does Patient Have a Medical Advance Directive? Yes  Type of Paramedic of [place];Out of facility DNR (pink MOST or yellow form)  Does patient want to make changes to medical advance directive? No - Patient declined  Copy of [place] in Chart? Yes  Would patient like information on creating a medical advance directive? -  Pre-existing out of facility DNR order (yellow form or pink MOST form) -     Chief Complaint  Patient presents with  . Acute Visit    (L) hip odor detected by staff    HPI:  Pt is a 83 y.o. female seen today for an acute visit for left hip pressure ulcer, staff reported slight odor at the wound site, no odorous drainage noted upon my visit today, no peri-wound cellulitis noted.    Hx lower back pain, radiating to legs, worsens with movement, better with Gabapentin 320m bid, Norco 1/2 tab bid and 5/3249mq6h prn, but still pain still keeps her from getting night sleep and throughout day, Hx of BLE edema, improved on Furosemide 2072mSpironolactone 90m42mily. 12/16/15 Hgb a1c 9.5, 03/18/16 Hgb a1c 7.8, Metformin 500mg44m. Hx of DVT  LLE, taking Eliquis 5mg d5my. Hgb 13.6 07/11/16,  R hip bursitis, able to be off Prednisone   Past Medical History:  Diagnosis Date  . Acute blood loss anemia   . Allergy   . Asthma   . AVM (arteriovenous malformation) of colon with hemorrhage   . Bursitis of right hip   . Cataract   . CKD (chronic kidney disease), stage III 11/04/2015  . COLONIC POLYPS 03/26/2005   Qualifier: Diagnosis of  By: McMurrTalbert CageAAMA),[place]    . DIVERTICULOSIS, COLON 03/26/2005   Qualifier: Diagnosis of  By: McMurrTalbert CageAAMA),[place]    . DVT (deep venous thrombosis), left 11/03/2015  . Ecchymosis 12/16/2015  . Hyperglycemia 12/16/2015  . Hyperlipidemia   . Hypertension   . Multiple skin tears 12/16/2015  . Osteopenia   . Stasis edema of both lower extremities 11/04/2015  . Unstable gait 12/16/2015  . Urine incontinence 12/16/2015   Past Surgical History:  Procedure Laterality Date  . ABDOMINAL HYSTERECTOMY  2006   Dr. Wes DaMaryelizabeth RowanTARACT EXTRACTION W/ INTRAOCULAR LENS IMPLANT Bilateral 1999/2003   Dr. Mark SRutherford GuysLONOSCOPY N/A 11/06/2015   Procedure: COLONOSCOPY;  Surgeon: KavithMauri Pole [facility]: [facility];  Service: Endoscopy;  Laterality: N/A;  MAC if available, otherwise moderate sedation    Allergies  Allergen Reactions  . Epinephrine Nausea Only    Unknown.  . HydrMarland Kitchencodone-Acetaminophen     REACTION: nausea  . Labetalol   . Procaine     Allergies as of 07/12/2016  Reactions   Epinephrine Nausea Only   Unknown.   Hydrocodone-acetaminophen    REACTION: nausea   Labetalol    Procaine       Medication List       Accurate as of 07/12/16  6:02 PM. Always use your most recent med list.          acetaminophen 500 MG tablet Commonly known as:  TYLENOL Take 500 mg by mouth. Take one twice a day   calcium carbonate 500 MG chewable tablet Commonly known as:  TUMS - dosed in mg elemental calcium Chew 2 tablets by mouth daily.   ELIQUIS 5 MG Tabs tablet Generic  drug:  apixaban Take 5 mg by mouth. Take one tablet twice daily   feeding supplement (PRO-STAT SUGAR FREE 64) Liqd Take 30 mLs by mouth. Take 30 ml once daily   furosemide 20 MG tablet Commonly known as:  LASIX Take 1 tablet (20 mg total) by mouth daily.   gabapentin 100 MG capsule Commonly known as:  NEURONTIN Take 100 mg by mouth. Take 3 (363m) twice daily   HYDROcodone-acetaminophen 5-325 MG tablet Commonly known as:  NORCO/VICODIN Take by mouth every 6 (six) hours as needed for moderate pain. 1/2 tablet three times daily for pain   ICAPS MV Tabs Take 1 tablet by mouth daily.   multivitamin with minerals tablet Take 1 tablet by mouth. Take one tablet once daily   metFORMIN 500 MG tablet Commonly known as:  GLUCOPHAGE Take 500 mg by mouth 2 (two) times daily with a meal.   simvastatin 10 MG tablet Commonly known as:  ZOCOR Take 10 mg by mouth daily.   spironolactone 25 MG tablet Commonly known as:  ALDACTONE Take 25 mg by mouth. Take one tablet daily       Review of Systems  Constitutional: Positive for fatigue. Negative for activity change, appetite change, chills, diaphoresis, fever and unexpected weight change.  HENT: Negative for congestion, ear discharge, ear pain, hearing loss, nosebleeds, postnasal drip, rhinorrhea, sore throat, tinnitus, trouble swallowing and voice change.   Eyes: Negative for pain, discharge, redness, itching and visual disturbance.  Respiratory: Negative for cough, choking, shortness of breath and wheezing.   Cardiovascular: Positive for leg swelling (3+ bilateral lower legs). Negative for chest pain and palpitations.  Gastrointestinal: Positive for constipation. Negative for abdominal distention, abdominal pain, blood in stool, diarrhea, nausea and vomiting.       Diverticulosis coli. AV malformations of the colon. Internal hemorrhoids. History of GI bleed. History of decubitus ulceration in the perineal area. Last colonoscopy 11/06/2015  by Dr. NSilverio Decamp  Endocrine: Negative for cold intolerance, heat intolerance, polydipsia, polyphagia and polyuria.  Genitourinary: Positive for decreased urine volume. Negative for difficulty urinating, dysuria, flank pain, frequency, hematuria, pelvic pain, urgency and vaginal discharge.       History of urinary incontinence. CKD 3.  Musculoskeletal: Positive for arthralgias and gait problem. Negative for back pain, myalgias, neck pain and neck stiffness.       Chronic bursitis right hip.  Skin: Positive for wound (Multiple skin tears). Negative for color change, pallor and rash.       Multiple ecchymoses. Healed ulcer of the right greater trochanter. Left hip pressure ulcer stage II, not infected, no odorous drainage noted 07/12/16 upon my visit  Allergic/Immunologic: Negative.  Negative for environmental allergies.  Neurological: Negative for dizziness, tremors, seizures, syncope, weakness, numbness and headaches.       Repetitive. Shooting pains down legs  Hematological: Negative  for adenopathy. Does not bruise/bleed easily.       Acute blood loss anemia in June 2017 has resolved.  Psychiatric/Behavioral: Negative for agitation, behavioral problems, confusion, dysphoric mood, hallucinations, sleep disturbance and suicidal ideas. The patient is nervous/anxious. The patient is not hyperactive.     Immunization History  Administered Date(s) Administered  . DTaP 05/13/2005  . Influenza-Unspecified 02/11/2015, 02/22/2016  . Pneumococcal Conjugate-13 05/13/2014  . Pneumococcal Polysaccharide-23 05/13/2014  . Zoster 05/13/2014   Pertinent  Health Maintenance Due  Topic Date Due  . FOOT EXAM  09/12/1935  . OPHTHALMOLOGY EXAM  09/12/1935  . URINE MICROALBUMIN  09/12/1935  . PNA vac Low Risk Adult (2 of 2 - PPSV23) 05/14/2015  . HEMOGLOBIN A1C  09/15/2016  . INFLUENZA VACCINE  Completed  . DEXA SCAN  Completed   Fall Risk  11/03/2015  Falls in the past year? No   Functional Status  Survey:    Vitals:   07/12/16 1136  BP: 113/72  Pulse: 89  Resp: (!) 22  Temp: 97.4 F (36.3 C)  SpO2: 96%  Weight: 148 lb 12.8 oz (67.5 kg)  Height: 5' 3"  (1.6 m)   Body mass index is 26.36 kg/m. Physical Exam  Labs reviewed:  Recent Labs  11/03/15 2054 11/04/15 0452 11/06/15 0429 12/21/15 12/28/15 03/28/16  NA 139 140 142 143 140 141  K 4.1 3.8 4.0 5.1 4.5 4.1  CL 104 108 112*  --   --   --   CO2 25 25 21*  --   --   --   GLUCOSE 113* 87 241*  --   --   --   BUN 55* 54* 47* 40* 42* 52*  CREATININE 1.62* 1.46* 1.32* 1.3* 1.2* 1.2*  CALCIUM 10.0 9.0 8.5*  --   --   --     Recent Labs  11/03/15 1226 11/03/15 2054  AST 20 25  ALT 18 21  ALKPHOS 44 43  BILITOT 0.8 1.2  PROT 6.0* 5.7*  ALBUMIN 3.6 3.4*    Recent Labs  11/03/15 2054  11/05/15 0225 11/05/15 0453 11/06/15 0429 03/18/16  WBC 11.4*  < > 8.5 10.1 10.5 9.1  NEUTROABS 8.7*  --   --   --   --   --   HGB 12.4  < > 9.9* 10.1* 11.0* 12.2  HCT 36.8  < > 30.1* 30.9* 33.5* 39  MCV 85.2  < > 87.2 88.0 86.8  --   PLT 133*  < > 125* 115* 139* 248  < > = values in this interval not displayed. Lab Results  Component Value Date   TSH 1.32 12/21/2015   Lab Results  Component Value Date   HGBA1C 7.8 03/18/2016   No results found for: CHOL, HDL, LDLCALC, LDLDIRECT, TRIG, CHOLHDL  Significant Diagnostic Results in last 30 days:  No results found.  Assessment/Plan Stage II pressure ulcer of left hip 07/11/16  Na 142, K 4.0, bun 49, creat 1.25, TSH 1.94, wbc 10.4, Hgb 13.6, plt 239 07/12/16 staff reported slight odor to the area, will apply Silvadene cream bid until healed.   Essential hypertension Controlled, continue Furosemide and Spironolactone. 07/11/16  Na 142, K 4.0, bun 49, creat 1.25, TSH 1.94, wbc 10.4, Hgb 13.6, plt 239    Chronic diastolic congestive heart failure ([facility]) Compensated clinically, continue Furosemide 25m, Spironolactone 274m 12/21/15 BNP 1234.4  AVM (arteriovenous  malformation) of colon with hemorrhage Hgb 13.6 07/11/16  DM (diabetes mellitus), type 2, uncontrolled, with renal  complications ([place]) 07/18/14 Metformin 545m bid. 03/18/16 Hgb 7.8  Chronic radicular pain of lower back livable, continue Gbapentin 3070mbid, Norco 5/32587m/2 bid and prn. Observe.    Bursitis of right hip Much improved, off Prednisone.    CKD (chronic kidney disease), stage III Creat 1.25 07/11/16  Edema Improved, 1+ BLE, continue Furosemide and Spironolactone.      Family/ staff Communication: AL  Labs/tests ordered:  none

## 2016-07-12 NOTE — Assessment & Plan Note (Signed)
livable, continue Gbapentin 300mg bid, Norco 5/325mg 1/2 bid and prn. Observe.   

## 2016-07-12 NOTE — Assessment & Plan Note (Addendum)
Controlled, continue Furosemide and Spironolactone. 07/11/16  Na 142, K 4.0, bun 49, creat 1.25, TSH 1.94, wbc 10.4, Hgb 13.6, plt 239 

## 2016-07-15 ENCOUNTER — Other Ambulatory Visit: Payer: Self-pay | Admitting: *Deleted

## 2016-07-26 ENCOUNTER — Encounter: Payer: Self-pay | Admitting: Nurse Practitioner

## 2016-07-26 ENCOUNTER — Non-Acute Institutional Stay: Payer: Medicare Other | Admitting: Nurse Practitioner

## 2016-07-26 DIAGNOSIS — I10 Essential (primary) hypertension: Secondary | ICD-10-CM | POA: Diagnosis not present

## 2016-07-26 DIAGNOSIS — M5416 Radiculopathy, lumbar region: Secondary | ICD-10-CM | POA: Diagnosis not present

## 2016-07-26 DIAGNOSIS — I5032 Chronic diastolic (congestive) heart failure: Secondary | ICD-10-CM

## 2016-07-26 DIAGNOSIS — Z86718 Personal history of other venous thrombosis and embolism: Secondary | ICD-10-CM

## 2016-07-26 DIAGNOSIS — N183 Chronic kidney disease, stage 3 unspecified: Secondary | ICD-10-CM

## 2016-07-26 DIAGNOSIS — R609 Edema, unspecified: Secondary | ICD-10-CM | POA: Diagnosis not present

## 2016-07-26 DIAGNOSIS — E1165 Type 2 diabetes mellitus with hyperglycemia: Secondary | ICD-10-CM | POA: Diagnosis not present

## 2016-07-26 DIAGNOSIS — E1121 Type 2 diabetes mellitus with diabetic nephropathy: Secondary | ICD-10-CM

## 2016-07-26 DIAGNOSIS — IMO0002 Reserved for concepts with insufficient information to code with codable children: Secondary | ICD-10-CM

## 2016-07-26 DIAGNOSIS — L89222 Pressure ulcer of left hip, stage 2: Secondary | ICD-10-CM

## 2016-07-26 DIAGNOSIS — B372 Candidiasis of skin and nail: Secondary | ICD-10-CM | POA: Diagnosis not present

## 2016-07-26 DIAGNOSIS — L89312 Pressure ulcer of right buttock, stage 2: Secondary | ICD-10-CM | POA: Diagnosis not present

## 2016-07-26 DIAGNOSIS — G8929 Other chronic pain: Secondary | ICD-10-CM

## 2016-07-26 NOTE — Assessment & Plan Note (Signed)
Controlled, continue Furosemide and Spironolactone. 07/11/16  Na 142, K 4.0, bun 49, creat 1.25, TSH 1.94, wbc 10.4, Hgb 13.6, plt 239

## 2016-07-26 NOTE — Assessment & Plan Note (Addendum)
07/11/16  Na 142, K 4.0, bun 49, creat 1.25, TSH 1.94, wbc 10.4, Hgb 13.6, plt 239 07/12/16 staff reported slight odor to the area, will apply Silvadene cream bid until healed.  07/26/16 the left great trochanter, healing slowly

## 2016-07-26 NOTE — Progress Notes (Signed)
Location:  [facility] Room Number: 23 Place of Service:  ALF [phone]) Provider: Mast, Manxie  NP  Jeanmarie Hubert, MD  Patient Care Team: Estill Dooms, MD as PCP - General (Internal Medicine) Man Otho Darner, NP as Nurse Practitioner (Internal Medicine) Rutherford Guys, MD as Consulting Physician (Ophthalmology)  Extended Emergency Contact Information Primary Emergency Contact: Morocho,Leonard Address: [address] [place] [address] Johnnette Litter of [place] Phone: [phone] Work Phone: [phone] Mobile Phone: [phone] Relation: Son Secondary Emergency Contact: Gerarda Fraction States of [place] Phone: [phone] Relation: Friend  Code Status:  DNR Goals of care: Advanced Directive information Advanced Directives 07/26/2016  Does Patient Have a Medical Advance Directive? Yes  Type of Paramedic of [place];Out of facility DNR (pink MOST or yellow form)  Does patient want to make changes to medical advance directive? No - Patient declined  Copy of [place] in Chart? Yes  Would patient like information on creating a medical advance directive? -  Pre-existing out of facility DNR order (yellow form or pink MOST form) Yellow form placed in chart (order not valid for inpatient use)     Chief Complaint  Patient presents with  . Acute Visit    Redness in fold of abd.    HPI:  Pt is a 83 y.o. female seen today for an acute visit for abd skin fold redness, mid of the right buttock, not infected.     Hx lower back pain, radiating to legs, worsens with movement, better with Gabapentin 327m bid, Norco 1/2 tab TID and 5/321mq6h prn.  Hx of BLE edema, improved on Furosemide 2026mSpironolactone 51m7mily. 12/16/15 Hgb a1c 9.5, 03/18/16 Hgb a1c 7.8, Metformin 500mg99m. Hx of DVT LLE, taking Eliquis 2.5mg d64my. Hgb 13.6 07/11/16,  R hip bursitis, able to be off Prednisone. Left hip  pressure ulcer healing slowly, not infected  Past Medical History:  Diagnosis Date  . Acute blood loss anemia   . Allergy   . Asthma   . AVM (arteriovenous malformation) of colon with hemorrhage   . Bursitis of right hip   . Cataract   . CKD (chronic kidney disease), stage III 11/04/2015  . COLONIC POLYPS 03/26/2005   Qualifier: Diagnosis of  By: McMurrTalbert CageAAMA),Amadore    . DIVERTICULOSIS, COLON 03/26/2005   Qualifier: Diagnosis of  By: McMurrTalbert CageAAMA),[place]    . DVT (deep venous thrombosis), left 11/03/2015  . Ecchymosis 12/16/2015  . Hyperglycemia 12/16/2015  . Hyperlipidemia   . Hypertension   . Multiple skin tears 12/16/2015  . Osteopenia   . Stasis edema of both lower extremities 11/04/2015  . Unstable gait 12/16/2015  . Urine incontinence 12/16/2015   Past Surgical History:  Procedure Laterality Date  . ABDOMINAL HYSTERECTOMY  2006   Dr. Wes DaMaryelizabeth RowanTARACT EXTRACTION W/ INTRAOCULAR LENS IMPLANT Bilateral 1999/2003   Dr. Mark SRutherford GuysLONOSCOPY N/A 11/06/2015   Procedure: COLONOSCOPY;  Surgeon: KavithMauri Pole Location: [facility];  Service: Endoscopy;  Laterality: N/A;  MAC if available, otherwise moderate sedation    Allergies  Allergen Reactions  . Epinephrine Nausea Only    Unknown.  . HydrMarland Kitchencodone-Acetaminophen     REACTION: nausea  . Labetalol   . Procaine     Allergies as of 07/26/2016      Reactions   Epinephrine Nausea Only  Unknown.   Hydrocodone-acetaminophen    REACTION: nausea   Labetalol    Procaine       Medication List       Accurate as of 07/26/16  2:29 PM. Always use your most recent med list.          acetaminophen 500 MG tablet Commonly known as:  TYLENOL Take 500 mg by mouth. Take one twice a day   calcium carbonate 500 MG chewable tablet Commonly known as:  TUMS - dosed in mg elemental calcium Chew 2 tablets by mouth daily.   ELIQUIS 5 MG Tabs tablet Generic drug:  apixaban Take 5 mg by mouth. Take one  tablet twice daily   feeding supplement (PRO-STAT SUGAR FREE 64) Liqd Take 30 mLs by mouth. Take 30 ml once daily   furosemide 20 MG tablet Commonly known as:  LASIX Take 1 tablet (20 mg total) by mouth daily.   gabapentin 100 MG capsule Commonly known as:  NEURONTIN Take 3 (374m) twice daily   HYDROcodone-acetaminophen 5-325 MG tablet Commonly known as:  NORCO/VICODIN Take by mouth every 6 (six) hours as needed for moderate pain. 1/2 tablet three times daily for pain   ICAPS MV Tabs Take 1 tablet by mouth daily.   multivitamin with minerals tablet Take 1 tablet by mouth. Take one tablet once daily   metFORMIN 500 MG tablet Commonly known as:  GLUCOPHAGE Take 500 mg by mouth 2 (two) times daily with a meal.   simvastatin 10 MG tablet Commonly known as:  ZOCOR Take 10 mg by mouth daily.   spironolactone 25 MG tablet Commonly known as:  ALDACTONE Take 25 mg by mouth. Take one tablet daily       Review of Systems  Constitutional: Positive for fatigue. Negative for activity change, appetite change, chills, diaphoresis, fever and unexpected weight change.  HENT: Negative for congestion, ear discharge, ear pain, hearing loss, nosebleeds, postnasal drip, rhinorrhea, sore throat, tinnitus, trouble swallowing and voice change.   Eyes: Negative for pain, discharge, redness, itching and visual disturbance.  Respiratory: Negative for cough, choking, shortness of breath and wheezing.   Cardiovascular: Positive for leg swelling (3+ bilateral lower legs). Negative for chest pain and palpitations.  Gastrointestinal: Positive for constipation. Negative for abdominal distention, abdominal pain, blood in stool, diarrhea, nausea and vomiting.       Diverticulosis coli. AV malformations of the colon. Internal hemorrhoids. History of GI bleed. History of decubitus ulceration in the perineal area. Last colonoscopy 11/06/2015 by Dr. NSilverio Decamp  Endocrine: Negative for cold intolerance, heat  intolerance, polydipsia, polyphagia and polyuria.  Genitourinary: Negative for decreased urine volume, difficulty urinating, dysuria, flank pain, frequency, hematuria, pelvic pain, urgency and vaginal discharge.       History of urinary incontinence. CKD 3.  Musculoskeletal: Positive for arthralgias and gait problem. Negative for back pain, myalgias, neck pain and neck stiffness.       Chronic bursitis right hip.  Skin: Positive for wound (Multiple skin tears). Negative for color change, pallor and rash.       Healed ulcer of the right greater trochanter. Left hip(the great trochanter) pressure ulcer stage II, healing slowly. The mid of right buttock pressure ulcer, stage II, about 0.5cm diameter, not infected.   Allergic/Immunologic: Negative.  Negative for environmental allergies.  Neurological: Negative for dizziness, tremors, seizures, syncope, weakness, numbness and headaches.       Repetitive. Shooting pains down legs  Hematological: Negative for adenopathy. Does not bruise/bleed easily.  Acute blood loss anemia in June 2017 has resolved.  Psychiatric/Behavioral: Negative for agitation, behavioral problems, confusion, dysphoric mood, hallucinations, sleep disturbance and suicidal ideas. The patient is nervous/anxious. The patient is not hyperactive.     Immunization History  Administered Date(s) Administered  . DTaP 05/13/2005  . Influenza-Unspecified 02/11/2015, 02/22/2016  . Pneumococcal Conjugate-13 05/13/2014  . Pneumococcal Polysaccharide-23 05/13/2014  . Zoster 05/13/2014   Pertinent  Health Maintenance Due  Topic Date Due  . FOOT EXAM  09/12/1935  . OPHTHALMOLOGY EXAM  09/12/1935  . URINE MICROALBUMIN  09/12/1935  . PNA vac Low Risk Adult (2 of 2 - PPSV23) 05/14/2015  . HEMOGLOBIN A1C  09/15/2016  . INFLUENZA VACCINE  Completed  . DEXA SCAN  Completed   Fall Risk  11/03/2015  Falls in the past year? No   Functional Status Survey:    Vitals:   07/26/16 1312    BP: 122/64  Pulse: 81  Resp: 12  Temp: 97.1 F (36.2 C)  SpO2: 95%  Weight: 149 lb (67.6 kg)  Height: 5' 3"  (1.6 m)   Body mass index is 26.39 kg/m. Physical Exam  Constitutional: She is oriented to person, place, and time. She appears well-developed and well-nourished. No distress.  Elderly and fragile.  HENT:  Right Ear: External ear normal.  Left Ear: External ear normal.  Nose: Nose normal.  Mouth/Throat: Oropharynx is clear and moist. No oropharyngeal exudate.  Eyes: Conjunctivae and EOM are normal. Pupils are equal, round, and reactive to light. No scleral icterus.  Neck: No JVD present. No tracheal deviation present. No thyromegaly present.  Cardiovascular: Normal rate, regular rhythm and normal heart sounds.  Exam reveals no gallop and no friction rub.   No murmur heard. Unable to feel DP and PT bilaterally due to edema.  Pulmonary/Chest: Effort normal. No respiratory distress. She has no wheezes. She has no rales. She exhibits no tenderness.  Abdominal: She exhibits no distension and no mass. There is no tenderness.  Musculoskeletal: Normal range of motion. She exhibits edema (2+ bipedal from the knee to the foot). She exhibits no tenderness.  Tender at the right hip and down the lateral right leg. Using walker with 2 front wheels and rear skids. Very unstable with walking.  Lymphadenopathy:    She has no cervical adenopathy.  Neurological: She is alert and oriented to person, place, and time. No cranial nerve deficit. Coordination abnormal.  Skin: No rash noted. She is not diaphoretic. No erythema. No pallor.  Healed ulcer of the right greater trochanter. Left hip(the great trochanter) pressure ulcer stage II, healing slowly. The mid of right buttock pressure ulcer, stage II, about 0.5cm diameter, not infected.    Psychiatric: She has a normal mood and affect. Her behavior is normal. Judgment and thought content normal.    Labs reviewed:  Recent Labs   11/03/15 2054 11/04/15 0452 11/06/15 0429  12/28/15 03/28/16 07/11/16  NA 139 140 142  < > 140 141 142  K 4.1 3.8 4.0  < > 4.5 4.1 4.0  CL 104 108 112*  --   --   --   --   CO2 25 25 21*  --   --   --   --   GLUCOSE 113* 87 241*  --   --   --   --   BUN 55* 54* 47*  < > 42* 52* 49*  CREATININE 1.62* 1.46* 1.32*  < > 1.2* 1.2* 1.3*  CALCIUM 10.0 9.0 8.5*  --   --   --   --   < > =  values in this interval not displayed.  Recent Labs  11/03/15 1226 11/03/15 2054 07/11/16  AST 20 25 21   ALT 18 21 10   ALKPHOS 44 43 36  BILITOT 0.8 1.2  --   PROT 6.0* 5.7*  --   ALBUMIN 3.6 3.4*  --     Recent Labs  11/03/15 2054  11/05/15 0225 11/05/15 0453 11/06/15 0429 03/18/16 07/11/16  WBC 11.4*  < > 8.5 10.1 10.5 9.1 10.5  NEUTROABS 8.7*  --   --   --   --   --   --   HGB 12.4  < > 9.9* 10.1* 11.0* 12.2 13.6  HCT 36.8  < > 30.1* 30.9* 33.5* 39 43  MCV 85.2  < > 87.2 88.0 86.8  --   --   PLT 133*  < > 125* 115* 139* 248 239  < > = values in this interval not displayed. Lab Results  Component Value Date   TSH 1.94 07/11/2016   Lab Results  Component Value Date   HGBA1C 7.8 03/18/2016   No results found for: CHOL, HDL, LDLCALC, LDLDIRECT, TRIG, CHOLHDL  Significant Diagnostic Results in last 30 days:  No results found.  Assessment/Plan Skin candidiasis abd skin folds, apply Nystatin powder bid to affected area x 2 weeks.   Essential hypertension Controlled, continue Furosemide and Spironolactone. 07/11/16  Na 142, K 4.0, bun 49, creat 1.25, TSH 1.94, wbc 10.4, Hgb 13.6, plt 239    Chronic diastolic congestive heart failure (HCC) Compensated clinically, continue Furosemide 17m, Spironolactone 246m 12/21/15 BNP 1234.4  DM (diabetes mellitus), type 2, uncontrolled, with renal complications (HCLake Magdalene8/4/0/98etformin 50066mid. 03/18/16 Hgb 7.8  Chronic radicular pain of lower back livable, continue Gbapentin 300m48md, Norco 5/325mg34m bid and prn. Observe.    Stage II  pressure ulcer of left hip 07/11/16  Na 142, K 4.0, bun 49, creat 1.25, TSH 1.94, wbc 10.4, Hgb 13.6, plt 239 07/12/16 staff reported slight odor to the area, will apply Silvadene cream bid until healed.  07/26/16 the left great trochanter, healing slowly  CKD (chronic kidney disease), stage III Creat 1.25 07/11/16  Edema Improved, 1+ BLE, continue Furosemide and Spironolactone.   History of DVT (deep vein thrombosis) Left, 07/26/16 Pharm decrease Eliquis 2.5mg b110m Pressure ulcer of right buttock, stage 2 Mid of the right buttock, not infected, pressure reduction, apply barrier ointment bid until healed.      Family/ staff Communication: AL  Labs/tests ordered:  none

## 2016-07-26 NOTE — Assessment & Plan Note (Signed)
abd skin folds, apply Nystatin powder bid to affected area x 2 weeks.

## 2016-07-26 NOTE — Assessment & Plan Note (Signed)
Improved, 1+ BLE, continue Furosemide and Spironolactone.

## 2016-07-26 NOTE — Assessment & Plan Note (Signed)
12/19/15 Metformin 500mg  bid. 03/18/16 Hgb 7.8

## 2016-07-26 NOTE — Assessment & Plan Note (Signed)
Compensated clinically, continue Furosemide 20mg , Spironolactone 25mg . 12/21/15 BNP 1234.4

## 2016-07-26 NOTE — Assessment & Plan Note (Signed)
livable, continue Gbapentin 300mg  bid, Norco 5/325mg  1/2 bid and prn. Observe.

## 2016-07-26 NOTE — Assessment & Plan Note (Addendum)
Left, 07/26/16 Pharm decrease Eliquis 2.5mg  bid

## 2016-07-26 NOTE — Assessment & Plan Note (Signed)
Creat 1.25 07/11/16

## 2016-07-26 NOTE — Assessment & Plan Note (Signed)
Mid of the right buttock, not infected, pressure reduction, apply barrier ointment bid until healed.

## 2016-08-13 ENCOUNTER — Encounter: Payer: Self-pay | Admitting: Nurse Practitioner

## 2016-08-13 ENCOUNTER — Non-Acute Institutional Stay: Payer: Medicare Other | Admitting: Nurse Practitioner

## 2016-08-13 DIAGNOSIS — R609 Edema, unspecified: Secondary | ICD-10-CM | POA: Diagnosis not present

## 2016-08-13 DIAGNOSIS — G8929 Other chronic pain: Secondary | ICD-10-CM

## 2016-08-13 DIAGNOSIS — E1165 Type 2 diabetes mellitus with hyperglycemia: Secondary | ICD-10-CM

## 2016-08-13 DIAGNOSIS — Z86718 Personal history of other venous thrombosis and embolism: Secondary | ICD-10-CM

## 2016-08-13 DIAGNOSIS — L89312 Pressure ulcer of right buttock, stage 2: Secondary | ICD-10-CM

## 2016-08-13 DIAGNOSIS — E1121 Type 2 diabetes mellitus with diabetic nephropathy: Secondary | ICD-10-CM | POA: Diagnosis not present

## 2016-08-13 DIAGNOSIS — L89222 Pressure ulcer of left hip, stage 2: Secondary | ICD-10-CM | POA: Diagnosis not present

## 2016-08-13 DIAGNOSIS — N183 Chronic kidney disease, stage 3 unspecified: Secondary | ICD-10-CM

## 2016-08-13 DIAGNOSIS — I5032 Chronic diastolic (congestive) heart failure: Secondary | ICD-10-CM | POA: Diagnosis not present

## 2016-08-13 DIAGNOSIS — M5416 Radiculopathy, lumbar region: Secondary | ICD-10-CM

## 2016-08-13 DIAGNOSIS — I10 Essential (primary) hypertension: Secondary | ICD-10-CM | POA: Diagnosis not present

## 2016-08-13 DIAGNOSIS — IMO0002 Reserved for concepts with insufficient information to code with codable children: Secondary | ICD-10-CM

## 2016-08-13 NOTE — Assessment & Plan Note (Signed)
Left, 07/26/16 Pharm decrease Eliquis 2.5mg  bid

## 2016-08-13 NOTE — Assessment & Plan Note (Signed)
Creat 1.25 07/11/16

## 2016-08-13 NOTE — Assessment & Plan Note (Signed)
12/19/15 Metformin 500mg  bid. 03/18/16 Hgb 7.8

## 2016-08-13 NOTE — Assessment & Plan Note (Signed)
livable, continue Gbapentin 300mg  bid, Norco 5/325mg  1/2 tid and prn. Observe.

## 2016-08-13 NOTE — Assessment & Plan Note (Signed)
Controlled, continue Furosemide and Spironolactone. 07/11/16  Na 142, K 4.0, bun 49, creat 1.25, TSH 1.94, wbc 10.4, Hgb 13.6, plt 239

## 2016-08-13 NOTE — Assessment & Plan Note (Signed)
Healing

## 2016-08-13 NOTE — Assessment & Plan Note (Signed)
Compensated clinically, continue Furosemide 20mg , Spironolactone 25mg . 12/21/15 BNP 1234.4

## 2016-08-13 NOTE — Progress Notes (Signed)
Location:  [facility] Room Number: 23 Place of Service:  ALF [phone]) Provider:  Mast, Manxie  NP  Jeanmarie Hubert, MD  Patient Care Team: Estill Dooms, MD as PCP - General (Internal Medicine) Man Otho Darner, NP as Nurse Practitioner (Internal Medicine) Rutherford Guys, MD as Consulting Physician (Ophthalmology)  Extended Emergency Contact Information Primary Emergency Contact: Motz,Leonard Address: [address] [place] [address] Johnnette Litter of [place] Phone: [phone] Work Phone: [phone] Mobile Phone: [phone] Relation: Son Secondary Emergency Contact: Gerarda Fraction States of [place] Phone: [phone] Relation: Friend  Code Status:  DNR Goals of care: Advanced Directive information Advanced Directives 08/13/2016  Does Patient Have a Medical Advance Directive? Yes  Type of Paramedic of [place];Out of facility DNR (pink MOST or yellow form)  Does patient want to make changes to medical advance directive? No - Patient declined  Copy of [place] in Chart? Yes  Would patient like information on creating a medical advance directive? -  Pre-existing out of facility DNR order (yellow form or pink MOST form) Yellow form placed in chart (order not valid for inpatient use)     Chief Complaint  Patient presents with  . Medical Management of Chronic Issues    HPI:  Pt is a 83 y.o. [name] seen today for medical management of chronic diseases.     Past Medical History:  Diagnosis Date  . Acute blood loss anemia   . Allergy   . Asthma   . AVM (arteriovenous malformation) of colon with hemorrhage   . Bursitis of right hip   . Cataract   . CKD (chronic kidney disease), stage III 11/04/2015  . COLONIC POLYPS 03/26/2005   Qualifier: Diagnosis of  By: Talbert Cage CMA ([place]), June    . DIVERTICULOSIS, COLON 03/26/2005   Qualifier: Diagnosis of  By: Talbert Cage CMA ([place]),  June    . DVT (deep venous thrombosis), left 11/03/2015  . Ecchymosis 12/16/2015  . Hyperglycemia 12/16/2015  . Hyperlipidemia   . Hypertension   . Multiple skin tears 12/16/2015  . Osteopenia   . Stasis edema of both lower extremities 11/04/2015  . Unstable gait 12/16/2015  . Urine incontinence 12/16/2015   Past Surgical History:  Procedure Laterality Date  . ABDOMINAL HYSTERECTOMY  2006   Dr. Maryelizabeth Rowan  . CATARACT EXTRACTION W/ INTRAOCULAR LENS IMPLANT Bilateral 1999/2003   Dr. Rutherford Guys  . COLONOSCOPY N/A 11/06/2015   Procedure: COLONOSCOPY;  Surgeon: Mauri Pole, MD;  Location: [facility];  Service: Endoscopy;  Laterality: N/A;  MAC if available, otherwise moderate sedation    Allergies  Allergen Reactions  . Epinephrine Nausea Only    Unknown.  Marland Kitchen Hydrocodone-Acetaminophen     REACTION: nausea  . Labetalol   . Procaine     Allergies as of 08/13/2016      Reactions   Epinephrine Nausea Only   Unknown.   Hydrocodone-acetaminophen    REACTION: nausea   Labetalol    Procaine       Medication List       Accurate as of 08/13/16  2:51 PM. Always use your most recent med list.          acetaminophen 500 MG tablet Commonly known as:  TYLENOL Take 500 mg by mouth. Take one twice a day   calcium carbonate 500 MG chewable tablet Commonly known as:  TUMS - dosed  in mg elemental calcium Chew 2 tablets by mouth daily.   ELIQUIS 5 MG Tabs tablet Generic drug:  apixaban Take 5 mg by mouth. Take one tablet twice daily   feeding supplement (PRO-STAT SUGAR FREE 64) Liqd Take 30 mLs by mouth. Take 30 ml once daily   furosemide 20 MG tablet Commonly known as:  LASIX Take 1 tablet (20 mg total) by mouth daily.   gabapentin 100 MG capsule Commonly known as:  NEURONTIN Take 3 (348m) twice daily   HYDROcodone-acetaminophen 5-325 MG tablet Commonly known as:  NORCO/VICODIN Take by mouth every 6 (six) hours as needed for moderate pain. 1/2 tablet three times daily for  pain   ICAPS MV Tabs Take 1 tablet by mouth daily.   multivitamin with minerals tablet Take 1 tablet by mouth. Take one tablet once daily   metFORMIN 500 MG tablet Commonly known as:  GLUCOPHAGE Take 500 mg by mouth 2 (two) times daily with a meal.   simvastatin 10 MG tablet Commonly known as:  ZOCOR Take 10 mg by mouth daily.   spironolactone 25 MG tablet Commonly known as:  ALDACTONE Take 25 mg by mouth. Take one tablet daily       Review of Systems  Immunization History  Administered Date(s) Administered  . DTaP 05/13/2005  . Influenza-Unspecified 02/11/2015, 02/22/2016  . Pneumococcal Conjugate-13 05/13/2014  . Pneumococcal Polysaccharide-23 05/13/2014  . Zoster 05/13/2014   Pertinent  Health Maintenance Due  Topic Date Due  . FOOT EXAM  09/12/1935  . OPHTHALMOLOGY EXAM  09/12/1935  . URINE MICROALBUMIN  09/12/1935  . PNA vac Low Risk Adult (2 of 2 - PPSV23) 05/14/2015  . HEMOGLOBIN A1C  09/15/2016  . INFLUENZA VACCINE  12/11/2016  . DEXA SCAN  Completed   Fall Risk  11/03/2015  Falls in the past year? No   Functional Status Survey:    Vitals:   08/13/16 1443  BP: 118/61  Pulse: 67  Resp: 12  Temp: 97.4 F (36.3 C)  SpO2: 97%  Weight: 150 lb 3.2 oz (68.1 kg)  Height: _0  (1.6 m)   Body mass index is 26.61 kg/m. Physical Exam  Labs reviewed:  Recent Labs  11/03/15 2054 11/04/15 0452 11/06/15 0429  12/28/15 03/28/16 07/11/16  NA 139 140 142  < > 140 141 142  K 4.1 3.8 4.0  < > 4.5 4.1 4.0  CL 104 108 112*  --   --   --   --   CO2 25 25 21*  --   --   --   --   GLUCOSE 113* 87 241*  --   --   --   --   BUN 55* 54* 47*  < > 42* 52* 49*  CREATININE 1.62* 1.46* 1.32*  < > 1.2* 1.2* 1.3*  CALCIUM 10.0 9.0 8.5*  --   --   --   --   < > = values in this interval not displayed.  Recent Labs  11/03/15 1226 11/03/15 2054 07/11/16  AST _1 ALT _2 ALKPHOS 44 43 36  BILITOT 0.8 1.2  --   PROT 6.0* 5.7*  --   ALBUMIN 3.6 3.4*   --     Recent Labs  11/03/15 2054  11/05/15 0225 11/05/15 0453 11/06/15 0429 03/18/16 07/11/16  WBC 11.4*  < > 8.5 10.1 10.5 9.1 10.5  NEUTROABS 8.7*  --   --   --   --   --   --  HGB 12.4  < > 9.9* 10.1* 11.0* 12.2 13.6  HCT 36.8  < > 30.1* 30.9* 33.5* 39 43  MCV 85.2  < > 87.2 88.0 86.8  --   --   PLT 133*  < > 125* 115* 139* 248 239  < > = values in this interval not displayed. Lab Results  Component Value Date   TSH 1.94 07/11/2016   Lab Results  Component Value Date   HGBA1C 7.8 03/18/2016   No results found for: CHOL, HDL, LDLCALC, LDLDIRECT, TRIG, CHOLHDL  Significant Diagnostic Results in last 30 days:  No results found.  Assessment/Plan There are no diagnoses linked to this encounter.   Family/ staff Communication:   Labs/tests ordered:      This encounter was created in error - please disregard.

## 2016-08-13 NOTE — Progress Notes (Signed)
Location:  [facility] Room Number: 23 Place of Service:  ALF [phone]) Provider: Alashia Brownfield, Manxie  NP  Jeanmarie Hubert, MD  Patient Care Team: Estill Dooms, MD as PCP - General (Internal Medicine) Jaison Petraglia Otho Darner, NP as Nurse Practitioner (Internal Medicine) Rutherford Guys, MD as Consulting Physician (Ophthalmology)  Extended Emergency Contact Information Primary Emergency Contact: Cashatt,Leonard Address: [address] [place] [address] Johnnette Litter of [place] Phone: [phone] Work Phone: [phone] Mobile Phone: [phone] Relation: Son Secondary Emergency Contact: Gerarda Fraction [place] of [place] Phone: [phone] Relation: Friend  Code Status:  DNR Goals of care: Advanced Directive information Advanced Directives 08/13/2016  Does Patient Have a Medical Advance Directive? Yes  Type of Paramedic of [place];Out of facility DNR (pink MOST or yellow form)  Does patient want to make changes to medical advance directive? No - Patient declined  Copy of [place] in Chart? Yes  Would patient like information on creating a medical advance directive? -  Pre-existing out of facility DNR order (yellow form or pink MOST form) Yellow form placed in chart (order not valid for inpatient use)     No chief complaint on file.   HPI:  Pt is a 83 y.o. female seen today for evaluation of chronic medical conditions.     Hx lower back pain, radiating to legs, worsens with movement, better with Gabapentin 330m bid, Norco 1/2 tab TID and 5/[id number] prn.  Hx of BLE edema, improved on Furosemide 2080mSpironolactone 98m74mily. 12/16/15 Hgb a1c 9.5, 03/18/16 Hgb a1c 7.8, Metformin 500mg12m. Hx of DVT LLE, taking Eliquis 2.5mg b35m Hgb 13.6 07/11/16,  R hip bursitis, able to be off Prednisone. Left hip pressure ulcer healing slowly, not infected  Past Medical History:  Diagnosis Date  . Acute blood  loss anemia   . Allergy   . Asthma   . AVM (arteriovenous malformation) of colon with hemorrhage   . Bursitis of right hip   . Cataract   . CKD (chronic kidney disease), stage III 11/04/2015  . COLONIC POLYPS 03/26/2005   Qualifier: Diagnosis of  By: McMurrTalbert CageAAMA),[place]    . DIVERTICULOSIS, COLON 03/26/2005   Qualifier: Diagnosis of  By: McMurrTalbert CageAAMA),[place]    . DVT (deep venous thrombosis), left 11/03/2015  . Ecchymosis 12/16/2015  . Hyperglycemia 12/16/2015  . Hyperlipidemia   . Hypertension   . Multiple skin tears 12/16/2015  . Osteopenia   . Stasis edema of both lower extremities 11/04/2015  . Unstable gait 12/16/2015  . Urine incontinence 12/16/2015   Past Surgical History:  Procedure Laterality Date  . ABDOMINAL HYSTERECTOMY  2006   Dr. Wes DaMaryelizabeth RowanTARACT EXTRACTION W/ INTRAOCULAR LENS IMPLANT Bilateral 1999/2003   Dr. Mark SRutherford GuysLONOSCOPY N/A 11/06/2015   Procedure: COLONOSCOPY;  Surgeon: KavithMauri Pole Location: [facility];  Service: Endoscopy;  Laterality: N/A;  MAC if available, otherwise moderate sedation    Allergies  Allergen Reactions  . Epinephrine Nausea Only    Unknown.  . HydrMarland Kitchencodone-Acetaminophen     REACTION: nausea  . Labetalol   . Procaine     Allergies as of 08/13/2016      Reactions   Epinephrine Nausea Only   Unknown.   Hydrocodone-acetaminophen    REACTION: nausea   Labetalol    Procaine  Medication List       Accurate as of 08/13/16 11:59 PM. Always use your most recent med list.          acetaminophen 500 MG tablet Commonly known as:  TYLENOL Take 500 mg by mouth. Take one twice a day   calcium carbonate 500 MG chewable tablet Commonly known as:  TUMS - dosed in mg elemental calcium Chew 2 tablets by mouth daily.   ELIQUIS 5 MG Tabs tablet Generic drug:  apixaban Take 5 mg by mouth. Take one tablet twice daily   feeding supplement (PRO-STAT SUGAR FREE 64) Liqd Take 30 mLs by mouth. Take 30 ml  once daily   furosemide 20 MG tablet Commonly known as:  LASIX Take 1 tablet (20 mg total) by mouth daily.   gabapentin 100 MG capsule Commonly known as:  NEURONTIN Take 3 (343m) twice daily   HYDROcodone-acetaminophen 5-325 MG tablet Commonly known as:  NORCO/VICODIN Take by mouth every 6 (six) hours as needed for moderate pain. 1/2 tablet three times daily for pain   ICAPS MV Tabs Take 1 tablet by mouth daily.   multivitamin with minerals tablet Take 1 tablet by mouth. Take one tablet once daily   metFORMIN 500 MG tablet Commonly known as:  GLUCOPHAGE Take 500 mg by mouth 2 (two) times daily with a meal.   simvastatin 10 MG tablet Commonly known as:  ZOCOR Take 10 mg by mouth daily.   spironolactone 25 MG tablet Commonly known as:  ALDACTONE Take 25 mg by mouth. Take one tablet daily       Review of Systems  Constitutional: Positive for fatigue. Negative for activity change, appetite change, chills, diaphoresis, fever and unexpected weight change.  HENT: Negative for congestion, ear discharge, ear pain, hearing loss, nosebleeds, postnasal drip, rhinorrhea, sore throat, tinnitus, trouble swallowing and voice change.   Eyes: Negative for pain, discharge, redness, itching and visual disturbance.  Respiratory: Negative for cough, choking, shortness of breath and wheezing.   Cardiovascular: Positive for leg swelling (3+ bilateral lower legs). Negative for chest pain and palpitations.  Gastrointestinal: Positive for constipation. Negative for abdominal distention, abdominal pain, blood in stool, diarrhea, nausea and vomiting.       Diverticulosis coli. AV malformations of the colon. Internal hemorrhoids. History of GI bleed. History of decubitus ulceration in the perineal area. Last colonoscopy 11/06/2015 by Dr. NSilverio Decamp  Endocrine: Negative for cold intolerance, heat intolerance, polydipsia, polyphagia and polyuria.  Genitourinary: Negative for decreased urine volume,  difficulty urinating, dysuria, flank pain, frequency, hematuria, pelvic pain, urgency and vaginal discharge.       History of urinary incontinence. CKD 3.  Musculoskeletal: Positive for arthralgias and gait problem. Negative for back pain, myalgias, neck pain and neck stiffness.       Chronic bursitis right hip.  Skin: Positive for wound (Multiple skin tears). Negative for color change, pallor and rash.       Healed ulcer of the right greater trochanter. Left hip(the great trochanter) pressure ulcer stage II, healing slowly. The mid of right buttock pressure ulcer, stage II, healing.   Allergic/Immunologic: Negative.  Negative for environmental allergies.  Neurological: Negative for dizziness, tremors, seizures, syncope, weakness, numbness and headaches.       Repetitive. Shooting pains down legs  Hematological: Negative for adenopathy. Does not bruise/bleed easily.       Acute blood loss anemia in June 2017 has resolved.  Psychiatric/Behavioral: Negative for agitation, behavioral problems, confusion, dysphoric mood, hallucinations, sleep disturbance and  suicidal ideas. The patient is nervous/anxious. The patient is not hyperactive.     Immunization History  Administered Date(s) Administered  . DTaP 05/13/2005  . Influenza-Unspecified 02/11/2015, 02/22/2016  . Pneumococcal Conjugate-13 05/13/2014  . Pneumococcal Polysaccharide-23 05/13/2014  . Zoster 05/13/2014   Pertinent  Health Maintenance Due  Topic Date Due  . FOOT EXAM  09/12/1935  . OPHTHALMOLOGY EXAM  09/12/1935  . URINE MICROALBUMIN  09/12/1935  . PNA vac Low Risk Adult (2 of 2 - PPSV23) 05/14/2015  . HEMOGLOBIN A1C  09/15/2016  . INFLUENZA VACCINE  12/11/2016  . DEXA SCAN  Completed   Fall Risk  11/03/2015  Falls in the past year? No   Functional Status Survey:    There were no vitals filed for this visit. There is no height or weight on file to calculate BMI. Physical Exam  Constitutional: She is oriented to  person, place, and time. She appears well-developed and well-nourished. No distress.  Elderly and fragile.  HENT:  Right Ear: External ear normal.  Left Ear: External ear normal.  Nose: Nose normal.  Mouth/Throat: Oropharynx is clear and moist. No oropharyngeal exudate.  Eyes: Conjunctivae and EOM are normal. Pupils are equal, round, and reactive to light. No scleral icterus.  Neck: No JVD present. No tracheal deviation present. No thyromegaly present.  Cardiovascular: Normal rate, regular rhythm and normal heart sounds.  Exam reveals no gallop and no friction rub.   No murmur heard. Unable to feel DP and PT bilaterally due to edema.  Pulmonary/Chest: Effort normal. No respiratory distress. She has no wheezes. She has no rales. She exhibits no tenderness.  Abdominal: She exhibits no distension and no mass. There is no tenderness.  Musculoskeletal: Normal range of motion. She exhibits edema (2+ bipedal from the knee to the foot). She exhibits no tenderness.  Tender at the right hip and down the lateral right leg. Using walker with 2 front wheels and rear skids. Very unstable with walking.  Lymphadenopathy:    She has no cervical adenopathy.  Neurological: She is alert and oriented to person, place, and time. No cranial nerve deficit. Coordination abnormal.  Skin: No rash noted. She is not diaphoretic. No erythema. No pallor.  Healed ulcer of the right greater trochanter. Left hip(the great trochanter) pressure ulcer stage II, healing slowly. The mid of right buttock pressure ulcer, stage II, healing.    Psychiatric: She has a normal mood and affect. Her behavior is normal. Judgment and thought content normal.    Labs reviewed:  Recent Labs  11/03/15 2054 11/04/15 0452 11/06/15 0429  12/28/15 03/28/16 07/11/16  NA 139 140 142  < > 140 141 142  K 4.1 3.8 4.0  < > 4.5 4.1 4.0  CL 104 108 112*  --   --   --   --   CO2 25 25 21*  --   --   --   --   GLUCOSE 113* 87 241*  --   --   --    --   BUN 55* 54* 47*  < > 42* 52* 49*  CREATININE 1.62* 1.46* 1.32*  < > 1.2* 1.2* 1.3*  CALCIUM 10.0 9.0 8.5*  --   --   --   --   < > = values in this interval not displayed.  Recent Labs  11/03/15 1226 11/03/15 2054 07/11/16  AST _0 ALT _1 ALKPHOS 44 43 36  BILITOT 0.8 1.2  --  PROT 6.0* 5.7*  --   ALBUMIN 3.6 3.4*  --     Recent Labs  11/03/15 2054  11/05/15 0225 11/05/15 0453 11/06/15 0429 03/18/16 07/11/16  WBC 11.4*  < > 8.5 10.1 10.5 9.1 10.5  NEUTROABS 8.7*  --   --   --   --   --   --   HGB 12.4  < > 9.9* 10.1* 11.0* 12.2 13.6  HCT 36.8  < > 30.1* 30.9* 33.5* 39 43  MCV 85.2  < > 87.2 88.0 86.8  --   --   PLT 133*  < > 125* 115* 139* 248 239  < > = values in this interval not displayed. Lab Results  Component Value Date   TSH 1.94 07/11/2016   Lab Results  Component Value Date   HGBA1C 7.8 03/18/2016   No results found for: CHOL, HDL, LDLCALC, LDLDIRECT, TRIG, CHOLHDL  Significant Diagnostic Results in last 30 days:  No results found.  Assessment/Plan Chronic radicular pain of lower back livable, continue Gbapentin 383m bid, Norco 5/3254m1/2 tid and prn. Observe.   DM (diabetes mellitus), type 2, uncontrolled, with renal complications ([place]/6/2/26etformin 50042mid. 03/18/16 Hgb 7.8  Edema Improved, 1+ BLE, continue Furosemide 50m64md Spironolactone 25mg57mistory of DVT (deep vein thrombosis) Left, 07/26/16 Pharm decrease Eliquis 2.5mg b48m Essential hypertension Controlled, continue Furosemide and Spironolactone. 07/11/16  Na 142, K 4.0, bun 49, creat 1.25, TSH 1.94, wbc 10.4, Hgb 13.6, plt 239  C333nic diastolic congestive heart failure (HCC) Compensated clinically, continue Furosemide 50mg, 41monolactone 25mg. 888m17 BNP 1234.4  Stage II pressure ulcer of left hip Healing slowly, continue Silvadene cream  Pressure ulcer of right buttock, stage 2 Healing   CKD (chronic kidney disease), stage III Creat 1.25  07/11/16     Family/ staff Communication: AL  Labs/tests ordered:  none

## 2016-08-13 NOTE — Assessment & Plan Note (Signed)
Improved, 1+ BLE, continue Furosemide 20mg  and Spironolactone 25mg 

## 2016-08-13 NOTE — Assessment & Plan Note (Signed)
Healing slowly, continue Silvadene cream

## 2016-09-17 ENCOUNTER — Non-Acute Institutional Stay: Payer: Medicare Other | Admitting: Internal Medicine

## 2016-09-17 ENCOUNTER — Encounter: Payer: Self-pay | Admitting: Internal Medicine

## 2016-09-17 VITALS — BP 120/62 | HR 75 | Temp 97.9°F | Ht 63.0 in | Wt 152.0 lb

## 2016-09-17 DIAGNOSIS — I10 Essential (primary) hypertension: Secondary | ICD-10-CM

## 2016-09-17 DIAGNOSIS — M25551 Pain in right hip: Secondary | ICD-10-CM

## 2016-09-17 DIAGNOSIS — N183 Chronic kidney disease, stage 3 unspecified: Secondary | ICD-10-CM

## 2016-09-17 DIAGNOSIS — E1121 Type 2 diabetes mellitus with diabetic nephropathy: Secondary | ICD-10-CM | POA: Diagnosis not present

## 2016-09-17 DIAGNOSIS — I5032 Chronic diastolic (congestive) heart failure: Secondary | ICD-10-CM

## 2016-09-17 DIAGNOSIS — E1165 Type 2 diabetes mellitus with hyperglycemia: Secondary | ICD-10-CM

## 2016-09-17 DIAGNOSIS — L89222 Pressure ulcer of left hip, stage 2: Secondary | ICD-10-CM

## 2016-09-17 DIAGNOSIS — R609 Edema, unspecified: Secondary | ICD-10-CM | POA: Diagnosis not present

## 2016-09-17 DIAGNOSIS — Z86718 Personal history of other venous thrombosis and embolism: Secondary | ICD-10-CM

## 2016-09-17 DIAGNOSIS — IMO0002 Reserved for concepts with insufficient information to code with codable children: Secondary | ICD-10-CM

## 2016-09-17 NOTE — Progress Notes (Signed)
[facility] Room Number: AL23  Place of Service: [facility] (12)     Allergies  Allergen Reactions  . Epinephrine Nausea Only    Unknown.  Marland Kitchen Hydrocodone-Acetaminophen     REACTION: nausea  . Labetalol   . Procaine     Chief Complaint  Patient presents with  . Medical Management of Chronic Issues    blood pressure, hyperglycemia, CKD  . Hip Pain    right, worse in morning  . Medication Management    patient wants to know how long she will be on Eliquis.    HPI:   Essential hypertension - controlled  Right hip pain - worse in the mornings or after laying down for a few hours.Can hardly walk when the pain is bad.  Uncontrolled type 2 diabetes mellitus with diabetic nephropathy, without long-term current use of insulin (HCC) - satisfactory control  Chronic diastolic congestive heart failure ([place]) - compensated  CKD (chronic kidney disease), stage III - stable  History of DVT (deep vein thrombosis) - Occurred June 2018. Anticoaglulated with Eiquis. She would like to stop the oral anticoagulated with Eliquis.  Stage II pressure ulcer of left  - chronic draining tract.  Edema, unspecified type - 2-3 + bilateral, worse on the left.    Medications: Patient's Medications  New Prescriptions   No medications on file  Previous Medications   ACETAMINOPHEN (TYLENOL) 500 MG TABLET    Take 500 mg by mouth. Take one twice a day   AMINO ACIDS-PROTEIN HYDROLYS (FEEDING SUPPLEMENT, PRO-STAT SUGAR FREE 64,) LIQD    Take 30 mLs by mouth. Take 30 ml once daily   APIXABAN (ELIQUIS) 5 MG TABS TABLET    Take 5 mg by mouth. Take one tablet twice daily   CALCIUM CARBONATE (TUMS - DOSED IN MG ELEMENTAL CALCIUM) 500 MG CHEWABLE TABLET    Chew 2 tablets by mouth daily.   FUROSEMIDE (LASIX) 20 MG TABLET    Take 1 tablet (20 mg total) by mouth daily.   GABAPENTIN (NEURONTIN) 100 MG CAPSULE    Take 3 (3104m) twice daily   HYDROCODONE-ACETAMINOPHEN (NORCO/VICODIN) 5-325 MG  TABLET    Take by mouth every 6 (six) hours as needed for moderate pain. 1/2 tablet three times daily for pain   METFORMIN (GLUCOPHAGE) 500 MG TABLET    Take 500 mg by mouth 2 (two) times daily with a meal.   MULTIPLE VITAMINS-MINERALS (ICAPS MV) TABS    Take 1 tablet by mouth daily.   MULTIPLE VITAMINS-MINERALS (MULTIVITAMIN WITH MINERALS) TABLET    Take 1 tablet by mouth. Take one tablet once daily   SILVER SULFADIAZINE (SILVADENE) 1 % CREAM    Apply 1 application topically. Apply cream twice daily to left hip wound   SIMVASTATIN (ZOCOR) 10 MG TABLET    Take 10 mg by mouth daily.   SPIRONOLACTONE (ALDACTONE) 25 MG TABLET    Take 25 mg by mouth. Take one tablet daily  Modified Medications   No medications on file  Discontinued Medications   No medications on file     Review of Systems  Constitutional: Positive for fatigue. Negative for activity change, appetite change, chills, diaphoresis, fever and unexpected weight change.  HENT: Negative for congestion, ear discharge, ear pain, hearing loss, nosebleeds, postnasal drip, rhinorrhea, sore throat, tinnitus, trouble swallowing and voice change.   Eyes: Negative for pain, discharge, redness, itching and visual disturbance.  Respiratory: Negative for cough, choking, shortness of breath and wheezing.  Cardiovascular: Positive for leg swelling (3+ bilateral lower legs). Negative for chest pain and palpitations.  Gastrointestinal: Positive for constipation. Negative for abdominal distention, abdominal pain, blood in stool, diarrhea, nausea and vomiting.       Diverticulosis coli. AV malformations of the colon. Internal hemorrhoids. History of GI bleed. History of decubitus ulceration in the perineal area. Last colonoscopy 11/06/2015 by Dr. Silverio Decamp.  Endocrine: Negative for cold intolerance, heat intolerance, polydipsia, polyphagia and polyuria.  Genitourinary: Negative for decreased urine volume, difficulty urinating, dysuria, flank pain,  frequency, hematuria, pelvic pain, urgency and vaginal discharge.       History of urinary incontinence. CKD 3.  Musculoskeletal: Positive for arthralgias and gait problem. Negative for back pain, myalgias, neck pain and neck stiffness.       Chronic bursitis right hip. Pain in the right hip.  Skin: Positive for wound (Multiple skin tears). Negative for color change, pallor and rash.       Healed ulcer of the right greater trochanter. Left hip(the great trochanter) pressure ulcer stage II, healing slowly. The mid of right buttock pressure ulcer, stage II, healing.   Allergic/Immunologic: Negative.  Negative for environmental allergies.  Neurological: Negative for dizziness, tremors, seizures, syncope, weakness, numbness and headaches.       Repetitive. Shooting pains down legs  Hematological: Negative for adenopathy. Does not bruise/bleed easily.       Acute blood loss anemia in June 2017 has resolved.  Psychiatric/Behavioral: Negative for agitation, behavioral problems, confusion, dysphoric mood, hallucinations, sleep disturbance and suicidal ideas. The patient is nervous/anxious. The patient is not hyperactive.     Vitals:   09/17/16 0932  BP: 120/62  Pulse: 75  Temp: 97.9 F (36.6 C)  TempSrc: Oral  SpO2: 96%  Weight: 152 lb (68.9 kg)  Height: '5\' 3"'  (1.6 m)   Wt Readings from Last 3 Encounters:  09/17/16 152 lb (68.9 kg)  08/13/16 150 lb 3.2 oz (68.1 kg)  07/26/16 149 lb (67.6 kg)    Body mass index is 26.93 kg/m.  Physical Exam  Constitutional: She is oriented to person, place, and time. She appears well-developed and well-nourished. No distress.  Elderly and fragile.  HENT:  Right Ear: External ear normal.  Left Ear: External ear normal.  Nose: Nose normal.  Mouth/Throat: Oropharynx is clear and moist. No oropharyngeal exudate.  Eyes: Conjunctivae and EOM are normal. Pupils are equal, round, and reactive to light. No scleral icterus.  Neck: No JVD present. No  tracheal deviation present. No thyromegaly present.  Cardiovascular: Normal rate, regular rhythm and normal heart sounds.  Exam reveals no gallop and no friction rub.   No murmur heard. Unable to feel DP and PT bilaterally due to edema.  Pulmonary/Chest: Effort normal. No respiratory distress. She has no wheezes. She has no rales. She exhibits no tenderness.  Abdominal: She exhibits no distension and no mass. There is no tenderness.  Musculoskeletal: Normal range of motion. She exhibits edema (2+ bipedal from the knee to the foot). She exhibits no tenderness.  Tender at the right hip and down the lateral right leg. Using walker with 2 front wheels and rear skids. Very unstable with walking.  Lymphadenopathy:    She has no cervical adenopathy.  Neurological: She is alert and oriented to person, place, and time. No cranial nerve deficit. Coordination abnormal.  Skin: No rash noted. She is not diaphoretic. No erythema. No pallor.  Healed ulcer of the right greater trochanter. Left hip(the great trochanter) pressure ulcer stage  II, healing slowly. The mid of right buttock pressure ulcer, stage II, healing.    Psychiatric: She has a normal mood and affect. Her behavior is normal. Judgment and thought content normal.     Labs reviewed: Lab Summary Latest Ref Rng & Units 07/11/2016 03/28/2016 03/18/2016 12/28/2015 12/21/2015 11/06/2015  Hemoglobin 12.0 - 16.0 g/dL 13.6 (None) 12.2 (None) (None) 11.0(L)  Hematocrit 36 - 46 % 43 (None) 39 (None) (None) 33.5(L)  White count 10:3/mL 10.5 (None) 9.1 (None) (None) 10.5  Platelet count 150 - 399 K/L 239 (None) 248 (None) (None) 139(L)  Sodium 137 - 147 mmol/L 142 141 (None) 140 143 142  Potassium 3.4 - 5.3 mmol/L 4.0 4.1 (None) 4.5 5.1 4.0  Calcium 8.9 - 10.3 mg/dL (None) (None) (None) (None) (None) 8.5(L)  Phosphorus - (None) (None) (None) (None) (None) (None)  Creatinine 0.5 - 1.1 mg/dL 1.3(A) 1.2(A) (None) 1.2(A) 1.3(A) 1.32(H)  AST 13 - 35 U/L 21  (None) (None) (None) (None) (None)  Alk Phos 25 - 125 U/L 36 (None) (None) (None) (None) (None)  Bilirubin - (None) (None) (None) (None) (None) (None)  Glucose mg/dL 91 133 (None) 118 147 241(H)  Cholesterol - (None) (None) (None) (None) (None) (None)  HDL cholesterol - (None) (None) (None) (None) (None) (None)  Triglycerides - (None) (None) (None) (None) (None) (None)  LDL Direct - (None) (None) (None) (None) (None) (None)  LDL Calc - (None) (None) (None) (None) (None) (None)  Total protein - (None) (None) (None) (None) (None) (None)  Albumin - (None) (None) (None) (None) (None) (None)  Some recent data might be hidden   Lab Results  Component Value Date   TSH 1.94 07/11/2016   Lab Results  Component Value Date   BUN 49 (A) 07/11/2016   BUN 52 (A) 03/28/2016   BUN 42 (A) 12/28/2015   Lab Results  Component Value Date   CREATININE 1.3 (A) 07/11/2016   CREATININE 1.2 (A) 03/28/2016   CREATININE 1.2 (A) 12/28/2015   Lab Results  Component Value Date   HGBA1C 7.8 03/18/2016       Assessment/Plan  1. Right hip pain - refer to Ortho  2. Essential hypertension The current medical regimen is effective;  continue present plan and medications.  3. Uncontrolled type 2 diabetes mellitus with diabetic nephropathy, without long-term current use of insulin (HCC) continue metformin  4. Chronic diastolic congestive heart failure ([place]) Compensated  5. CKD (chronic kidney disease), stage III stable  6. History of DVT (deep vein thrombosis) It is 1 year since her DVT. Stop Eliquis. Start compression stockings, 15-20 ml compression at knee height,  7. Stage II pressure ulcer of left hip Chronic draining area. Likely a fistulous tract.  8. Edema, unspecified type Chronic venous insufficiency. Wear compression stackings and DSC the Eliquis.

## 2016-09-19 DIAGNOSIS — R7309 Other abnormal glucose: Secondary | ICD-10-CM | POA: Diagnosis not present

## 2016-09-20 DIAGNOSIS — L602 Onychogryphosis: Secondary | ICD-10-CM | POA: Diagnosis not present

## 2016-09-20 DIAGNOSIS — R7309 Other abnormal glucose: Secondary | ICD-10-CM | POA: Diagnosis not present

## 2016-09-20 DIAGNOSIS — M79671 Pain in right foot: Secondary | ICD-10-CM | POA: Diagnosis not present

## 2016-09-20 DIAGNOSIS — M79672 Pain in left foot: Secondary | ICD-10-CM | POA: Diagnosis not present

## 2016-09-20 LAB — HEMOGLOBIN A1C: Hemoglobin A1C: 6.6

## 2016-09-24 ENCOUNTER — Other Ambulatory Visit: Payer: Self-pay | Admitting: *Deleted

## 2016-09-24 ENCOUNTER — Encounter: Payer: Self-pay | Admitting: Internal Medicine

## 2016-09-25 DIAGNOSIS — M47816 Spondylosis without myelopathy or radiculopathy, lumbar region: Secondary | ICD-10-CM | POA: Diagnosis not present

## 2016-09-25 DIAGNOSIS — M5441 Lumbago with sciatica, right side: Secondary | ICD-10-CM | POA: Diagnosis not present

## 2016-09-25 DIAGNOSIS — G8929 Other chronic pain: Secondary | ICD-10-CM | POA: Diagnosis not present

## 2016-09-25 DIAGNOSIS — M5442 Lumbago with sciatica, left side: Secondary | ICD-10-CM | POA: Diagnosis not present

## 2016-10-10 DIAGNOSIS — M5441 Lumbago with sciatica, right side: Secondary | ICD-10-CM | POA: Diagnosis not present

## 2016-10-10 DIAGNOSIS — M5442 Lumbago with sciatica, left side: Secondary | ICD-10-CM | POA: Diagnosis not present

## 2016-10-10 DIAGNOSIS — G8929 Other chronic pain: Secondary | ICD-10-CM | POA: Diagnosis not present

## 2016-10-10 DIAGNOSIS — M5136 Other intervertebral disc degeneration, lumbar region: Secondary | ICD-10-CM | POA: Diagnosis not present

## 2016-10-11 ENCOUNTER — Encounter: Payer: Self-pay | Admitting: Nurse Practitioner

## 2016-10-11 ENCOUNTER — Non-Acute Institutional Stay: Payer: Medicare Other | Admitting: Nurse Practitioner

## 2016-10-11 DIAGNOSIS — E1165 Type 2 diabetes mellitus with hyperglycemia: Secondary | ICD-10-CM | POA: Diagnosis not present

## 2016-10-11 DIAGNOSIS — E1121 Type 2 diabetes mellitus with diabetic nephropathy: Secondary | ICD-10-CM | POA: Diagnosis not present

## 2016-10-11 DIAGNOSIS — IMO0002 Reserved for concepts with insufficient information to code with codable children: Secondary | ICD-10-CM

## 2016-10-11 DIAGNOSIS — R609 Edema, unspecified: Secondary | ICD-10-CM

## 2016-10-11 DIAGNOSIS — L89222 Pressure ulcer of left hip, stage 2: Secondary | ICD-10-CM | POA: Diagnosis not present

## 2016-10-11 DIAGNOSIS — M5416 Radiculopathy, lumbar region: Secondary | ICD-10-CM

## 2016-10-11 DIAGNOSIS — M25551 Pain in right hip: Secondary | ICD-10-CM

## 2016-10-11 DIAGNOSIS — L89619 Pressure ulcer of right heel, unspecified stage: Secondary | ICD-10-CM | POA: Diagnosis not present

## 2016-10-11 DIAGNOSIS — G8929 Other chronic pain: Secondary | ICD-10-CM

## 2016-10-11 DIAGNOSIS — I5032 Chronic diastolic (congestive) heart failure: Secondary | ICD-10-CM

## 2016-10-11 DIAGNOSIS — N183 Chronic kidney disease, stage 3 unspecified: Secondary | ICD-10-CM

## 2016-10-11 DIAGNOSIS — I10 Essential (primary) hypertension: Secondary | ICD-10-CM | POA: Diagnosis not present

## 2016-10-11 NOTE — Progress Notes (Signed)
Location:  [place] Room Number: 23 Place of Service:  ALF [phone]) Provider:  Imaan Padgett, [place]xie  NP   Estill Dooms, MD  Patient Care Team: Estill Dooms, MD as PCP - General (Internal Medicine) Arcadia Gorgas X, NP as Nurse Practitioner (Internal Medicine) Rutherford Guys, MD as Consulting Physician (Ophthalmology)  Extended Emergency Contact Information Primary Emergency Contact: Nath,Leonard Address: [address] [place] [place] [address] Johnnette Litter of [place] Phone: [phone] Work Phone: [phone] Mobile Phone: [phone] Relation: Son Secondary Emergency Contact: [place] of [place] Phone: [phone] Relation: Friend  Code Status:  DNR Goals of care: Advanced Directive information Advanced Directives 10/11/2016  Does Patient Have a Medical Advance Directive? Yes  Type of Paramedic of [place];Living will  Does patient want to make changes to medical advance directive? No - Patient declined  Copy of [place] in Chart? Yes  Would patient like information on creating a medical advance directive? -  Pre-existing out of facility DNR order (yellow form or pink MOST form) Yellow form placed in chart (order not valid for inpatient use)     Chief Complaint  Patient presents with  . Acute Visit    (rt) heel,anterior,scabbed blister, swollen and dark pink     HPI:  Pt is a 83 y.o. female seen today for an acute visit for the right medial heel scabbed over blister, mild swelling and peri wound erythema, no s/s of infection, pressure reduction is warranted, close monitoring needed.   Hx lower back pain, radiating to legs, worsens with movement, better with Gabapentin 347m bid, Norco 1/2 tab TID and 5/3233mq6h prn.  Hx of BLE edema, improved on Furosemide 2056mSpironolactone 81m35mily. 12/16/15 Hgb a1c 9.5, 09/21/16 Hgb a1c 6.6, takign  Metformin 500mg90m. Hx  of DVT LLE, taking Eliquis 2.5mg b48m Hgb 13.6 07/11/16, R hip bursitis, pending joint inj per the patient. Left hip pressure ulcer healing slowly, not infected   Past Medical History:  Diagnosis Date  . Acute blood loss anemia   . Allergy   . Asthma   . AVM (arteriovenous malformation) of colon with hemorrhage   . Bursitis of right hip   . Cataract   . CKD (chronic kidney disease), stage III 11/04/2015  . COLONIC POLYPS 03/26/2005   Qualifier: Diagnosis of  By: McMurrTalbert CageAAMA),[place]    . DIVERTICULOSIS, COLON 03/26/2005   Qualifier: Diagnosis of  By: McMurrTalbert CageAAMA),[place]    . DVT (deep venous thrombosis), left 11/03/2015  . Ecchymosis 12/16/2015  . Hyperglycemia 12/16/2015  . Hyperlipidemia   . Hypertension   . Multiple skin tears 12/16/2015  . Osteopenia   . Stasis edema of both lower extremities 11/04/2015  . Unstable gait 12/16/2015  . Urine incontinence 12/16/2015   Past Surgical History:  Procedure Laterality Date  . ABDOMINAL HYSTERECTOMY  2006   Dr. Wes DaMaryelizabeth RowanTARACT EXTRACTION W/ INTRAOCULAR LENS IMPLANT Bilateral 1999/2003   Dr. Mark SRutherford GuysLONOSCOPY N/A 11/06/2015   Procedure: COLONOSCOPY;  Surgeon: KavithMauri Pole Location: [facility];  Service: Endoscopy;  Laterality: N/A;  MAC if available, otherwise moderate sedation    Allergies  Allergen Reactions  . Epinephrine Nausea Only    Unknown.  . HydrMarland Kitchencodone-Acetaminophen     REACTION: nausea  . Labetalol   . Procaine     Outpatient Encounter  Prescriptions as of 10/11/2016  Medication Sig  . acetaminophen (TYLENOL) 500 MG tablet Take 500 mg by mouth. Take one twice a day  . Amino Acids-Protein Hydrolys (FEEDING SUPPLEMENT, PRO-STAT SUGAR FREE 64,) LIQD Take 30 mLs by mouth. Take 30 ml once daily  . calcium carbonate (TUMS - DOSED IN MG ELEMENTAL CALCIUM) 500 MG chewable tablet Chew 2 tablets by mouth daily.  . furosemide (LASIX) 20 MG tablet Take 1 tablet (20 mg total) by mouth daily.  Marland Kitchen  gabapentin (NEURONTIN) 100 MG capsule Take 3 (345m) twice daily  . HYDROcodone-acetaminophen (NORCO/VICODIN) 5-325 MG tablet Take by mouth every 6 (six) hours as needed for moderate pain. 1/2 tablet three times daily for pain  . metFORMIN (GLUCOPHAGE) 500 MG tablet Take 500 mg by mouth 2 (two) times daily with a meal.  . Multiple Vitamins-Minerals (ICAPS MV) TABS Take 1 tablet by mouth daily.  . Multiple Vitamins-Minerals (MULTIVITAMIN WITH MINERALS) tablet Take 1 tablet by mouth. Take one tablet once daily  . silver sulfADIAZINE (SILVADENE) 1 % cream Apply 1 application topically. Apply cream twice daily to left hip wound  . simvastatin (ZOCOR) 10 MG tablet Take 10 mg by mouth daily.  .Marland Kitchenspironolactone (ALDACTONE) 25 MG tablet Take 25 mg by mouth. Take one tablet daily  . [DISCONTINUED] apixaban (ELIQUIS) 5 MG TABS tablet Take 5 mg by mouth. Take one tablet twice daily   No facility-administered encounter medications on file as of 10/11/2016.     Review of Systems  Constitutional: Positive for fatigue. Negative for activity change, appetite change, chills, diaphoresis, fever and unexpected weight change.  HENT: Negative for congestion, ear discharge, ear pain, hearing loss, nosebleeds, postnasal drip, rhinorrhea, sore throat, tinnitus, trouble swallowing and voice change.   Eyes: Negative for pain, discharge, redness, itching and visual disturbance.  Respiratory: Negative for cough, choking, shortness of breath and wheezing.   Cardiovascular: Positive for leg swelling (3+ bilateral lower legs). Negative for chest pain and palpitations.  Gastrointestinal: Positive for constipation. Negative for abdominal distention, abdominal pain, blood in stool, diarrhea, nausea and vomiting.       Diverticulosis coli. AV malformations of the colon. Internal hemorrhoids. History of GI bleed. History of decubitus ulceration in the perineal area. Last colonoscopy 11/06/2015 by Dr. NSilverio Decamp  Endocrine: Negative  for cold intolerance, heat intolerance, polydipsia, polyphagia and polyuria.  Genitourinary: Negative for decreased urine volume, difficulty urinating, dysuria, flank pain, frequency, hematuria, pelvic pain, urgency and vaginal discharge.       History of urinary incontinence. CKD 3.  Musculoskeletal: Positive for arthralgias and gait problem. Negative for back pain, myalgias, neck pain and neck stiffness.       Chronic bursitis right hip. Pain in the right hip.  Skin: Positive for wound (Multiple skin tears). Negative for color change, pallor and rash.       Healed ulcer of the right greater trochanter. Left hip(the great trochanter) pressure ulcer stage II, healing slowly. The mid of right buttock pressure ulcer, stage II, healing.   the right heel scabbed over blister, mild swelling and peri wound erythema, no s/s of infection, pressure reduction is warranted, close monitoring needed.    Allergic/Immunologic: Negative.  Negative for environmental allergies.  Neurological: Negative for dizziness, tremors, seizures, syncope, weakness, numbness and headaches.       Repetitive. Shooting pains down legs  Hematological: Negative for adenopathy. Does not bruise/bleed easily.       Acute blood loss anemia in June 2017 has resolved.  Psychiatric/Behavioral:  Negative for agitation, behavioral problems, confusion, dysphoric mood, hallucinations, sleep disturbance and suicidal ideas. The patient is nervous/anxious. The patient is not hyperactive.     Immunization History  Administered Date(s) Administered  . DTaP 05/13/2005  . Influenza-Unspecified 02/11/2015, 02/22/2016  . Pneumococcal Conjugate-13 05/13/2014  . Pneumococcal Polysaccharide-23 05/13/2014  . Zoster 05/13/2014   Pertinent  Health Maintenance Due  Topic Date Due  . FOOT EXAM  09/12/1935  . OPHTHALMOLOGY EXAM  09/12/1935  . URINE MICROALBUMIN  09/12/1935  . PNA vac Low Risk Adult (2 of 2 - PPSV23) 05/14/2015  . INFLUENZA VACCINE   12/11/2016  . HEMOGLOBIN A1C  03/23/2017  . DEXA SCAN  Completed   Fall Risk  11/03/2015  Falls in the past year? No   Functional Status Survey:    Vitals:   10/11/16 1303  BP: (!) 112/56  Pulse: 68  Resp: 20  Temp: 97.6 F (36.4 C)  SpO2: 96%  Weight: 149 lb (67.6 kg)  Height: _0  (1.6 m)   Body mass index is 26.39 kg/m. Physical Exam  Constitutional: She is oriented to person, place, and time. She appears well-developed and well-nourished. No distress.  Elderly and fragile.  HENT:  Right Ear: External ear normal.  Left Ear: External ear normal.  Nose: Nose normal.  Mouth/Throat: Oropharynx is clear and moist. No oropharyngeal exudate.  Eyes: Conjunctivae and EOM are normal. Pupils are equal, round, and reactive to light. No scleral icterus.  Neck: No JVD present. No tracheal deviation present. No thyromegaly present.  Cardiovascular: Normal rate, regular rhythm and normal heart sounds.  Exam reveals no gallop and no friction rub.   No murmur heard. Unable to feel DP and PT bilaterally due to edema.  Pulmonary/Chest: Effort normal. No respiratory distress. She has no wheezes. She has no rales. She exhibits no tenderness.  Abdominal: She exhibits no distension and no mass. There is no tenderness.  Musculoskeletal: Normal range of motion. She exhibits edema (2+ bipedal from the knee to the foot). She exhibits no tenderness.  Tender at the right hip and down the lateral right leg. Using walker with 2 front wheels and rear skids. Very unstable with walking.  Lymphadenopathy:    She has no cervical adenopathy.  Neurological: She is alert and oriented to person, place, and time. No cranial nerve deficit. Coordination abnormal.  Skin: No rash noted. She is not diaphoretic. No erythema. No pallor.  Healed ulcer of the right greater trochanter. Left hip(the great trochanter) pressure ulcer stage II, healing slowly. The mid of right buttock pressure ulcer, stage II, healing.    the right heel scabbed over blister, mild swelling and peri wound erythema, no s/s of infection, pressure reduction is warranted, close monitoring needed.     Psychiatric: She has a normal mood and affect. Her behavior is normal. Judgment and thought content normal.    Labs reviewed:  Recent Labs  11/03/15 2054 11/04/15 0452 11/06/15 0429  12/28/15 03/28/16 07/11/16  NA 139 140 142  < > 140 141 142  K 4.1 3.8 4.0  < > 4.5 4.1 4.0  CL 104 108 112*  --   --   --   --   CO2 25 25 21*  --   --   --   --   GLUCOSE 113* 87 241*  --   --   --   --   BUN 55* 54* 47*  < > 42* 52* 49*  CREATININE 1.62* 1.46* 1.32*  < >  1.2* 1.2* 1.3*  CALCIUM 10.0 9.0 8.5*  --   --   --   --   < > = values in this interval not displayed.  Recent Labs  11/03/15 1226 11/03/15 2054 07/11/16  AST _0 ALT _1 ALKPHOS 44 43 36  BILITOT 0.8 1.2  --   PROT 6.0* 5.7*  --   ALBUMIN 3.6 3.4*  --     Recent Labs  11/03/15 2054  11/05/15 0225 11/05/15 0453 11/06/15 0429 03/18/16 07/11/16  WBC 11.4*  < > 8.5 10.1 10.5 9.1 10.5  NEUTROABS 8.7*  --   --   --   --   --   --   HGB 12.4  < > 9.9* 10.1* 11.0* 12.2 13.6  HCT 36.8  < > 30.1* 30.9* 33.5* 39 43  MCV 85.2  < > 87.2 88.0 86.8  --   --   PLT 133*  < > 125* 115* 139* 248 239  < > = values in this interval not displayed. Lab Results  Component Value Date   TSH 1.94 07/11/2016   Lab Results  Component Value Date   HGBA1C 6.6 09/20/2016   No results found for: CHOL, HDL, LDLCALC, LDLDIRECT, TRIG, CHOLHDL  Significant Diagnostic Results in last 30 days:  No results found.  Assessment/Plan Decubitus ulcer of right heel 10/11/16  The medail  right heel scabbed over blister, mild swelling and peri wound erythema, no s/s of infection, pressure reduction is warranted, close monitoring needed.    Essential hypertension Controlled, continue Furosemide and Spironolactone. 07/11/16  Na 142, K 4.0, bun 49, creat 1.25, TSH 1.94, wbc 10.4, Hgb  13.6, plt 491  Chronic diastolic congestive heart failure (HCC) Compensated clinically, continue Furosemide 4m, Spironolactone 245m 12/21/15 BNP 1234.4  DM (diabetes mellitus), type 2, uncontrolled, with renal complications (HCRiverside8/7/9/15etformin 5003mid. 03/18/16 Hgb 7.8, 09/21/16 Hgb a1c 6.6  Chronic radicular pain of lower back livable, continue Gbapentin 300m14md, Norco 5/325mg61m tid and prn. Observe.   Stage II pressure ulcer of left hip Healing slowly, continue Silvadene cream  CKD (chronic kidney disease), stage III 07/11/16 creat 1.25  Edema Improved, chronic 1+ BLE, continue Furosemide 20mg 7mSpironolactone 25mg  14mt hip pain S/p Ortho, pending joint inj per the patient.      Family/ staff Communication: AL  Labs/tests ordered: none

## 2016-10-11 NOTE — Assessment & Plan Note (Signed)
Compensated clinically, continue Furosemide 20mg , Spironolactone 25mg . 12/21/15 BNP 1234.4

## 2016-10-11 NOTE — Assessment & Plan Note (Signed)
12/19/15 Metformin 500mg  bid. 03/18/16 Hgb 7.8, 09/21/16 Hgb a1c 6.6

## 2016-10-11 NOTE — Assessment & Plan Note (Signed)
Improved, chronic 1+ BLE, continue Furosemide 20mg  and Spironolactone 25mg 

## 2016-10-11 NOTE — Assessment & Plan Note (Addendum)
10/11/16  The medail  right heel scabbed over blister, mild swelling and peri wound erythema, no s/s of infection, pressure reduction is warranted, close monitoring needed.

## 2016-10-11 NOTE — Assessment & Plan Note (Signed)
Healing slowly, continue Silvadene cream

## 2016-10-11 NOTE — Assessment & Plan Note (Signed)
07/11/16 creat 1.25

## 2016-10-11 NOTE — Assessment & Plan Note (Signed)
S/p Ortho, pending joint inj per the patient.

## 2016-10-11 NOTE — Assessment & Plan Note (Signed)
Controlled, continue Furosemide and Spironolactone. 07/11/16  Na 142, K 4.0, bun 49, creat 1.25, TSH 1.94, wbc 10.4, Hgb 13.6, plt 239

## 2016-10-11 NOTE — Assessment & Plan Note (Signed)
livable, continue Gbapentin 300mg  bid, Norco 5/325mg  1/2 tid and prn. Observe.

## 2016-10-14 ENCOUNTER — Encounter: Payer: Self-pay | Admitting: Internal Medicine

## 2016-10-24 ENCOUNTER — Encounter: Payer: Self-pay | Admitting: Internal Medicine

## 2016-10-30 DIAGNOSIS — M5441 Lumbago with sciatica, right side: Secondary | ICD-10-CM | POA: Diagnosis not present

## 2016-10-30 DIAGNOSIS — M5136 Other intervertebral disc degeneration, lumbar region: Secondary | ICD-10-CM | POA: Diagnosis not present

## 2016-11-05 ENCOUNTER — Non-Acute Institutional Stay: Payer: Medicare Other | Admitting: Family

## 2016-11-05 ENCOUNTER — Encounter: Payer: Self-pay | Admitting: Family

## 2016-11-05 DIAGNOSIS — M26629 Arthralgia of temporomandibular joint, unspecified side: Secondary | ICD-10-CM

## 2016-11-05 DIAGNOSIS — G8929 Other chronic pain: Secondary | ICD-10-CM

## 2016-11-06 NOTE — Progress Notes (Signed)
Location:  [facility] Room Number: 23 Place of Service:  ALF [phone]) Provider: Dinah Ngetich FNP-C  Blanchie Serve, MD  Patient Care Team: Blanchie Serve, MD as PCP - General (Internal Medicine) Rutherford Guys, MD as Consulting Physician (Ophthalmology) Ngetich, Nelda Bucks, NP as Nurse Practitioner [facility])  Extended Emergency Contact Information Primary Emergency Contact: Beller,Leonard Address: [address] [place] [address] Johnnette Litter of [place] Phone: [phone] Work Phone: [phone] Mobile Phone: [phone] Relation: Son Secondary Emergency Contact: [place] of [place] Phone: [phone] Relation: Friend  Code Status: DNR  Goals of care: Advanced Directive information Advanced Directives 11/05/2016  Does Patient Have a Medical Advance Directive? Yes  Type of Advance Directive Out of facility DNR (pink MOST or yellow form);Living will;Healthcare Power of Attorney  Does patient want to make changes to medical advance directive? -  Copy of [place] in Chart? Yes  Would patient like information on creating a medical advance directive? -  Pre-existing out of facility DNR order (yellow form or pink MOST form) Yellow form placed in chart (order not valid for inpatient use);Pink MOST form placed in chart (order not valid for inpatient use)     Chief Complaint  Patient presents with  . Acute Visit    occasional facial redness and pain     HPI:  Pt is a 83 y.o. female seen today at [facility]  ALF for an acute visit for evaluation of jaw pain. She is seen in her room today per facility Nurse supervisor request who states patient's son requested evaluation of patient's jaw dur to occasional jaw pain. Patient states has occasional jaw pain whenever she opens her mouth wide.she denies any pain during the visit. Pain usually occurs occasional " about six months" then  goes away.she denies any fever,chills, cough,ear pain,spasm or locking of jaw. She has not taken pain medication for jaw pain.    Past Medical History:  Diagnosis Date  . Acute blood loss anemia   . Allergy   . Asthma   . AVM (arteriovenous malformation) of colon with hemorrhage   . Bursitis of right hip   . Cataract   . CKD (chronic kidney disease), stage III 11/04/2015  . COLONIC POLYPS 03/26/2005   Qualifier: Diagnosis of  By: Talbert Cage CMA ([place]), [place]    . DIVERTICULOSIS, COLON 03/26/2005   Qualifier: Diagnosis of  By: Talbert Cage CMA ([place]), [place]    . DVT (deep venous thrombosis), left 11/03/2015  . Ecchymosis 12/16/2015  . Hyperglycemia 12/16/2015  . Hyperlipidemia   . Hypertension   . Multiple skin tears 12/16/2015  . Osteopenia   . Stasis edema of both lower extremities 11/04/2015  . Unstable gait 12/16/2015  . Urine incontinence 12/16/2015   Past Surgical History:  Procedure Laterality Date  . ABDOMINAL HYSTERECTOMY  2006   Dr. Maryelizabeth Rowan  . CATARACT EXTRACTION W/ INTRAOCULAR LENS IMPLANT Bilateral 1999/2003   Dr. Rutherford Guys  . COLONOSCOPY N/A 11/06/2015   Procedure: COLONOSCOPY;  Surgeon: Mauri Pole, MD;  Location: [facility];  Service: Endoscopy;  Laterality: N/A;  MAC if available, otherwise moderate sedation    Allergies  Allergen Reactions  . Epinephrine Nausea Only    Unknown.  Marland Kitchen Hydrocodone-Acetaminophen     REACTION: nausea  . Labetalol   . Procaine     Allergies as of 11/05/2016      Reactions  Epinephrine Nausea Only   Unknown.   Hydrocodone-acetaminophen    REACTION: nausea   Labetalol    Procaine       Medication List       Accurate as of 11/05/16 11:59 PM. Always use your most recent med list.          acetaminophen 500 MG tablet Commonly known as:  TYLENOL Take 500 mg by mouth. Take one twice a day   calcium carbonate 500 MG chewable tablet Commonly known as:  TUMS - dosed in mg elemental calcium Chew 2 tablets by mouth daily.     feeding supplement (PRO-STAT SUGAR FREE 64) Liqd Take 30 mLs by mouth. Take 30 ml once daily   furosemide 20 MG tablet Commonly known as:  LASIX Take 1 tablet (20 mg total) by mouth daily.   gabapentin 100 MG capsule Commonly known as:  NEURONTIN Take 3 (322m) twice daily   HYDROcodone-acetaminophen 5-325 MG tablet Commonly known as:  NORCO/VICODIN Take 0.5 tablets by mouth 3 (three) times daily. 1/2 tablet three times daily for pain   ICAPS MV Tabs Take 1 tablet by mouth daily.   multivitamin with minerals tablet Take 1 tablet by mouth. Take one tablet once daily   metFORMIN 500 MG tablet Commonly known as:  GLUCOPHAGE Take 500 mg by mouth daily with breakfast.   silver sulfADIAZINE 1 % cream Commonly known as:  SILVADENE Apply 1 application topically. Apply cream twice daily to left hip wound   simvastatin 10 MG tablet Commonly known as:  ZOCOR Take 10 mg by mouth daily.   spironolactone 25 MG tablet Commonly known as:  ALDACTONE Take 25 mg by mouth. Take one tablet daily       Review of Systems  Constitutional: Negative for activity change, appetite change, chills, fatigue and fever.  HENT: Negative for congestion, dental problem, ear discharge, ear pain, facial swelling, postnasal drip, rhinorrhea, sinus pain, sinus pressure, sneezing, sore throat and tinnitus.        Occasional Jaw pain   Eyes: Negative.   Respiratory: Negative for cough, chest tightness, shortness of breath and wheezing.   Cardiovascular: Negative for chest pain, palpitations and leg swelling.  Gastrointestinal: Negative for abdominal distention, abdominal pain, constipation, diarrhea, nausea and vomiting.  Genitourinary: Negative for dysuria, flank pain, frequency and urgency.  Musculoskeletal: Positive for gait problem.  Skin: Negative for color change, pallor and rash.  Neurological: Negative for dizziness, seizures, syncope, light-headedness and headaches.  Hematological: Does not  bruise/bleed easily.  Psychiatric/Behavioral: Negative for agitation, confusion, hallucinations and sleep disturbance. The patient is not nervous/anxious.     Immunization History  Administered Date(s) Administered  . DTaP 05/13/2005  . Influenza-Unspecified 02/11/2015, 02/22/2016  . PPD Test 12/08/2015, 12/22/2015  . Pneumococcal Conjugate-13 05/13/2014  . Pneumococcal Polysaccharide-23 05/13/2014  . Zoster 05/13/2014   Pertinent  Health Maintenance Due  Topic Date Due  . FOOT EXAM  09/12/1935  . OPHTHALMOLOGY EXAM  09/12/1935  . URINE MICROALBUMIN  09/12/1935  . PNA vac Low Risk Adult (2 of 2 - PPSV23) 05/14/2015  . INFLUENZA VACCINE  12/11/2016  . HEMOGLOBIN A1C  03/23/2017  . DEXA SCAN  Completed   Fall Risk  11/03/2015  Falls in the past year? No    Vitals:   11/05/16 1146  BP: 120/67  Pulse: 78  Resp: 18  Temp: 97.7 F (36.5 C)  SpO2: 98%  Weight: 144 lb 6.4 oz (65.5 kg)  Height: _0  (1.6 m)  Body mass index is 25.58 kg/m. Physical Exam  Constitutional: She is oriented to person, place, and time. She appears well-developed and well-nourished. No distress.  HENT:  Head: Normocephalic.  Right Ear: External ear normal.  Left Ear: External ear normal.  Mouth/Throat: Oropharynx is clear and moist. No oropharyngeal exudate.  TMJ negative for swelling, tenderness or popping.   Eyes: Conjunctivae and EOM are normal. Pupils are equal, round, and reactive to light. Right eye exhibits no discharge. Left eye exhibits no discharge. No scleral icterus.  Neck: Normal range of motion. No JVD present. No thyromegaly present.  Cardiovascular: Normal rate, regular rhythm, normal heart sounds and intact distal pulses.  Exam reveals no gallop and no friction rub.   No murmur heard. Pulmonary/Chest: Effort normal and breath sounds normal. No respiratory distress. She has no wheezes. She has no rales.  Abdominal: Soft. Bowel sounds are normal. She exhibits no distension. There  is no tenderness. There is no rebound and no guarding.  Musculoskeletal: She exhibits no edema, tenderness or deformity.  Moves x extremities.Unsteady gait uses FWW  Lymphadenopathy:    She has no cervical adenopathy.  Neurological: She is oriented to person, place, and time.  Skin: Skin is warm and dry. No rash noted. No erythema. No pallor.  Psychiatric: She has a normal mood and affect.   Labs reviewed:  Recent Labs  12/28/15 03/28/16 07/11/16  NA 140 141 142  K 4.5 4.1 4.0  BUN 42* 52* 49*  CREATININE 1.2* 1.2* 1.3*    Recent Labs  07/11/16  AST 21  ALT 10  ALKPHOS 36    Recent Labs  03/18/16 07/11/16  WBC 9.1 10.5  HGB 12.2 13.6  HCT 39 43  PLT 248 239   Lab Results  Component Value Date   TSH 1.94 07/11/2016   Lab Results  Component Value Date   HGBA1C 6.6 09/20/2016   Significant Diagnostic Results in last 30 days:  No results found.  Assessment/Plan  Chronic TMJ pain Afebrile.TMJ negative for redness or swelling. No dental problem.Pain described as occasional. No pain during visit. Encourage to use pain medications as needed whenever pain recurs. Will add cyclobenzaprine 10 mg tablet at bedtime if pain persist. Continue to monitor.   Family/ staff Communication: Reviewed plan of care with patient and facility Nurse supervisor  Labs/tests ordered: None   Sandrea Hughs, NP

## 2016-11-14 ENCOUNTER — Encounter: Payer: Self-pay | Admitting: Family

## 2016-11-14 ENCOUNTER — Non-Acute Institutional Stay: Payer: Medicare Other | Admitting: Family

## 2016-11-14 DIAGNOSIS — H578 Other specified disorders of eye and adnexa: Secondary | ICD-10-CM

## 2016-11-14 DIAGNOSIS — L89220 Pressure ulcer of left hip, unstageable: Secondary | ICD-10-CM

## 2016-11-14 DIAGNOSIS — M5441 Lumbago with sciatica, right side: Secondary | ICD-10-CM | POA: Diagnosis not present

## 2016-11-14 DIAGNOSIS — M5136 Other intervertebral disc degeneration, lumbar region: Secondary | ICD-10-CM | POA: Diagnosis not present

## 2016-11-14 DIAGNOSIS — G8929 Other chronic pain: Secondary | ICD-10-CM | POA: Diagnosis not present

## 2016-11-14 DIAGNOSIS — H5789 Other specified disorders of eye and adnexa: Secondary | ICD-10-CM

## 2016-11-14 DIAGNOSIS — M5442 Lumbago with sciatica, left side: Secondary | ICD-10-CM | POA: Diagnosis not present

## 2016-11-14 NOTE — Progress Notes (Signed)
Location:  [place] Room Number: 23 Place of Service:  ALF [phone]) Provider: Jannett Schmall FNP-C  Blanchie Serve, MD  Patient Care Team: Blanchie Serve, MD as PCP - General (Internal Medicine) Rutherford Guys, MD as Consulting Physician (Ophthalmology) Kimber Fritts, Nelda Bucks, NP as Nurse Practitioner [facility])  Extended Emergency Contact Information Primary Emergency Contact: Rinke,Leonard Address: [address]          [place] [address] Johnnette Litter of [place] Phone: [phone] Work Phone: [phone] Mobile Phone: [phone] Relation: Son Secondary Emergency Contact: [place] of [place] Phone: [phone] Relation: Friend  Code Status:  DNR Goals of care: Advanced Directive information Advanced Directives 11/14/2016  Does Patient Have a Medical Advance Directive? Yes  Type of Advance Directive Out of facility DNR (pink MOST or yellow form);Living will;Healthcare Power of Attorney  Does patient want to make changes to medical advance directive? -  Copy of [place] in Chart? Yes  Would patient like information on creating a medical advance directive? -  Pre-existing out of facility DNR order (yellow form or pink MOST form) Yellow form placed in chart (order not valid for inpatient use);Pink MOST form placed in chart (order not valid for inpatient use)     Chief Complaint  Patient presents with  . Acute Visit    Wound care referral    HPI:  Pt is a 83 y.o. female seen today at [facility] for an acute visit for evaluation of left hip pressure ulcer. She is seen in her room today per facility Nurse request. Nurse reports patient left hip pressure ulcer side hard with tunneling noted.patient denies any tenderness,drainage, fever or pain to ulcer site.    Past Medical History:  Diagnosis Date  . Acute blood loss anemia   . Allergy   . Asthma   . AVM (arteriovenous  malformation) of colon with hemorrhage   . Bursitis of right hip   . Cataract   . CKD (chronic kidney disease), stage III 11/04/2015  . COLONIC POLYPS 03/26/2005   Qualifier: Diagnosis of  By: Talbert Cage CMA ([place]), June    . DIVERTICULOSIS, COLON 03/26/2005   Qualifier: Diagnosis of  By: Talbert Cage CMA ([place]), June    . DVT (deep venous thrombosis), left 11/03/2015  . Ecchymosis 12/16/2015  . Hyperglycemia 12/16/2015  . Hyperlipidemia   . Hypertension   . Multiple skin tears 12/16/2015  . Osteopenia   . Stasis edema of both lower extremities 11/04/2015  . Unstable gait 12/16/2015  . Urine incontinence 12/16/2015   Past Surgical History:  Procedure Laterality Date  . ABDOMINAL HYSTERECTOMY  2006   Dr. Maryelizabeth Rowan  . CATARACT EXTRACTION W/ INTRAOCULAR LENS IMPLANT Bilateral 1999/2003   Dr. Rutherford Guys  . COLONOSCOPY N/A 11/06/2015   Procedure: COLONOSCOPY;  Surgeon: Mauri Pole, MD;  Location: [facility];  Service: Endoscopy;  Laterality: N/A;  MAC if available, otherwise moderate sedation    Allergies  Allergen Reactions  . Epinephrine Nausea Only    Unknown.  Marland Kitchen Hydrocodone-Acetaminophen     REACTION: nausea  . Labetalol   . Procaine     Allergies as of 11/14/2016      Reactions   Epinephrine Nausea Only   Unknown.   Hydrocodone-acetaminophen    REACTION: nausea   Labetalol    Procaine       Medication List       Accurate as of 11/14/16  4:07 PM. Always  use your most recent med list.          acetaminophen 500 MG tablet Commonly known as:  TYLENOL Take 500 mg by mouth. Take one twice a day   calcium carbonate 500 MG chewable tablet Commonly known as:  TUMS - dosed in mg elemental calcium Chew 2 tablets by mouth daily.   feeding supplement (PRO-STAT SUGAR FREE 64) Liqd Take 30 mLs by mouth 2 (two) times daily.   furosemide 20 MG tablet Commonly known as:  LASIX Take 1 tablet (20 mg total) by mouth daily.   gabapentin 100 MG capsule Commonly known as:   NEURONTIN Take 3 (344m) twice daily   HYDROcodone-acetaminophen 5-325 MG tablet Commonly known as:  NORCO/VICODIN Take 0.5 tablets by mouth 3 (three) times daily. 1/2 tablet three times daily for pain   ICAPS MV Tabs Take 1 tablet by mouth daily.   multivitamin with minerals tablet Take 1 tablet by mouth. Take one tablet once daily   metFORMIN 500 MG tablet Commonly known as:  GLUCOPHAGE Take 500 mg by mouth daily with breakfast.   silver sulfADIAZINE 1 % cream Commonly known as:  SILVADENE Apply 1 application topically. Apply cream twice daily to left hip wound   simvastatin 10 MG tablet Commonly known as:  ZOCOR Take 10 mg by mouth daily.   spironolactone 25 MG tablet Commonly known as:  ALDACTONE Take 25 mg by mouth. Take one tablet daily       Review of Systems  Constitutional: Negative for activity change, appetite change, chills, fatigue and fever.  HENT: Negative for congestion, ear discharge, ear pain, facial swelling, postnasal drip, rhinorrhea, sinus pain, sinus pressure, sneezing, sore throat and tinnitus.   Eyes: Negative for pain, discharge, itching and visual disturbance.       Left eye redness  Respiratory: Negative for cough, chest tightness, shortness of breath and wheezing.   Cardiovascular: Negative for chest pain, palpitations and leg swelling.  Gastrointestinal: Negative for abdominal distention, abdominal pain, constipation, diarrhea, nausea and vomiting.  Genitourinary: Negative for dysuria, flank pain, frequency and urgency.  Musculoskeletal: Positive for gait problem.  Skin: Negative for color change, pallor and rash.       Left hip pressure ulcer   Neurological: Negative for dizziness, seizures, syncope, light-headedness and headaches.  Psychiatric/Behavioral: Negative for agitation, confusion, hallucinations and sleep disturbance. The patient is not nervous/anxious.     Immunization History  Administered Date(s) Administered  . DTaP  05/13/2005  . Influenza-Unspecified 02/11/2015, 02/22/2016  . PPD Test 12/08/2015, 12/22/2015  . Pneumococcal Conjugate-13 05/13/2014  . Pneumococcal Polysaccharide-23 05/13/2014  . Zoster 05/13/2014   Pertinent  Health Maintenance Due  Topic Date Due  . FOOT EXAM  09/12/1935  . OPHTHALMOLOGY EXAM  09/12/1935  . URINE MICROALBUMIN  09/12/1935  . PNA vac Low Risk Adult (2 of 2 - PPSV23) 05/14/2015  . INFLUENZA VACCINE  12/11/2016  . HEMOGLOBIN A1C  03/23/2017  . DEXA SCAN  Completed   Fall Risk  11/03/2015  Falls in the past year? No    Vitals:   11/14/16 1139  BP: 138/72  Pulse: 82  Resp: 20  Temp: 98.1 F (36.7 C)  SpO2: 98%  Weight: 145 lb (65.8 kg)  Height: 5' 3"  (1.6 m)   Body mass index is 25.69 kg/m. Physical Exam  Constitutional: She is oriented to person, place, and time. She appears well-developed and well-nourished. No distress.  HENT:  Head: Normocephalic.  Mouth/Throat: Oropharynx is clear and moist. No  oropharyngeal exudate.  Eyes: Pupils are equal, round, and reactive to light. Right eye exhibits no discharge. Left eye exhibits no discharge. No scleral icterus.  Neck: Normal range of motion. No JVD present. No thyromegaly present.  Cardiovascular: Normal rate, regular rhythm, normal heart sounds and intact distal pulses.  Exam reveals no gallop and no friction rub.   No murmur heard. Pulmonary/Chest: Effort normal and breath sounds normal. No respiratory distress. She has no wheezes. She has no rales.  Abdominal: Soft. Bowel sounds are normal. She exhibits no distension. There is no tenderness. There is no rebound and no guarding.  Musculoskeletal: She exhibits no tenderness or deformity.  Moves x 4 extremities except right hip limited due to pain  Lymphadenopathy:    She has no cervical adenopathy.  Neurological: She is oriented to person, place, and time.  Skin: Skin is warm and dry. No rash noted. No erythema. No pallor.  Left ischium pressure ulcer  less than dime size ulcer healing edges hard to touch wound tunneling noted. No drainage noted.   Psychiatric: She has a normal mood and affect.    Labs reviewed:  Recent Labs  12/28/15 03/28/16 07/11/16  NA 140 141 142  K 4.5 4.1 4.0  BUN 42* 52* 49*  CREATININE 1.2* 1.2* 1.3*    Recent Labs  07/11/16  AST 21  ALT 10  ALKPHOS 36    Recent Labs  03/18/16 07/11/16  WBC 9.1 10.5  HGB 12.2 13.6  HCT 39 43  PLT 248 239   Lab Results  Component Value Date   TSH 1.94 07/11/2016   Lab Results  Component Value Date   HGBA1C 6.6 09/20/2016    Significant Diagnostic Results in last 30 days:  No results found.  Assessment/Plan   Unstageable pressure ulcer of left hip Afebrile. Pressure ulcer edges seems to be progressive healing sides hard to touch inner wound tunneling noted. No drainage or signs of infections. Will refer to wound care center for evaluation.   Left eye redness No pain or itching reported.No signs of conjunctivitis. Progressive healing reported.continue to monitor.    Family/ staff Communication: Reviewed plan of care with patient and facility Nurse supervisor  Labs/tests ordered: None   Sandrea Hughs, NP

## 2016-11-15 NOTE — Addendum Note (Signed)
Addended byMarlowe Sax C on: 11/15/2016 02:38 PM   Modules accepted: Level of Service

## 2016-11-25 DIAGNOSIS — Z5181 Encounter for therapeutic drug level monitoring: Secondary | ICD-10-CM | POA: Diagnosis not present

## 2016-11-29 ENCOUNTER — Encounter (HOSPITAL_BASED_OUTPATIENT_CLINIC_OR_DEPARTMENT_OTHER): Payer: Medicare Other | Attending: Internal Medicine

## 2016-11-29 DIAGNOSIS — Z86718 Personal history of other venous thrombosis and embolism: Secondary | ICD-10-CM | POA: Insufficient documentation

## 2016-11-29 DIAGNOSIS — Z7984 Long term (current) use of oral hypoglycemic drugs: Secondary | ICD-10-CM | POA: Insufficient documentation

## 2016-11-29 DIAGNOSIS — N183 Chronic kidney disease, stage 3 (moderate): Secondary | ICD-10-CM | POA: Diagnosis not present

## 2016-11-29 DIAGNOSIS — L89323 Pressure ulcer of left buttock, stage 3: Secondary | ICD-10-CM | POA: Insufficient documentation

## 2016-11-29 DIAGNOSIS — L89610 Pressure ulcer of right heel, unstageable: Secondary | ICD-10-CM | POA: Diagnosis not present

## 2016-11-29 DIAGNOSIS — L89223 Pressure ulcer of left hip, stage 3: Secondary | ICD-10-CM | POA: Diagnosis not present

## 2016-11-29 DIAGNOSIS — I129 Hypertensive chronic kidney disease with stage 1 through stage 4 chronic kidney disease, or unspecified chronic kidney disease: Secondary | ICD-10-CM | POA: Insufficient documentation

## 2016-12-04 ENCOUNTER — Non-Acute Institutional Stay: Payer: Medicare Other | Admitting: Family

## 2016-12-04 ENCOUNTER — Encounter: Payer: Self-pay | Admitting: Family

## 2016-12-04 DIAGNOSIS — R531 Weakness: Secondary | ICD-10-CM

## 2016-12-04 DIAGNOSIS — R2681 Unsteadiness on feet: Secondary | ICD-10-CM | POA: Diagnosis not present

## 2016-12-13 ENCOUNTER — Non-Acute Institutional Stay: Payer: Medicare Other | Admitting: Internal Medicine

## 2016-12-13 ENCOUNTER — Encounter: Payer: Self-pay | Admitting: Internal Medicine

## 2016-12-13 ENCOUNTER — Encounter (HOSPITAL_BASED_OUTPATIENT_CLINIC_OR_DEPARTMENT_OTHER): Payer: Medicare Other | Attending: Internal Medicine

## 2016-12-13 DIAGNOSIS — R599 Enlarged lymph nodes, unspecified: Secondary | ICD-10-CM | POA: Diagnosis not present

## 2016-12-13 DIAGNOSIS — I10 Essential (primary) hypertension: Secondary | ICD-10-CM | POA: Diagnosis not present

## 2016-12-13 DIAGNOSIS — E119 Type 2 diabetes mellitus without complications: Secondary | ICD-10-CM | POA: Diagnosis not present

## 2016-12-13 DIAGNOSIS — L89223 Pressure ulcer of left hip, stage 3: Secondary | ICD-10-CM | POA: Diagnosis not present

## 2016-12-13 DIAGNOSIS — L89610 Pressure ulcer of right heel, unstageable: Secondary | ICD-10-CM | POA: Insufficient documentation

## 2016-12-13 DIAGNOSIS — R220 Localized swelling, mass and lump, head: Secondary | ICD-10-CM | POA: Diagnosis not present

## 2016-12-13 DIAGNOSIS — Z86718 Personal history of other venous thrombosis and embolism: Secondary | ICD-10-CM | POA: Diagnosis not present

## 2016-12-13 NOTE — Progress Notes (Signed)
Location:  [place] Room Number: 23 Place of Service:  ALF [phone]) Provider:  Blanchie Serve, MD  Blanchie Serve, MD  Patient Care Team: Blanchie Serve, MD as PCP - General (Internal Medicine) Rutherford Guys, MD as Consulting Physician (Ophthalmology) Ngetich, Nelda Bucks, NP as Nurse Practitioner [facility])  Extended Emergency Contact Information Primary Emergency Contact: Barnhard,Leonard Address: [address] [place] [address] Johnnette Litter of [place] Phone: [phone] Work Phone: [phone] Mobile Phone: [phone] Relation: Son Secondary Emergency Contact: [place] of [place] Phone: [phone] Relation: Friend  Code Status:  DNR Goals of care: Advanced Directive information Advanced Directives 12/04/2016  Does Patient Have a Medical Advance Directive? Yes  Type of Advance Directive Out of facility DNR (pink MOST or yellow form);Living will;Healthcare Power of Attorney  Does patient want to make changes to medical advance directive? -  Copy of [place] in Chart? Yes  Would patient like information on creating a medical advance directive? -  Pre-existing out of facility DNR order (yellow form or pink MOST form) Yellow form placed in chart (order not valid for inpatient use);Pink MOST form placed in chart (order not valid for inpatient use)     Chief Complaint  Patient presents with  . Acute Visit    Swollen left jaw    HPI:  Pt is a 83 y.o. female seen today for an acute visit for swollen left jaw x 2 days. She noticed swelling and pain to left jaw area. Pain was intermittent yesterday. Denies any swelling elsewhere. Denies any fever or chills. She was started empirically on augmentin yesterday by on call provider.    Past Medical History:  Diagnosis Date  . Acute blood loss anemia   . Allergy   . Asthma   . AVM (arteriovenous malformation) of colon with  hemorrhage   . Bursitis of right hip   . Cataract   . CKD (chronic kidney disease), stage III 11/04/2015  . COLONIC POLYPS 03/26/2005   Qualifier: Diagnosis of  By: Talbert Cage CMA ([place]), June    . DIVERTICULOSIS, COLON 03/26/2005   Qualifier: Diagnosis of  By: Talbert Cage CMA ([place]), June    . DVT (deep venous thrombosis), left 11/03/2015  . Ecchymosis 12/16/2015  . Hyperglycemia 12/16/2015  . Hyperlipidemia   . Hypertension   . Multiple skin tears 12/16/2015  . Osteopenia   . Stasis edema of both lower extremities 11/04/2015  . Unstable gait 12/16/2015  . Urine incontinence 12/16/2015   Past Surgical History:  Procedure Laterality Date  . ABDOMINAL HYSTERECTOMY  2006   Dr. Maryelizabeth Rowan  . CATARACT EXTRACTION W/ INTRAOCULAR LENS IMPLANT Bilateral 1999/2003   Dr. Rutherford Guys  . COLONOSCOPY N/A 11/06/2015   Procedure: COLONOSCOPY;  Surgeon: Mauri Pole, MD;  Location: [facility];  Service: Endoscopy;  Laterality: N/A;  MAC if available, otherwise moderate sedation    Allergies  Allergen Reactions  . Epinephrine Nausea Only    Unknown.  Marland Kitchen Hydrocodone-Acetaminophen     REACTION: nausea  . Labetalol   . Procaine     Outpatient Encounter Prescriptions as of 12/13/2016  Medication Sig  . acetaminophen (TYLENOL) 325 MG tablet Take 650 mg by mouth every 4 (four) hours as needed.  Marland Kitchen acetaminophen (TYLENOL) 500 MG tablet Take 500 mg by mouth 2 (two) times daily.   . Amino Acids-Protein Hydrolys (FEEDING SUPPLEMENT, PRO-STAT SUGAR FREE 64,) LIQD Take  30 mLs by mouth 2 (two) times daily.   Marland Kitchen amoxicillin-clavulanate (AUGMENTIN) 875-125 MG tablet Take 1 tablet by mouth daily.  . calcium carbonate (TUMS - DOSED IN MG ELEMENTAL CALCIUM) 500 MG chewable tablet Chew 1 tablet by mouth daily.   . furosemide (LASIX) 20 MG tablet Take 1 tablet (20 mg total) by mouth daily.  Marland Kitchen gabapentin (NEURONTIN) 100 MG capsule Take 3 (333m) twice daily  . HYDROcodone-acetaminophen (NORCO/VICODIN) 5-325 MG tablet Take  0.5 tablets by mouth 3 (three) times daily. 1/2 tablet three times daily for pain   . Lidocaine 4 % PTCH Apply 1 application topically every 12 (twelve) hours. Right hip  . metFORMIN (GLUCOPHAGE) 500 MG tablet Take 500 mg by mouth daily with breakfast.   . Multiple Vitamins-Minerals (ICAPS MV) TABS Take 1 tablet by mouth daily.  . Multiple Vitamins-Minerals (MULTIVITAMIN WITH MINERALS) tablet Take 1 tablet by mouth. Take one tablet once daily  . silver sulfADIAZINE (SILVADENE) 1 % cream Apply 1 application topically. Apply cream twice daily to left hip wound  . simvastatin (ZOCOR) 10 MG tablet Take 10 mg by mouth daily.  .Marland Kitchenspironolactone (ALDACTONE) 25 MG tablet Take 25 mg by mouth. Take one tablet daily   No facility-administered encounter medications on file as of 12/13/2016.     Review of Systems  Constitutional: Negative for appetite change, chills, diaphoresis and fever.  HENT: Positive for facial swelling. Negative for congestion, drooling, ear discharge, ear pain, hearing loss, mouth sores, nosebleeds, rhinorrhea, sinus pain, sinus pressure, sore throat, trouble swallowing and voice change.        Recently got a cavity filled by her dentist last week. No dental workup done on left teeth area or gums  Eyes: Negative for visual disturbance.       Has corrective lenses  Respiratory: Negative for cough, shortness of breath, wheezing and stridor.   Cardiovascular: Negative for chest pain and palpitations.  Gastrointestinal: Negative for abdominal pain, diarrhea, nausea and vomiting.  Skin: Positive for wound. Negative for rash.  Neurological: Negative for dizziness, speech difficulty, numbness and headaches.  Psychiatric/Behavioral: Negative for confusion.    Immunization History  Administered Date(s) Administered  . DTaP 05/13/2005  . Influenza-Unspecified 02/11/2015, 02/22/2016  . PPD Test 12/08/2015, 12/22/2015  . Pneumococcal Conjugate-13 05/13/2014  . Pneumococcal  Polysaccharide-23 05/13/2014  . Zoster 05/13/2014   Pertinent  Health Maintenance Due  Topic Date Due  . FOOT EXAM  09/12/1935  . OPHTHALMOLOGY EXAM  09/12/1935  . URINE MICROALBUMIN  09/12/1935  . PNA vac Low Risk Adult (2 of 2 - PPSV23) 05/14/2015  . INFLUENZA VACCINE  12/11/2016  . HEMOGLOBIN A1C  03/23/2017  . DEXA SCAN  Completed   Fall Risk  11/03/2015  Falls in the past year? No   Functional Status Survey:    Vitals:   12/13/16 1114  BP: 106/60  Pulse: 60  Resp: 18  Temp: (!) 96.9 F (36.1 C)  TempSrc: Oral  SpO2: 98%  Weight: 148 lb 12.8 oz (67.5 kg)  Height: 5' 3"  (1.6 m)   Body mass index is 26.36 kg/m. Physical Exam  Constitutional: She is oriented to person, place, and time. She appears well-developed and well-nourished. No distress.  HENT:  Head: Normocephalic and atraumatic.  Right Ear: External ear normal.  Left Ear: External ear normal.  Nose: Nose normal.  Mouth/Throat: Oropharynx is clear and moist. No oropharyngeal exudate.  Cerumen to both ears  Eyes: Pupils are equal, round, and reactive to light. Conjunctivae  and EOM are normal. Right eye exhibits no discharge. Left eye exhibits no discharge.  Neck: Normal range of motion. No JVD present. No tracheal deviation present. No thyromegaly present.  Left submandibular palpable mass, 3 x 3 cm, slight tenderness to palpation, no drainage or pus point, erythema to and around the palpable mass/ lymph node, firm and immobile, no other lymphadenopathy  Cardiovascular: Normal rate and regular rhythm.   Pulmonary/Chest: Effort normal and breath sounds normal. No respiratory distress. She has no wheezes. She has no rales.  Abdominal: Soft. Bowel sounds are normal.  Musculoskeletal: Normal range of motion. She exhibits no edema.  Lymphadenopathy:    She has cervical adenopathy.  Neurological: She is alert and oriented to person, place, and time.  Skin: Skin is warm and dry. She is not diaphoretic.  Stage 2  pressure ulcer to left thigh, left lower hip area per wound report in matrix, granulation tissue noted and no signs of infection  Psychiatric: She has a normal mood and affect. Her behavior is normal.    Labs reviewed:  Recent Labs  12/28/15 03/28/16 07/11/16  NA 140 141 142  K 4.5 4.1 4.0  BUN 42* 52* 49*  CREATININE 1.2* 1.2* 1.3*    Recent Labs  07/11/16  AST 21  ALT 10  ALKPHOS 36    Recent Labs  03/18/16 07/11/16  WBC 9.1 10.5  HGB 12.2 13.6  HCT 39 43  PLT 248 239   Lab Results  Component Value Date   TSH 1.94 07/11/2016   Lab Results  Component Value Date   HGBA1C 6.6 09/20/2016   No results found for: CHOL, HDL, LDLCALC, LDLDIRECT, TRIG, CHOLHDL  Significant Diagnostic Results in last 30 days:  No results found.  Assessment/Plan  1. Mass of left submandibular region Palpable, tender, non fluctuant and firm new mass to left submandibular area. Able to open and close her mouth without trouble. Per nursing and pt size of mass has slightly decreased from yesterday. Possible reactive lymph node to inflammation/ infection. Normal oropharynx on exam. Was empirically started on augmentin yesterday. Continue this bid x 5 days and reassess on Monday. Monitor for fever, dysphagia and worsening pain. Advised to take tylenol as needed for pain. Never been a smoker in past. No clinical symptom of viral infection. If no improvement, consider imaging of the mass and need for possible biopsy.   2. Enlarged lymph node Concern for infection causing enlarged lymph node. Monitor her vital signs bid x 3 days. Antibiotic as above. Lab as above. Monitor clinically.    Family/ staff Communication: reviewed care plan with patient and charge nurse.    Labs/tests ordered: cbc with diff  Spent 40 minutes with patient of which more than 50% related to direct patient care.    Blanchie Serve, MD Internal Medicine [facility] [address] [place], [place] [address] Cell Phone (Monday-Friday 8 am - 5 pm): [phone] On Call: [phone] and follow prompts after 5 pm and on weekends Office Phone: [phone] Office Fax: [phone]

## 2016-12-16 ENCOUNTER — Non-Acute Institutional Stay: Payer: Medicare Other | Admitting: Internal Medicine

## 2016-12-16 ENCOUNTER — Encounter: Payer: Self-pay | Admitting: Internal Medicine

## 2016-12-16 DIAGNOSIS — M6281 Muscle weakness (generalized): Secondary | ICD-10-CM | POA: Diagnosis not present

## 2016-12-16 DIAGNOSIS — R220 Localized swelling, mass and lump, head: Secondary | ICD-10-CM

## 2016-12-16 DIAGNOSIS — I10 Essential (primary) hypertension: Secondary | ICD-10-CM | POA: Diagnosis not present

## 2016-12-16 DIAGNOSIS — R197 Diarrhea, unspecified: Secondary | ICD-10-CM

## 2016-12-16 DIAGNOSIS — R229 Localized swelling, mass and lump, unspecified: Secondary | ICD-10-CM | POA: Diagnosis not present

## 2016-12-16 LAB — CBC AND DIFFERENTIAL
HEMATOCRIT: 39 (ref 36–46)
Hemoglobin: 12.4 (ref 12.0–16.0)
PLATELETS: 255 (ref 150–399)
WBC: 8.4

## 2016-12-16 NOTE — Progress Notes (Signed)
Location:  [place] Room Number: 23 Place of Service:  ALF [phone]) Provider:  Blanchie Serve, MD  Blanchie Serve, MD  Patient Care Team: Blanchie Serve, MD as PCP - General (Internal Medicine) Rutherford Guys, MD as Consulting Physician (Ophthalmology) Ngetich, Nelda Bucks, NP as Nurse Practitioner [facility] Medicine)  Extended Emergency Contact Information Primary Emergency Contact: Tennell,Leonard Address: [address] [place] [address] Johnnette Litter of [place] Phone: [phone] Work Phone: [phone] Mobile Phone: [phone] Relation: Son Secondary Emergency Contact: [place] Phone: [phone] Relation: Friend  Code Status:  DNR Goals of care: Advanced Directive information Advanced Directives 12/04/2016  Does Patient Have a Medical Advance Directive? Yes  Type of Advance Directive Out of facility DNR (pink MOST or yellow form);Living will;Healthcare Power of Attorney  Does patient want to make changes to medical advance directive? -  Copy of [place] in Chart? Yes  Would patient like information on creating a medical advance directive? -  Pre-existing out of facility DNR order (yellow form or pink MOST form) Yellow form placed in chart (order not valid for inpatient use);Pink MOST form placed in chart (order not valid for inpatient use)     Chief Complaint  Patient presents with  . Acute Visit    left submandibular mass    HPI:  Pt is a 83 y.o. female seen today for an acute visit for follow up on left submandibular mass. I had seen her 3 days back and she has been on antibiotic empirically for possible infection. She remains afebrile but the swelling/ mass appears to increase in size. Redness around the mass has subsided. Patient denies any fever, trouble with chewing or swallowing. Complaints of discomfort with touch. She has been having loose stool x 2 days now.  Appetite is unchanged.    Past Medical History:  Diagnosis Date  . Acute blood loss anemia   . Allergy   . Asthma   . AVM (arteriovenous malformation) of colon with hemorrhage   . Bursitis of right hip   . Cataract   . CKD (chronic kidney disease), stage III 11/04/2015  . COLONIC POLYPS 03/26/2005   Qualifier: Diagnosis of  By: Talbert Cage CMA ([place]), June    . DIVERTICULOSIS, COLON 03/26/2005   Qualifier: Diagnosis of  By: Talbert Cage CMA ([place]), June    . DVT (deep venous thrombosis), left 11/03/2015  . Ecchymosis 12/16/2015  . Hyperglycemia 12/16/2015  . Hyperlipidemia   . Hypertension   . Multiple skin tears 12/16/2015  . Osteopenia   . Stasis edema of both lower extremities 11/04/2015  . Unstable gait 12/16/2015  . Urine incontinence 12/16/2015   Past Surgical History:  Procedure Laterality Date  . ABDOMINAL HYSTERECTOMY  2006   Dr. Maryelizabeth Rowan  . CATARACT EXTRACTION W/ INTRAOCULAR LENS IMPLANT Bilateral 1999/2003   Dr. Rutherford Guys  . COLONOSCOPY N/A 11/06/2015   Procedure: COLONOSCOPY;  Surgeon: Mauri Pole, MD;  Location: [facility];  Service: Endoscopy;  Laterality: N/A;  MAC if available, otherwise moderate sedation    Allergies  Allergen Reactions  . Epinephrine Nausea Only    Unknown.  Marland Kitchen Hydrocodone-Acetaminophen     REACTION: nausea  . Labetalol   . Procaine     Outpatient Encounter Prescriptions as of 12/16/2016  Medication Sig  . acetaminophen (TYLENOL) 325 MG tablet Take 650 mg by mouth every 4 (four) hours as needed.  Marland Kitchen  acetaminophen (TYLENOL) 500 MG tablet Take 500 mg by mouth 2 (two) times daily.   . Amino Acids-Protein Hydrolys (FEEDING SUPPLEMENT, PRO-STAT SUGAR FREE 64,) LIQD Take 30 mLs by mouth 2 (two) times daily.   Marland Kitchen amoxicillin-clavulanate (AUGMENTIN) 875-125 MG tablet Take 1 tablet by mouth every 12 (twelve) hours. Stop date 12/18/16  . calcium carbonate (TUMS - DOSED IN MG ELEMENTAL CALCIUM) 500 MG chewable tablet Chew 1 tablet by mouth daily.   .  furosemide (LASIX) 20 MG tablet Take 1 tablet (20 mg total) by mouth daily.  Marland Kitchen gabapentin (NEURONTIN) 100 MG capsule Take 300 mg by mouth 2 (two) times daily.   Marland Kitchen HYDROcodone-acetaminophen (NORCO/VICODIN) 5-325 MG tablet Take 0.5 tablets by mouth 3 (three) times daily. 1/2 tablet three times daily for pain   . Lidocaine 4 % PTCH Apply 1 application topically every 12 (twelve) hours. Right hip  . metFORMIN (GLUCOPHAGE) 500 MG tablet Take 500 mg by mouth daily with breakfast.   . Multiple Vitamins-Minerals (ICAPS MV) TABS Take 1 tablet by mouth daily.  . Multiple Vitamins-Minerals (MULTIVITAMIN WITH MINERALS) tablet Take 1 tablet by mouth. Take one tablet once daily  . silver sulfADIAZINE (SILVADENE) 1 % cream Apply 1 application topically. Apply cream twice daily to left hip wound  . simvastatin (ZOCOR) 10 MG tablet Take 10 mg by mouth daily.  Marland Kitchen spironolactone (ALDACTONE) 25 MG tablet Take 25 mg by mouth daily.    No facility-administered encounter medications on file as of 12/16/2016.     Review of Systems  Constitutional: Negative for appetite change, chills, diaphoresis, fatigue, fever and unexpected weight change.  HENT: Negative for congestion, dental problem, drooling, ear discharge, ear pain, facial swelling, hearing loss, mouth sores, nosebleeds, rhinorrhea, sinus pain, sinus pressure, sore throat, trouble swallowing and voice change.        Mass to left lower jawline  Eyes: Negative for visual disturbance.       Has corrective lenses  Respiratory: Negative for cough, shortness of breath, wheezing and stridor.   Cardiovascular: Negative for chest pain and palpitations.  Gastrointestinal: Positive for diarrhea. Negative for abdominal pain, blood in stool, nausea and vomiting.  Skin: Positive for wound. Negative for rash.  Neurological: Negative for dizziness, speech difficulty, numbness and headaches.  Psychiatric/Behavioral: Negative for confusion.    Immunization History    Administered Date(s) Administered  . DTaP 05/13/2005  . Influenza-Unspecified 02/11/2015, 02/22/2016  . PPD Test 12/08/2015, 12/22/2015  . Pneumococcal Conjugate-13 05/13/2014  . Pneumococcal Polysaccharide-23 05/13/2014  . Zoster 05/13/2014   Pertinent  Health Maintenance Due  Topic Date Due  . FOOT EXAM  09/12/1935  . OPHTHALMOLOGY EXAM  09/12/1935  . URINE MICROALBUMIN  09/12/1935  . PNA vac Low Risk Adult (2 of 2 - PPSV23) 05/14/2015  . INFLUENZA VACCINE  12/11/2016  . HEMOGLOBIN A1C  03/23/2017  . DEXA SCAN  Completed   Fall Risk  11/03/2015  Falls in the past year? No   Functional Status Survey:    Vitals:   12/16/16 0949  BP: 102/61  Pulse: 71  Resp: 18  Temp: 98 F (36.7 C)  TempSrc: Oral  SpO2: 96%  Weight: 143 lb 12.8 oz (65.2 kg)  Height: _0  (1.6 m)   Body mass index is 25.47 kg/m. Physical Exam  Constitutional: She is oriented to person, place, and time. She appears well-developed and well-nourished. No distress.  HENT:  Head: Normocephalic and atraumatic.  Right Ear: External ear normal.  Left Ear: External ear  normal.  Nose: Nose normal.  Mouth/Throat: Oropharynx is clear and moist. No oropharyngeal exudate.  Cerumen to both ears  Eyes: Pupils are equal, round, and reactive to light. Conjunctivae and EOM are normal. Right eye exhibits no discharge. Left eye exhibits no discharge.  Neck: Normal range of motion. No JVD present. No tracheal deviation present. No thyromegaly present.  Left submandibular palpable mass, 4 x 3 cm (increase in size), slight tenderness to palpation, no drainage or pus point but has a small raised area with bruising noted, erythema to and around the palpable mass/ lymph node that has decreased some from last visit, firm and immobile, no other cervical lymphadenopathy  Cardiovascular: Normal rate and regular rhythm.   Pulmonary/Chest: Effort normal and breath sounds normal. No respiratory distress. She has no wheezes. She has  no rales.  Abdominal: Soft. Bowel sounds are normal. There is no tenderness. There is no rebound and no guarding.  Musculoskeletal: Normal range of motion. She exhibits no edema.  Lymphadenopathy:    She has cervical adenopathy.  Neurological: She is alert and oriented to person, place, and time.  Skin: Skin is warm and dry. She is not diaphoretic.  No inguinal or axillary lymphadenopathy  Psychiatric: She has a normal mood and affect. Her behavior is normal.    Labs reviewed:  Recent Labs  12/28/15 03/28/16 07/11/16  NA 140 141 142  K 4.5 4.1 4.0  BUN 42* 52* 49*  CREATININE 1.2* 1.2* 1.3*    Recent Labs  07/11/16  AST 21  ALT 10  ALKPHOS 36    Recent Labs  03/18/16 07/11/16  WBC 9.1 10.5  HGB 12.2 13.6  HCT 39 43  PLT 248 239   Lab Results  Component Value Date   TSH 1.94 07/11/2016   Lab Results  Component Value Date   HGBA1C 6.6 09/20/2016   No results found for: CHOL, HDL, LDLCALC, LDLDIRECT, TRIG, CHOLHDL  Significant Diagnostic Results in last 30 days:  No results found.  Assessment/Plan  1. Mass of left submandibular region Palpable, tender, firm new mass to left submandibular area. This has increased some in size from last visit. pending cbc with diff result. Non smoker, no fever, no night sweats and no weight change reported. Able to open and close her mouth without trouble. Normal oropharynx on exam. Monitor for fever, dysphagia and worsening pain. Advised to take tylenol as needed for pain and warm compresses. Obtain ultrasound of her neck area to assess mass further for possible abscess vs lymph node vs mass. Also obtain ENT consult to assess for need for biopsy. Continue augmentin course of 5 days.  2. Diarrhea Antibiotic associated, add florastor 250 mg bid x 1 week. Reassess if no improvement. Maintain hydration    Family/ staff Communication: reviewed care plan with patient and charge nurse.    Labs/tests ordered: ultrasound neck, ENT  referral  Spent 30 minutes with patient of which more than 50% related to direct patient care.    Blanchie Serve, MD Internal Medicine [facility] [address] [place], [place] [address] Cell Phone (Monday-Friday 8 am - 5 pm): [phone] On Call: [phone] and follow prompts after 5 pm and on weekends Office Phone: [phone] Office Fax: [phone]

## 2016-12-17 ENCOUNTER — Other Ambulatory Visit: Payer: Self-pay | Admitting: *Deleted

## 2016-12-17 MED ORDER — HYDROCODONE-ACETAMINOPHEN 5-325 MG PO TABS: 0.5000 | ORAL_TABLET | Freq: Three times a day (TID) | ORAL | 0 refills | Status: DC

## 2016-12-20 NOTE — Addendum Note (Signed)
Addended byBlanchie Serve on: 12/20/2016 02:03 PM   Modules accepted: Level of Service

## 2016-12-25 DIAGNOSIS — L0201 Cutaneous abscess of face: Secondary | ICD-10-CM | POA: Diagnosis not present

## 2016-12-27 DIAGNOSIS — L89610 Pressure ulcer of right heel, unstageable: Secondary | ICD-10-CM | POA: Diagnosis not present

## 2016-12-27 DIAGNOSIS — Z86718 Personal history of other venous thrombosis and embolism: Secondary | ICD-10-CM | POA: Diagnosis not present

## 2016-12-27 DIAGNOSIS — I10 Essential (primary) hypertension: Secondary | ICD-10-CM | POA: Diagnosis not present

## 2016-12-27 DIAGNOSIS — E119 Type 2 diabetes mellitus without complications: Secondary | ICD-10-CM | POA: Diagnosis not present

## 2016-12-27 DIAGNOSIS — S71002A Unspecified open wound, left hip, initial encounter: Secondary | ICD-10-CM | POA: Diagnosis not present

## 2016-12-27 DIAGNOSIS — L89223 Pressure ulcer of left hip, stage 3: Secondary | ICD-10-CM | POA: Diagnosis not present

## 2016-12-27 NOTE — Progress Notes (Signed)
Location:  [place] Room Number: 23 Place of Service:  ALF [phone]) Provider: Romari Gasparro FNP-C  Blanchie Serve, MD  Patient Care Team: Blanchie Serve, MD as PCP - General (Internal Medicine) Rutherford Guys, MD as Consulting Physician (Ophthalmology) Lennis Korb, Nelda Bucks, NP as Nurse Practitioner [facility] Medicine)  Extended Emergency Contact Information Primary Emergency Contact: Caya,Leonard Address: [address] [address] Johnnette Litter of [place] Phone: [phone] Work Phone: [phone] Mobile Phone: [phone] Relation: Son Secondary Emergency Contact: [place] Phone: [phone] Relation: Friend  Code Status:  DNR Goals of care: Advanced Directive information Advanced Directives 12/04/2016  Does Patient Have a Medical Advance Directive? Yes  Type of Advance Directive Out of facility DNR (pink MOST or yellow form);Living will;Healthcare Power of Attorney  Does patient want to make changes to medical advance directive? -  Copy of [place] in Chart? Yes  Would patient like information on creating a medical advance directive? -  Pre-existing out of facility DNR order (yellow form or pink MOST form) Yellow form placed in chart (order not valid for inpatient use);Pink MOST form placed in chart (order not valid for inpatient use)     Chief Complaint  Patient presents with  . Acute Visit    decreased mobility requiring assistance with ambulation; ? PT    HPI:  Pt is a 83 y.o. female seen today at [facility] for an acute visit for evaluation of decreased mobility. She is seen in her room today per facility nurse request.Nurse reports patient has had decreased mobility and has required assistance with ambulation. Patient states having generalized weakness. She reports fair appetite. Denies any fever, chills,cough or signs of urinary tract infections. No  fall episodes she has had a 4 pound weight loss over one month.Facility Registered dietician continues to follow up.    Past Medical History:  Diagnosis Date  . Acute blood loss anemia   . Allergy   . Asthma   . AVM (arteriovenous malformation) of colon with hemorrhage   . Bursitis of right hip   . Cataract   . CKD (chronic kidney disease), stage III 11/04/2015  . COLONIC POLYPS 03/26/2005   Qualifier: Diagnosis of  By: Talbert Cage CMA ([place]), June    . DIVERTICULOSIS, COLON 03/26/2005   Qualifier: Diagnosis of  By: Talbert Cage CMA ([place]), June    . DVT (deep venous thrombosis), left 11/03/2015  . Ecchymosis 12/16/2015  . Hyperglycemia 12/16/2015  . Hyperlipidemia   . Hypertension   . Multiple skin tears 12/16/2015  . Osteopenia   . Stasis edema of both lower extremities 11/04/2015  . Unstable gait 12/16/2015  . Urine incontinence 12/16/2015   Past Surgical History:  Procedure Laterality Date  . ABDOMINAL HYSTERECTOMY  2006   Dr. Maryelizabeth Rowan  . CATARACT EXTRACTION W/ INTRAOCULAR LENS IMPLANT Bilateral 1999/2003   Dr. Rutherford Guys  . COLONOSCOPY N/A 11/06/2015   Procedure: COLONOSCOPY;  Surgeon: Mauri Pole, MD;  Location: [facility];  Service: Endoscopy;  Laterality: N/A;  MAC if available, otherwise moderate sedation    Allergies  Allergen Reactions  . Epinephrine Nausea Only    Unknown.  Marland Kitchen Hydrocodone-Acetaminophen     REACTION: nausea  . Labetalol   . Procaine     Allergies as of 12/04/2016      Reactions   Epinephrine Nausea Only   Unknown.   Hydrocodone-acetaminophen  REACTION: nausea   Labetalol    Procaine       Medication List       Accurate as of 12/04/16 11:59 PM. Always use your most recent med list.          acetaminophen 500 MG tablet Commonly known as:  TYLENOL Take 500 mg by mouth 2 (two) times daily.   calcium carbonate 500 MG chewable tablet Commonly known as:  TUMS - dosed in mg elemental calcium Chew 1 tablet by mouth daily.   feeding  supplement (PRO-STAT SUGAR FREE 64) Liqd Take 30 mLs by mouth 2 (two) times daily.   furosemide 20 MG tablet Commonly known as:  LASIX Take 1 tablet (20 mg total) by mouth daily.   gabapentin 100 MG capsule Commonly known as:  NEURONTIN Take 300 mg by mouth 2 (two) times daily.   HYDROcodone-acetaminophen 5-325 MG tablet Commonly known as:  NORCO/VICODIN Take 0.5 tablets by mouth 3 (three) times daily. 1/2 tablet three times daily for pain   ICAPS MV Tabs Take 1 tablet by mouth daily.   multivitamin with minerals tablet Take 1 tablet by mouth. Take one tablet once daily   metFORMIN 500 MG tablet Commonly known as:  GLUCOPHAGE Take 500 mg by mouth daily with breakfast.   silver sulfADIAZINE 1 % cream Commonly known as:  SILVADENE Apply 1 application topically. Apply cream twice daily to left hip wound   simvastatin 10 MG tablet Commonly known as:  ZOCOR Take 10 mg by mouth daily.   spironolactone 25 MG tablet Commonly known as:  ALDACTONE Take 25 mg by mouth daily.       Review of Systems  Constitutional: Positive for fatigue and unexpected weight change. Negative for activity change, appetite change, chills and fever.  HENT: Negative for congestion, ear discharge, ear pain, facial swelling, postnasal drip, rhinorrhea, sinus pain, sinus pressure, sneezing, sore throat and tinnitus.   Eyes: Negative for pain, discharge, itching and visual disturbance.  Respiratory: Negative for cough, chest tightness, shortness of breath and wheezing.   Cardiovascular: Negative for chest pain, palpitations and leg swelling.  Gastrointestinal: Negative for abdominal distention, abdominal pain, constipation, diarrhea, nausea and vomiting.  Endocrine: Negative for cold intolerance, heat intolerance, polydipsia, polyphagia and polyuria.  Genitourinary: Negative for dysuria, flank pain, frequency and urgency.  Musculoskeletal: Positive for gait problem.  Skin: Negative for color change,  pallor and rash.  Neurological: Negative for dizziness, seizures, syncope, light-headedness and headaches.       Generalized weakness   Hematological: Does not bruise/bleed easily.  Psychiatric/Behavioral: Negative for agitation, confusion, hallucinations and sleep disturbance. The patient is not nervous/anxious.     Immunization History  Administered Date(s) Administered  . DTaP 05/13/2005  . Influenza-Unspecified 02/11/2015, 02/22/2016  . PPD Test 12/08/2015, 12/22/2015  . Pneumococcal Conjugate-13 05/13/2014  . Pneumococcal Polysaccharide-23 05/13/2014  . Zoster 05/13/2014   Pertinent  Health Maintenance Due  Topic Date Due  . FOOT EXAM  09/12/1935  . OPHTHALMOLOGY EXAM  09/12/1935  . URINE MICROALBUMIN  09/12/1935  . PNA vac Low Risk Adult (2 of 2 - PPSV23) 05/14/2015  . INFLUENZA VACCINE  12/11/2016  . HEMOGLOBIN A1C  03/23/2017  . DEXA SCAN  Completed   Fall Risk  11/03/2015  Falls in the past year? No    Vitals:   12/04/16 1108  BP: 134/70  Pulse: 72  Resp: 18  Temp: 97.9 F (36.6 C)  SpO2: 96%  Weight: 144 lb 12.8 oz (65.7 kg)  Height: _0  (1.6 m)   Body mass index is 25.65 kg/m. Physical Exam  Constitutional: She is oriented to person, place, and time. She appears well-developed and well-nourished. No distress.  HENT:  Head: Normocephalic.  Mouth/Throat: Oropharynx is clear and moist. No oropharyngeal exudate.  Eyes: Pupils are equal, round, and reactive to light. Right eye exhibits no discharge. Left eye exhibits no discharge. No scleral icterus.  Neck: Normal range of motion. No JVD present. No thyromegaly present.  Cardiovascular: Normal rate, regular rhythm, normal heart sounds and intact distal pulses.  Exam reveals no gallop and no friction rub.   No murmur heard. Pulmonary/Chest: Effort normal and breath sounds normal. No respiratory distress. She has no wheezes. She has no rales.  Abdominal: Soft. Bowel sounds are normal. She exhibits no  distension. There is no tenderness. There is no rebound and no guarding.  Musculoskeletal: She exhibits no tenderness or deformity.  Unsteady gait uses walker.Moves x 4 extremities except right hip limited due to pain.   Lymphadenopathy:    She has no cervical adenopathy.  Neurological: She is oriented to person, place, and time. Coordination normal.  Skin: Skin is warm and dry. No rash noted. No erythema. No pallor.  Psychiatric: She has a normal mood and affect.    Labs reviewed:  Recent Labs  03/28/16 07/11/16  NA 141 142  K 4.1 4.0  BUN 52* 49*  CREATININE 1.2* 1.3*    Recent Labs  07/11/16  AST 21  ALT 10  ALKPHOS 36    Recent Labs  03/18/16 07/11/16 12/16/16  WBC 9.1 10.5 8.4  HGB 12.2 13.6 12.4  HCT 39 43 39  PLT 248 239 255   Lab Results  Component Value Date   TSH 1.94 07/11/2016   Lab Results  Component Value Date   HGBA1C 6.6 09/20/2016   Significant Diagnostic Results in last 30 days:  No results found.  Assessment/Plan 1. Generalized weakness Afebrile.Progressive weakness requiring more assistance with mobility. Exam findings negative for infectious etiology.Has had 4 pounds weight loss over one month. Will have PT/OT work with her for ROM and muscle strengthening. Continue with current protein supplements and follow up with facility registered dietician.    2. Unsteady gait Has required assistance with mobility. PT/OT as above for gait stability. Fall and safety precautions.   Family/ staff Communication: Reviewed plan of care with patient and facility Nurse supervisor  Labs/tests ordered: None   Sandrea Hughs, NP

## 2016-12-30 ENCOUNTER — Non-Acute Institutional Stay: Payer: Medicare Other

## 2016-12-30 DIAGNOSIS — Z0000 Encounter for general adult medical examination without abnormal findings: Secondary | ICD-10-CM

## 2016-12-30 NOTE — Progress Notes (Signed)
Subjective:   Yolanda [name] is a 83 y.o. female who presents for an Initial Medicare Annual Wellness Visit at [facility]    Objective:    Today's Vitals   12/30/16 1222 12/30/16 1224  BP: (!) 108/58   Pulse: 70   Temp: 98.1 F (36.7 C)   TempSrc: Oral   SpO2: 97%   Weight: 144 lb (65.3 kg)   Height: 5' 3"  (1.6 m)   PainSc:  3    Body mass index is 25.51 kg/m.   Current Medications (verified) Outpatient Encounter Prescriptions as of 12/30/2016  Medication Sig  . acetaminophen (TYLENOL) 325 MG tablet Take 650 mg by mouth every 4 (four) hours as needed.  Marland Kitchen acetaminophen (TYLENOL) 500 MG tablet Take 500 mg by mouth 2 (two) times daily.   . Amino Acids-Protein Hydrolys (FEEDING SUPPLEMENT, PRO-STAT SUGAR FREE 64,) LIQD Take 30 mLs by mouth 2 (two) times daily.   Marland Kitchen amoxicillin-clavulanate (AUGMENTIN) 875-125 MG tablet Take 1 tablet by mouth every 12 (twelve) hours. Stop date 12/18/16  . calcium carbonate (TUMS - DOSED IN MG ELEMENTAL CALCIUM) 500 MG chewable tablet Chew 1 tablet by mouth daily.   . furosemide (LASIX) 20 MG tablet Take 1 tablet (20 mg total) by mouth daily.  Marland Kitchen gabapentin (NEURONTIN) 100 MG capsule Take 300 mg by mouth 2 (two) times daily.   Marland Kitchen HYDROcodone-acetaminophen (NORCO/VICODIN) 5-325 MG tablet Take 0.5 tablets by mouth 3 (three) times daily. 1/2 tablet three times daily for pain  . Lidocaine 4 % PTCH Apply 1 application topically every 12 (twelve) hours. Right hip  . metFORMIN (GLUCOPHAGE) 500 MG tablet Take 500 mg by mouth daily with breakfast.   . Multiple Vitamins-Minerals (ICAPS MV) TABS Take 1 tablet by mouth daily.  . Multiple Vitamins-Minerals (MULTIVITAMIN WITH MINERALS) tablet Take 1 tablet by mouth. Take one tablet once daily  . simvastatin (ZOCOR) 10 MG tablet Take 10 mg by mouth daily.  Marland Kitchen spironolactone (ALDACTONE) 25 MG tablet Take 25 mg by mouth daily.   . [DISCONTINUED] silver sulfADIAZINE (SILVADENE) 1 % cream Apply 1  application topically. Apply cream twice daily to left hip wound   No facility-administered encounter medications on file as of 12/30/2016.     Allergies (verified) Epinephrine; Hydrocodone-acetaminophen; Labetalol; and Procaine   History: Past Medical History:  Diagnosis Date  . Acute blood loss anemia   . Allergy   . Asthma   . AVM (arteriovenous malformation) of colon with hemorrhage   . Bursitis of right hip   . Cataract   . CKD (chronic kidney disease), stage III 11/04/2015  . COLONIC POLYPS 03/26/2005   Qualifier: Diagnosis of  By: Talbert Cage CMA ([place]), June    . DIVERTICULOSIS, COLON 03/26/2005   Qualifier: Diagnosis of  By: Talbert Cage CMA ([place]), June    . DVT (deep venous thrombosis), left 11/03/2015  . Ecchymosis 12/16/2015  . Hyperglycemia 12/16/2015  . Hyperlipidemia   . Hypertension   . Multiple skin tears 12/16/2015  . Osteopenia   . Stasis edema of both lower extremities 11/04/2015  . Unstable gait 12/16/2015  . Urine incontinence 12/16/2015   Past Surgical History:  Procedure Laterality Date  . ABDOMINAL HYSTERECTOMY  2006   Dr. Maryelizabeth Rowan  . CATARACT EXTRACTION W/ INTRAOCULAR LENS IMPLANT Bilateral 1999/2003   Dr. Rutherford Guys  . COLONOSCOPY N/A 11/06/2015   Procedure: COLONOSCOPY;  Surgeon: Mauri Pole, MD;  Location: [facility] ENDOSCOPY;  Service: Endoscopy;  Laterality: N/A;  MAC  if available, otherwise moderate sedation   Family History  Problem Relation Age of Onset  . Heart attack Father 35   Social History   Occupational History  . retired Art gallery manager    Social History Main Topics  . Smoking status: Never Smoker  . Smokeless tobacco: Never Used  . Alcohol use No  . Drug use: No  . Sexual activity: No     Comment: was until a few years ago    Tobacco Counseling Counseling given: Not Answered   Activities of Daily Living In your present state of health, do you have any difficulty performing the following activities: 12/30/2016  Hearing? N    Vision? N  Difficulty concentrating or making decisions? N  Walking or climbing stairs? N  Dressing or bathing? Y  Doing errands, shopping? Y  Preparing Food and eating ? Y  Using the Toilet? N  In the past six months, have you accidently leaked urine? N  Do you have problems with loss of bowel control? N  Managing your Medications? Y  Managing your Finances? Y  Housekeeping or managing your Housekeeping? Y  Some recent data might be hidden    Immunizations and Health Maintenance Immunization History  Administered Date(s) Administered  . DTaP 05/13/2005  . Influenza-Unspecified 02/11/2015, 02/22/2016  . PPD Test 12/08/2015, 12/22/2015  . Pneumococcal Conjugate-13 05/13/2014  . Pneumococcal Polysaccharide-23 05/13/2014  . Zoster 05/13/2014   Health Maintenance Due  Topic Date Due  . FOOT EXAM  09/12/1935  . OPHTHALMOLOGY EXAM  09/12/1935  . URINE MICROALBUMIN  09/12/1937  . TETANUS/TDAP  09/13/1946  . PNA vac Low Risk Adult (2 of 2 - PPSV23) 05/14/2015  . INFLUENZA VACCINE  12/11/2016    Patient Care Team: Blanchie Serve, MD as PCP - General (Internal Medicine) Rutherford Guys, MD as Consulting Physician (Ophthalmology) Ngetich, Nelda Bucks, NP as Nurse Practitioner (Family Medicine)  Indicate any recent Medical Services you may have received from other than [facility] providers in the past year (date may be approximate).     Assessment:   This is a routine wellness examination for [facility].   Hearing/Vision screen No exam data present  Dietary issues and exercise activities discussed: Current Exercise Habits: Home exercise routine, Type of exercise: walking, Time (Minutes): 15, Frequency (Times/Week): 7, Weekly Exercise (Minutes/Week): 105, Intensity: Mild  Goals    None     Depression Screen PHQ 2/9 Scores 12/30/2016 11/03/2015  PHQ - 2 Score 0 0    Fall Risk Fall Risk  12/30/2016 11/03/2015  Falls in the past year? No No    Cognitive Function: MMSE - Mini Mental  State Exam 12/30/2016  Orientation to time 5  Orientation to Place 5  Registration 3  Attention/ Calculation 5  Recall 3  Language- name 2 objects 2  Language- repeat 1  Language- follow 3 step command 3  Language- read & follow direction 1  Write a sentence 1  Copy design 1  Total score 30        Screening Tests Health Maintenance  Topic Date Due  . FOOT EXAM  09/12/1935  . OPHTHALMOLOGY EXAM  09/12/1935  . URINE MICROALBUMIN  09/12/1937  . TETANUS/TDAP  09/13/1946  . PNA vac Low Risk Adult (2 of 2 - PPSV23) 05/14/2015  . INFLUENZA VACCINE  12/11/2016  . HEMOGLOBIN A1C  03/23/2017  . DEXA SCAN  Completed      Plan:  I have personally reviewed and addressed the Medicare Annual Wellness questionnaire and have  noted the following in the patient's chart:  A. Medical and social history B. Use of alcohol, tobacco or illicit drugs  C. Current medications and supplements D. Functional ability and status E.  Nutritional status F.  Physical activity G. Advance directives H. List of other physicians I.  Hospitalizations, surgeries, and ER visits in previous 12 months J.  [place] to include hearing, vision, cognitive, depression L. Referrals and appointments - none  In addition, I have reviewed and discussed with patient certain preventive protocols, quality metrics, and best practice recommendations. A written personalized care plan for preventive services as well as general preventive health recommendations were provided to patient.  See attached scanned questionnaire for additional information.   Signed,   Rich Reining, RN Nurse Health Advisor   Quick Notes   Health Maintenance: TDAP, eye and foot exam due     Abnormal Screen: MMSE 30/30. Passed clock drawing     Patient Concerns: None     Nurse Concerns: None

## 2016-12-30 NOTE — Patient Instructions (Signed)
Ms. Yolanda [name] , Thank you for taking time to come for your Medicare Wellness Visit. I appreciate your ongoing commitment to your health goals. Please review the following plan we discussed and let me know if I can assist you in the future.   Screening recommendations/referrals: Colonoscopy excluded, pt over age 81 Mammogram excluded, pt over age 24 Bone Density up to date Recommended yearly ophthalmology/optometry visit for glaucoma screening and checkup Recommended yearly dental visit for hygiene and checkup  Vaccinations: Influenza vaccine due 2018 fall season Pneumococcal vaccine up to date Tdap vaccine due Shingles vaccine not in records    Advanced directives: In Chart  Conditions/risks identified: None  Next appointment: Dr. Bubba Camp makes rounds   Preventive Care 22 Years and Older, Female Preventive care refers to lifestyle choices and visits with your health care provider that can promote health and wellness. What does preventive care include?  A yearly physical exam. This is also called an annual well check.  Dental exams once or twice a year.  Routine eye exams. Ask your health care provider how often you should have your eyes checked.  Personal lifestyle choices, including:  Daily care of your teeth and gums.  Regular physical activity.  Eating a healthy diet.  Avoiding tobacco and drug use.  Limiting alcohol use.  Practicing safe sex.  Taking low-dose aspirin every day.  Taking vitamin and mineral supplements as recommended by your health care provider. What happens during an annual well check? The services and screenings done by your health care provider during your annual well check will depend on your age, overall health, lifestyle risk factors, and family history of disease. Counseling  Your health care provider may ask you questions about your:  Alcohol use.  Tobacco use.  Drug use.  Emotional well-being.  Home and relationship  well-being.  Sexual activity.  Eating habits.  History of falls.  Memory and ability to understand (cognition).  Work and work Statistician.  Reproductive health. Screening  You may have the following tests or measurements:  Height, weight, and BMI.  Blood pressure.  Lipid and cholesterol levels. These may be checked every 5 years, or more frequently if you are over 31 years old.  Skin check.  Lung cancer screening. You may have this screening every year starting at age 18 if you have a 30-pack-year history of smoking and currently smoke or have quit within the past 15 years.  Fecal occult blood test (FOBT) of the stool. You may have this test every year starting at age 61.  Flexible sigmoidoscopy or colonoscopy. You may have a sigmoidoscopy every 5 years or a colonoscopy every 10 years starting at age 29.  Hepatitis C blood test.  Hepatitis B blood test.  Sexually transmitted disease (STD) testing.  Diabetes screening. This is done by checking your blood sugar (glucose) after you have not eaten for a while (fasting). You may have this done every 1-3 years.  Bone density scan. This is done to screen for osteoporosis. You may have this done starting at age 86.  Mammogram. This may be done every 1-2 years. Talk to your health care provider about how often you should have regular mammograms. Talk with your health care provider about your test results, treatment options, and if necessary, the need for more tests. Vaccines  Your health care provider may recommend certain vaccines, such as:  Influenza vaccine. This is recommended every year.  Tetanus, diphtheria, and acellular pertussis (Tdap, Td) vaccine. You may need a Td  booster every 10 years.  Zoster vaccine. You may need this after age 55.  Pneumococcal 13-valent conjugate (PCV13) vaccine. One dose is recommended after age 47.  Pneumococcal polysaccharide (PPSV23) vaccine. One dose is recommended after age  39. Talk to your health care provider about which screenings and vaccines you need and how often you need them. This information is not intended to replace advice given to you by your health care provider. Make sure you discuss any questions you have with your health care provider. Document Released: 05/26/2015 Document Revised: 01/17/2016 Document Reviewed: 02/28/2015 Elsevier Interactive Patient Education  2017 [place] Prevention in the Home Falls can cause injuries. They can happen to people of all ages. There are many things you can do to make your home safe and to help prevent falls. What can I do on the outside of my home?  Regularly fix the edges of walkways and driveways and fix any cracks.  Remove anything that might make you trip as you walk through a door, such as a raised step or threshold.  Trim any bushes or trees on the path to your home.  Use bright outdoor lighting.  Clear any walking paths of anything that might make someone trip, such as rocks or tools.  Regularly check to see if handrails are loose or broken. Make sure that both sides of any steps have handrails.  Any raised decks and porches should have guardrails on the edges.  Have any leaves, snow, or ice cleared regularly.  Use sand or salt on walking paths during winter.  Clean up any spills in your garage right away. This includes oil or grease spills. What can I do in the bathroom?  Use night lights.  Install grab bars by the toilet and in the tub and shower. Do not use towel bars as grab bars.  Use non-skid mats or decals in the tub or shower.  If you need to sit down in the shower, use a plastic, non-slip stool.  Keep the floor dry. Clean up any water that spills on the floor as soon as it happens.  Remove soap buildup in the tub or shower regularly.  Attach bath mats securely with double-sided non-slip rug tape.  Do not have throw rugs and other things on the floor that can make  you trip. What can I do in the bedroom?  Use night lights.  Make sure that you have a light by your bed that is easy to reach.  Do not use any sheets or blankets that are too big for your bed. They should not hang down onto the floor.  Have a firm chair that has side arms. You can use this for support while you get dressed.  Do not have throw rugs and other things on the floor that can make you trip. What can I do in the kitchen?  Clean up any spills right away.  Avoid walking on wet floors.  Keep items that you use a lot in easy-to-reach places.  If you need to reach something above you, use a strong step stool that has a grab bar.  Keep electrical cords out of the way.  Do not use floor polish or wax that makes floors slippery. If you must use wax, use non-skid floor wax.  Do not have throw rugs and other things on the floor that can make you trip. What can I do with my stairs?  Do not leave any items on the stairs.  Make sure that there are handrails on both sides of the stairs and use them. Fix handrails that are broken or loose. Make sure that handrails are as long as the stairways.  Check any carpeting to make sure that it is firmly attached to the stairs. Fix any carpet that is loose or worn.  Avoid having throw rugs at the top or bottom of the stairs. If you do have throw rugs, attach them to the floor with carpet tape.  Make sure that you have a light switch at the top of the stairs and the bottom of the stairs. If you do not have them, ask someone to add them for you. What else can I do to help prevent falls?  Wear shoes that:  Do not have high heels.  Have rubber bottoms.  Are comfortable and fit you well.  Are closed at the toe. Do not wear sandals.  If you use a stepladder:  Make sure that it is fully opened. Do not climb a closed stepladder.  Make sure that both sides of the stepladder are locked into place.  Ask someone to hold it for you, if  possible.  Clearly mark and make sure that you can see:  Any grab bars or handrails.  First and last steps.  Where the edge of each step is.  Use tools that help you move around (mobility aids) if they are needed. These include:  Canes.  Walkers.  Scooters.  Crutches.  Turn on the lights when you go into a dark area. Replace any light bulbs as soon as they burn out.  Set up your furniture so you have a clear path. Avoid moving your furniture around.  If any of your floors are uneven, fix them.  If there are any pets around you, be aware of where they are.  Review your medicines with your doctor. Some medicines can make you feel dizzy. This can increase your chance of falling. Ask your doctor what other things that you can do to help prevent falls. This information is not intended to replace advice given to you by your health care provider. Make sure you discuss any questions you have with your health care provider. Document Released: 02/23/2009 Document Revised: 10/05/2015 Document Reviewed: 06/03/2014 Elsevier Interactive Patient Education  2017 [employer].

## 2017-01-03 DIAGNOSIS — E119 Type 2 diabetes mellitus without complications: Secondary | ICD-10-CM | POA: Diagnosis not present

## 2017-01-03 DIAGNOSIS — L89223 Pressure ulcer of left hip, stage 3: Secondary | ICD-10-CM | POA: Diagnosis not present

## 2017-01-03 DIAGNOSIS — Z86718 Personal history of other venous thrombosis and embolism: Secondary | ICD-10-CM | POA: Diagnosis not present

## 2017-01-03 DIAGNOSIS — I10 Essential (primary) hypertension: Secondary | ICD-10-CM | POA: Diagnosis not present

## 2017-01-03 DIAGNOSIS — L89610 Pressure ulcer of right heel, unstageable: Secondary | ICD-10-CM | POA: Diagnosis not present

## 2017-01-07 ENCOUNTER — Encounter: Payer: Medicare Other | Admitting: Internal Medicine

## 2017-01-07 DIAGNOSIS — H9113 Presbycusis, bilateral: Secondary | ICD-10-CM | POA: Diagnosis not present

## 2017-01-07 DIAGNOSIS — L0201 Cutaneous abscess of face: Secondary | ICD-10-CM | POA: Diagnosis not present

## 2017-01-08 ENCOUNTER — Encounter: Payer: Self-pay | Admitting: Internal Medicine

## 2017-01-08 ENCOUNTER — Non-Acute Institutional Stay: Payer: Medicare Other | Admitting: Internal Medicine

## 2017-01-08 VITALS — BP 132/78 | HR 88 | Temp 97.7°F | Resp 16 | Ht 63.0 in | Wt 148.0 lb

## 2017-01-08 DIAGNOSIS — E1121 Type 2 diabetes mellitus with diabetic nephropathy: Secondary | ICD-10-CM | POA: Diagnosis not present

## 2017-01-08 DIAGNOSIS — E1165 Type 2 diabetes mellitus with hyperglycemia: Secondary | ICD-10-CM

## 2017-01-08 DIAGNOSIS — I10 Essential (primary) hypertension: Secondary | ICD-10-CM

## 2017-01-08 DIAGNOSIS — G8929 Other chronic pain: Secondary | ICD-10-CM | POA: Diagnosis not present

## 2017-01-08 DIAGNOSIS — N183 Chronic kidney disease, stage 3 unspecified: Secondary | ICD-10-CM

## 2017-01-08 DIAGNOSIS — M5416 Radiculopathy, lumbar region: Secondary | ICD-10-CM

## 2017-01-08 DIAGNOSIS — R2681 Unsteadiness on feet: Secondary | ICD-10-CM | POA: Diagnosis not present

## 2017-01-08 DIAGNOSIS — IMO0002 Reserved for concepts with insufficient information to code with codable children: Secondary | ICD-10-CM

## 2017-01-08 DIAGNOSIS — R220 Localized swelling, mass and lump, head: Secondary | ICD-10-CM

## 2017-01-08 DIAGNOSIS — I5032 Chronic diastolic (congestive) heart failure: Secondary | ICD-10-CM | POA: Diagnosis not present

## 2017-01-08 DIAGNOSIS — E785 Hyperlipidemia, unspecified: Secondary | ICD-10-CM | POA: Diagnosis not present

## 2017-01-08 NOTE — Progress Notes (Signed)
Location:  [place] of Service:  Clinic (12) Provider:  Blanchie Serve, MD  Blanchie Serve, MD  Patient Care Team: Blanchie Serve, MD as PCP - General (Internal Medicine) Rutherford Guys, MD as Consulting Physician (Ophthalmology) Ngetich, Nelda Bucks, NP as Nurse Practitioner [facility] Medicine)  Extended Emergency Contact Information Primary Emergency Contact: Strandberg,Leonard Address: [address] [place] [address] Johnnette Litter of [place] Phone: [phone] Work Phone: [phone] Mobile Phone: [phone] Relation: Son Secondary Emergency Contact: [place] of [place] Phone: [phone] Relation: Friend  Code Status:  DNR Goals of care: Advanced Directive information Advanced Directives 12/30/2016  Does Patient Have a Medical Advance Directive? Yes  Type of Advance Directive Living will;Healthcare Power of [place];Out of facility DNR (pink MOST or yellow form)  Does patient want to make changes to medical advance directive? No - Patient declined  Copy of [place] in Chart? Yes  Would patient like information on creating a medical advance directive? -  Pre-existing out of facility DNR order (yellow form or pink MOST form) Pink MOST form placed in chart (order not valid for inpatient use);Yellow form placed in chart (order not valid for inpatient use)     Chief Complaint  Patient presents with  . Medical Management of Chronic Issues    routine follow up    HPI:  Pt is a 83 y.o. female seen today for routine visit.  Left submandibular swelling- has subsided. Denies any pain or fever.   Lumbar degeneration with sciatica- currently controlled pain. Movement can precipitate at times. Currently on tylenol 500 mg bid and 650 mg q4h prn, plan is for steroid injection to lumbar area on 01/17/17. Also taking gabapentin, lidocaine patch and norco 5-325 mg 1/2 tab tid.   Hyperlipidemia-  currently on simvastatin, tolerating well  HTN- currently on spironolactone and furosemide  Chronic diastolic CHF- denies dyspnea. Has leg edema that has subsided some. Not using compression stockings. Currently on spironolactone and furosemide.   DM type 2 with ckd- cbg controlled reading. On metformin   Past Medical History:  Diagnosis Date  . Acute blood loss anemia   . Allergy   . Asthma   . AVM (arteriovenous malformation) of colon with hemorrhage   . Bursitis of right hip   . Cataract   . CKD (chronic kidney disease), stage III 11/04/2015  . COLONIC POLYPS 03/26/2005   Qualifier: Diagnosis of  By: Talbert Cage CMA ([place]), June    . DIVERTICULOSIS, COLON 03/26/2005   Qualifier: Diagnosis of  By: Talbert Cage CMA ([place]), June    . DVT (deep venous thrombosis), left 11/03/2015  . Ecchymosis 12/16/2015  . Hyperglycemia 12/16/2015  . Hyperlipidemia   . Hypertension   . Multiple skin tears 12/16/2015  . Osteopenia   . Stasis edema of both lower extremities 11/04/2015  . Unstable gait 12/16/2015  . Urine incontinence 12/16/2015   Past Surgical History:  Procedure Laterality Date  . ABDOMINAL HYSTERECTOMY  2006   Dr. Maryelizabeth Rowan  . CATARACT EXTRACTION W/ INTRAOCULAR LENS IMPLANT Bilateral 1999/2003   Dr. Rutherford Guys  . COLONOSCOPY N/A 11/06/2015   Procedure: COLONOSCOPY;  Surgeon: Mauri Pole, MD;  Location: [facility];  Service: Endoscopy;  Laterality: N/A;  MAC if available, otherwise moderate sedation    Allergies  Allergen Reactions  . Epinephrine Nausea Only    Unknown.  Marland Kitchen Hydrocodone-Acetaminophen     REACTION:  nausea  . Labetalol   . Procaine     Outpatient Encounter Prescriptions as of 01/08/2017  Medication Sig  . acetaminophen (TYLENOL) 325 MG tablet Take 650 mg by mouth every 4 (four) hours as needed.  Marland Kitchen acetaminophen (TYLENOL) 500 MG tablet Take 500 mg by mouth 2 (two) times daily.   . Amino Acids-Protein Hydrolys (FEEDING SUPPLEMENT, PRO-STAT SUGAR FREE 64,) LIQD  Take 30 mLs by mouth 2 (two) times daily.   . calcium carbonate (TUMS - DOSED IN MG ELEMENTAL CALCIUM) 500 MG chewable tablet Chew 1 tablet by mouth daily.   . furosemide (LASIX) 20 MG tablet Take 1 tablet (20 mg total) by mouth daily.  Marland Kitchen gabapentin (NEURONTIN) 100 MG capsule Take 300 mg by mouth 2 (two) times daily.   Marland Kitchen HYDROcodone-acetaminophen (NORCO/VICODIN) 5-325 MG tablet Take 0.5 tablets by mouth 3 (three) times daily. 1/2 tablet three times daily for pain  . Lidocaine 4 % PTCH Apply 1 application topically every 12 (twelve) hours. Right hip  . loperamide (IMODIUM A-D) 2 MG tablet Take 2 mg by mouth as needed for diarrhea or loose stools.  . metFORMIN (GLUCOPHAGE) 500 MG tablet Take 500 mg by mouth daily with breakfast.   . Multiple Vitamins-Minerals (ICAPS MV) TABS Take 1 tablet by mouth daily.  . Multiple Vitamins-Minerals (MULTIVITAMIN WITH MINERALS) tablet Take 1 tablet by mouth. Take one tablet once daily  . simvastatin (ZOCOR) 10 MG tablet Take 10 mg by mouth daily.  Marland Kitchen spironolactone (ALDACTONE) 25 MG tablet Take 25 mg by mouth daily.   . [DISCONTINUED] amoxicillin-clavulanate (AUGMENTIN) 875-125 MG tablet Take 1 tablet by mouth every 12 (twelve) hours. Stop date 12/18/16   No facility-administered encounter medications on file as of 01/08/2017.     Review of Systems  Constitutional: Negative for appetite change, chills, diaphoresis, fatigue, fever and unexpected weight change.  HENT: Negative for congestion, ear discharge, ear pain, facial swelling, hearing loss, mouth sores, nosebleeds, rhinorrhea, sore throat, trouble swallowing and voice change.        Mass to left lower jawline  Eyes: Negative for visual disturbance.       Has corrective lenses  Respiratory: Negative for cough, shortness of breath and wheezing.   Cardiovascular: Positive for leg swelling. Negative for chest pain and palpitations.  Gastrointestinal: Negative for abdominal pain, blood in stool, constipation,  diarrhea, nausea and vomiting.  Genitourinary: Positive for frequency. Negative for dysuria.  Musculoskeletal: Positive for arthralgias, back pain and gait problem. Negative for joint swelling.  Skin: Positive for wound. Negative for rash.  Neurological: Negative for dizziness, tremors, seizures, syncope, speech difficulty, numbness and headaches.  Hematological: Does not bruise/bleed easily.  Psychiatric/Behavioral: Negative for confusion and sleep disturbance. The patient is not nervous/anxious.     Immunization History  Administered Date(s) Administered  . DTaP 05/13/2005  . Influenza-Unspecified 02/11/2015, 02/22/2016  . PPD Test 12/08/2015, 12/22/2015  . Pneumococcal Conjugate-13 05/13/2014  . Pneumococcal Polysaccharide-23 05/13/2014  . Zoster 05/13/2014   Pertinent  Health Maintenance Due  Topic Date Due  . FOOT EXAM  09/12/1935  . OPHTHALMOLOGY EXAM  09/12/1935  . URINE MICROALBUMIN  09/12/1935  . PNA vac Low Risk Adult (2 of 2 - PPSV23) 05/14/2015  . INFLUENZA VACCINE  12/11/2016  . HEMOGLOBIN A1C  03/23/2017  . DEXA SCAN  Completed   Fall Risk  12/30/2016 11/03/2015  Falls in the past year? No No   Functional Status Survey:    Vitals:   01/08/17 1127  BP: 132/78  Pulse: 88  Resp: 16  Temp: 97.7 F (36.5 C)  TempSrc: Oral  SpO2: 97%  Weight: 148 lb (67.1 kg)  Height: _0  (1.6 m)   Body mass index is 26.22 kg/m.   Wt Readings from Last 3 Encounters:  01/08/17 148 lb (67.1 kg)  12/30/16 144 lb (65.3 kg)  12/16/16 143 lb 12.8 oz (65.2 kg)   Physical Exam  Constitutional: She is oriented to person, place, and time. She appears well-developed and well-nourished. No distress.  HENT:  Head: Normocephalic and atraumatic.  Nose: Nose normal.  Mouth/Throat: Oropharynx is clear and moist. No oropharyngeal exudate.  Cerumen to both ears  Eyes: Pupils are equal, round, and reactive to light. Conjunctivae and EOM are normal. Right eye exhibits no discharge.  Left eye exhibits no discharge. No scleral icterus.  Neck: Normal range of motion. No JVD present. No tracheal deviation present. No thyromegaly present.  Left submandibular palpable mass, 1 x 0.5 cm, non tender, mobile , no other palpable cervical Lymph node  Cardiovascular: Normal rate and regular rhythm.   Pulmonary/Chest: Effort normal and breath sounds normal. No respiratory distress. She has no wheezes. She has no rales.  Abdominal: Soft. Bowel sounds are normal. There is no tenderness. There is no rebound and no guarding.  Musculoskeletal: Normal range of motion. She exhibits edema.  Unsteady gait, uses walker  Lymphadenopathy:    She has cervical adenopathy.  Neurological: She is alert and oriented to person, place, and time.  Skin: Skin is warm and dry. She is not diaphoretic.  Chronic skin changes to both legs, diminished dorsalis pedis  Psychiatric: She has a normal mood and affect. Her behavior is normal.    Labs reviewed:  Recent Labs  03/28/16 07/11/16  NA 141 142  K 4.1 4.0  BUN 52* 49*  CREATININE 1.2* 1.3*    Recent Labs  07/11/16  AST 21  ALT 10  ALKPHOS 36    Recent Labs  03/18/16 07/11/16 12/16/16  WBC 9.1 10.5 8.4  HGB 12.2 13.6 12.4  HCT 39 43 39  PLT 248 239 255   Lab Results  Component Value Date   TSH 1.94 07/11/2016   Lab Results  Component Value Date   HGBA1C 6.6 09/20/2016   No results found for: CHOL, HDL, LDLCALC, LDLDIRECT, TRIG, CHOLHDL  Significant Diagnostic Results in last 30 days:  No results found.  Assessment/Plan  1. Chronic diastolic congestive heart failure (HCC) D/c spironolactone with soft BP reading. No systolic dysfunction on review of echocardiogram. Continue furosemide. Check bmp. Advised to apply compression stockings - CMP with eGFR; Future - Lipid Panel; Future - TSH; Future  2. Essential hypertension Continue furosemide current regimen, d/c spironolocatone  3. Uncontrolled type 2 diabetes mellitus with  diabetic nephropathy, without long-term current use of insulin (HCC) Check a1c, continue metformin, continue cbg check. a1c goal <7 and if a1c is below 7, d/c metformin. Check lipid and TSH - Lipid Panel; Future - Hemoglobin A1c; Future - TSH; Future  4. Chronic radicular pain of lower back Followed by orthopedic. Continue hydrocodone-apap and tylenol with lidocaine patch, fall precautions  5. CKD (chronic kidney disease), stage III Monitor bmp, avoid NSAIDs - TSH; Future  6. Hyperlipidemia, unspecified hyperlipidemia type Continue simvastatin - Lipid Panel; Future - TSH; Future  7. Unstable gait Walker to be used all the time. Fall prevention  8. Mass of left submandibular region Decreased in size, resolving. non tender, completed course of augmentin and clindamycin. Seen  by ENT. Monitor clinically   Family/ staff Communication: reviewed care plan with patient and charge nurse.    Labs/tests ordered: as above  Follow up- 2 weeks for BP and leg edema  Spent 40 minutes with patient of which more than 50% related to direct patient care.    Blanchie Serve, MD Internal Medicine [facility] [address] [place], [place] [address] Cell Phone (Monday-Friday 8 am - 5 pm): [phone] On Call: [phone] and follow prompts after 5 pm and on weekends Office Phone: [phone] Office Fax: [phone]

## 2017-01-09 ENCOUNTER — Encounter: Payer: Medicare Other | Admitting: Internal Medicine

## 2017-01-15 ENCOUNTER — Other Ambulatory Visit: Payer: Self-pay | Admitting: *Deleted

## 2017-01-16 DIAGNOSIS — E039 Hypothyroidism, unspecified: Secondary | ICD-10-CM | POA: Diagnosis not present

## 2017-01-16 DIAGNOSIS — E782 Mixed hyperlipidemia: Secondary | ICD-10-CM | POA: Diagnosis not present

## 2017-01-16 DIAGNOSIS — R739 Hyperglycemia, unspecified: Secondary | ICD-10-CM | POA: Diagnosis not present

## 2017-01-16 DIAGNOSIS — I10 Essential (primary) hypertension: Secondary | ICD-10-CM | POA: Diagnosis not present

## 2017-01-16 LAB — LIPID PANEL
CHOLESTEROL: 175 (ref 0–200)
HDL: 83 — AB (ref 35–70)
LDL Cholesterol: 75
Triglycerides: 86 (ref 40–160)

## 2017-01-16 LAB — BASIC METABOLIC PANEL
BUN: 27 — AB (ref 4–21)
Creatinine: 1.2 — AB (ref 0.5–1.1)
Glucose: 77
POTASSIUM: 4.1 (ref 3.4–5.3)
Sodium: 141 (ref 137–147)

## 2017-01-16 LAB — TSH: TSH: 1.84 (ref 0.41–5.90)

## 2017-01-16 LAB — HEPATIC FUNCTION PANEL
ALT: 15 (ref 7–35)
AST: 25 (ref 13–35)
Alkaline Phosphatase: 43 (ref 25–125)
Bilirubin, Total: 0.5

## 2017-01-16 LAB — HEMOGLOBIN A1C: HEMOGLOBIN A1C: 6.1

## 2017-01-17 ENCOUNTER — Encounter: Payer: Self-pay | Admitting: Family

## 2017-01-17 ENCOUNTER — Non-Acute Institutional Stay: Payer: Medicare Other | Admitting: Family

## 2017-01-17 DIAGNOSIS — E1165 Type 2 diabetes mellitus with hyperglycemia: Secondary | ICD-10-CM | POA: Diagnosis not present

## 2017-01-17 DIAGNOSIS — M5136 Other intervertebral disc degeneration, lumbar region: Secondary | ICD-10-CM | POA: Diagnosis not present

## 2017-01-17 DIAGNOSIS — I5032 Chronic diastolic (congestive) heart failure: Secondary | ICD-10-CM

## 2017-01-17 DIAGNOSIS — E1122 Type 2 diabetes mellitus with diabetic chronic kidney disease: Secondary | ICD-10-CM

## 2017-01-17 DIAGNOSIS — N183 Chronic kidney disease, stage 3 unspecified: Secondary | ICD-10-CM

## 2017-01-17 DIAGNOSIS — IMO0002 Reserved for concepts with insufficient information to code with codable children: Secondary | ICD-10-CM

## 2017-01-17 DIAGNOSIS — M5137 Other intervertebral disc degeneration, lumbosacral region: Secondary | ICD-10-CM | POA: Diagnosis not present

## 2017-01-17 DIAGNOSIS — M545 Low back pain: Secondary | ICD-10-CM | POA: Diagnosis not present

## 2017-01-17 DIAGNOSIS — E782 Mixed hyperlipidemia: Secondary | ICD-10-CM

## 2017-01-17 DIAGNOSIS — I130 Hypertensive heart and chronic kidney disease with heart failure and stage 1 through stage 4 chronic kidney disease, or unspecified chronic kidney disease: Secondary | ICD-10-CM | POA: Diagnosis not present

## 2017-01-17 MED ORDER — METFORMIN HCL 500 MG PO TABS: 250.0000 mg | ORAL_TABLET | Freq: Every day | ORAL | Status: DC

## 2017-01-17 MED ORDER — SIMVASTATIN 10 MG PO TABS: 5.0000 mg | ORAL_TABLET | Freq: Every day | ORAL | Status: DC

## 2017-01-19 NOTE — Progress Notes (Signed)
Location:  [place] Room Number: 23 Place of Service:  ALF [phone]) Provider: Casaundra Takacs FNP-C  Yolanda Serve, MD  Patient Care Team: Yolanda Serve, MD as PCP - General (Internal Medicine) Rutherford Guys, MD as Consulting Physician (Ophthalmology) Tyniya Kuyper, Nelda Bucks, NP as Nurse Practitioner [facility] Medicine)  Extended Emergency Contact Information Primary Emergency Contact: Yolanda [name] Address: [address] [place] [address] Yolanda [name] Phone: [phone] Work Phone: [phone] Mobile Phone: [phone] Relation: Son Secondary Emergency Contact: [place] of [place] Phone: [phone] Relation: Friend  Code Status:  DNR Goals of care: Advanced Directive information Advanced Directives 01/17/2017  Does Patient Have a Medical Advance Directive? Yes  Type of Advance Directive Out of facility DNR (pink MOST or yellow form);Living will;Healthcare Power of Attorney  Does patient want to make changes to medical advance directive? -  Copy of [place] in Chart? Yes  Would patient like information on creating a medical advance directive? -  Pre-existing out of facility DNR order (yellow form or pink MOST form) Yellow form placed in chart (order not valid for inpatient use);Pink MOST form placed in chart (order not valid for inpatient use)     Chief Complaint  Patient presents with  . Acute Visit    abnormal labs    HPI:  Pt is a 83 y.o. [name] seen today at [facility] for an acute visit for evaluation of lab results.She has a significant medical history of HTN, CHF, CKD stage 3,hyperlipidemia, Type 2 DM among other conditions.She is seen in her room today.She states just returned from [place] had injection to her back for chronic lower back pain.Her recent lab results showed BUN 27,CR1.17,Hgb A1C 6.1, LDL 83. She is currently on metformin  500 mg Tablet daily and also taking Simvastatin 10 mg tablet daily. Her CBG's log reviewed CBG readings ranging in the 80's-100's.she denies any signs or symptoms of hypo/hyperglycemia.     Past Medical History:  Diagnosis Date  . Acute blood loss anemia   . Allergy   . Asthma   . AVM (arteriovenous malformation) of colon with hemorrhage   . Bursitis of right hip   . Cataract   . CKD (chronic kidney disease), stage III 11/04/2015  . COLONIC POLYPS 03/26/2005   Qualifier: Diagnosis of  By: Talbert Cage CMA ([place]), June    . DIVERTICULOSIS, COLON 03/26/2005   Qualifier: Diagnosis of  By: Talbert Cage CMA ([place]), June    . DVT (deep venous thrombosis), left 11/03/2015  . Ecchymosis 12/16/2015  . Hyperglycemia 12/16/2015  . Hyperlipidemia   . Hypertension   . Multiple skin tears 12/16/2015  . Osteopenia   . Stasis edema of both lower extremities 11/04/2015  . Unstable gait 12/16/2015  . Urine incontinence 12/16/2015   Past Surgical History:  Procedure Laterality Date  . ABDOMINAL HYSTERECTOMY  2006   Dr. Maryelizabeth Rowan  . CATARACT EXTRACTION W/ INTRAOCULAR LENS IMPLANT Bilateral 1999/2003   Dr. Rutherford Guys  . COLONOSCOPY N/A 11/06/2015   Procedure: COLONOSCOPY;  Surgeon: Yolanda Pole, MD;  Location: [facility];  Service: Endoscopy;  Laterality: N/A;  MAC if available, otherwise moderate sedation    Allergies  Allergen Reactions  . Epinephrine Nausea Only    Unknown.  Marland Kitchen Hydrocodone-Acetaminophen     REACTION: nausea  . Labetalol   . Procaine     Outpatient Encounter Prescriptions as  of 01/17/2017  Medication Sig  . acetaminophen (TYLENOL) 325 MG tablet Take 650 mg by mouth every 4 (four) hours as needed.  Marland Kitchen acetaminophen (TYLENOL) 500 MG tablet Take 500 mg by mouth 2 (two) times daily.   . calcium carbonate (TUMS - DOSED IN MG ELEMENTAL CALCIUM) 500 MG chewable tablet Chew 1 tablet by mouth daily.   . furosemide (LASIX) 20 MG tablet Take 1 tablet (20 mg total) by mouth daily.  Marland Kitchen  gabapentin (NEURONTIN) 100 MG capsule Take 300 mg by mouth 2 (two) times daily.   Marland Kitchen HYDROcodone-acetaminophen (NORCO/VICODIN) 5-325 MG tablet Take 0.5 tablets by mouth 3 (three) times daily. 1/2 tablet three times daily for pain  . Lidocaine 4 % PTCH Apply 1 application topically every 12 (twelve) hours. Right hip  . loperamide (IMODIUM A-D) 2 MG tablet Take 2 mg by mouth as needed for diarrhea or loose stools.  . metFORMIN (GLUCOPHAGE) 500 MG tablet Take 0.5 tablets (250 mg total) by mouth daily with breakfast.  . Multiple Vitamins-Minerals (ICAPS MV) TABS Take 1 tablet by mouth daily.  . Multiple Vitamins-Minerals (MULTIVITAMIN WITH MINERALS) tablet Take 1 tablet by mouth. Take one tablet once daily  . simvastatin (ZOCOR) 10 MG tablet Take 0.5 tablets (5 mg total) by mouth daily.  . [DISCONTINUED] metFORMIN (GLUCOPHAGE) 500 MG tablet Take 500 mg by mouth daily with breakfast.   . [DISCONTINUED] simvastatin (ZOCOR) 10 MG tablet Take 10 mg by mouth daily.   No facility-administered encounter medications on file as of 01/17/2017.     Review of Systems  Constitutional: Negative for activity change, appetite change, chills, fatigue and fever.  HENT: Negative for congestion, rhinorrhea, sinus pain, sinus pressure, sneezing and sore throat.   Eyes: Negative for pain, discharge, redness and itching.  Respiratory: Negative for cough, chest tightness, shortness of breath and wheezing.   Cardiovascular: Positive for leg swelling. Negative for chest pain and palpitations.  Gastrointestinal: Negative for abdominal distention, abdominal pain, constipation, diarrhea, nausea and vomiting.  Endocrine: Negative for cold intolerance, heat intolerance, polydipsia, polyphagia and polyuria.  Genitourinary: Negative for dysuria, flank pain, frequency and urgency.  Musculoskeletal: Positive for gait problem.       Chronic lower back pain follows up with [facility] seen prior to visit today.    Skin: Negative for color change, pallor and rash.       Left mandibular mass progressive healing.   Neurological: Negative for dizziness, seizures, light-headedness and headaches.  Psychiatric/Behavioral: Negative for agitation, confusion and sleep disturbance. The patient is not nervous/anxious.     Immunization History  Administered Date(s) Administered  . DTaP 05/13/2005  . Influenza-Unspecified 02/11/2015, 02/22/2016  . PPD Test 12/08/2015, 12/22/2015  . Pneumococcal Conjugate-13 05/13/2014  . Pneumococcal Polysaccharide-23 05/13/2014  . Td 12/11/2005  . Zoster 05/13/2014   Pertinent  Health Maintenance Due  Topic Date Due  . FOOT EXAM  09/12/1935  . OPHTHALMOLOGY EXAM  09/12/1935  . URINE MICROALBUMIN  09/12/1935  . PNA vac Low Risk Adult (2 of 2 - PPSV23) 05/14/2015  . INFLUENZA VACCINE  12/11/2016  . HEMOGLOBIN A1C  07/16/2017  . DEXA SCAN  Completed   Fall Risk  12/30/2016 11/03/2015  Falls in the past year? No No    Vitals:   01/17/17 1154  BP: 130/70  Pulse: 76  Resp: 20  Temp: (!) 97.2 F (36.2 C)  SpO2: 98%  Weight: 149 lb 3.2 oz (67.7 kg)  Height: _0  (1.6 m)  Body mass index is 26.43 kg/m. Physical Exam  Constitutional: She is oriented to person, place, and time. She appears well-developed and well-nourished.  Elderly   HENT:  Head: Normocephalic.  Mouth/Throat: Oropharynx is clear and moist. No oropharyngeal exudate.  Eyes: Pupils are equal, round, and reactive to light. Conjunctivae and EOM are normal. Right eye exhibits no discharge. Left eye exhibits no discharge. No scleral icterus.  Neck: Normal range of motion. No JVD present. No thyromegaly present.  Cardiovascular: Normal rate, regular rhythm, normal heart sounds and intact distal pulses.  Exam reveals no gallop and no friction rub.   No murmur heard. Pulmonary/Chest: Effort normal and breath sounds normal. No respiratory distress. She has no wheezes. She has no rales.  Abdominal: Soft.  Bowel sounds are normal. She exhibits no distension. There is no tenderness. There is no rebound and no guarding.  Musculoskeletal: She exhibits no tenderness.  Moves x 4 extremities limited ROM to lower back due to pain. Bilateral lower extremities 1-2+ edema.   Lymphadenopathy:    She has no cervical adenopathy.  Neurological: She is oriented to person, place, and time. Coordination normal.  Skin: Skin is warm and dry. No rash noted. No erythema. No pallor.  Left mandibular previous abscess site scab noted without any tenderness,redness, swelling or drainage.   Psychiatric: She has a normal mood and affect.   Labs reviewed:  Recent Labs  03/28/16 07/11/16 01/16/17  NA 141 142 141  K 4.1 4.0 4.1  BUN 52* 49* 27*  CREATININE 1.2* 1.3* 1.2*    Recent Labs  07/11/16 01/16/17  AST 21 25  ALT 10 15  ALKPHOS 36 43    Recent Labs  03/18/16 07/11/16 12/16/16  WBC 9.1 10.5 8.4  HGB 12.2 13.6 12.4  HCT Yolanda 43 Yolanda  PLT 248 239 255   Lab Results  Component Value Date   TSH 1.84 01/16/2017   Lab Results  Component Value Date   HGBA1C 6.1 01/16/2017   Lab Results  Component Value Date   CHOL 175 01/16/2017   HDL 83 (A) 01/16/2017   LDLCALC 75 01/16/2017   TRIG 86 01/16/2017   Significant Diagnostic Results in last 30 days:  No results found.  Assessment/Plan 1. 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   2. Chronic diastolic congestive heart failure Has 1-2 + edema to lower extremities.Negative for cough,shortness of breath, wheezing or rales.continue on Furosemide 20 mg Tablet daily.monitor weight.     3. Uncontrolled type 2 diabetes mellitus with stage 3 chronic kidney disease, without long-term current use of insulin CBG readings in the 80's-100's.Recent Hgb A1C 6.1(01/16/2017).Decrease metformin 500 mg tablet to 250 mg ( 1/2Tablet) daily with breakfast.will discontinue metformin if CBG's remains < 200  then recheck Hgb A1C in 3 months. 4. Hyperlipidemia  Recent LDL at goal 83 (01/16/2017). Decrease simvastatin 10 mg Tablet to 1/2 tablet ( 5 mg Tablet). Recheck lipid panel in 3 months.  5. Chronic Kidney disease  Recent CR 1.17 ( 01/16/2017) at baseline. Continue to avoid nephrotoxins and dose all other medications for renal clearance.   Family/ staff Communication: Reviewed plan of care with patient and facility Nurse supervisor  Labs/tests ordered: Hgb A1C and lipid panel in 3 months.  Sandrea Hughs, NP

## 2017-01-20 ENCOUNTER — Encounter (HOSPITAL_BASED_OUTPATIENT_CLINIC_OR_DEPARTMENT_OTHER): Payer: Medicare Other | Attending: Internal Medicine

## 2017-01-20 DIAGNOSIS — L89223 Pressure ulcer of left hip, stage 3: Secondary | ICD-10-CM | POA: Diagnosis not present

## 2017-01-20 DIAGNOSIS — I10 Essential (primary) hypertension: Secondary | ICD-10-CM | POA: Diagnosis not present

## 2017-01-20 DIAGNOSIS — S71002A Unspecified open wound, left hip, initial encounter: Secondary | ICD-10-CM | POA: Diagnosis not present

## 2017-01-20 DIAGNOSIS — Z86718 Personal history of other venous thrombosis and embolism: Secondary | ICD-10-CM | POA: Insufficient documentation

## 2017-01-20 DIAGNOSIS — E119 Type 2 diabetes mellitus without complications: Secondary | ICD-10-CM | POA: Diagnosis not present

## 2017-01-21 DIAGNOSIS — M25552 Pain in left hip: Secondary | ICD-10-CM | POA: Diagnosis not present

## 2017-02-03 ENCOUNTER — Encounter: Payer: Self-pay | Admitting: Internal Medicine

## 2017-02-03 ENCOUNTER — Non-Acute Institutional Stay: Payer: Medicare Other | Admitting: Internal Medicine

## 2017-02-03 DIAGNOSIS — R600 Localized edema: Secondary | ICD-10-CM

## 2017-02-03 DIAGNOSIS — I739 Peripheral vascular disease, unspecified: Secondary | ICD-10-CM | POA: Diagnosis not present

## 2017-02-03 DIAGNOSIS — L89222 Pressure ulcer of left hip, stage 2: Secondary | ICD-10-CM

## 2017-02-03 DIAGNOSIS — E119 Type 2 diabetes mellitus without complications: Secondary | ICD-10-CM | POA: Diagnosis not present

## 2017-02-03 DIAGNOSIS — L89223 Pressure ulcer of left hip, stage 3: Secondary | ICD-10-CM | POA: Diagnosis not present

## 2017-02-03 DIAGNOSIS — Z86718 Personal history of other venous thrombosis and embolism: Secondary | ICD-10-CM | POA: Diagnosis not present

## 2017-02-03 DIAGNOSIS — I10 Essential (primary) hypertension: Secondary | ICD-10-CM | POA: Diagnosis not present

## 2017-02-03 NOTE — Progress Notes (Signed)
Location:  [facility] Room Number: 23 Place of Service:  ALF [phone]) Provider:  Blanchie Serve, MD  Blanchie Serve, MD  Patient Care Team: Blanchie Serve, MD as PCP - General (Internal Medicine) Rutherford Guys, MD as Consulting Physician (Ophthalmology) Ngetich, Nelda Bucks, NP as Nurse Practitioner [facility] Medicine)  Extended Emergency Contact Information Primary Emergency Contact: Karn,Leonard Address: [address] [place] [address] Johnnette Litter of [place] Phone: [phone] Work Phone: [phone] Mobile Phone: [phone] Relation: Son Secondary Emergency Contact: [place] of [place] Phone: [phone] Relation: Friend  Code Status:  DNR Goals of care: Advanced Directive information Advanced Directives 02/03/2017  Does Patient Have a Medical Advance Directive? Yes  Type of Paramedic of [place];Living will;Out of facility DNR (pink MOST or yellow form)  Does patient want to make changes to medical advance directive? No - Patient declined  Copy of [place] in Chart? Yes  Would patient like information on creating a medical advance directive? -  Pre-existing out of facility DNR order (yellow form or pink MOST form) Yellow form placed in chart (order not valid for inpatient use);Pink MOST form placed in chart (order not valid for inpatient use)     Chief Complaint  Patient presents with  . Acute Visit    Increased leg edema and pain    HPI:  Pt is a 83 y.o. female seen today for an acute visit for increased swelling over last 1 week to her legs left > right with pain. Denies any fall or injury. 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 She complaints of ongoing pressure ulcer to left hip and advise from wound clinic for her to sleep on right side. She has tried this but has nerve impingement to her shoulder causing pain with  numbness and tingling to right arm after being in that position for few hours and has to turn to left again. This overall has been a frustrating experience for her.    Past Medical History:  Diagnosis Date  . Acute blood loss anemia   . Allergy   . Asthma   . AVM (arteriovenous malformation) of colon with hemorrhage   . Bursitis of right hip   . Cataract   . CKD (chronic kidney disease), stage III 11/04/2015  . COLONIC POLYPS 03/26/2005   Qualifier: Diagnosis of  By: Talbert Cage CMA ([place]), June    . DIVERTICULOSIS, COLON 03/26/2005   Qualifier: Diagnosis of  By: Talbert Cage CMA ([place]), June    . DVT (deep venous thrombosis), left 11/03/2015  . Ecchymosis 12/16/2015  . Hyperglycemia 12/16/2015  . Hyperlipidemia   . Hypertension   . Multiple skin tears 12/16/2015  . Osteopenia   . Stasis edema of both lower extremities 11/04/2015  . Unstable gait 12/16/2015  . Urine incontinence 12/16/2015   Past Surgical History:  Procedure Laterality Date  . ABDOMINAL HYSTERECTOMY  2006   Dr. Maryelizabeth Rowan  . CATARACT EXTRACTION W/ INTRAOCULAR LENS IMPLANT Bilateral 1999/2003   Dr. Rutherford Guys  . COLONOSCOPY N/A 11/06/2015   Procedure: COLONOSCOPY;  Surgeon: Mauri Pole, MD;  Location: [facility];  Service: Endoscopy;  Laterality: N/A;  MAC if available, otherwise moderate sedation    Allergies  Allergen Reactions  . Epinephrine Nausea Only    Unknown.  Marland Kitchen Hydrocodone-Acetaminophen     REACTION: nausea  . Labetalol   .  Procaine     Outpatient Encounter Prescriptions as of 02/03/2017  Medication Sig  . acetaminophen (TYLENOL) 325 MG tablet Take 650 mg by mouth every 4 (four) hours as needed.  Marland Kitchen acetaminophen (TYLENOL) 500 MG tablet Take 500 mg by mouth 2 (two) times daily.   . calcium carbonate (TUMS - DOSED IN MG ELEMENTAL CALCIUM) 500 MG chewable tablet Chew 2 tablets by mouth daily.   . furosemide (LASIX) 20 MG tablet Take 1 tablet (20 mg total) by mouth daily.  Marland Kitchen gabapentin (NEURONTIN) 100 MG  capsule Take 300 mg by mouth 2 (two) times daily.   Marland Kitchen HYDROcodone-acetaminophen (NORCO/VICODIN) 5-325 MG tablet Take 0.5 tablets by mouth 3 (three) times daily. 1/2 tablet three times daily for pain  . Lidocaine 4 % PTCH Apply 1 application topically every 12 (twelve) hours. Right hip  . loperamide (IMODIUM A-D) 2 MG tablet Take 2 mg by mouth as needed for diarrhea or loose stools.  . metFORMIN (GLUCOPHAGE) 500 MG tablet Take 0.5 tablets (250 mg total) by mouth daily with breakfast.  . Multiple Vitamins-Minerals (ICAPS MV) TABS Take 1 tablet by mouth daily.  . Multiple Vitamins-Minerals (MULTIVITAMIN WITH MINERALS) tablet Take 1 tablet by mouth daily.   . simvastatin (ZOCOR) 10 MG tablet Take 0.5 tablets (5 mg total) by mouth daily.   No facility-administered encounter medications on file as of 02/03/2017.     Review of Systems  Constitutional: Negative for chills and fever.  Eyes: Negative for visual disturbance.  Respiratory: Negative for shortness of breath.   Cardiovascular: Positive for leg swelling. Negative for chest pain and palpitations.  Gastrointestinal: Negative for abdominal pain.  Musculoskeletal: Positive for arthralgias and gait problem.  Skin: Positive for wound.  Neurological: Negative for syncope, weakness and light-headedness.  Hematological: Bruises/bleeds easily.    Immunization History  Administered Date(s) Administered  . DTaP 05/13/2005  . Influenza-Unspecified 02/11/2015, 02/22/2016  . PPD Test 12/08/2015, 12/22/2015  . Pneumococcal Conjugate-13 05/13/2014  . Pneumococcal Polysaccharide-23 05/13/2014  . Td 12/11/2005  . Zoster 05/13/2014   Pertinent  Health Maintenance Due  Topic Date Due  . FOOT EXAM  09/12/1935  . OPHTHALMOLOGY EXAM  09/12/1935  . URINE MICROALBUMIN  09/12/1935  . INFLUENZA VACCINE  02/10/2017 (Originally 12/11/2016)  . PNA vac Low Risk Adult (2 of 2 - PPSV23) 03/13/2017 (Originally 05/14/2015)  . HEMOGLOBIN A1C  07/16/2017  . DEXA  SCAN  Completed   Fall Risk  12/30/2016 11/03/2015  Falls in the past year? No No   Functional Status Survey:    Vitals:   02/03/17 1007  BP: 134/76  Pulse: 70  Resp: 16  Temp: 98 F (36.7 C)  TempSrc: Oral  SpO2: 97%  Weight: 154 lb 12.8 oz (70.2 kg)  Height: 5' 3"  (1.6 m)   Body mass index is 27.42 kg/m. Physical Exam  Constitutional: She is oriented to person, place, and time. She appears well-developed and well-nourished. No distress.  HENT:  Head: Normocephalic and atraumatic.  Mouth/Throat: Oropharynx is clear and moist.  Eyes: Pupils are equal, round, and reactive to light.  Neck: Neck supple.  Cardiovascular: Normal rate and regular rhythm.   Pulmonary/Chest: Effort normal and breath sounds normal. No respiratory distress. She has no wheezes. She has no rales.  Abdominal: Soft. Bowel sounds are normal.  Musculoskeletal: She exhibits edema.  Right leg 1+ and left leg 2+ edema, chronic skin changes to both legs, normal temperature, homan's sign +, good dorsalis pedis  Lymphadenopathy:  She has no cervical adenopathy.  Neurological: She is alert and oriented to person, place, and time.  Skin: Skin is warm and dry. She is not diaphoretic.  Stage 2 pressure ulcer to left hip, no signs of infection  Psychiatric: She has a normal mood and affect. Her behavior is normal.    Labs reviewed:  Recent Labs  03/28/16 07/11/16 01/16/17  NA 141 142 141  K 4.1 4.0 4.1  BUN 52* 49* 27*  CREATININE 1.2* 1.3* 1.2*    Recent Labs  07/11/16 01/16/17  AST 21 25  ALT 10 15  ALKPHOS 36 43    Recent Labs  03/18/16 07/11/16 12/16/16  WBC 9.1 10.5 8.4  HGB 12.2 13.6 12.4  HCT 39 43 39  PLT 248 239 255   Lab Results  Component Value Date   TSH 1.84 01/16/2017   Lab Results  Component Value Date   HGBA1C 6.1 01/16/2017   Lab Results  Component Value Date   CHOL 175 01/16/2017   HDL 83 (A) 01/16/2017   LDLCALC 75 01/16/2017   TRIG 86 01/16/2017     Significant Diagnostic Results in last 30 days:  No results found.  Assessment/Plan  Left leg edema Greater than right leg. With her homan's sign + and history of DVT in past, to obtain venous doppler study. Test resulted. Preliminary result is negative for DVT per nursing. Awaiting full report.   PVD Has chronic stasis changes to skin of her legs. No open wound to legs. To keep legs elevated at rest. Change lasix to 20 mg bid x 5 days, then daily. Add kcl 20 meq daily x 5 days.  Once dvt is ruled out, consider ted stockings.   Stage 2 pressure ulcer To left hip. Frequent re-positioning mainly to right side is a challenge for her with her right shoulder nerve impingement. She will benefit from gel overlay mattress to help with pressure redistribution and prevent further pressure ulcer from developing and will also promote this wound to heal. Continue MVI with minerals.    Family/ staff Communication: reviewed care plan with patient and charge nurse.    Labs/tests ordered:  BMP next lab  [facility], MD Internal Medicine [place], [place] [address] Cell Phone (Monday-Friday 8 am - 5 pm): [phone] On Call: [phone] and follow prompts after 5 pm and on weekends Office Phone: [phone] Office Fax: [phone]

## 2017-02-04 DIAGNOSIS — L0201 Cutaneous abscess of face: Secondary | ICD-10-CM | POA: Diagnosis not present

## 2017-02-04 DIAGNOSIS — M7989 Other specified soft tissue disorders: Secondary | ICD-10-CM | POA: Diagnosis not present

## 2017-02-06 DIAGNOSIS — I10 Essential (primary) hypertension: Secondary | ICD-10-CM | POA: Diagnosis not present

## 2017-02-06 LAB — BASIC METABOLIC PANEL
BUN: 27 — AB (ref 4–21)
CHLORIDE: 104
CREATININE: 1.19
Calcium: 9
Carbon Dioxide, Total: 29
Glucose: 95
Potassium: 4
SODIUM: 142

## 2017-02-07 ENCOUNTER — Encounter: Payer: Self-pay | Admitting: *Deleted

## 2017-02-13 ENCOUNTER — Other Ambulatory Visit: Payer: Self-pay

## 2017-02-13 MED ORDER — HYDROCODONE-ACETAMINOPHEN 5-325 MG PO TABS: 0.5000 | ORAL_TABLET | Freq: Three times a day (TID) | ORAL | 0 refills | Status: DC

## 2017-02-17 ENCOUNTER — Encounter (HOSPITAL_BASED_OUTPATIENT_CLINIC_OR_DEPARTMENT_OTHER): Payer: Medicare Other | Attending: Internal Medicine

## 2017-02-17 DIAGNOSIS — Z86718 Personal history of other venous thrombosis and embolism: Secondary | ICD-10-CM | POA: Diagnosis not present

## 2017-02-17 DIAGNOSIS — L89223 Pressure ulcer of left hip, stage 3: Secondary | ICD-10-CM | POA: Diagnosis not present

## 2017-02-17 DIAGNOSIS — E119 Type 2 diabetes mellitus without complications: Secondary | ICD-10-CM | POA: Insufficient documentation

## 2017-02-17 DIAGNOSIS — I10 Essential (primary) hypertension: Secondary | ICD-10-CM | POA: Diagnosis not present

## 2017-02-21 ENCOUNTER — Non-Acute Institutional Stay: Payer: Medicare Other | Admitting: Family

## 2017-02-21 DIAGNOSIS — M5416 Radiculopathy, lumbar region: Secondary | ICD-10-CM | POA: Diagnosis not present

## 2017-02-21 DIAGNOSIS — G8929 Other chronic pain: Secondary | ICD-10-CM | POA: Diagnosis not present

## 2017-02-21 DIAGNOSIS — M25551 Pain in right hip: Secondary | ICD-10-CM | POA: Diagnosis not present

## 2017-02-21 DIAGNOSIS — M25511 Pain in right shoulder: Secondary | ICD-10-CM

## 2017-02-21 MED ORDER — HYDROCODONE-ACETAMINOPHEN 5-325 MG PO TABS: 1.0000 | ORAL_TABLET | Freq: Three times a day (TID) | ORAL | 0 refills | Status: DC

## 2017-02-21 NOTE — Progress Notes (Signed)
Location:  [facility] Room Number: 23 Place of Service:  ALF [phone]) Provider: Terin Cragle FNP-C  Blanchie Serve, MD  Patient Care Team: Blanchie Serve, MD as PCP - General (Internal Medicine) Rutherford Guys, MD as Consulting Physician (Ophthalmology) Issabella Rix, Nelda Bucks, NP as Nurse Practitioner [facility] Medicine)  Extended Emergency Contact Information Primary Emergency Contact: Richner,Leonard Address: [address] [place] [address] Johnnette Litter of [place] Phone: [phone] Work Phone: [phone] Mobile Phone: [phone] Relation: Son Secondary Emergency Contact: [place] of [place] Phone: [phone] Relation: Friend  Code Status:  DNR Goals of care: Advanced Directive information Advanced Directives 02/03/2017  Does Patient Have a Medical Advance Directive? Yes  Type of Paramedic of [place];Living will;Out of facility DNR (pink MOST or yellow form)  Does patient want to make changes to medical advance directive? No - Patient declined  Copy of [place] in Chart? Yes  Would patient like information on creating a medical advance directive? -  Pre-existing out of facility DNR order (yellow form or pink MOST form) Yellow form placed in chart (order not valid for inpatient use);Pink MOST form placed in chart (order not valid for inpatient use)     Chief Complaint  Patient presents with  . Acute Visit    right hip and right shoulder pain     HPI:  Pt is a 83 y.o. female seen today at [facility] for an acute visit for evaluation of right hip and right shoulder pain. She is seen in her room today per facility Nurse request. Nurse reports patient complains of worst pain during the night. Patient states whenever she lies on the right side pain on shoulder worst and the whole hand tingles. She states recent cortisone injection given by special did not  help with her back pain. She would like to explore other options for pain management with specialist.she states pain improves as the day progresses after taking her pain medication.     Past Medical History:  Diagnosis Date  . Acute blood loss anemia   . Allergy   . Asthma   . AVM (arteriovenous malformation) of colon with hemorrhage   . Bursitis of right hip   . Cataract   . CKD (chronic kidney disease), stage III 11/04/2015  . COLONIC POLYPS 03/26/2005   Qualifier: Diagnosis of  By: Talbert Cage CMA ([place]), June    . DIVERTICULOSIS, COLON 03/26/2005   Qualifier: Diagnosis of  By: Talbert Cage CMA ([place]), June    . DVT (deep venous thrombosis), left 11/03/2015  . Ecchymosis 12/16/2015  . Hyperglycemia 12/16/2015  . Hyperlipidemia   . Hypertension   . Multiple skin tears 12/16/2015  . Osteopenia   . Stasis edema of both lower extremities 11/04/2015  . Unstable gait 12/16/2015  . Urine incontinence 12/16/2015   Past Surgical History:  Procedure Laterality Date  . ABDOMINAL HYSTERECTOMY  2006   Dr. Maryelizabeth Rowan  . CATARACT EXTRACTION W/ INTRAOCULAR LENS IMPLANT Bilateral 1999/2003   Dr. Rutherford Guys  . COLONOSCOPY N/A 11/06/2015   Procedure: COLONOSCOPY;  Surgeon: Mauri Pole, MD;  Location: [facility];  Service: Endoscopy;  Laterality: N/A;  MAC if available, otherwise moderate sedation    Allergies  Allergen Reactions  . Epinephrine Nausea Only    Unknown.  Marland Kitchen Hydrocodone-Acetaminophen     REACTION: nausea  . Labetalol   . Procaine  Outpatient Encounter Prescriptions as of 02/21/2017  Medication Sig  . acetaminophen (TYLENOL) 325 MG tablet Take 650 mg by mouth every 4 (four) hours as needed.  Marland Kitchen acetaminophen (TYLENOL) 500 MG tablet Take 500 mg by mouth 2 (two) times daily.   . calcium carbonate (TUMS - DOSED IN MG ELEMENTAL CALCIUM) 500 MG chewable tablet Chew 2 tablets by mouth daily.   . furosemide (LASIX) 20 MG tablet Take 1 tablet (20 mg total) by mouth daily.  Marland Kitchen gabapentin  (NEURONTIN) 100 MG capsule Take 300 mg by mouth 2 (two) times daily.   Marland Kitchen HYDROcodone-acetaminophen (NORCO/VICODIN) 5-325 MG tablet Take 1 tablet by mouth 3 (three) times daily. 1/2 tablet three times daily for pain  . Lidocaine 4 % PTCH Apply 1 application topically every 12 (twelve) hours. Right hip  . loperamide (IMODIUM A-D) 2 MG tablet Take 2 mg by mouth as needed for diarrhea or loose stools.  . metFORMIN (GLUCOPHAGE) 500 MG tablet Take 0.5 tablets (250 mg total) by mouth daily with breakfast.  . Multiple Vitamins-Minerals (ICAPS MV) TABS Take 1 tablet by mouth daily.  . Multiple Vitamins-Minerals (MULTIVITAMIN WITH MINERALS) tablet Take 1 tablet by mouth daily.   . simvastatin (ZOCOR) 10 MG tablet Take 0.5 tablets (5 mg total) by mouth daily.  . [DISCONTINUED] HYDROcodone-acetaminophen (NORCO/VICODIN) 5-325 MG tablet Take 0.5 tablets by mouth 3 (three) times daily. 1/2 tablet three times daily for pain   No facility-administered encounter medications on file as of 02/21/2017.     Review of Systems  Constitutional: Negative for activity change, appetite change, chills, fatigue and fever.  Respiratory: Negative for cough, chest tightness, shortness of breath and wheezing.   Cardiovascular: Positive for leg swelling. Negative for chest pain and palpitations.  Gastrointestinal: Negative for abdominal distention, abdominal pain, constipation, diarrhea, nausea and vomiting.  Genitourinary: Negative for dysuria, flank pain, frequency and urgency.  Musculoskeletal: Positive for arthralgias and back pain.  Skin: Negative for color change, pallor and rash.       Left ischium ulcer managed by wound center   Neurological: Negative for dizziness, seizures, syncope and headaches.       Numbness and tingling sensation on right arm/hand  during the night when lying on right side.    Psychiatric/Behavioral: Negative for agitation, confusion and sleep disturbance. The patient is not nervous/anxious.      Immunization History  Administered Date(s) Administered  . DTaP 05/13/2005  . Influenza-Unspecified 02/11/2015, 02/22/2016  . PPD Test 12/08/2015, 12/22/2015  . Pneumococcal Conjugate-13 05/13/2014  . Pneumococcal Polysaccharide-23 05/13/2014  . Td 12/11/2005  . Zoster 05/13/2014   Pertinent  Health Maintenance Due  Topic Date Due  . FOOT EXAM  09/12/1935  . OPHTHALMOLOGY EXAM  09/12/1935  . URINE MICROALBUMIN  09/12/1935  . INFLUENZA VACCINE  12/11/2016  . PNA vac Low Risk Adult (2 of 2 - PPSV23) 03/13/2017 (Originally 05/14/2015)  . HEMOGLOBIN A1C  07/16/2017  . DEXA SCAN  Completed   Fall Risk  12/30/2016 11/03/2015  Falls in the past year? No No    Vitals:   02/21/17 1045  BP: 126/62  Pulse: 76  Resp: 18  Temp: 97.8 F (36.6 C)  SpO2: 96%  Weight: 150 lb 3.2 oz (68.1 kg)  Height: _0  (1.6 m)   Body mass index is 26.61 kg/m. Physical Exam  Constitutional: She is oriented to person, place, and time. She appears well-developed and well-nourished.  Elderly in no acute distress   HENT:  Head: Normocephalic.  Mouth/Throat: Oropharynx is clear and moist. No oropharyngeal exudate.  Eyes: Pupils are equal, round, and reactive to light. Conjunctivae and EOM are normal. Right eye exhibits no discharge. Left eye exhibits no discharge. No scleral icterus.  Neck: Normal range of motion. No JVD present. No thyromegaly present.  Cardiovascular: Normal rate, regular rhythm, normal heart sounds and intact distal pulses.  Exam reveals no gallop and no friction rub.   No murmur heard. Pulmonary/Chest: Effort normal and breath sounds normal. No respiratory distress. She has no wheezes. She has no rales.  Abdominal: Soft. Bowel sounds are normal. She exhibits no distension. There is no tenderness. There is no rebound and no guarding.  Musculoskeletal:  Unsteady gait uses walker. Limited ROM to right shoulder and right hip secondary to chronic pain. Bilateral lower extremities 1+  edema.   Lymphadenopathy:    She has no cervical adenopathy.  Neurological: She is oriented to person, place, and time. Coordination normal.  Skin: Skin is warm and dry. No rash noted. No erythema. No pallor.  Left hip ischium ulcer site with small open area surrounding skin progressive healing and without any signs or symptoms of infection.   Psychiatric: She has a normal mood and affect.   Labs reviewed:  Recent Labs  07/11/16 01/16/17 02/06/17  NA 142 141 142  K 4.0 4.1 4.0  CL  --   --  104  CO2  --   --  29  BUN 49* 27* 27*  CREATININE 1.3* 1.2* 1.19  CALCIUM  --   --  9.0    Recent Labs  07/11/16 01/16/17  AST 21 25  ALT 10 15  ALKPHOS 36 43    Recent Labs  03/18/16 07/11/16 12/16/16  WBC 9.1 10.5 8.4  HGB 12.2 13.6 12.4  HCT 39 43 39  PLT 248 239 255   Lab Results  Component Value Date   TSH 1.84 01/16/2017   Lab Results  Component Value Date   HGBA1C 6.1 01/16/2017   Lab Results  Component Value Date   CHOL 175 01/16/2017   HDL 83 (A) 01/16/2017   LDLCALC 75 01/16/2017   TRIG 86 01/16/2017    Significant Diagnostic Results in last 30 days:  No results found.  Assessment/Plan 1. Right hip pain Chronic pain radiating from the back.Status post cortisone injection without any effect.Increase Norco 5 -325 mg Tablet to one by mouth every 8 hours. Follow up with Orthopedic to explore other options for pain management with specialist per patient's request.  2. Chronic right shoulder pain Numbness and tingling sensation of arm/fingers reported with lying on the right side. Patient lying more on the right side to improve left ischium ulcer healing process. Encourage to turn during the night. Right ischium ulcer now healing well.continue on Gabapentin 300 mg capsule twice daily. Norco as above.    3. Chronic radicular pain of lower back Change Norco as above. Refer to orthopedic for re-evaluation. Patient reports recent cortisone injection ineffective.  Continue to monitor.   Family/ staff Communication: Reviewed plan of care with patient and facility Nurse supervisor  Labs/tests ordered: None   Sandrea Hughs, NP

## 2017-03-03 DIAGNOSIS — I10 Essential (primary) hypertension: Secondary | ICD-10-CM | POA: Diagnosis not present

## 2017-03-03 DIAGNOSIS — L89223 Pressure ulcer of left hip, stage 3: Secondary | ICD-10-CM | POA: Diagnosis not present

## 2017-03-03 DIAGNOSIS — Z86718 Personal history of other venous thrombosis and embolism: Secondary | ICD-10-CM | POA: Diagnosis not present

## 2017-03-03 DIAGNOSIS — E119 Type 2 diabetes mellitus without complications: Secondary | ICD-10-CM | POA: Diagnosis not present

## 2017-03-11 ENCOUNTER — Encounter: Payer: Self-pay | Admitting: Family

## 2017-03-11 ENCOUNTER — Non-Acute Institutional Stay: Payer: Medicare Other | Admitting: Family

## 2017-03-11 DIAGNOSIS — L089 Local infection of the skin and subcutaneous tissue, unspecified: Secondary | ICD-10-CM | POA: Diagnosis not present

## 2017-03-11 DIAGNOSIS — E782 Mixed hyperlipidemia: Secondary | ICD-10-CM

## 2017-03-11 DIAGNOSIS — E1122 Type 2 diabetes mellitus with diabetic chronic kidney disease: Secondary | ICD-10-CM

## 2017-03-11 NOTE — Progress Notes (Addendum)
Location:  [place] Room Number: 23 Place of Service:  ALF [phone]) Provider: Dinah Ngetich FNP-C  Blanchie Serve, MD  Patient Care Team: Blanchie Serve, MD as PCP - General (Internal Medicine) Rutherford Guys, MD as Consulting Physician (Ophthalmology) Ngetich, Nelda Bucks, NP as Nurse Practitioner [facility])  Extended Emergency Contact Information Primary Emergency Contact: Ducat,Leonard Address: [address] [place] [address] Johnnette Litter of [place] Phone: [phone] Work Phone: [phone] Mobile Phone: [phone] Relation: Son Secondary Emergency Contact: T[place] of [place] Phone: [phone] Relation: Friend  Code Status:  DNR Goals of care: Advanced Directive information Advanced Directives 03/11/2017  Does Patient Have a Medical Advance Directive? Yes  Type of Advance Directive Out of facility DNR (pink MOST or yellow form);Living will;Healthcare Power of Attorney  Does patient want to make changes to medical advance directive? -  Copy of [place] in Chart? Yes  Would patient like information on creating a medical advance directive? -  Pre-existing out of facility DNR order (yellow form or pink MOST form) Yellow form placed in chart (order not valid for inpatient use);Pink MOST form placed in chart (order not valid for inpatient use)     Chief Complaint  Patient presents with  . Acute Visit    check elbow-red and swollen    HPI:  Pt is a 83 y.o. female seen today at [facility] for an acute visit for evaluation of left elbow red swollen area.She is seen in her room today per facility Nurse request. Nurse reports patient has a red, swollen raised area on her left elbow.Patient states left elbow area tender to touch.She denies any injury to area, fever or chills.    Addendum 03/14/2017: Patient's lab results Hgb A1C 5.7 (03/14/2017).Weekly CBG readings  ranging in the 80's-90's. Currently on Metformin 250 mg tablet daily. No signs or symptoms of hypoglycemia.Recent lipid panel results within target goal LDL 65 (03/14/2017) on simvastatin 5 mg tablet daily.      Past Medical History:  Diagnosis Date  . Acute blood loss anemia   . Allergy   . Asthma   . AVM (arteriovenous malformation) of colon with hemorrhage   . Bursitis of right hip   . Cataract   . CKD (chronic kidney disease), stage III ([place]) 11/04/2015  . COLONIC POLYPS 03/26/2005   Qualifier: Diagnosis of  By: Talbert Cage CMA ([place]), June    . DIVERTICULOSIS, COLON 03/26/2005   Qualifier: Diagnosis of  By: Talbert Cage CMA ([place]), June    . DVT (deep venous thrombosis), left 11/03/2015  . Ecchymosis 12/16/2015  . Hyperglycemia 12/16/2015  . Hyperlipidemia   . Hypertension   . Multiple skin tears 12/16/2015  . Osteopenia   . Stasis edema of both lower extremities 11/04/2015  . Unstable gait 12/16/2015  . Urine incontinence 12/16/2015   Past Surgical History:  Procedure Laterality Date  . ABDOMINAL HYSTERECTOMY  2006   Dr. Maryelizabeth Rowan  . CATARACT EXTRACTION W/ INTRAOCULAR LENS IMPLANT Bilateral 1999/2003   Dr. Rutherford Guys  . COLONOSCOPY N/A 11/06/2015   Procedure: COLONOSCOPY;  Surgeon: Mauri Pole, MD;  Location: [facility];  Service: Endoscopy;  Laterality: N/A;  MAC if available, otherwise moderate sedation    Allergies  Allergen Reactions  . Epinephrine Nausea Only    Unknown.  Marland Kitchen Hydrocodone-Acetaminophen     REACTION: nausea  . Labetalol   . Procaine  Outpatient Encounter Prescriptions as of 03/11/2017  Medication Sig  . acetaminophen (TYLENOL) 325 MG tablet Take 650 mg by mouth every 4 (four) hours as needed.  Marland Kitchen acetaminophen (TYLENOL) 500 MG tablet Take 500 mg by mouth 2 (two) times daily.   . calcium carbonate (TUMS - DOSED IN MG ELEMENTAL CALCIUM) 500 MG chewable tablet Chew 2 tablets by mouth daily.   . furosemide (LASIX) 20 MG tablet Take 1 tablet (20 mg total)  by mouth daily.  Marland Kitchen gabapentin (NEURONTIN) 100 MG capsule Take 300 mg by mouth 2 (two) times daily.   Marland Kitchen HYDROcodone-acetaminophen (NORCO/VICODIN) 5-325 MG tablet Take 1 tablet by mouth 3 (three) times daily.  . Lidocaine 4 % PTCH Apply 1 application topically every 12 (twelve) hours. Right hip  . loperamide (IMODIUM A-D) 2 MG tablet Take 2 mg by mouth as needed for diarrhea or loose stools.  . metFORMIN (GLUCOPHAGE) 500 MG tablet Take 0.5 tablets (250 mg total) by mouth daily with breakfast.  . Multiple Vitamins-Minerals (ICAPS MV) TABS Take 1 tablet by mouth daily.  . Multiple Vitamins-Minerals (MULTIVITAMIN WITH MINERALS) tablet Take 1 tablet by mouth daily.   . simvastatin (ZOCOR) 10 MG tablet Take 0.5 tablets (5 mg total) by mouth daily.  . [DISCONTINUED] HYDROcodone-acetaminophen (NORCO/VICODIN) 5-325 MG tablet Take 1 tablet by mouth 3 (three) times daily. 1/2 tablet three times daily for pain (Patient taking differently: Take 1 tablet by mouth 3 (three) times daily. )   No facility-administered encounter medications on file as of 03/11/2017.     Review of Systems  Constitutional: Negative for activity change, chills, fatigue and fever.  Respiratory: Negative for cough, chest tightness, shortness of breath and wheezing.   Cardiovascular: Negative for chest pain, palpitations and leg swelling.  Gastrointestinal: Negative for abdominal distention, abdominal pain, constipation, diarrhea, nausea and vomiting.  Musculoskeletal: Positive for arthralgias and gait problem. Negative for myalgias.  Skin: Negative for color change, pallor and rash.       Left elbow swollen and redness   Neurological: Negative for dizziness, light-headedness and headaches.  Psychiatric/Behavioral: Negative for agitation and confusion. The patient is not nervous/anxious.     Immunization History  Administered Date(s) Administered  . DTaP 05/13/2005  . Influenza-Unspecified 02/11/2015, 02/22/2016, 02/19/2017  .  PPD Test 12/08/2015, 12/22/2015  . Pneumococcal Conjugate-13 05/13/2014  . Pneumococcal Polysaccharide-23 05/13/2014  . Td 12/11/2005  . Zoster 05/13/2014   Pertinent  Health Maintenance Due  Topic Date Due  . FOOT EXAM  09/12/1935  . OPHTHALMOLOGY EXAM  09/12/1935  . URINE MICROALBUMIN  09/12/1935  . PNA vac Low Risk Adult (2 of 2 - PPSV23) 03/13/2017 (Originally 05/14/2015)  . HEMOGLOBIN A1C  07/16/2017  . INFLUENZA VACCINE  Completed  . DEXA SCAN  Completed   Fall Risk  12/30/2016 11/03/2015  Falls in the past year? No No    Vitals:   03/11/17 1209  BP: (!) 118/57  Pulse: 80  Resp: 18  Temp: 98.5 F (36.9 C)  SpO2: 97%  Weight: 155 lb 12.8 oz (70.7 kg)  Height: _0  (1.6 m)   Body mass index is 27.6 kg/m. Physical Exam  Constitutional: She is oriented to person, place, and time. She appears well-developed and well-nourished.  Elderly in no acute distress   HENT:  Head: Normocephalic.  Mouth/Throat: Oropharynx is clear and moist. No oropharyngeal exudate.  Eyes: Pupils are equal, round, and reactive to light. Conjunctivae and EOM are normal. Right eye exhibits no discharge. Left eye exhibits  no discharge. No scleral icterus.  Neck: Normal range of motion. No JVD present. No thyromegaly present.  Cardiovascular: Normal rate, regular rhythm, normal heart sounds and intact distal pulses.  Exam reveals no gallop and no friction rub.   No murmur heard. Pulmonary/Chest: Effort normal and breath sounds normal. No respiratory distress. She has no wheezes. She has no rales.  Abdominal: Soft. Bowel sounds are normal. She exhibits no distension. There is no tenderness. There is no rebound and no guarding.  Musculoskeletal: She exhibits no tenderness.  Moves x 4 extremities. Unsteady gait uses walker.   Lymphadenopathy:    She has no cervical adenopathy.  Neurological: She is oriented to person, place, and time. Coordination normal.  Skin: Skin is warm and dry. No rash noted. No  pallor.  Left elbow Quarter size raised skin lesion yellow in color surrounding skin tissues red and tender to touch.  Also noted adjust Pea size lesion without any signs of infections.   Psychiatric: She has a normal mood and affect.   Labs reviewed:  Recent Labs  07/11/16 01/16/17 02/06/17  NA 142 141 142  K 4.0 4.1 4.0  CL  --   --  104  CO2  --   --  29  BUN 49* 27* 27*  CREATININE 1.3* 1.2* 1.19  CALCIUM  --   --  9.0    Recent Labs  07/11/16 01/16/17  AST 21 25  ALT 10 15  ALKPHOS 36 43    Recent Labs  03/18/16 07/11/16 12/16/16  WBC 9.1 10.5 8.4  HGB 12.2 13.6 12.4  HCT 39 43 39  PLT 248 239 255   Lab Results  Component Value Date   TSH 1.84 01/16/2017   Lab Results  Component Value Date   HGBA1C 6.1 01/16/2017   Lab Results  Component Value Date   CHOL 175 01/16/2017   HDL 83 (A) 01/16/2017   LDLCALC 75 01/16/2017   TRIG 86 01/16/2017    Significant Diagnostic Results in last 30 days:  No results found.  Assessment/Plan  Skin lesion, infected Afebrile.Left elbow Quarter size raised skin lesion yellow in color surrounding  Redness  and tender to touch.start on doxycycline 100 mg tablet twice daily x 7 days.Give along with Florastor 250 mg Capsule twice daily X 10 days. Continue to monitor. Refer to dermatology for evaluation.Also noted adjust Pea size lesion without any signs of infections.continue to monitor.   Controlled Type 2DM Hgb A1C 5.7 ( 03/14/2017).CBG's in the 80's-90's.wil discontinue metformin 250 mg Tablet daily then recheck Hgb A1C in 3 months.  Hyperlipidemia LDL 65 (03/14/2017).Discontinue simvastatin 5 mg tablet. Recheck Fasting Lipid panel in 3 months.      Family/ staff Communication: Reviewed plan of care with patient and facility Nurse.  Labs/tests ordered: Hgb A1C and Fasting Lipid panel in 3 months.       Sandrea Hughs, NP

## 2017-03-13 DIAGNOSIS — I10 Essential (primary) hypertension: Secondary | ICD-10-CM | POA: Diagnosis not present

## 2017-03-13 DIAGNOSIS — E782 Mixed hyperlipidemia: Secondary | ICD-10-CM | POA: Diagnosis not present

## 2017-03-13 DIAGNOSIS — R739 Hyperglycemia, unspecified: Secondary | ICD-10-CM | POA: Diagnosis not present

## 2017-03-13 LAB — LIPID PANEL
CHOLESTEROL: 150 (ref 0–200)
HDL: 77 — AB (ref 35–70)
LDL CALC: 65
Triglycerides: 80 (ref 40–160)

## 2017-03-13 LAB — HEMOGLOBIN A1C: Hemoglobin A1C: 5.7

## 2017-03-14 ENCOUNTER — Encounter: Payer: Self-pay | Admitting: *Deleted

## 2017-03-17 ENCOUNTER — Encounter (HOSPITAL_BASED_OUTPATIENT_CLINIC_OR_DEPARTMENT_OTHER): Payer: Medicare Other | Attending: Internal Medicine

## 2017-03-17 DIAGNOSIS — I10 Essential (primary) hypertension: Secondary | ICD-10-CM | POA: Insufficient documentation

## 2017-03-17 DIAGNOSIS — L89223 Pressure ulcer of left hip, stage 3: Secondary | ICD-10-CM | POA: Insufficient documentation

## 2017-03-17 DIAGNOSIS — Z86718 Personal history of other venous thrombosis and embolism: Secondary | ICD-10-CM | POA: Diagnosis not present

## 2017-03-17 DIAGNOSIS — S51002A Unspecified open wound of left elbow, initial encounter: Secondary | ICD-10-CM | POA: Diagnosis not present

## 2017-03-17 DIAGNOSIS — L729 Follicular cyst of the skin and subcutaneous tissue, unspecified: Secondary | ICD-10-CM | POA: Insufficient documentation

## 2017-03-17 DIAGNOSIS — E119 Type 2 diabetes mellitus without complications: Secondary | ICD-10-CM | POA: Insufficient documentation

## 2017-03-18 DIAGNOSIS — M5136 Other intervertebral disc degeneration, lumbar region: Secondary | ICD-10-CM | POA: Diagnosis not present

## 2017-03-18 DIAGNOSIS — G894 Chronic pain syndrome: Secondary | ICD-10-CM | POA: Diagnosis not present

## 2017-03-26 DIAGNOSIS — D692 Other nonthrombocytopenic purpura: Secondary | ICD-10-CM | POA: Diagnosis not present

## 2017-03-26 DIAGNOSIS — L720 Epidermal cyst: Secondary | ICD-10-CM | POA: Diagnosis not present

## 2017-03-26 DIAGNOSIS — L814 Other melanin hyperpigmentation: Secondary | ICD-10-CM | POA: Diagnosis not present

## 2017-03-31 DIAGNOSIS — I10 Essential (primary) hypertension: Secondary | ICD-10-CM | POA: Diagnosis not present

## 2017-03-31 DIAGNOSIS — Z86718 Personal history of other venous thrombosis and embolism: Secondary | ICD-10-CM | POA: Diagnosis not present

## 2017-03-31 DIAGNOSIS — L89223 Pressure ulcer of left hip, stage 3: Secondary | ICD-10-CM | POA: Diagnosis not present

## 2017-03-31 DIAGNOSIS — S51002A Unspecified open wound of left elbow, initial encounter: Secondary | ICD-10-CM | POA: Diagnosis not present

## 2017-03-31 DIAGNOSIS — E119 Type 2 diabetes mellitus without complications: Secondary | ICD-10-CM | POA: Diagnosis not present

## 2017-03-31 DIAGNOSIS — L729 Follicular cyst of the skin and subcutaneous tissue, unspecified: Secondary | ICD-10-CM | POA: Diagnosis not present

## 2017-04-09 ENCOUNTER — Non-Acute Institutional Stay: Payer: Medicare Other | Admitting: Family

## 2017-04-09 DIAGNOSIS — L89220 Pressure ulcer of left hip, unstageable: Secondary | ICD-10-CM

## 2017-04-09 DIAGNOSIS — M25551 Pain in right hip: Secondary | ICD-10-CM | POA: Diagnosis not present

## 2017-04-09 DIAGNOSIS — E1121 Type 2 diabetes mellitus with diabetic nephropathy: Secondary | ICD-10-CM

## 2017-04-09 DIAGNOSIS — I5032 Chronic diastolic (congestive) heart failure: Secondary | ICD-10-CM

## 2017-04-09 NOTE — Progress Notes (Signed)
Location:  [place] Room Number: 23 Place of Service:  ALF [phone]) Provider: Judeen Geralds FNP-C   Blanchie Serve, MD  Patient Care Team: Blanchie Serve, MD as PCP - General (Internal Medicine) Rutherford Guys, MD as Consulting Physician (Ophthalmology) Yanely Mast, Nelda Bucks, NP as Nurse Practitioner [facility])  Extended Emergency Contact Information Primary Emergency Contact: Crombie,Leonard Address: [address] [place] [address] Johnnette Litter of [place] Phone: [phone] Work Phone: [phone] Mobile Phone: [phone] Relation: Son Secondary Emergency Contact: Milo of [place] Phone: [phone] Relation: Friend  Code Status:  DNR Goals of care: Advanced Directive information Advanced Directives 03/11/2017  Does Patient Have a Medical Advance Directive? Yes  Type of Advance Directive Out of facility DNR (pink MOST or yellow form);Living will;Healthcare Power of Attorney  Does patient want to make changes to medical advance directive? -  Copy of [place] in Chart? Yes  Would patient like information on creating a medical advance directive? -  Pre-existing out of facility DNR order (yellow form or pink MOST form) Yellow form placed in chart (order not valid for inpatient use);Pink MOST form placed in chart (order not valid for inpatient use)     Chief Complaint  Patient presents with  . Medical Management of Chronic Issues    Routine visit     HPI:  Pt is a 83 y.o. female seen today [place] for medical management of chronic diseases.she has a medical history of HTN, Type 2 DM,CHF,CKD stage 3,DVT,chronic back pain among other conditions.she is seen in her room today sitting on her recliner working on words puzzles.she states has been doing well except for right hip pain.she has an upcoming appointment with ortho for possible cortisol injection 04/14/2017.she  also has an appointment with [facility] for follow up left trochanter pressure ulcer 03/17/2017. Left ulcer dressing changes continue to be managed by facility nurse.No recent fall episodes or weight changes. CBG's log reviewed readings in the 90's-100 currently off Metformin.    Past Medical History:  Diagnosis Date  . Acute blood loss anemia   . Allergy   . Asthma   . AVM (arteriovenous malformation) of colon with hemorrhage   . Bursitis of right hip   . Cataract   . CKD (chronic kidney disease), stage III ([place]) 11/04/2015  . COLONIC POLYPS 03/26/2005   Qualifier: Diagnosis of  By: Talbert Cage CMA ([place]), June    . DIVERTICULOSIS, COLON 03/26/2005   Qualifier: Diagnosis of  By: Talbert Cage CMA ([place]), June    . DVT (deep venous thrombosis), left 11/03/2015  . Ecchymosis 12/16/2015  . Hyperglycemia 12/16/2015  . Hyperlipidemia   . Hypertension   . Multiple skin tears 12/16/2015  . Osteopenia   . Stasis edema of both lower extremities 11/04/2015  . Unstable gait 12/16/2015  . Urine incontinence 12/16/2015   Past Surgical History:  Procedure Laterality Date  . ABDOMINAL HYSTERECTOMY  2006   Dr. Maryelizabeth Rowan  . CATARACT EXTRACTION W/ INTRAOCULAR LENS IMPLANT Bilateral 1999/2003   Dr. Rutherford Guys  . COLONOSCOPY N/A 11/06/2015   Procedure: COLONOSCOPY;  Surgeon: Mauri Pole, MD;  [facility];  Service: Endoscopy;  Laterality: N/A;  MAC if available, otherwise moderate sedation    Allergies  Allergen Reactions  . Epinephrine Nausea Only    Unknown.  Marland Kitchen Hydrocodone-Acetaminophen     REACTION: nausea  . Labetalol   .  Procaine     Allergies as of 04/09/2017      Reactions   Epinephrine Nausea Only   Unknown.   Hydrocodone-acetaminophen    REACTION: nausea   Labetalol    Procaine       Medication List        Accurate as of 04/09/17  1:55 PM. Always use your most recent med list.          acetaminophen 500 MG tablet Commonly known as:  TYLENOL Take 500 mg by  mouth 2 (two) times daily.   acetaminophen 325 MG tablet Commonly known as:  TYLENOL Take 650 mg by mouth every 4 (four) hours as needed.   calcium carbonate 500 MG chewable tablet Commonly known as:  TUMS - dosed in mg elemental calcium Chew 2 tablets by mouth daily.   furosemide 20 MG tablet Commonly known as:  LASIX Take 1 tablet (20 mg total) by mouth daily.   gabapentin 100 MG capsule Commonly known as:  NEURONTIN Take 300 mg by mouth 2 (two) times daily.   HYDROcodone-acetaminophen 5-325 MG tablet Commonly known as:  NORCO/VICODIN Take 1 tablet by mouth 3 (three) times daily.   Lidocaine 4 % Ptch Apply 1 application topically every 12 (twelve) hours. Right hip   loperamide 2 MG tablet Commonly known as:  IMODIUM A-D Take 2 mg by mouth as needed for diarrhea or loose stools.   multivitamin with minerals tablet Take 1 tablet by mouth daily.       Review of Systems  Constitutional: Negative for activity change, appetite change, chills, fatigue and fever.  HENT: Negative for congestion, rhinorrhea, sinus pressure, sinus pain, sneezing, sore throat and trouble swallowing.   Eyes: Negative for discharge, redness and itching.       Wears eye glasses   Respiratory: Negative for cough, chest tightness, shortness of breath and wheezing.   Cardiovascular: Positive for leg swelling. Negative for chest pain and palpitations.  Gastrointestinal: Negative for abdominal distention, abdominal pain, constipation, diarrhea, nausea and vomiting.       LBM 04/08/2017  Endocrine: Negative for cold intolerance, heat intolerance, polydipsia, polyphagia and polyuria.  Genitourinary: Negative for dysuria, flank pain, frequency and urgency.  Musculoskeletal: Positive for back pain and gait problem.  Skin: Negative for color change, pallor and rash.       Left trochanter ulcer [facility] next appointment 03/17/2017 at 1Pm   Neurological: Negative for dizziness, seizures,  syncope, light-headedness and headaches.  Hematological: Does not bruise/bleed easily.  Psychiatric/Behavioral: Negative for agitation, confusion, hallucinations and sleep disturbance. The patient is not nervous/anxious.     Immunization History  Administered Date(s) Administered  . DTaP 05/13/2005  . Influenza-Unspecified 02/11/2015, 02/22/2016, 02/19/2017  . PPD Test 12/08/2015, 12/22/2015  . Pneumococcal Conjugate-13 05/13/2014  . Pneumococcal Polysaccharide-23 05/13/2014  . Td 12/11/2005  . Zoster 05/13/2014   Pertinent  Health Maintenance Due  Topic Date Due  . FOOT EXAM  09/12/1935  . OPHTHALMOLOGY EXAM  09/12/1935  . URINE MICROALBUMIN  09/12/1935  . PNA vac Low Risk Adult (2 of 2 - PPSV23) 05/14/2015  . HEMOGLOBIN A1C  09/10/2017  . INFLUENZA VACCINE  Completed  . DEXA SCAN  Completed   Fall Risk  12/30/2016 11/03/2015  Falls in the past year? No No    Vitals:   04/09/17 1331  BP: 122/80  Pulse: 66  Resp: 18  Temp: (!) 97.3 F (36.3 C)  SpO2: 93%  Weight: 155 lb 6.4  oz (70.5 kg)  Height: 5' 3"  (1.6 m)   Body mass index is 27.53 kg/m. Physical Exam  Constitutional: She is oriented to person, place, and time. She appears well-developed and well-nourished. No distress.  Elderly   HENT:  Head: Normocephalic.  Mouth/Throat: Oropharynx is clear and moist. No oropharyngeal exudate.  Eyes: Conjunctivae and EOM are normal. Pupils are equal, round, and reactive to light. Right eye exhibits no discharge. Left eye exhibits no discharge. No scleral icterus.  Neck: No JVD present. No thyromegaly present.  Cardiovascular: Normal rate, regular rhythm, normal heart sounds and intact distal pulses. Exam reveals no gallop and no friction rub.  No murmur heard. Pulmonary/Chest: Effort normal and breath sounds normal. No respiratory distress. She has no wheezes. She has no rales.  Abdominal: Soft. Bowel sounds are normal. She exhibits no distension. There is no tenderness. There  is no rebound and no guarding.  Genitourinary:  Genitourinary Comments: Continent for bowel and bladder  Musculoskeletal: She exhibits no tenderness.  Moves x 4 extremities except limited ROM right hip due to pain. Unsteady gait uses walker. Bilateral lower extremities trace-1+ edema. Knee high socks in place.   Lymphadenopathy:    She has no cervical adenopathy.  Neurological: She is oriented to person, place, and time. Coordination normal.  Skin: Skin is warm and dry. No rash noted. No erythema.  Left trochanter unstageable ulcer with very small opening  progressive healing. No drainage or tenderness noted. Peri wound without any redness.    Psychiatric: She has a normal mood and affect.    Labs reviewed: Recent Labs    07/11/16 01/16/17 02/06/17  NA 142 141 142  K 4.0 4.1 4.0  CL  --   --  104  CO2  --   --  29  BUN 49* 27* 27*  CREATININE 1.3* 1.2* 1.19  CALCIUM  --   --  9.0   Recent Labs    07/11/16 01/16/17  AST 21 25  ALT 10 15  ALKPHOS 36 43   Recent Labs    07/11/16 12/16/16  WBC 10.5 8.4  HGB 13.6 12.4  HCT 43 39  PLT 239 255   Lab Results  Component Value Date   TSH 1.84 01/16/2017   Lab Results  Component Value Date   HGBA1C 5.7 03/13/2017   Lab Results  Component Value Date   CHOL 150 03/13/2017   HDL 77 (A) 03/13/2017   LDLCALC 65 03/13/2017   TRIG 80 03/13/2017    Significant Diagnostic Results in last 30 days:  No results found.  Assessment/Plan 1. Type 2 diabetes mellitus with diabetic nephropathy, without long-term current use of insulin  Lab Results  Component Value Date   HGBA1C 5.7 03/13/2017   CBG's readings in the 90's-100 currently off metformin.Will discontinue CBG checks for now.Continue low carbo diet and encourage to walk daily. Monitor Hgb A1C.   2. Chronic diastolic congestive heart failure  Appears compensated.Lower extremities trace-1+ edema. Exam findings negative for cough, wheezing, shortness of breath or rales.No  recent abrupt weight changes.continue on Furosemide 20 mg tablet daily.CMP 04/10/2017  3. Right hip pain Chronic.Continue to follow up with Ortho has upcoming appointment 04/14/2017.continue on Lidocaine patch,Norco 5-325 mg tablet three times daily and Tylenol.Fall and safety precautions.   4. Pressure ulcer of trochanter, left, unstageable  unstageable ulcer with very small opening progressive healing. No drainage or tenderness noted. Peri wound without any redness.Has orders to pack with 1/4" iodoform and cover with foam  dressing.No tunneling noted.Follow up with [facility] as directed 04/16/2017.Continue MVI,overlay mattress and chair gel cushion.Check CBC/diff and CMP 04/10/2017.    Family/ staff Communication: Reviewed plan of care with patient and facility Nurse supervisor   Labs/tests ordered: None   Sandrea Hughs, NP

## 2017-04-10 ENCOUNTER — Encounter: Payer: Self-pay | Admitting: *Deleted

## 2017-04-10 DIAGNOSIS — H353131 Nonexudative age-related macular degeneration, bilateral, early dry stage: Secondary | ICD-10-CM | POA: Diagnosis not present

## 2017-04-10 DIAGNOSIS — E1121 Type 2 diabetes mellitus with diabetic nephropathy: Secondary | ICD-10-CM | POA: Diagnosis not present

## 2017-04-10 DIAGNOSIS — I10 Essential (primary) hypertension: Secondary | ICD-10-CM | POA: Diagnosis not present

## 2017-04-10 DIAGNOSIS — H5213 Myopia, bilateral: Secondary | ICD-10-CM | POA: Diagnosis not present

## 2017-04-10 DIAGNOSIS — M6281 Muscle weakness (generalized): Secondary | ICD-10-CM | POA: Diagnosis not present

## 2017-04-10 DIAGNOSIS — H524 Presbyopia: Secondary | ICD-10-CM | POA: Diagnosis not present

## 2017-04-10 DIAGNOSIS — H52203 Unspecified astigmatism, bilateral: Secondary | ICD-10-CM | POA: Diagnosis not present

## 2017-04-10 DIAGNOSIS — E119 Type 2 diabetes mellitus without complications: Secondary | ICD-10-CM | POA: Diagnosis not present

## 2017-04-10 LAB — HEPATIC FUNCTION PANEL
ALK PHOS: 47 (ref 25–125)
ALT: 12 (ref 7–35)
AST: 21 (ref 13–35)
BILIRUBIN, TOTAL: 0.6

## 2017-04-10 LAB — CBC
HCT: 37.7
HEMOGLOBIN: 11.9
PLATELET COUNT: 202
WBC: 6.2

## 2017-04-10 LAB — COMPLETE METABOLIC PANEL WITH GFR
ALT: 12
AST: 21
Albumin: 3.4
Alkaline Phosphatase: 47
BUN: 30 — AB (ref 4–21)
CALCIUM: 9.2
CREATININE: 1.11
Glucose: 81
POTASSIUM: 4.1
SODIUM: 141
TOTAL PROTEIN: 5.8 g/dL
Total Bilirubin: 0.6

## 2017-04-10 LAB — BASIC METABOLIC PANEL
CREATININE: 1.1 (ref 0.5–1.1)
GLUCOSE: 81
Potassium: 4.1 (ref 3.4–5.3)
SODIUM: 141 (ref 137–147)

## 2017-04-10 LAB — CBC AND DIFFERENTIAL
HCT: 38 (ref 36–46)
HEMOGLOBIN: 11.9 — AB (ref 12.0–16.0)
Platelets: 202 (ref 150–399)
WBC: 6.2

## 2017-04-10 LAB — HM DIABETES EYE EXAM

## 2017-04-11 DIAGNOSIS — M5137 Other intervertebral disc degeneration, lumbosacral region: Secondary | ICD-10-CM | POA: Diagnosis not present

## 2017-04-11 DIAGNOSIS — M5416 Radiculopathy, lumbar region: Secondary | ICD-10-CM | POA: Diagnosis not present

## 2017-04-11 DIAGNOSIS — M5117 Intervertebral disc disorders with radiculopathy, lumbosacral region: Secondary | ICD-10-CM | POA: Diagnosis not present

## 2017-04-14 ENCOUNTER — Encounter (HOSPITAL_BASED_OUTPATIENT_CLINIC_OR_DEPARTMENT_OTHER): Payer: Medicare Other | Attending: Internal Medicine

## 2017-04-14 DIAGNOSIS — I10 Essential (primary) hypertension: Secondary | ICD-10-CM | POA: Insufficient documentation

## 2017-04-14 DIAGNOSIS — L89223 Pressure ulcer of left hip, stage 3: Secondary | ICD-10-CM | POA: Insufficient documentation

## 2017-04-14 DIAGNOSIS — Z86718 Personal history of other venous thrombosis and embolism: Secondary | ICD-10-CM | POA: Diagnosis not present

## 2017-04-14 DIAGNOSIS — E119 Type 2 diabetes mellitus without complications: Secondary | ICD-10-CM | POA: Insufficient documentation

## 2017-04-28 DIAGNOSIS — Z86718 Personal history of other venous thrombosis and embolism: Secondary | ICD-10-CM | POA: Diagnosis not present

## 2017-04-28 DIAGNOSIS — E119 Type 2 diabetes mellitus without complications: Secondary | ICD-10-CM | POA: Diagnosis not present

## 2017-04-28 DIAGNOSIS — L89223 Pressure ulcer of left hip, stage 3: Secondary | ICD-10-CM | POA: Diagnosis not present

## 2017-04-28 DIAGNOSIS — I10 Essential (primary) hypertension: Secondary | ICD-10-CM | POA: Diagnosis not present

## 2017-05-02 DIAGNOSIS — L602 Onychogryphosis: Secondary | ICD-10-CM | POA: Diagnosis not present

## 2017-05-02 DIAGNOSIS — M79672 Pain in left foot: Secondary | ICD-10-CM | POA: Diagnosis not present

## 2017-05-02 DIAGNOSIS — M79671 Pain in right foot: Secondary | ICD-10-CM | POA: Diagnosis not present

## 2017-05-08 DIAGNOSIS — M5442 Lumbago with sciatica, left side: Secondary | ICD-10-CM | POA: Diagnosis not present

## 2017-05-08 DIAGNOSIS — G8929 Other chronic pain: Secondary | ICD-10-CM | POA: Diagnosis not present

## 2017-05-08 DIAGNOSIS — M5441 Lumbago with sciatica, right side: Secondary | ICD-10-CM | POA: Diagnosis not present

## 2017-05-08 DIAGNOSIS — M47816 Spondylosis without myelopathy or radiculopathy, lumbar region: Secondary | ICD-10-CM | POA: Diagnosis not present

## 2017-05-26 ENCOUNTER — Encounter (HOSPITAL_BASED_OUTPATIENT_CLINIC_OR_DEPARTMENT_OTHER): Payer: Medicare Other | Attending: Internal Medicine

## 2017-05-26 DIAGNOSIS — E119 Type 2 diabetes mellitus without complications: Secondary | ICD-10-CM | POA: Insufficient documentation

## 2017-05-26 DIAGNOSIS — Z872 Personal history of diseases of the skin and subcutaneous tissue: Secondary | ICD-10-CM | POA: Diagnosis not present

## 2017-05-26 DIAGNOSIS — Z86718 Personal history of other venous thrombosis and embolism: Secondary | ICD-10-CM | POA: Diagnosis not present

## 2017-05-26 DIAGNOSIS — Z09 Encounter for follow-up examination after completed treatment for conditions other than malignant neoplasm: Secondary | ICD-10-CM | POA: Insufficient documentation

## 2017-05-26 DIAGNOSIS — L89223 Pressure ulcer of left hip, stage 3: Secondary | ICD-10-CM | POA: Diagnosis not present

## 2017-05-30 DIAGNOSIS — M533 Sacrococcygeal disorders, not elsewhere classified: Secondary | ICD-10-CM | POA: Diagnosis not present

## 2017-06-16 DIAGNOSIS — I10 Essential (primary) hypertension: Secondary | ICD-10-CM | POA: Diagnosis not present

## 2017-06-16 DIAGNOSIS — E782 Mixed hyperlipidemia: Secondary | ICD-10-CM | POA: Diagnosis not present

## 2017-06-16 DIAGNOSIS — R739 Hyperglycemia, unspecified: Secondary | ICD-10-CM | POA: Diagnosis not present

## 2017-06-16 LAB — LIPID PANEL
Cholesterol: 211 — AB (ref 0–200)
HDL: 74 — AB (ref 35–70)
LDL CALC: 117
Triglycerides: 96 (ref 40–160)

## 2017-07-04 ENCOUNTER — Encounter: Payer: Self-pay | Admitting: Internal Medicine

## 2017-07-04 ENCOUNTER — Non-Acute Institutional Stay: Payer: Medicare Other | Admitting: Internal Medicine

## 2017-07-04 DIAGNOSIS — I5032 Chronic diastolic (congestive) heart failure: Secondary | ICD-10-CM | POA: Diagnosis not present

## 2017-07-04 DIAGNOSIS — N183 Chronic kidney disease, stage 3 unspecified: Secondary | ICD-10-CM

## 2017-07-04 DIAGNOSIS — M85852 Other specified disorders of bone density and structure, left thigh: Secondary | ICD-10-CM | POA: Diagnosis not present

## 2017-07-04 DIAGNOSIS — E1121 Type 2 diabetes mellitus with diabetic nephropathy: Secondary | ICD-10-CM

## 2017-07-04 DIAGNOSIS — E782 Mixed hyperlipidemia: Secondary | ICD-10-CM | POA: Diagnosis not present

## 2017-07-04 DIAGNOSIS — Z872 Personal history of diseases of the skin and subcutaneous tissue: Secondary | ICD-10-CM | POA: Diagnosis not present

## 2017-07-04 DIAGNOSIS — M4726 Other spondylosis with radiculopathy, lumbar region: Secondary | ICD-10-CM | POA: Insufficient documentation

## 2017-07-04 NOTE — Progress Notes (Signed)
Location:  [place] Room Number: 23 Place of Service:  ALF [phone]) Provider:  Blanchie Serve MD  Blanchie Serve, MD  Patient Care Team: Blanchie Serve, MD as PCP - General (Internal Medicine) Rutherford Guys, MD as Consulting Physician (Ophthalmology) Ngetich, Nelda Bucks, NP as Nurse Practitioner [facility] Medicine)  Extended Emergency Contact Information Primary Emergency Contact: Biebel,Leonard Address: [address] [place] [address] Johnnette Litter of [place] Phone: [phone] Work Phone: [phone] Mobile Phone: [phone] Relation: Son Secondary Emergency Contact: [place] of [place] Phone: [phone] Relation: Friend  Code Status:  DNR  Goals of care: Advanced Directive information Advanced Directives 07/04/2017  Does Patient Have a Medical Advance Directive? Yes  Type of Paramedic of [place];Living will;Out of facility DNR (pink MOST or yellow form)  Does patient want to make changes to medical advance directive? No - Patient declined  Copy of [place] in Chart? Yes  Would patient like information on creating a medical advance directive? -  Pre-existing out of facility DNR order (yellow form or pink MOST form) Yellow form placed in chart (order not valid for inpatient use);Pink MOST form placed in chart (order not valid for inpatient use)     Chief Complaint  Patient presents with  . Medical Management of Chronic Issues    Routine Visit     HPI:  Pt is a 84 y.o. female seen today for medical management of chronic diseases. She complaints of ongoing back and right hip pain. Pain medication helps. She has stiffness most prominent in the morning that improves over the day. Worse pain is of 5/10 and when improved 2/10. Has been wearing her ted hose. Currently off medications for diabetes.    Past Medical History:  Diagnosis Date  . Acute blood  loss anemia   . Allergy   . Asthma   . AVM (arteriovenous malformation) of colon with hemorrhage   . Bursitis of right hip   . Cataract   . CKD (chronic kidney disease), stage III ([place]) 11/04/2015  . COLONIC POLYPS 03/26/2005   Qualifier: Diagnosis of  By: Talbert Cage CMA ([place]), June    . DIVERTICULOSIS, COLON 03/26/2005   Qualifier: Diagnosis of  By: Talbert Cage CMA ([place]), June    . DVT (deep venous thrombosis), left 11/03/2015  . Ecchymosis 12/16/2015  . Hyperglycemia 12/16/2015  . Hyperlipidemia   . Hypertension   . Multiple skin tears 12/16/2015  . Osteopenia   . Stasis edema of both lower extremities 11/04/2015  . Unstable gait 12/16/2015  . Urine incontinence 12/16/2015   Past Surgical History:  Procedure Laterality Date  . ABDOMINAL HYSTERECTOMY  2006   Dr. Maryelizabeth Rowan  . CATARACT EXTRACTION W/ INTRAOCULAR LENS IMPLANT Bilateral 1999/2003   Dr. Rutherford Guys  . COLONOSCOPY N/A 11/06/2015   Procedure: COLONOSCOPY;  Surgeon: Mauri Pole, MD;  Location: [facility];  Service: Endoscopy;  Laterality: N/A;  MAC if available, otherwise moderate sedation    Allergies  Allergen Reactions  . Epinephrine Nausea Only    Unknown.  Marland Kitchen Hydrocodone-Acetaminophen     REACTION: nausea  . Labetalol   . Procaine     Outpatient Encounter Medications as of 07/04/2017  Medication Sig  . acetaminophen (TYLENOL) 500 MG tablet Take 500 mg by mouth 2 (two) times daily.   . calcium carbonate (TUMS - DOSED IN MG ELEMENTAL CALCIUM) 500 MG chewable tablet Chew  2 tablets by mouth daily.   . furosemide (LASIX) 20 MG tablet Take 1 tablet (20 mg total) by mouth daily.  Marland Kitchen gabapentin (NEURONTIN) 100 MG capsule Take 300 mg by mouth 2 (two) times daily.   Marland Kitchen HYDROcodone-acetaminophen (NORCO/VICODIN) 5-325 MG tablet Take 1 tablet by mouth 3 (three) times daily.  . Lidocaine 4 % PTCH Apply 1 application topically every 12 (twelve) hours. Right hip  . loperamide (IMODIUM A-D) 2 MG tablet Take 2 mg by mouth as needed  for diarrhea or loose stools.  . Multiple Vitamins-Minerals (ICAPS MV PO) Take 1 tablet by mouth daily.  . Multiple Vitamins-Minerals (MULTIVITAMIN WITH MINERALS) tablet Take 1 tablet by mouth daily.   . [DISCONTINUED] acetaminophen (TYLENOL) 325 MG tablet Take 650 mg by mouth every 4 (four) hours as needed.   No facility-administered encounter medications on file as of 07/04/2017.     Review of Systems  Constitutional: Negative for appetite change, chills and fever.  HENT: Positive for hearing loss. Negative for congestion, ear discharge, ear pain, mouth sores, sinus pressure, sinus pain and trouble swallowing.   Respiratory: Negative for cough, shortness of breath and wheezing.   Cardiovascular: Positive for leg swelling. Negative for chest pain and palpitations.  Gastrointestinal: Negative for abdominal pain, blood in stool, constipation, diarrhea, nausea, rectal pain and vomiting.  Genitourinary: Negative for dysuria, flank pain, hematuria and urgency.       Wakes up once at night to urinate  Musculoskeletal: Positive for back pain and gait problem.       Mild pain to right hand joints that improves with rest.   Skin: Negative for rash.  Neurological: Negative for dizziness, seizures and headaches.  Psychiatric/Behavioral: Negative for behavioral problems, confusion and dysphoric mood.    Immunization History  Administered Date(s) Administered  . DTaP 05/13/2005  . Influenza-Unspecified 02/11/2015, 02/22/2016, 02/19/2017  . PPD Test 12/08/2015, 12/22/2015  . Pneumococcal Conjugate-13 05/13/2014  . Pneumococcal Polysaccharide-23 05/13/2014  . Td 12/11/2005  . Zoster 05/13/2014   Pertinent  Health Maintenance Due  Topic Date Due  . FOOT EXAM  09/12/1935  . URINE MICROALBUMIN  09/12/1935  . PNA vac Low Risk Adult (2 of 2 - PPSV23) 05/14/2015  . HEMOGLOBIN A1C  09/10/2017  . OPHTHALMOLOGY EXAM  04/10/2018  . INFLUENZA VACCINE  Completed  . DEXA SCAN  Completed   Fall Risk   12/30/2016 11/03/2015  Falls in the past year? No No   Functional Status Survey:    Vitals:   07/04/17 0927  BP: 107/68  Pulse: 67  Resp: 18  Temp: 97.8 F (36.6 C)  TempSrc: Oral  SpO2: 96%  Weight: 151 lb 6.4 oz (68.7 kg)  Height: _0  (1.6 m)   Body mass index is 26.82 kg/m.   Wt Readings from Last 3 Encounters:  07/04/17 151 lb 6.4 oz (68.7 kg)  04/09/17 155 lb 6.4 oz (70.5 kg)  03/11/17 155 lb 12.8 oz (70.7 kg)   Physical Exam  Constitutional: She is oriented to person, place, and time. She appears well-developed and well-nourished. No distress.  HENT:  Head: Normocephalic and atraumatic.  Right Ear: External ear normal.  Left Ear: External ear normal.  Nose: Nose normal.  Mouth/Throat: Oropharynx is clear and moist. No oropharyngeal exudate.  Eyes: Conjunctivae and EOM are normal. Pupils are equal, round, and reactive to light. Right eye exhibits no discharge. Left eye exhibits no discharge. No scleral icterus.  Neck: Normal range of motion. Neck supple.  Cardiovascular: Normal  rate and regular rhythm.  Pulmonary/Chest: Effort normal and breath sounds normal.  Abdominal: Soft. Bowel sounds are normal. There is no tenderness.  Musculoskeletal: She exhibits edema.  Unsteady gait, uses a walker, tenderness to right lumbar paraspinal area  Lymphadenopathy:    She has no cervical adenopathy.  Neurological: She is alert and oriented to person, place, and time. She exhibits normal muscle tone.  Skin: Skin is warm and dry. She is not diaphoretic.  Left elbow 2 sebaceous cyst, non tender  Psychiatric: She has a normal mood and affect.    Labs reviewed: Recent Labs    01/16/17 02/06/17 04/10/17  NA 141 142 141  141  K 4.1 4.0 4.1  4.1  CL  --  104  --   CO2  --  29  --   BUN 27* 27* 30*  CREATININE 1.2* 1.19 1.1  1.11  CALCIUM  --  9.0 9.2   Recent Labs    01/16/17 04/10/17  AST _0 ALT _1 ALKPHOS 43 47  47  BILITOT  --  0.6  PROT  --   5.8  ALBUMIN  --  3.4   Recent Labs    07/11/16 12/16/16 04/10/17  WBC 10.5 8.4 6.2  6.2  HGB 13.6 12.4 11.9*  11.9  HCT 43 39 38  37.7  PLT 239 255 202   Lab Results  Component Value Date   TSH 1.84 01/16/2017   Lab Results  Component Value Date   HGBA1C 5.7 03/13/2017   Lab Results  Component Value Date   CHOL 211 (A) 06/16/2017   HDL 74 (A) 06/16/2017   LDLCALC 117 06/16/2017   TRIG 96 06/16/2017    Significant Diagnostic Results in last 30 days:  No results found.  Assessment/Plan  ckd stage 3 Check bmp  Lumbar radiculopathy Continue lidocaine and norco 5-325 mg tid. Add meloxicam 7.5 mg daily for now to help with stiffness. Walker for mobility.   Type 2 diabetes with ckd 3 a1c 6, diet controlled. Off medications. Monitor clinically.   Hyperlipidemia Reviewed lipid panel, worsened LDL. Encouraged to cut down on fried food and sweets. Given her age, will defer statin for now. Recheck in 3 months. HDL at goal.   PVD Apply ted hose, keep legs elevated at rest. Provide skin care. C/w lasix.   History of pressure ulcer Healed, provide skin care, monitor for skin breakdown  Osteopenia T score 2015 -2.3 at left femur. Currently on calcium carbonate 1000 mg daily. Patient did not tolerate oscal in past. Add vitamin d 1000 u daily. Order dexa scan.    Family/ staff Communication: reviewed care plan with patient and charge nurse.   Labs/tests ordered:  CBC, cmp, urine microalbumin   Blanchie Serve, MD Internal Medicine [facility] [address] [place], [place] [address] Cell Phone (Monday-Friday 8 am - 5 pm): [phone] On Call: [phone] and follow prompts after 5 pm and on weekends Office Phone: [phone] Office Fax: [phone]

## 2017-07-07 ENCOUNTER — Encounter: Payer: Self-pay | Admitting: *Deleted

## 2017-07-07 DIAGNOSIS — M6281 Muscle weakness (generalized): Secondary | ICD-10-CM | POA: Diagnosis not present

## 2017-07-07 DIAGNOSIS — R309 Painful micturition, unspecified: Secondary | ICD-10-CM | POA: Diagnosis not present

## 2017-07-07 DIAGNOSIS — E782 Mixed hyperlipidemia: Secondary | ICD-10-CM | POA: Diagnosis not present

## 2017-07-07 DIAGNOSIS — I10 Essential (primary) hypertension: Secondary | ICD-10-CM | POA: Diagnosis not present

## 2017-07-07 DIAGNOSIS — E559 Vitamin D deficiency, unspecified: Secondary | ICD-10-CM | POA: Diagnosis not present

## 2017-07-07 DIAGNOSIS — R509 Fever, unspecified: Secondary | ICD-10-CM | POA: Diagnosis not present

## 2017-07-07 LAB — COMPLETE METABOLIC PANEL WITH GFR
ALK PHOS: 48
ALT: 10
AST: 21
Albumin: 3.6
BUN: 34 — AB (ref 4–21)
CALCIUM: 9.3
CREATININE: 1.14
Glucose: 82
Potassium: 4.2
SODIUM: 142
Total Bilirubin: 0.5
Total Protein: 5.9 g/dL

## 2017-07-07 LAB — MICROALBUMIN, URINE
Creatinine, Ur: 84 mg/dL
MICROALB/CREAT RATIO: 6
Microalb, Ur: 0.5

## 2017-07-07 LAB — CBC
HCT: 37.4
HGB: 12.5
PLATELET COUNT: 197
WBC: 5.6

## 2017-07-08 ENCOUNTER — Encounter: Payer: Self-pay | Admitting: *Deleted

## 2017-07-28 ENCOUNTER — Other Ambulatory Visit: Payer: Self-pay

## 2017-07-28 MED ORDER — HYDROCODONE-ACETAMINOPHEN 5-325 MG PO TABS: 1.0000 | ORAL_TABLET | Freq: Three times a day (TID) | ORAL | 0 refills | Status: DC

## 2017-08-27 DIAGNOSIS — M810 Age-related osteoporosis without current pathological fracture: Secondary | ICD-10-CM | POA: Diagnosis not present

## 2017-08-27 LAB — HM DEXA SCAN

## 2017-09-19 IMAGING — US US EXTREM LOW VENOUS*L*
1 series · 13 of 24 positions shown · non-contrast
Comparison: None.

CLINICAL DATA: Left lower extremity bruising and swelling for three
days



[Series 1: us extrem low venous*left* · 0.07mm/px · 13 of 31 slices shown]
[im 1/31]
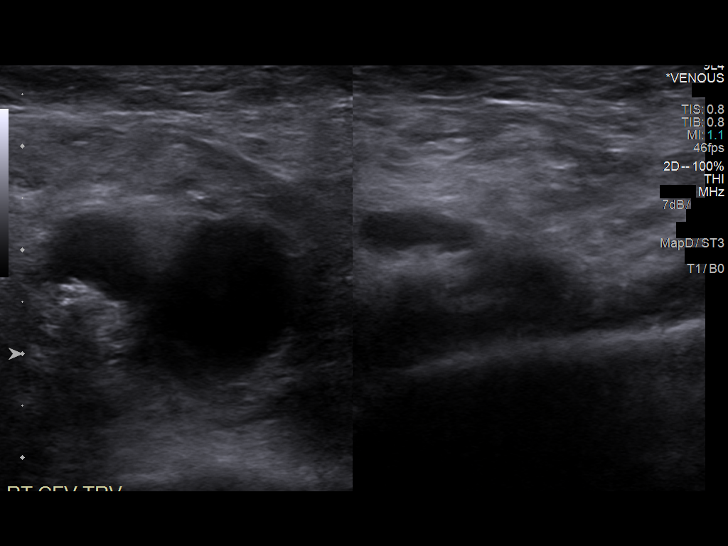
[im 3/31]
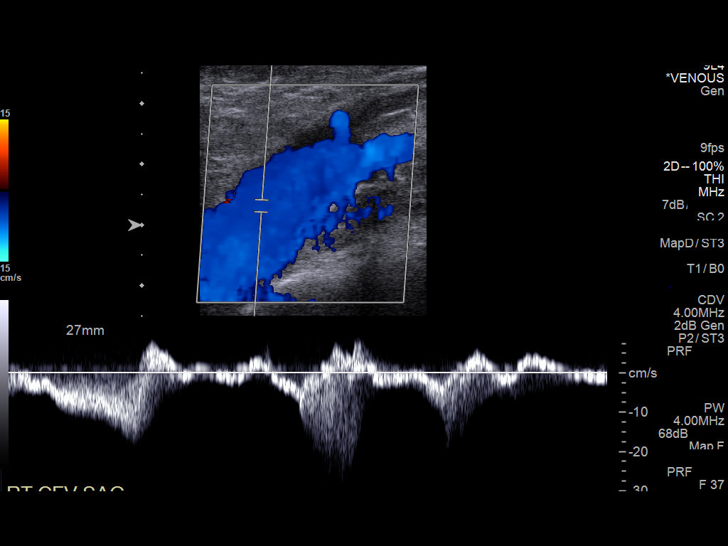
[im 6/31]
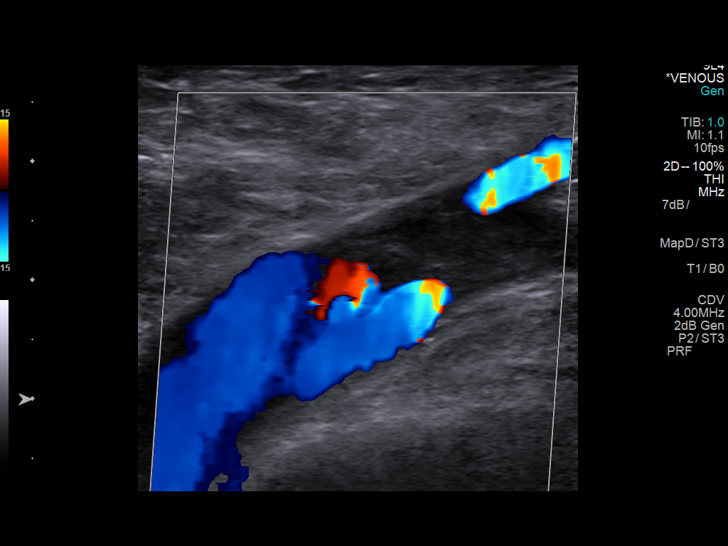
[im 8/31]
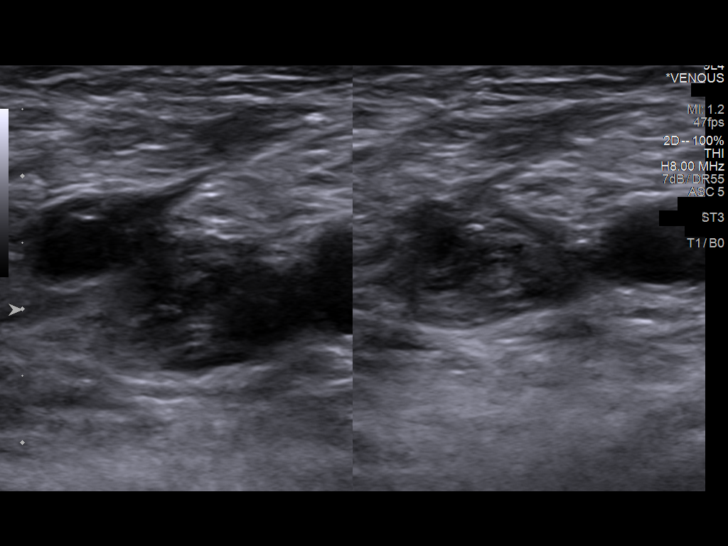
[im 11/31]
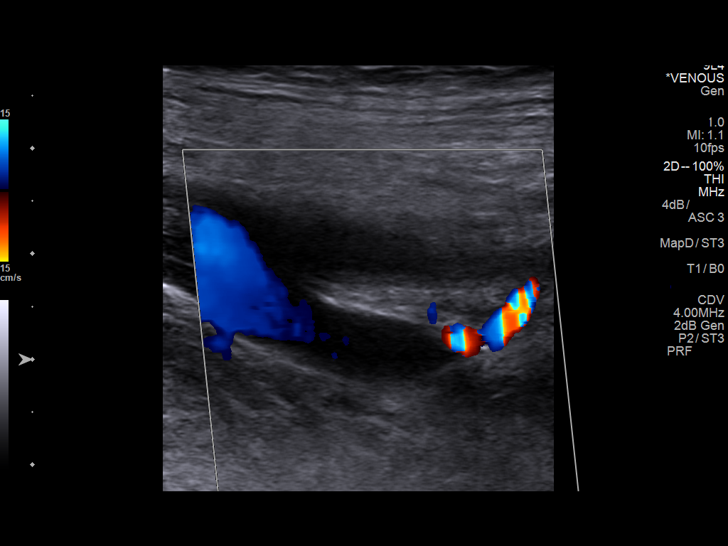
[im 14/31]
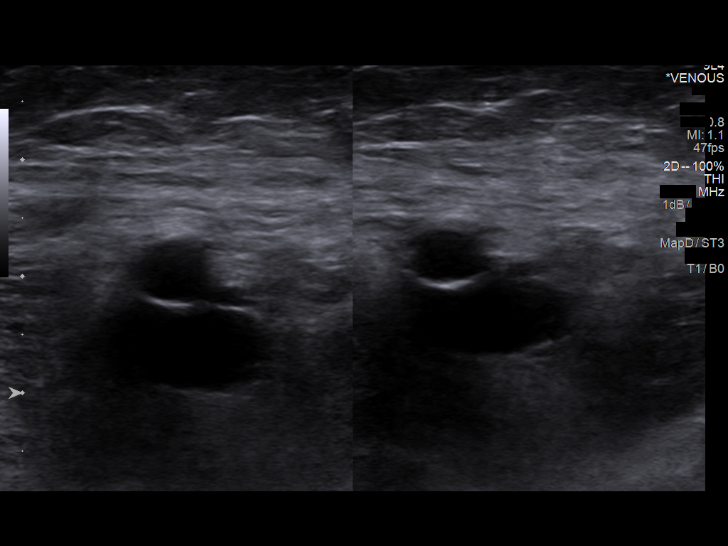
[im 16/31]
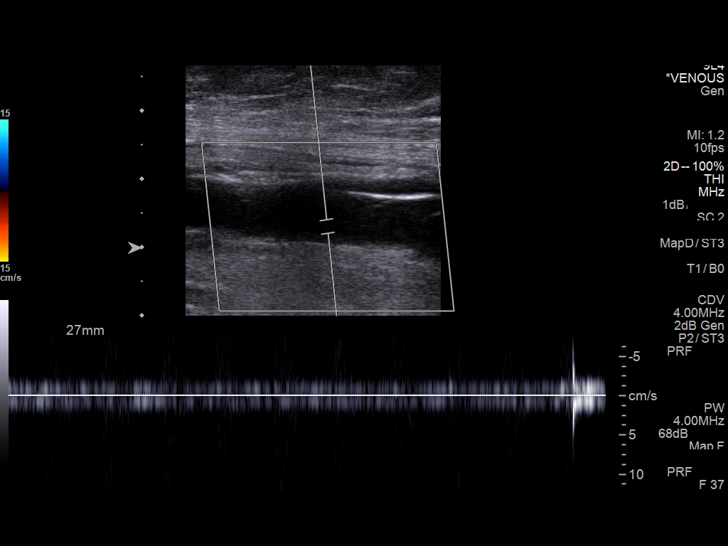
[im 17/31]
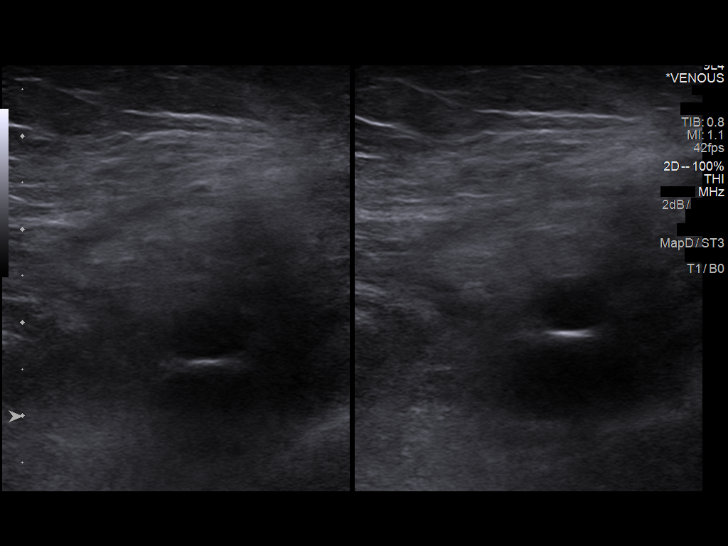
[im 20/31]
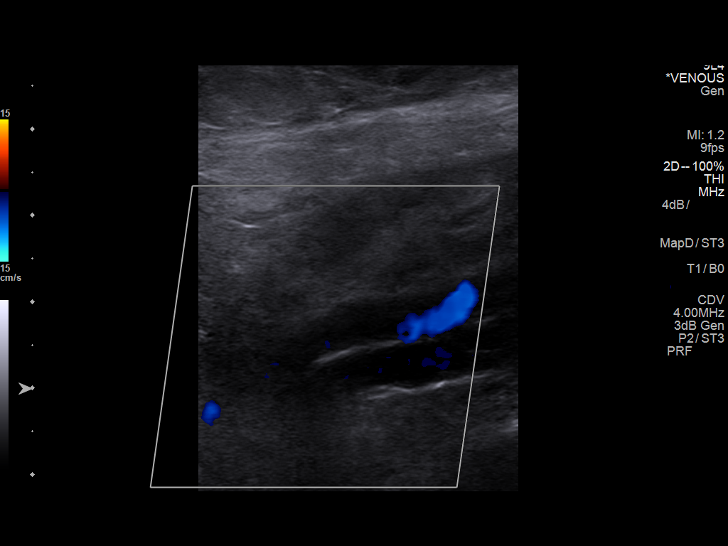
[im 23/31]
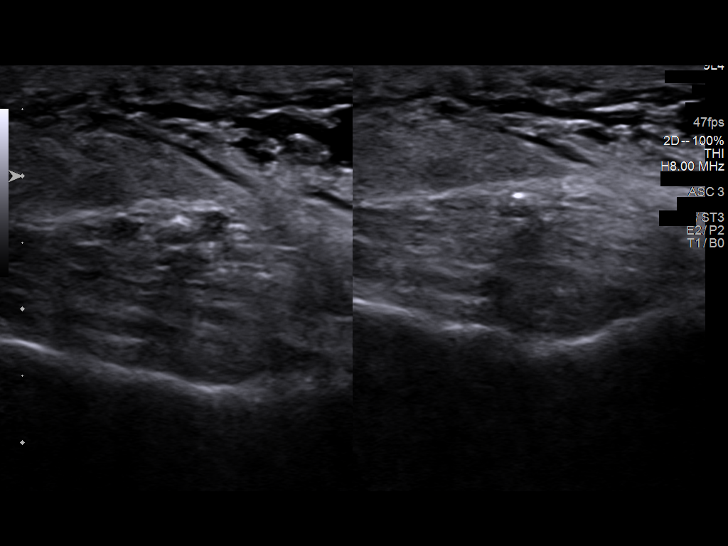
[im 25/31]
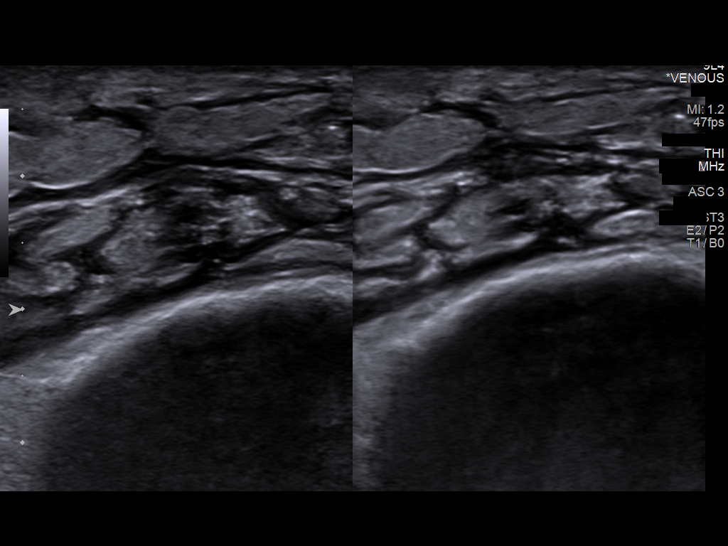
[im 28/31]
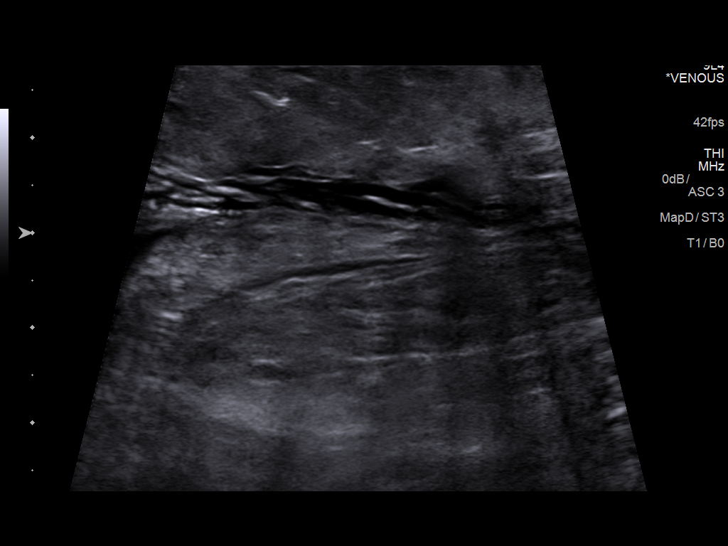
[im 31/31]
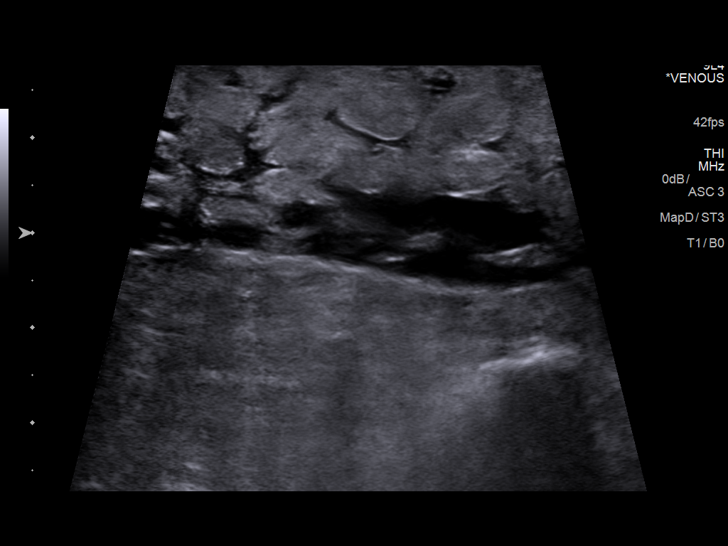

[13 of 24 positions shown; findings below may reference images not displayed]

FINDINGS: Contralateral Common Femoral Vein: Respiratory phasicity is normal
and symmetric with the symptomatic side. No evidence of thrombus.
Normal compressibility.

Common Femoral Vein: Partially occlusive thrombus with decreased
compressibility.

Saphenofemoral Junction: Partially occlusive thrombus with decreased
compressability.

Profunda Femoral Vein: The vein is not compressible and is occluded
by thrombus.

Femoral Vein: The vein is not compressible and is occluded by
thrombus.

Popliteal Vein: Partially occlusive thrombus with decreased
compressability.

Calf Veins:  Not evaluated well due to significant calf edema.

Other Findings:  Significant soft tissue edema.
IMPRESSION: Thrombus through the left lower extremity deep venous system from
common femoral vein through popliteal vein including profunda
femoral vein.

## 2017-09-26 ENCOUNTER — Encounter: Payer: Self-pay | Admitting: Family

## 2017-09-26 ENCOUNTER — Non-Acute Institutional Stay: Payer: Medicare Other | Admitting: Family

## 2017-09-26 DIAGNOSIS — N183 Chronic kidney disease, stage 3 (moderate): Secondary | ICD-10-CM

## 2017-09-26 DIAGNOSIS — M8580 Other specified disorders of bone density and structure, unspecified site: Secondary | ICD-10-CM

## 2017-09-26 DIAGNOSIS — M4726 Other spondylosis with radiculopathy, lumbar region: Secondary | ICD-10-CM

## 2017-09-26 DIAGNOSIS — I739 Peripheral vascular disease, unspecified: Secondary | ICD-10-CM | POA: Insufficient documentation

## 2017-09-26 DIAGNOSIS — I5032 Chronic diastolic (congestive) heart failure: Secondary | ICD-10-CM | POA: Diagnosis not present

## 2017-09-26 DIAGNOSIS — I130 Hypertensive heart and chronic kidney disease with heart failure and stage 1 through stage 4 chronic kidney disease, or unspecified chronic kidney disease: Secondary | ICD-10-CM | POA: Diagnosis not present

## 2017-09-26 NOTE — Progress Notes (Signed)
Location:  [place] Room Number: 23 Place of Service:  ALF [phone]) Provider: Keandre Linden FNP-C   Blanchie Serve, MD  Patient Care Team: Blanchie Serve, MD as PCP - General (Internal Medicine) Rutherford Guys, MD as Consulting Physician (Ophthalmology) Kaleea Penner, Nelda Bucks, NP as Nurse Practitioner [facility] Medicine)  Extended Emergency Contact Information Primary Emergency Contact: Rue,Leonard Address: [place] [address] Johnnette Litter of [place] Phone: [phone] Work Phone: [phone] Mobile Phone: [phone] Relation: Son Secondary Emergency Contact: [place] of [place] Phone: [phone] Relation: Friend  Code Status:  DNR  Goals of care: Advanced Directive information Advanced Directives 09/26/2017  Does Patient Have a Medical Advance Directive? Yes  Type of Paramedic of [place];Out of facility DNR (pink MOST or yellow form);Living will  Does patient want to make changes to medical advance directive? -  Copy of [place] in Chart? Yes  Would patient like information on creating a medical advance directive? -  Pre-existing out of facility DNR order (yellow form or pink MOST form) Yellow form placed in chart (order not valid for inpatient use);Pink MOST form placed in chart (order not valid for inpatient use)     Chief Complaint  Patient presents with  . Medical Management of Chronic Issues    HPI:  Pt is a 84 y.o. female seen today [place] for medical management of chronic diseases. She has a medical history of HTN,CHF,OA, chronic right hip pain, controlled Type 2 DM,PVD,CKD stage 3 among other conditions.she is seen in her room today.she denies any new acute issues during visit.she continues to participates in facility activities.Her right hip/back pain is under control with current regimen but states pain is worst in the  mornings but improves as the day goes. Her Bone Dexa Scan resulted t-score -1.1 showing osteopenia. No recent fall episodes or fractures.she is currently on vitamin D supplement and calcium tablets.she has had a 2.2 lbs weight loss over two months though diuresis could be a contributory factor.she states has a good appetite.    Past Medical History:  Diagnosis Date  . Acute blood loss anemia   . Allergy   . Asthma   . AVM (arteriovenous malformation) of colon with hemorrhage   . Bursitis of right hip   . Cataract   . CKD (chronic kidney disease), stage III ([place]) 11/04/2015  . COLONIC POLYPS 03/26/2005   Qualifier: Diagnosis of  By: Talbert Cage CMA ([place]), June    . DIVERTICULOSIS, COLON 03/26/2005   Qualifier: Diagnosis of  By: Talbert Cage CMA ([place]), June    . DVT (deep venous thrombosis), left 11/03/2015  . Ecchymosis 12/16/2015  . Hyperglycemia 12/16/2015  . Hyperlipidemia   . Hypertension   . Multiple skin tears 12/16/2015  . Osteopenia   . Stasis edema of both lower extremities 11/04/2015  . Unstable gait 12/16/2015  . Urine incontinence 12/16/2015   Past Surgical History:  Procedure Laterality Date  . ABDOMINAL HYSTERECTOMY  2006   Dr. Maryelizabeth Rowan  . CATARACT EXTRACTION W/ INTRAOCULAR LENS IMPLANT Bilateral 1999/2003   Dr. Rutherford Guys  . COLONOSCOPY N/A 11/06/2015   Procedure: COLONOSCOPY;  Surgeon: Mauri Pole, MD;  [facility];  Service: Endoscopy;  Laterality: N/A;  MAC if available, otherwise moderate sedation    Allergies  Allergen Reactions  . Epinephrine Nausea Only    Unknown.  Marland Kitchen Hydrocodone-Acetaminophen  REACTION: nausea  . Labetalol   . Procaine     Allergies as of 09/26/2017      Reactions   Epinephrine Nausea Only   Unknown.   Hydrocodone-acetaminophen    REACTION: nausea   Labetalol    Procaine       Medication List        Accurate as of 09/26/17  4:47 PM. Always use your most recent med list.          acetaminophen 500 MG  tablet Commonly known as:  TYLENOL Take 500 mg by mouth 2 (two) times daily.   calcium carbonate 500 MG chewable tablet Commonly known as:  TUMS - dosed in mg elemental calcium Chew 2 tablets by mouth daily.   cholecalciferol 1000 units tablet Commonly known as:  VITAMIN D Take 1,000 Units by mouth daily.   furosemide 20 MG tablet Commonly known as:  LASIX Take 1 tablet (20 mg total) by mouth daily.   gabapentin 100 MG capsule Commonly known as:  NEURONTIN Take 300 mg by mouth 2 (two) times daily.   HYDROcodone-acetaminophen 5-325 MG tablet Commonly known as:  NORCO/VICODIN Take 1 tablet by mouth 3 (three) times daily.   Lidocaine 4 % Ptch Apply 1 application topically every 12 (twelve) hours. Right hip   loperamide 2 MG tablet Commonly known as:  IMODIUM A-D Take 2 mg by mouth as needed for diarrhea or loose stools.   meloxicam 7.5 MG tablet Commonly known as:  MOBIC Take 7.5 mg by mouth daily.   multivitamin with minerals tablet Take 1 tablet by mouth daily.   ICAPS MV PO Take 1 tablet by mouth daily.       Review of Systems  Constitutional: Negative for activity change, appetite change, chills, fatigue and fever.  HENT: Negative for congestion, postnasal drip, rhinorrhea, sinus pressure, sinus pain, sneezing and sore throat.   Eyes: Positive for visual disturbance. Negative for discharge, redness and itching.       Wears eye glasses seen by Ophthalmology in December 2018  Respiratory: Negative for cough, chest tightness, shortness of breath and wheezing.   Cardiovascular: Positive for leg swelling. Negative for chest pain and palpitations.  Gastrointestinal: Negative for abdominal distention, abdominal pain, constipation, diarrhea, nausea and vomiting.  Endocrine: Negative for cold intolerance, heat intolerance, polydipsia, polyphagia and polyuria.  Genitourinary: Negative for dysuria, frequency and urgency.  Musculoskeletal: Positive for arthralgias, back  pain and gait problem.       Right hip pain under control  Skin: Negative for color change, pallor, rash and wound.  Neurological: Negative for dizziness, syncope, light-headedness and headaches.  Hematological: Does not bruise/bleed easily.  Psychiatric/Behavioral: Negative for agitation, confusion and sleep disturbance. The patient is not nervous/anxious.     Immunization History  Administered Date(s) Administered  . DTaP 05/13/2005  . Influenza-Unspecified 02/11/2015, 02/22/2016, 02/19/2017  . PPD Test 12/08/2015, 12/22/2015  . Pneumococcal Conjugate-13 05/13/2014  . Pneumococcal Polysaccharide-23 05/13/2014  . Td 12/11/2005  . Zoster 05/13/2014   Pertinent  Health Maintenance Due  Topic Date Due  . FOOT EXAM  09/12/1935  . PNA vac Low Risk Adult (2 of 2 - PPSV23) 05/14/2015  . HEMOGLOBIN A1C  09/10/2017  . INFLUENZA VACCINE  12/11/2017  . OPHTHALMOLOGY EXAM  04/10/2018  . URINE MICROALBUMIN  07/07/2018  . DEXA SCAN  Completed   Fall Risk  12/30/2016 11/03/2015  Falls in the past year? No No    Vitals:   09/26/17 1127  BP: 136/62  Pulse: 66  Resp: 20  Temp: 98.1 F (36.7 C)  SpO2: 96%  Weight: 153 lb 6.4 oz (69.6 kg)  Height: 5' 3"  (1.6 m)   Body mass index is 27.17 kg/m. Physical Exam  Constitutional: She is oriented to person, place, and time.  Elderly in no acute distress   HENT:  Head: Normocephalic.  Right Ear: External ear normal.  Left Ear: External ear normal.  Mouth/Throat: Oropharynx is clear and moist. No oropharyngeal exudate.  Eyes: Pupils are equal, round, and reactive to light. Conjunctivae and EOM are normal. Right eye exhibits no discharge. Left eye exhibits no discharge. No scleral icterus.  Eye glasses in place   Neck: Normal range of motion. No JVD present. No thyromegaly present.  Cardiovascular: Normal rate, regular rhythm, normal heart sounds and intact distal pulses. Exam reveals no gallop and no friction rub.  No murmur  heard. Pulmonary/Chest: Effort normal and breath sounds normal. No respiratory distress. She has no wheezes. She has no rales.  Abdominal: Soft. Bowel sounds are normal. She exhibits no distension and no mass. There is no tenderness. There is no rebound and no guarding.  Musculoskeletal:  Moves x 4 extremities except ROM limited to right hip due to pain.Arthritics changes to fingers.Bilateral lower extremities edema has improved with knee high ted hose.   Lymphadenopathy:    She has no cervical adenopathy.  Neurological: She is oriented to person, place, and time. Gait abnormal.  Skin: Skin is warm and dry. No rash noted. No erythema. No pallor.  Chronic brownish skin color to legs.  Psychiatric: She has a normal mood and affect. Her behavior is normal. Thought content normal.   Labs reviewed: Recent Labs    02/06/17 04/10/17 07/07/17  NA 142 141  141 142  K 4.0 4.1  4.1 4.2  CL 104  --   --   CO2 29  --   --   BUN 27* 30* 34*  CREATININE 1.19 1.1  1.11 1.14  CALCIUM 9.0 9.2 9.3   Recent Labs    04/10/17 07/07/17  AST 21  21 21   ALT 12  12 10   ALKPHOS 47  47 48  BILITOT 0.6 0.5  PROT 5.8 5.9  ALBUMIN 3.4 3.6   Recent Labs    12/16/16 04/10/17 07/07/17  WBC 8.4 6.2  6.2 5.6  HGB 12.4 11.9*  11.9 12.5  HCT 39 38  37.7 37.4  PLT 255 202  --    Lab Results  Component Value Date   TSH 1.84 01/16/2017   Lab Results  Component Value Date   HGBA1C 5.7 03/13/2017   Lab Results  Component Value Date   CHOL 211 (A) 06/16/2017   HDL 74 (A) 06/16/2017   LDLCALC 117 06/16/2017   TRIG 96 06/16/2017    Significant Diagnostic Results in last 30 days:  No results found.  Assessment/Plan 1. Osteopenia,  DEXA scan 08/27/2017 showed t-score -1.1 vitamin D and calcium supplements already initiated.continue on MVI and balanced diet.continue Fall and safety precautions.   2. Chronic diastolic congestive heart failure  Stable.No lung wheezes,rales or shortness of  breath.lower extremities edema has improved.will continue on Furosemide 20 mg tablet daily and knee high Ted hose.will reduce Furosemide to 10 mg tablet if sypstoms remain stable.    3. 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 Check CMP and CBC 09/29/2017.  4. Osteoarthritis of spine with radiculopathy, lumbar region Pain worst in the morning but improves as the day goes.continue on current regimen and monitor.   5. PVD (peripheral vascular disease) Chronic stasis skin changes.No ulceration.Edema has improved.continue on Furosemide and Ted hose.continue to monitor.    Family/ staff Communication: Reviewed plan of care with patient and facility Nurse.   Labs/tests ordered: CBC,CMP 09/29/2017.   Sandrea Hughs, NP

## 2017-09-29 ENCOUNTER — Encounter: Payer: Self-pay | Admitting: *Deleted

## 2017-09-29 DIAGNOSIS — I130 Hypertensive heart and chronic kidney disease with heart failure and stage 1 through stage 4 chronic kidney disease, or unspecified chronic kidney disease: Secondary | ICD-10-CM | POA: Diagnosis not present

## 2017-09-29 DIAGNOSIS — I5032 Chronic diastolic (congestive) heart failure: Secondary | ICD-10-CM | POA: Diagnosis not present

## 2017-09-29 LAB — CBC
HCT: 37.1
HEMOGLOBIN: 12.1
WBC: 5.1
platelet count: 191

## 2017-09-29 LAB — COMPLETE METABOLIC PANEL WITHOUT GFR
ALT: 9
AST: 19
Albumin: 3.8
Alkaline Phosphatase: 46
BUN: 44 — AB (ref 4–21)
Calcium: 9.3
Creat: 1.32
EGFR (Non-African Amer.): 35
Glucose: 72
Potassium: 4.5
Protein: 6.1
Sodium: 144
Total Bilirubin: 0.5

## 2017-09-29 LAB — CBC AND DIFFERENTIAL
HCT: 37 (ref 36–46)
Hemoglobin: 12.1 (ref 12.0–16.0)
PLATELETS: 191 (ref 150–399)
WBC: 5.1

## 2017-09-29 LAB — HEPATIC FUNCTION PANEL
ALT: 9 (ref 7–35)
AST: 19 (ref 13–35)
Alkaline Phosphatase: 46 (ref 25–125)
BILIRUBIN, TOTAL: 0.5

## 2017-09-29 LAB — BASIC METABOLIC PANEL WITH GFR
Creatinine: 1.3 — AB (ref 0.5–1.1)
Glucose: 72
Potassium: 4.5 (ref 3.4–5.3)
Sodium: 144 (ref 137–147)

## 2017-10-17 DIAGNOSIS — L602 Onychogryphosis: Secondary | ICD-10-CM | POA: Diagnosis not present

## 2017-10-17 DIAGNOSIS — M79671 Pain in right foot: Secondary | ICD-10-CM | POA: Diagnosis not present

## 2017-10-17 DIAGNOSIS — M79672 Pain in left foot: Secondary | ICD-10-CM | POA: Diagnosis not present

## 2017-11-12 ENCOUNTER — Other Ambulatory Visit: Payer: Self-pay

## 2017-11-12 MED ORDER — HYDROCODONE-ACETAMINOPHEN 5-325 MG PO TABS: 1.0000 | ORAL_TABLET | Freq: Three times a day (TID) | ORAL | 0 refills | Status: DC

## 2017-12-09 ENCOUNTER — Encounter: Payer: Self-pay | Admitting: Family

## 2017-12-09 ENCOUNTER — Non-Acute Institutional Stay: Payer: Medicare Other | Admitting: Family

## 2017-12-09 DIAGNOSIS — B372 Candidiasis of skin and nail: Secondary | ICD-10-CM | POA: Diagnosis not present

## 2017-12-09 DIAGNOSIS — R21 Rash and other nonspecific skin eruption: Secondary | ICD-10-CM | POA: Diagnosis not present

## 2017-12-09 NOTE — Progress Notes (Signed)
Location:  [place] Room Number: 23 Place of Service:  ALF [phone]) Provider: Sherron Mummert FNP-C  Blanchie Serve, MD  Patient Care Team: Blanchie Serve, MD as PCP - General (Internal Medicine) Rutherford Guys, MD as Consulting Physician (Ophthalmology) Roshunda Keir, Nelda Bucks, NP as Nurse Practitioner [facility])  Extended Emergency Contact Information Primary Emergency Contact: Poynor,Leonard Address: [place] [address] Johnnette Litter of [place] Phone: [phone] Work Phone: [phone] Mobile Phone: [phone] Relation: Son Secondary Emergency Contact: [place] of [place] Phone: [phone] Relation: Friend  Code Status:  DNR Goals of care: Advanced Directive information Advanced Directives 12/09/2017  Does Patient Have a Medical Advance Directive? Yes  Type of Paramedic of [place];Out of facility DNR (pink MOST or yellow form);Living will  Does patient want to make changes to medical advance directive? -  Copy of [place] in Chart? Yes  Would patient like information on creating a medical advance directive? -  Pre-existing out of facility DNR order (yellow form or pink MOST form) Yellow form placed in chart (order not valid for inpatient use);Pink MOST form placed in chart (order not valid for inpatient use)     Chief Complaint  Patient presents with  . Acute Visit    rash on legs    HPI:  Pt is a 84 y.o. female seen today at [facility] for an acute visit for evaluation of rash on the thighs.she is seen in her room today with facility Nurse supervisor present at bedside.she complains of itchy rash on her thighs and right top of the foot.she states had itching on her hands and back few days ago but did not notice any rash.However,this morning as she was being assisted by the CNA she noticed red areas on her thighs.Rash does not  keep her awake at night.she denies any change in soap,lotion or detergent.she also denies eating out.she denies any fever,chills,sore throat,cough,shortness of breath or wheezing.she has had no OTC or newly prescribed medication.       Past Medical History:  Diagnosis Date  . Acute blood loss anemia   . Allergy   . Asthma   . AVM (arteriovenous malformation) of colon with hemorrhage   . Bursitis of right hip   . Cataract   . CKD (chronic kidney disease), stage III ([place]) 11/04/2015  . COLONIC POLYPS 03/26/2005   Qualifier: Diagnosis of  By: Talbert Cage CMA ([place]), June    . DIVERTICULOSIS, COLON 03/26/2005   Qualifier: Diagnosis of  By: Talbert Cage CMA ([place]), June    . DVT (deep venous thrombosis), left 11/03/2015  . Ecchymosis 12/16/2015  . Hyperglycemia 12/16/2015  . Hyperlipidemia   . Hypertension   . Multiple skin tears 12/16/2015  . Osteopenia   . Stasis edema of both lower extremities 11/04/2015  . Unstable gait 12/16/2015  . Urine incontinence 12/16/2015   Past Surgical History:  Procedure Laterality Date  . ABDOMINAL HYSTERECTOMY  2006   Dr. Maryelizabeth Rowan  . CATARACT EXTRACTION W/ INTRAOCULAR LENS IMPLANT Bilateral 1999/2003   Dr. Rutherford Guys  . COLONOSCOPY N/A 11/06/2015   Procedure: COLONOSCOPY;  Surgeon: Mauri Pole, MD;  Location: [facility];  Service: Endoscopy;  Laterality: N/A;  MAC if available, otherwise moderate sedation    Allergies  Allergen Reactions  . Epinephrine Nausea Only    Unknown.  Marland Kitchen Hydrocodone-Acetaminophen     REACTION: nausea  .  Labetalol   . Procaine     Outpatient Encounter Medications as of 12/09/2017  Medication Sig  . acetaminophen (TYLENOL) 500 MG tablet Take 500 mg by mouth 2 (two) times daily.   . calcium carbonate (TUMS - DOSED IN MG ELEMENTAL CALCIUM) 500 MG chewable tablet Chew 2 tablets by mouth daily.   . cholecalciferol (VITAMIN D) 1000 units tablet Take 1,000 Units by mouth daily.  . furosemide (LASIX) 20 MG tablet Take 1 tablet (20  mg total) by mouth daily.  Marland Kitchen gabapentin (NEURONTIN) 100 MG capsule Take 300 mg by mouth 2 (two) times daily.   Marland Kitchen HYDROcodone-acetaminophen (NORCO/VICODIN) 5-325 MG tablet Take 1 tablet by mouth 3 (three) times daily.  . Lidocaine 4 % PTCH Apply 1 application topically every 12 (twelve) hours. Right hip  . loperamide (IMODIUM A-D) 2 MG tablet Take 2 mg by mouth as needed for diarrhea or loose stools.  . meloxicam (MOBIC) 7.5 MG tablet Take 7.5 mg by mouth daily.  . Multiple Vitamins-Minerals (ICAPS MV PO) Take 1 tablet by mouth daily.  . Multiple Vitamins-Minerals (MULTIVITAMIN WITH MINERALS) tablet Take 1 tablet by mouth daily.    No facility-administered encounter medications on file as of 12/09/2017.     Review of Systems  Constitutional: Negative for activity change, chills, fatigue, fever and unexpected weight change.  HENT: Negative for congestion, rhinorrhea, sinus pressure, sinus pain, sneezing and sore throat.   Eyes: Negative for pain, discharge, redness and itching.       Wears eye glasses  Respiratory: Negative for cough, chest tightness, shortness of breath and wheezing.   Cardiovascular: Negative for chest pain, palpitations and leg swelling.  Gastrointestinal: Negative for abdominal distention, abdominal pain, constipation, diarrhea, nausea and vomiting.  Musculoskeletal: Positive for arthralgias and gait problem.  Skin: Positive for rash. Negative for color change, pallor and wound.  Neurological: Negative for dizziness, weakness, light-headedness and headaches.  Psychiatric/Behavioral: Negative for agitation, confusion and sleep disturbance. The patient is not nervous/anxious.     Immunization History  Administered Date(s) Administered  . DTaP 05/13/2005  . Influenza-Unspecified 02/11/2015, 02/22/2016, 02/19/2017  . PPD Test 12/08/2015, 12/22/2015  . Pneumococcal Conjugate-13 05/13/2014  . Pneumococcal Polysaccharide-23 05/13/2014  . Td 12/11/2005  . Zoster 05/13/2014    Pertinent  Health Maintenance Due  Topic Date Due  . FOOT EXAM  09/12/1935  . PNA vac Low Risk Adult (2 of 2 - PPSV23) 05/14/2015  . HEMOGLOBIN A1C  09/10/2017  . INFLUENZA VACCINE  12/11/2017  . OPHTHALMOLOGY EXAM  04/10/2018  . URINE MICROALBUMIN  07/07/2018  . DEXA SCAN  Completed   Fall Risk  12/30/2016 11/03/2015  Falls in the past year? No No   Functional Status Survey:    Vitals:   12/09/17 1120  BP: 111/64  Pulse: 89  Resp: 20  Temp: 98.4 F (36.9 C)  SpO2: 96%  Weight: 154 lb (69.9 kg)  Height: 5' 3"  (1.6 m)   Body mass index is 27.28 kg/m. Physical Exam  Constitutional: She is oriented to person, place, and time. She appears well-developed and well-nourished.  Elderly in no acute distress   HENT:  Head: Normocephalic.  Right Ear: External ear normal.  Left Ear: External ear normal.  Mouth/Throat: Oropharynx is clear and moist. No oropharyngeal exudate.  Eyes: Pupils are equal, round, and reactive to light. Conjunctivae and EOM are normal. Right eye exhibits no discharge. Left eye exhibits no discharge. No scleral icterus.  Neck: Normal range of motion. No JVD present.  No thyromegaly present.  Cardiovascular: Normal rate, regular rhythm, normal heart sounds and intact distal pulses. Exam reveals no gallop and no friction rub.  No murmur heard. Pulmonary/Chest: Effort normal. No respiratory distress. She has no wheezes. She has no rales.  Abdominal: Soft. Bowel sounds are normal. She exhibits no distension and no mass. There is no tenderness. There is no rebound and no guarding.  Musculoskeletal: She exhibits no edema or tenderness.  Moves x 4 extremities.unsteady gait ambulates with foldable walker.Athritic changes to fingers noted.  Lymphadenopathy:    She has no cervical adenopathy.  Neurological: She is oriented to person, place, and time. Gait abnormal.  Skin: Skin is warm and dry. No pallor.  1. Erythematous non raised rash with majority covering the  thighs and lower back and abdomen.Few areas affected on the right top of the foot and wrist.No vesicles or drainage noted.Face and upper trunk area spared.   2. Beefy redness to umbilicus areas.No drainage or strong odor noted.   Psychiatric: She has a normal mood and affect. Her behavior is normal. Judgment and thought content normal.  Nursing note and vitals reviewed.   Labs reviewed: Recent Labs    02/06/17 04/10/17 07/07/17 09/29/17  NA 142 141  141 142 144  K 4.0 4.1  4.1 4.2 4.5  CL 104  --   --   --   CO2 29  --   --   --   BUN 27* 30* 34* 44*  CREATININE 1.19 1.1  1.11 1.14 1.32  CALCIUM 9.0 9.2 9.3 9.3   Recent Labs    04/10/17 07/07/17 09/29/17  AST 21  21 21 19   ALT 12  12 10 9   ALKPHOS 47  47 48 46  BILITOT 0.6 0.5 0.5  PROT 5.8 5.9  --   ALBUMIN 3.4 3.6 3.8   Recent Labs    12/16/16 04/10/17 07/07/17 09/29/17  WBC 8.4 6.2  6.2 5.6 5.1  HGB 12.4 11.9*  11.9 12.5 12.1  HCT 39 38  37.7 37.4 37.1  PLT 255 202  --   --    Lab Results  Component Value Date   TSH 1.84 01/16/2017   Lab Results  Component Value Date   HGBA1C 5.7 03/13/2017   Lab Results  Component Value Date   CHOL 211 (A) 06/16/2017   HDL 74 (A) 06/16/2017   LDLCALC 117 06/16/2017   TRIG 96 06/16/2017    Significant Diagnostic Results in last 30 days:  No results found.  Assessment/Plan 1. Rash and nonspecific skin eruption Afebrile.Erythematous non raised rash with majority covering the thighs and lower back and abdomen.Few areas affected on the right top of the foot and wrist.No vesicles or drainage noted.Face and upper trunk area spared.Unclear etiology suspect allergic reaction but doubt if insect bite.Medications reviewed no new OTC or prescription medication.start on Zrytec 10 mg tablet one by mouth daily for itching.continue to monitor if worsening will initiate a short course of tapered Prednisone.continue to monitor.      2. Candidiasis of skin Beefy skin redness to  umbilicus area.No drainage or odor noted. Start on Nystatin 100,000 units cream apply to affected areas until resolved.Keep area clean and dry.continue to monitor.  Family/ staff Communication: Reviewed plan of care with patient and facility Nurse supervisor  Labs/tests ordered: None   Sandrea Hughs, NP

## 2017-12-24 ENCOUNTER — Encounter: Payer: Self-pay | Admitting: Internal Medicine

## 2017-12-31 ENCOUNTER — Encounter: Payer: Self-pay | Admitting: Internal Medicine

## 2017-12-31 NOTE — Progress Notes (Signed)
Opened in error

## 2017-12-31 NOTE — Patient Instructions (Signed)
Opened in error

## 2018-01-05 ENCOUNTER — Encounter: Payer: Self-pay | Admitting: Internal Medicine

## 2018-01-05 ENCOUNTER — Non-Acute Institutional Stay: Payer: Medicare Other

## 2018-01-05 ENCOUNTER — Non-Acute Institutional Stay: Payer: Medicare Other | Admitting: Internal Medicine

## 2018-01-05 DIAGNOSIS — M85852 Other specified disorders of bone density and structure, left thigh: Secondary | ICD-10-CM

## 2018-01-05 DIAGNOSIS — Z0000 Encounter for general adult medical examination without abnormal findings: Secondary | ICD-10-CM

## 2018-01-05 DIAGNOSIS — I739 Peripheral vascular disease, unspecified: Secondary | ICD-10-CM | POA: Diagnosis not present

## 2018-01-05 DIAGNOSIS — I5032 Chronic diastolic (congestive) heart failure: Secondary | ICD-10-CM | POA: Diagnosis not present

## 2018-01-05 DIAGNOSIS — R2681 Unsteadiness on feet: Secondary | ICD-10-CM

## 2018-01-05 DIAGNOSIS — N183 Chronic kidney disease, stage 3 unspecified: Secondary | ICD-10-CM

## 2018-01-05 DIAGNOSIS — M4726 Other spondylosis with radiculopathy, lumbar region: Secondary | ICD-10-CM

## 2018-01-05 NOTE — Patient Instructions (Signed)
Yolanda [name] , Thank you for taking time to come for your Medicare Wellness Visit. I appreciate your ongoing commitment to your health goals. Please review the following plan we discussed and let me know if I can assist you in the future.   Screening recommendations/referrals: Colonoscopy excluded, over age 82 Mammogram excluded, over age 4 Bone Density up to date Recommended yearly ophthalmology/optometry visit for glaucoma screening and checkup Recommended yearly dental visit for hygiene and checkup  Vaccinations: Influenza vaccine due, will get at [facility] Pneumococcal vaccine up to date, completed Tdap vaccine up to date, due august 2028 Shingles vaccine not in past records    Advanced directives: in chart  Conditions/risks identified: none  Next appointment: Dr Bubba Camp makes rounds   Preventive Care 37 Years and Older, Female Preventive care refers to lifestyle choices and visits with your health care provider that can promote health and wellness. What does preventive care include?  A yearly physical exam. This is also called an annual well check.  Dental exams once or twice a year.  Routine eye exams. Ask your health care provider how often you should have your eyes checked.  Personal lifestyle choices, including:  Daily care of your teeth and gums.  Regular physical activity.  Eating a healthy diet.  Avoiding tobacco and drug use.  Limiting alcohol use.  Practicing safe sex.  Taking low-dose aspirin every day.  Taking vitamin and mineral supplements as recommended by your health care provider. What happens during an annual well check? The services and screenings done by your health care provider during your annual well check will depend on your age, overall health, lifestyle risk factors, and family history of disease. Counseling  Your health care provider may ask you questions about your:  Alcohol use.  Tobacco use.  Drug use.  Emotional  well-being.  Home and relationship well-being.  Sexual activity.  Eating habits.  History of falls.  Memory and ability to understand (cognition).  Work and work Statistician.  Reproductive health. Screening  You may have the following tests or measurements:  Height, weight, and BMI.  Blood pressure.  Lipid and cholesterol levels. These may be checked every 5 years, or more frequently if you are over 32 years old.  Skin check.  Lung cancer screening. You may have this screening every year starting at age 33 if you have a 30-pack-year history of smoking and currently smoke or have quit within the past 15 years.  Fecal occult blood test (FOBT) of the stool. You may have this test every year starting at age 4.  Flexible sigmoidoscopy or colonoscopy. You may have a sigmoidoscopy every 5 years or a colonoscopy every 10 years starting at age 57.  Hepatitis C blood test.  Hepatitis B blood test.  Sexually transmitted disease (STD) testing.  Diabetes screening. This is done by checking your blood sugar (glucose) after you have not eaten for a while (fasting). You may have this done every 1-3 years.  Bone density scan. This is done to screen for osteoporosis. You may have this done starting at age 46.  Mammogram. This may be done every 1-2 years. Talk to your health care provider about how often you should have regular mammograms. Talk with your health care provider about your test results, treatment options, and if necessary, the need for more tests. Vaccines  Your health care provider may recommend certain vaccines, such as:  Influenza vaccine. This is recommended every year.  Tetanus, diphtheria, and acellular pertussis (Tdap, Td)  vaccine. You may need a Td booster every 10 years.  Zoster vaccine. You may need this after age 42.  Pneumococcal 13-valent conjugate (PCV13) vaccine. One dose is recommended after age 4.  Pneumococcal polysaccharide (PPSV23) vaccine. One  dose is recommended after age 2. Talk to your health care provider about which screenings and vaccines you need and how often you need them. This information is not intended to replace advice given to you by your health care provider. Make sure you discuss any questions you have with your health care provider. Document Released: 05/26/2015 Document Revised: 01/17/2016 Document Reviewed: 02/28/2015 Elsevier Interactive Patient Education  2017 [place] Prevention in the Home Falls can cause injuries. They can happen to people of all ages. There are many things you can do to make your home safe and to help prevent falls. What can I do on the outside of my home?  Regularly fix the edges of walkways and driveways and fix any cracks.  Remove anything that might make you trip as you walk through a door, such as a raised step or threshold.  Trim any bushes or trees on the path to your home.  Use bright outdoor lighting.  Clear any walking paths of anything that might make someone trip, such as rocks or tools.  Regularly check to see if handrails are loose or broken. Make sure that both sides of any steps have handrails.  Any raised decks and porches should have guardrails on the edges.  Have any leaves, snow, or ice cleared regularly.  Use sand or salt on walking paths during winter.  Clean up any spills in your garage right away. This includes oil or grease spills. What can I do in the bathroom?  Use night lights.  Install grab bars by the toilet and in the tub and shower. Do not use towel bars as grab bars.  Use non-skid mats or decals in the tub or shower.  If you need to sit down in the shower, use a plastic, non-slip stool.  Keep the floor dry. Clean up any water that spills on the floor as soon as it happens.  Remove soap buildup in the tub or shower regularly.  Attach bath mats securely with double-sided non-slip rug tape.  Do not have throw rugs and other  things on the floor that can make you trip. What can I do in the bedroom?  Use night lights.  Make sure that you have a light by your bed that is easy to reach.  Do not use any sheets or blankets that are too big for your bed. They should not hang down onto the floor.  Have a firm chair that has side arms. You can use this for support while you get dressed.  Do not have throw rugs and other things on the floor that can make you trip. What can I do in the kitchen?  Clean up any spills right away.  Avoid walking on wet floors.  Keep items that you use a lot in easy-to-reach places.  If you need to reach something above you, use a strong step stool that has a grab bar.  Keep electrical cords out of the way.  Do not use floor polish or wax that makes floors slippery. If you must use wax, use non-skid floor wax.  Do not have throw rugs and other things on the floor that can make you trip. What can I do with my stairs?  Do not leave  any items on the stairs.  Make sure that there are handrails on both sides of the stairs and use them. Fix handrails that are broken or loose. Make sure that handrails are as long as the stairways.  Check any carpeting to make sure that it is firmly attached to the stairs. Fix any carpet that is loose or worn.  Avoid having throw rugs at the top or bottom of the stairs. If you do have throw rugs, attach them to the floor with carpet tape.  Make sure that you have a light switch at the top of the stairs and the bottom of the stairs. If you do not have them, ask someone to add them for you. What else can I do to help prevent falls?  Wear shoes that:  Do not have high heels.  Have rubber bottoms.  Are comfortable and fit you well.  Are closed at the toe. Do not wear sandals.  If you use a stepladder:  Make sure that it is fully opened. Do not climb a closed stepladder.  Make sure that both sides of the stepladder are locked into place.  Ask  someone to hold it for you, if possible.  Clearly mark and make sure that you can see:  Any grab bars or handrails.  First and last steps.  Where the edge of each step is.  Use tools that help you move around (mobility aids) if they are needed. These include:  Canes.  Walkers.  Scooters.  Crutches.  Turn on the lights when you go into a dark area. Replace any light bulbs as soon as they burn out.  Set up your furniture so you have a clear path. Avoid moving your furniture around.  If any of your floors are uneven, fix them.  If there are any pets around you, be aware of where they are.  Review your medicines with your doctor. Some medicines can make you feel dizzy. This can increase your chance of falling. Ask your doctor what other things that you can do to help prevent falls. This information is not intended to replace advice given to you by your health care provider. Make sure you discuss any questions you have with your health care provider. Document Released: 02/23/2009 Document Revised: 10/05/2015 Document Reviewed: 06/03/2014 Elsevier Interactive Patient Education  2017 [employer].

## 2018-01-05 NOTE — Progress Notes (Signed)
Subjective:   Yolanda [name] is a 84 y.o. female who presents for Medicare Annual (Subsequent) preventive examination at [place] [facility]-12/30/2016  Objective:     Vitals: BP 120/76 (BP Location: Left Arm, Patient Position: Sitting)   Pulse 65   Temp 98.3 F (36.8 C) (Oral)   Ht 5' 3"  (1.6 m)   Wt 156 lb (70.8 kg)   BMI 27.63 kg/m   Body mass index is 27.63 kg/m.  Advanced Directives 01/05/2018 01/05/2018 12/31/2017 12/09/2017 09/26/2017 07/04/2017 03/11/2017  Does Patient Have a Medical Advance Directive? Yes Yes Yes Yes Yes Yes Yes  Type of Paramedic of [place];Out of facility DNR (pink MOST or yellow form);Living will [place];Out of facility DNR (pink MOST or yellow form);Living will [place];Living will;Out of facility DNR (pink MOST or yellow form) [place];Out of facility DNR (pink MOST or yellow form);Living will [place];Out of facility DNR (pink MOST or yellow form);Living will [place];Living will;Out of facility DNR (pink MOST or yellow form) Out of facility DNR (pink MOST or yellow form);Living will;Healthcare Power of Attorney  Does patient want to make changes to medical advance directive? No - Patient declined - No - Patient declined - - No - Patient declined -  Copy of [place] in Chart? Yes Yes Yes Yes Yes Yes Yes  Would patient like information on creating a medical advance directive? - - - - - - -  Pre-existing out of facility DNR order (yellow form or pink MOST form) Yellow form placed in chart (order not valid for inpatient use);Pink MOST form placed in chart (order not valid for inpatient use) Yellow form placed in chart (order not valid for inpatient use);Pink MOST form placed in chart (order not valid for inpatient use) Yellow form placed in chart (order not valid for inpatient use);Pink MOST  form placed in chart (order not valid for inpatient use) Yellow form placed in chart (order not valid for inpatient use);Pink MOST form placed in chart (order not valid for inpatient use) Yellow form placed in chart (order not valid for inpatient use);Pink MOST form placed in chart (order not valid for inpatient use) Yellow form placed in chart (order not valid for inpatient use);Pink MOST form placed in chart (order not valid for inpatient use) Yellow form placed in chart (order not valid for inpatient use);Pink MOST form placed in chart (order not valid for inpatient use)    Tobacco Social History   Tobacco Use  Smoking Status Never Smoker  Smokeless Tobacco Never Used     Counseling given: Not Answered   Clinical Intake:  Pre-visit preparation completed: No  Pain : No/denies pain     Nutritional Risks: None Diabetes: No  How often do you need to have someone help you when you read instructions, pamphlets, or other written materials from your doctor or pharmacy?: 1 - Never  Interpreter Needed?: No  Information entered by :: Tyson Dense, RN  Past Medical History:  Diagnosis Date  . Acute blood loss anemia   . Allergy   . Asthma   . AVM (arteriovenous malformation) of colon with hemorrhage   . Bursitis of right hip   . Cataract   . CKD (chronic kidney disease), stage III ([place]) 11/04/2015  . COLONIC POLYPS 03/26/2005   Qualifier: Diagnosis of  By: Talbert Cage CMA ([place]), June    . DIVERTICULOSIS,  COLON 03/26/2005   Qualifier: Diagnosis of  By: Talbert Cage CMA ([place]), June    . DVT (deep venous thrombosis), left 11/03/2015  . Ecchymosis 12/16/2015  . Hyperglycemia 12/16/2015  . Hyperlipidemia   . Hypertension   . Multiple skin tears 12/16/2015  . Osteopenia   . Stasis edema of both lower extremities 11/04/2015  . Unstable gait 12/16/2015  . Urine incontinence 12/16/2015   Past Surgical History:  Procedure Laterality Date  . ABDOMINAL HYSTERECTOMY  2006   Dr. Maryelizabeth Rowan  .  CATARACT EXTRACTION W/ INTRAOCULAR LENS IMPLANT Bilateral 1999/2003   Dr. Rutherford Guys  . COLONOSCOPY N/A 11/06/2015   Procedure: COLONOSCOPY;  Surgeon: Mauri Pole, MD;  Location: [facility];  Service: Endoscopy;  Laterality: N/A;  MAC if available, otherwise moderate sedation   Family History  Problem Relation Age of Onset  . Heart attack Father 108   Social History   Socioeconomic History  . Marital status: Widowed    Spouse name: Not on file  . Number of children: 2  . Years of education: Not on file  . Highest education level: Not on file  Occupational History  . Occupation: retired Art gallery manager  Social Needs  . Financial resource strain: Not hard at all  . Food insecurity:    Worry: Never true    Inability: Never true  . Transportation needs:    Medical: No    Non-medical: No  Tobacco Use  . Smoking status: Never Smoker  . Smokeless tobacco: Never Used  Substance and Sexual Activity  . Alcohol use: No    Alcohol/week: 0.0 standard drinks  . Drug use: No  . Sexual activity: Never    Comment: was until a few years ago  Lifestyle  . Physical activity:    Days per week: 7 days    Minutes per session: 30 min  . Stress: Only a little  Relationships  . Social connections:    Talks on phone: More than three times a week    Gets together: More than three times a week    Attends religious service: Never    Active member of club or organization: No    Attends meetings of clubs or organizations: Never    Relationship status: Widowed  Other Topics Concern  . Not on file  Social History Narrative   [place]ives alone in an retirement community. [place]   Walks with a walker.   NOK: 2 sons with shared POA (they live far away) - Shanon Brow Dintinfass would be first call.   Never smoked   Alcohol none   Exercise none   POA, Living Will, MOST              Outpatient Encounter Medications as of 01/05/2018  Medication Sig  . acetaminophen (TYLENOL) 500 MG  tablet Take 500 mg by mouth 2 (two) times daily.   . calcium carbonate (TUMS - DOSED IN MG ELEMENTAL CALCIUM) 500 MG chewable tablet Chew 2 tablets by mouth daily.   . cholecalciferol (VITAMIN D) 1000 units tablet Take 1,000 Units by mouth daily.  . furosemide (LASIX) 20 MG tablet Take 1 tablet (20 mg total) by mouth daily.  Marland Kitchen gabapentin (NEURONTIN) 100 MG capsule Take 300 mg by mouth 2 (two) times daily.   Marland Kitchen HYDROcodone-acetaminophen (NORCO/VICODIN) 5-325 MG tablet Take 1 tablet by mouth 3 (three) times daily.  . Lidocaine 4 % PTCH Apply 1 application topically every 12 (twelve) hours. Right hip  . loperamide (  IMODIUM A-D) 2 MG tablet Take 2 mg by mouth as needed for diarrhea or loose stools.  . meloxicam (MOBIC) 7.5 MG tablet Take 7.5 mg by mouth daily.  . Multiple Vitamins-Minerals (ICAPS MV PO) Take 1 tablet by mouth daily.  . Multiple Vitamins-Minerals (MULTIVITAMIN WITH MINERALS) tablet Take 1 tablet by mouth daily.   Marland Kitchen nystatin (NYSTATIN) powder Apply topically 2 (two) times daily as needed.   No facility-administered encounter medications on file as of 01/05/2018.     Activities of Daily Living In your present state of health, do you have any difficulty performing the following activities: 01/05/2018  Hearing? N  Vision? N  Difficulty concentrating or making decisions? N  Walking or climbing stairs? Y  Dressing or bathing? N  Doing errands, shopping? Y  Preparing Food and eating ? Y  Using the Toilet? N  In the past six months, have you accidently leaked urine? N  Do you have problems with loss of bowel control? N  Managing your Medications? Y  Managing your Finances? Y  Housekeeping or managing your Housekeeping? Y  Some recent data might be hidden    Patient Care Team: Blanchie Serve, MD as PCP - General (Internal Medicine) Rutherford Guys, MD as Consulting Physician (Ophthalmology) Ngetich, Nelda Bucks, NP as Nurse Practitioner (Family Medicine)    Assessment:   This is a  routine wellness examination for [facility].  Exercise Activities and Dietary recommendations Current Exercise Habits: Home exercise routine, Type of exercise: walking, Time (Minutes): 30, Frequency (Times/Week): 7, Weekly Exercise (Minutes/Week): 210, Intensity: Mild, Exercise limited by: orthopedic condition(s)  Goals   None     Fall Risk Fall Risk  01/05/2018 12/30/2016 11/03/2015  Falls in the past year? No No No   Is the patient's home free of loose throw rugs in walkways, pet beds, electrical cords, etc?   yes      Grab bars in the bathroom? yes      Handrails on the stairs?   yes      Adequate lighting?   yes  Depression Screen PHQ 2/9 Scores 01/05/2018 12/30/2016 11/03/2015  PHQ - 2 Score 0 0 0     Cognitive Function MMSE - Mini Mental State Exam 01/05/2018 12/30/2016  Orientation to time 5 5  Orientation to Place 5 5  Registration 3 3  Attention/ Calculation 5 5  Recall 3 3  Language- name 2 objects 2 2  Language- repeat 1 1  Language- follow 3 step command 3 3  Language- read & follow direction 1 1  Write a sentence 1 1  Copy design 1 1  Total score 30 30        Immunization History  Administered Date(s) Administered  . DTaP 05/13/2005  . Influenza-Unspecified 02/11/2015, 02/22/2016, 02/19/2017  . PPD Test 12/08/2015, 12/22/2015  . Pneumococcal Conjugate-13 05/13/2014  . Pneumococcal Polysaccharide-23 05/13/2014  . Td 12/11/2005  . Tdap 01/04/2017  . Zoster 05/13/2014    Qualifies for Shingles Vaccine? Not in past records  Screening Tests Health Maintenance  Topic Date Due  . FOOT EXAM  09/12/1935  . PNA vac Low Risk Adult (2 of 2 - PPSV23) 05/14/2015  . HEMOGLOBIN A1C  09/10/2017  . INFLUENZA VACCINE  12/11/2017  . OPHTHALMOLOGY EXAM  04/10/2018  . URINE MICROALBUMIN  07/07/2018  . TETANUS/TDAP  01/05/2027  . DEXA SCAN  Completed    Cancer Screenings: Lung: Low Dose CT Chest recommended if Age 69-80 years, 30 pack-year currently smoking OR have  quit w/in 15years. Patient does not qualify. Breast:  Up to date on Mammogram? Yes   Up to date of Bone Density/Dexa? Yes Colorectal: up to date  Additional Screenings:  Hepatitis C Screening: declined     Plan:    I have personally reviewed and addressed the Medicare Annual Wellness questionnaire and have noted the following in the patient's chart:  A. Medical and social history B. Use of alcohol, tobacco or illicit drugs  C. Current medications and supplements D. Functional ability and status E.  Nutritional status F.  Physical activity G. Advance directives H. List of other physicians I.  Hospitalizations, surgeries, and ER visits in previous 12 months J.  [place] to include hearing, vision, cognitive, depression L. Referrals and appointments - none  In addition, I have reviewed and discussed with patient certain preventive protocols, quality metrics, and best practice recommendations. A written personalized care plan for preventive services as well as general preventive health recommendations were provided to patient.  See attached scanned questionnaire for additional information.   Signed,   Tyson Dense, RN Nurse Health Advisor  Patient Concerns: None

## 2018-01-05 NOTE — Progress Notes (Signed)
Location:  [facility] Room Number: 23 Place of Service:  ALF [phone]) Provider:  Blanchie Serve MD  Blanchie Serve, MD  Patient Care Team: Blanchie Serve, MD as PCP - General (Internal Medicine) Rutherford Guys, MD as Consulting Physician (Ophthalmology) Ngetich, Nelda Bucks, NP as Nurse Practitioner [facility])  Extended Emergency Contact Information Primary Emergency Contact: Gunn,Leonard Address: [address] [place] [address] Johnnette Litter of [place] Phone: [phone] Work Phone: [phone] Mobile Phone: [phone] Relation: Son Secondary Emergency Contact: [place] of [place] Phone: [phone] Relation: Friend  Code Status:  DNR  Goals of care: Advanced Directive information Advanced Directives 01/05/2018  Does Patient Have a Medical Advance Directive? Yes  Type of Paramedic of [place];Out of facility DNR (pink MOST or yellow form);Living will  Does patient want to make changes to medical advance directive? No - Patient declined  Copy of [place] in Chart? Yes  Would patient like information on creating a medical advance directive? -  Pre-existing out of facility DNR order (yellow form or pink MOST form) Yellow form placed in chart (order not valid for inpatient use);Pink MOST form placed in chart (order not valid for inpatient use)     Chief Complaint  Patient presents with  . Medical Management of Chronic Issues    HPI:  Pt is a 84 y.o. female seen today for medical management of chronic diseases. She is seen in her room with charge nurse at bedside. She denies any concern. No acute concern from nursing.   Chronic diastolic CHF- taking lasix daily, tolerating it well, denies chest pain or dyspnea  PVD- takes lasix 20 mg daily, does not wear compression stockings.   OA- with radiculopathy, taking gabapenitn, norco 5-325 mg tid and  meloxicam 7.5 mg daily has been helpful. She is not sure if lidocaine is helping at all.   Osteopenia- of neck of left femur. Takes calcium and vitamin d. No fall reported.   CKD stage 3- denies urinary complaints.     Past Medical History:  Diagnosis Date  . Acute blood loss anemia   . Allergy   . Asthma   . AVM (arteriovenous malformation) of colon with hemorrhage   . Bursitis of right hip   . Cataract   . CKD (chronic kidney disease), stage III ([place]) 11/04/2015  . COLONIC POLYPS 03/26/2005   Qualifier: Diagnosis of  By: Talbert Cage CMA ([place]), June    . DIVERTICULOSIS, COLON 03/26/2005   Qualifier: Diagnosis of  By: Talbert Cage CMA ([place]), June    . DVT (deep venous thrombosis), left 11/03/2015  . Ecchymosis 12/16/2015  . Hyperglycemia 12/16/2015  . Hyperlipidemia   . Hypertension   . Multiple skin tears 12/16/2015  . Osteopenia   . Stasis edema of both lower extremities 11/04/2015  . Unstable gait 12/16/2015  . Urine incontinence 12/16/2015   Past Surgical History:  Procedure Laterality Date  . ABDOMINAL HYSTERECTOMY  2006   Dr. Maryelizabeth Rowan  . CATARACT EXTRACTION W/ INTRAOCULAR LENS IMPLANT Bilateral 1999/2003   Dr. Rutherford Guys  . COLONOSCOPY N/A 11/06/2015   Procedure: COLONOSCOPY;  Surgeon: Mauri Pole, MD;  [facility];  Service: Endoscopy;  Laterality: N/A;  MAC if available, otherwise moderate sedation    Allergies  Allergen Reactions  . Epinephrine Nausea Only    Unknown.  Marland Kitchen Hydrocodone-Acetaminophen     REACTION: nausea  .  Labetalol   . Procaine     Outpatient Encounter Medications as of 01/05/2018  Medication Sig  . acetaminophen (TYLENOL) 500 MG tablet Take 500 mg by mouth 2 (two) times daily.   . calcium carbonate (TUMS - DOSED IN MG ELEMENTAL CALCIUM) 500 MG chewable tablet Chew 2 tablets by mouth daily.   . cholecalciferol (VITAMIN D) 1000 units tablet Take 1,000 Units by mouth daily.  . furosemide (LASIX) 20 MG tablet Take 1 tablet (20 mg total)  by mouth daily.  Marland Kitchen gabapentin (NEURONTIN) 100 MG capsule Take 300 mg by mouth 2 (two) times daily.   Marland Kitchen HYDROcodone-acetaminophen (NORCO/VICODIN) 5-325 MG tablet Take 1 tablet by mouth 3 (three) times daily.  . Lidocaine 4 % PTCH Apply 1 application topically every 12 (twelve) hours. Right hip  . loperamide (IMODIUM A-D) 2 MG tablet Take 2 mg by mouth as needed for diarrhea or loose stools.  . meloxicam (MOBIC) 7.5 MG tablet Take 7.5 mg by mouth daily.  . Multiple Vitamins-Minerals (ICAPS MV PO) Take 1 tablet by mouth daily.  . Multiple Vitamins-Minerals (MULTIVITAMIN WITH MINERALS) tablet Take 1 tablet by mouth daily.   Marland Kitchen nystatin (NYSTATIN) powder Apply topically 2 (two) times daily as needed.   No facility-administered encounter medications on file as of 01/05/2018.     Review of Systems  Constitutional: Negative for appetite change, chills, fatigue and fever.  HENT: Negative for congestion, ear discharge, ear pain, mouth sores, postnasal drip, rhinorrhea, sinus pressure, sinus pain, sore throat and trouble swallowing.   Eyes: Positive for visual disturbance. Negative for pain and itching.  Respiratory: Negative for cough, choking, shortness of breath and wheezing.   Cardiovascular: Positive for leg swelling. Negative for chest pain and palpitations.  Gastrointestinal: Negative for abdominal pain, constipation, diarrhea, nausea and vomiting.  Genitourinary: Negative for dysuria, flank pain and frequency.  Musculoskeletal: Positive for arthralgias, back pain and gait problem. Negative for myalgias.  Skin: Negative for rash and wound.  Neurological: Negative for dizziness, tremors and headaches.  Psychiatric/Behavioral: Negative for behavioral problems, confusion, dysphoric mood, hallucinations and sleep disturbance. The patient is not nervous/anxious.     Immunization History  Administered Date(s) Administered  . DTaP 05/13/2005  . Influenza-Unspecified 02/11/2015, 02/22/2016,  02/19/2017  . PPD Test 12/08/2015, 12/22/2015  . Pneumococcal Conjugate-13 05/13/2014  . Pneumococcal Polysaccharide-23 05/13/2014  . Td 12/11/2005  . Tdap 01/04/2017  . Zoster 05/13/2014   Pertinent  Health Maintenance Due  Topic Date Due  . FOOT EXAM  09/12/1935  . PNA vac Low Risk Adult (2 of 2 - PPSV23) 05/14/2015  . HEMOGLOBIN A1C  09/10/2017  . INFLUENZA VACCINE  12/11/2017  . OPHTHALMOLOGY EXAM  04/10/2018  . URINE MICROALBUMIN  07/07/2018  . DEXA SCAN  Completed   Fall Risk  01/05/2018 12/30/2016 11/03/2015  Falls in the past year? No No No   Functional Status Survey:    Vitals:   01/05/18 1039  BP: 116/74  Pulse: 60  Resp: 20  Temp: 97.7 F (36.5 C)  SpO2: 96%  Weight: 154 lb (69.9 kg)  Height: _0  (1.6 m)   Body mass index is 27.28 kg/m.   Wt Readings from Last 3 Encounters:  01/05/18 156 lb (70.8 kg)  01/05/18 154 lb (69.9 kg)  12/09/17 154 lb (69.9 kg)   Physical Exam  Constitutional: She is oriented to person, place, and time. She appears well-developed and well-nourished. No distress.  HENT:  Head: Normocephalic and atraumatic.  Nose: Nose normal.  Mouth/Throat: Oropharynx is clear and moist. No oropharyngeal exudate.  Eyes: Pupils are equal, round, and reactive to light. Conjunctivae and EOM are normal. Right eye exhibits no discharge. Left eye exhibits no discharge.  Neck: Normal range of motion. Neck supple.  Cardiovascular: Normal rate, regular rhythm and intact distal pulses.  Pulmonary/Chest: Effort normal and breath sounds normal. She has no wheezes. She has no rales.  Abdominal: Soft. Bowel sounds are normal. There is no tenderness. There is no guarding.  Musculoskeletal: She exhibits edema.  Able to move all 4 extremities, unsteady gait, using walker  Lymphadenopathy:    She has no cervical adenopathy.  Neurological: She is alert and oriented to person, place, and time.  Skin: Skin is warm and dry. She is not diaphoretic.  Psychiatric:  She has a normal mood and affect. Her behavior is normal.    Labs reviewed: Recent Labs    02/06/17 04/10/17 07/07/17 09/29/17  NA 142 141  141 142 144  144  K 4.0 4.1  4.1 4.2 4.5  4.5  CL 104  --   --   --   CO2 29  --   --   --   BUN 27* 30* 34* 44*  CREATININE 1.19 1.1  1.11 1.14 1.3*  1.32  CALCIUM 9.0 9.2 9.3 9.3   Recent Labs    04/10/17 07/07/17 09/29/17  AST _0 ALT _1 ALKPHOS 47  47 48 46  46  BILITOT 0.6 0.5 0.5  PROT 5.8 5.9  --   ALBUMIN 3.4 3.6 3.8   Recent Labs    04/10/17 07/07/17 09/29/17  WBC 6.2  6.2 5.6 5.1  5.1  HGB 11.9*  11.9 12.5 12.1  12.1  HCT 38  37.7 37.4 37  37.1  PLT 202  --  191   Lab Results  Component Value Date   TSH 1.84 01/16/2017   Lab Results  Component Value Date   HGBA1C 5.7 03/13/2017   Lab Results  Component Value Date   CHOL 211 (A) 06/16/2017   HDL 74 (A) 06/16/2017   LDLCALC 117 06/16/2017   TRIG 96 06/16/2017    Significant Diagnostic Results in last 30 days:  No results found.  Assessment/Plan  1. Chronic diastolic congestive heart failure (HCC) Continue lasix for now, monitor BP and breathing  2. PVD (peripheral vascular disease) (HCC) Continue lasix, advised to wear compression stockings, skin care  3. Osteoarthritis of spine with radiculopathy, lumbar region Continue meloxicam and norco current regimen with tylenol. D/c lidocaine and monitor  4. Osteopenia of neck of left femur Continue calcium and vitamin d, fall precautions.   5. CKD (chronic kidney disease), stage III ([place]) Monitor renal function.   6. Unstable gait Fall precautions, continue to use walker for mobility.     Family/ staff Communication: reviewed care plan with patient and charge nurse.    Labs/tests ordered:  BMP   Blanchie Serve, MD Internal Medicine [facility] [address] [place], [place] [address] Cell Phone (Monday-Friday 8 am - 5  pm): [phone] On Call: [phone] and follow prompts after 5 pm and on weekends Office Phone: [phone] Office Fax: [phone]

## 2018-01-08 ENCOUNTER — Other Ambulatory Visit: Payer: Self-pay

## 2018-01-08 DIAGNOSIS — I10 Essential (primary) hypertension: Secondary | ICD-10-CM | POA: Diagnosis not present

## 2018-01-08 LAB — BASIC METABOLIC PANEL
BUN: 32 — AB (ref 4–21)
Calcium: 9.2
Creat: 1.36
GLUCOSE: 83
Potassium: 4.5
Sodium: 140

## 2018-01-08 MED ORDER — HYDROCODONE-ACETAMINOPHEN 5-325 MG PO TABS: 1.0000 | ORAL_TABLET | Freq: Three times a day (TID) | ORAL | 0 refills | Status: DC

## 2018-01-09 ENCOUNTER — Encounter: Payer: Self-pay | Admitting: *Deleted

## 2018-04-06 ENCOUNTER — Non-Acute Institutional Stay: Payer: Medicare Other | Admitting: Family

## 2018-04-06 ENCOUNTER — Encounter: Payer: Self-pay | Admitting: Family

## 2018-04-06 DIAGNOSIS — I739 Peripheral vascular disease, unspecified: Secondary | ICD-10-CM

## 2018-04-06 DIAGNOSIS — M4726 Other spondylosis with radiculopathy, lumbar region: Secondary | ICD-10-CM

## 2018-04-06 DIAGNOSIS — N183 Chronic kidney disease, stage 3 unspecified: Secondary | ICD-10-CM

## 2018-04-06 DIAGNOSIS — I5032 Chronic diastolic (congestive) heart failure: Secondary | ICD-10-CM

## 2018-04-06 DIAGNOSIS — R2681 Unsteadiness on feet: Secondary | ICD-10-CM

## 2018-04-13 DIAGNOSIS — R197 Diarrhea, unspecified: Secondary | ICD-10-CM | POA: Diagnosis not present

## 2018-04-13 LAB — BASIC METABOLIC PANEL
BUN: 33 — AB (ref 4–21)
Creatinine: 1.3 — AB (ref 0.5–1.1)
Glucose: 77
Potassium: 4.5 (ref 3.4–5.3)
Sodium: 141 (ref 137–147)

## 2018-04-13 LAB — CBC AND DIFFERENTIAL
HEMATOCRIT: 34 — AB (ref 36–46)
HEMOGLOBIN: 10.9 — AB (ref 12.0–16.0)
PLATELETS: 164 (ref 150–399)
WBC: 4.8

## 2018-04-13 LAB — HEPATIC FUNCTION PANEL
ALT: 10 (ref 7–35)
AST: 17 (ref 13–35)
Alkaline Phosphatase: 40 (ref 25–125)
BILIRUBIN, TOTAL: 0.4

## 2018-04-16 DIAGNOSIS — H353131 Nonexudative age-related macular degeneration, bilateral, early dry stage: Secondary | ICD-10-CM | POA: Diagnosis not present

## 2018-04-16 DIAGNOSIS — H524 Presbyopia: Secondary | ICD-10-CM | POA: Diagnosis not present

## 2018-04-16 DIAGNOSIS — E119 Type 2 diabetes mellitus without complications: Secondary | ICD-10-CM | POA: Diagnosis not present

## 2018-04-16 DIAGNOSIS — H52203 Unspecified astigmatism, bilateral: Secondary | ICD-10-CM | POA: Diagnosis not present

## 2018-04-16 DIAGNOSIS — H5213 Myopia, bilateral: Secondary | ICD-10-CM | POA: Diagnosis not present

## 2018-04-17 DIAGNOSIS — M79672 Pain in left foot: Secondary | ICD-10-CM | POA: Diagnosis not present

## 2018-04-17 DIAGNOSIS — M79671 Pain in right foot: Secondary | ICD-10-CM | POA: Diagnosis not present

## 2018-04-17 DIAGNOSIS — L602 Onychogryphosis: Secondary | ICD-10-CM | POA: Diagnosis not present

## 2018-04-21 NOTE — Progress Notes (Signed)
Location:  [place] Room Number: 23 Place of Service:  ALF [phone]) Provider: Khadeem Rockett FNP-C   Renwick Asman, Nelda Bucks, NP  Patient Care Team: Erykah Lippert, Nelda Bucks, NP as PCP - General (Family Medicine) Rutherford Guys, MD as Consulting Physician (Ophthalmology)  Extended Emergency Contact Information Primary Emergency Contact: Dy,Leonard Address: [address] [place] [address] Johnnette Litter of [place] Phone: [phone] Work Phone: [phone] Mobile Phone: [phone] Relation: Son Secondary Emergency Contact: [place] of [place] Phone: [phone] Relation: Friend  Code Status: DNR Goals of care: Advanced Directive information Advanced Directives 04/06/2018  Does Patient Have a Medical Advance Directive? Yes  Type of Paramedic of [place];Living will;Out of facility DNR (pink MOST or yellow form)  Does patient want to make changes to medical advance directive? No - Patient declined  Copy of [place] in Chart? Yes - validated most recent copy scanned in chart (See row information)  Would patient like information on creating a medical advance directive? -  Pre-existing out of facility DNR order (yellow form or pink MOST form) Pink MOST form placed in chart (order not valid for inpatient use)     Chief Complaint  Patient presents with  . Medical Management of Chronic Issues    routine visit    HPI:  Pt is a 84 y.o. female seen today [place] for medical management of chronic diseases.she has a medical histroy of HTN,chronic diastolic congestive heart failure,CKD stage 3,PVD,Type 2 DM off medication,OA among other conditions.she is seen in her room today with facility Nurse present at bedside.she denies any acute issues during visit.she states right hip pain persist but pain medication reliefs the pain.her pain is worst especially in the mornings  but improves as the day goes by.she states tries to stay active walking on the facility Hallway and participating in the activities.she has had no recent fall episodes.Her weight has been stable.Nurse reports no new concerns.    Past Medical History:  Diagnosis Date  . Acute blood loss anemia   . Allergy   . Asthma   . AVM (arteriovenous malformation) of colon with hemorrhage   . Bursitis of right hip   . Cataract   . CKD (chronic kidney disease), stage III ([place]) 11/04/2015  . COLONIC POLYPS 03/26/2005   Qualifier: Diagnosis of  By: Talbert Cage CMA ([place]), June    . DIVERTICULOSIS, COLON 03/26/2005   Qualifier: Diagnosis of  By: Talbert Cage CMA ([place]), June    . DVT (deep venous thrombosis), left 11/03/2015  . Ecchymosis 12/16/2015  . Hyperglycemia 12/16/2015  . Hyperlipidemia   . Hypertension   . Multiple skin tears 12/16/2015  . Osteopenia   . Stasis edema of both lower extremities 11/04/2015  . Unstable gait 12/16/2015  . Urine incontinence 12/16/2015   Past Surgical History:  Procedure Laterality Date  . ABDOMINAL HYSTERECTOMY  2006   Dr. Maryelizabeth Rowan  . CATARACT EXTRACTION W/ INTRAOCULAR LENS IMPLANT Bilateral 1999/2003   Dr. Rutherford Guys  . COLONOSCOPY N/A 11/06/2015   Procedure: COLONOSCOPY;  Surgeon: Mauri Pole, MD;  Location: [facility];  Service: Endoscopy;  Laterality: N/A;  MAC if available, otherwise moderate sedation    Allergies  Allergen Reactions  . Epinephrine Nausea Only    Unknown.  Marland Kitchen Hydrocodone-Acetaminophen     REACTION: nausea  . Labetalol   . Procaine     Allergies as  of 04/06/2018      Reactions   Epinephrine Nausea Only   Unknown.   Hydrocodone-acetaminophen    REACTION: nausea   Labetalol    Procaine       Medication List        Accurate as of 04/06/18 11:59 PM. Always use your most recent med list.          acetaminophen 500 MG tablet Commonly known as:  TYLENOL Take 500 mg by mouth 2 (two) times daily.   calcium carbonate 500 MG  chewable tablet Commonly known as:  TUMS - dosed in mg elemental calcium Chew 2 tablets by mouth every morning.   cholecalciferol 1000 units tablet Commonly known as:  VITAMIN D Take 1,000 Units by mouth daily.   furosemide 20 MG tablet Commonly known as:  LASIX Take 1 tablet (20 mg total) by mouth daily.   gabapentin 300 MG capsule Commonly known as:  NEURONTIN Take 300 mg by mouth 2 (two) times daily.   HYDROcodone-acetaminophen 5-325 MG tablet Commonly known as:  NORCO/VICODIN Take 1 tablet by mouth 3 (three) times daily.   loperamide 2 MG tablet Commonly known as:  IMODIUM A-D Take 4 mg by mouth as needed for diarrhea or loose stools. If 3 loose stools in 24 hours, hold all laxatives and stool softeners. Give Imodium AD (loperamide) 4 mg po initial dose, then 2 mg po after each loose stool X 48 hrs.(maximum of 52m in 24 hrs. If diarrhea continues, notify MD   meloxicam 7.5 MG tablet Commonly known as:  MOBIC Take 7.5 mg by mouth daily.   multivitamin with minerals tablet Take 1 tablet by mouth daily.   ICAPS MV PO Take 1 tablet by mouth daily.   nystatin powder Generic drug:  nystatin Apply to groin 2 times daily as needed       Review of Systems  Constitutional: Negative for activity change, appetite change, chills, fatigue, fever and unexpected weight change.  HENT: Negative for congestion, rhinorrhea, sinus pressure, sinus pain, sneezing, sore throat and trouble swallowing.   Eyes: Positive for visual disturbance. Negative for discharge, redness and itching.       Wears eye glasses   Respiratory: Negative for cough, chest tightness, shortness of breath and wheezing.   Cardiovascular: Negative for chest pain, palpitations and leg swelling.  Gastrointestinal: Negative for abdominal distention, abdominal pain, constipation, diarrhea, nausea and vomiting.  Endocrine: Negative for cold intolerance, heat intolerance, polydipsia, polyphagia and polyuria.    Genitourinary: Negative for dysuria, flank pain, frequency and urgency.  Musculoskeletal: Positive for arthralgias, back pain and gait problem.  Skin: Negative for color change, pallor, rash and wound.  Neurological: Negative for dizziness, weakness, light-headedness and headaches.  Hematological: Does not bruise/bleed easily.  Psychiatric/Behavioral: Negative for agitation, confusion and sleep disturbance. The patient is not nervous/anxious.     Immunization History  Administered Date(s) Administered  . DTaP 05/13/2005  . Influenza-Unspecified 02/11/2015, 02/22/2016, 02/19/2017, 02/16/2018  . PPD Test 12/08/2015, 12/22/2015  . Pneumococcal Conjugate-13 05/13/2014  . Pneumococcal Polysaccharide-23 05/13/2014  . Td 12/11/2005  . Tdap 01/04/2017  . Zoster 05/13/2014   Pertinent  Health Maintenance Due  Topic Date Due  . FOOT EXAM  09/12/1935  . PNA vac Low Risk Adult (2 of 2 - PPSV23) 05/14/2015  . HEMOGLOBIN A1C  09/10/2017  . OPHTHALMOLOGY EXAM  04/10/2018  . URINE MICROALBUMIN  07/07/2018  . INFLUENZA VACCINE  Completed  . DEXA SCAN  Completed   Fall Risk  01/05/2018 12/30/2016 11/03/2015  Falls in the past year? No No No    Vitals:   04/06/18 0943  BP: (!) 120/55  Pulse: 68  Resp: 16  Temp: 98.3 F (36.8 C)  SpO2: 95%  Weight: 156 lb 6.4 oz (70.9 kg)  Height: _0  (1.6 m)   Body mass index is 27.71 kg/m. Physical Exam  Constitutional: She is oriented to person, place, and time. She appears well-developed and well-nourished. No distress.  Elderly in no acute distress   HENT:  Head: Normocephalic.  Right Ear: External ear normal.  Left Ear: External ear normal.  Mouth/Throat: Oropharynx is clear and moist. No oropharyngeal exudate.  Eyes: Pupils are equal, round, and reactive to light. Conjunctivae and EOM are normal. Right eye exhibits no discharge. Left eye exhibits no discharge. No scleral icterus.  Corrective lens in place   Neck: Normal range of motion. No  JVD present. No thyromegaly present.  Cardiovascular: Normal rate, regular rhythm, normal heart sounds and intact distal pulses. Exam reveals no gallop and no friction rub.  No murmur heard. Pulmonary/Chest: Effort normal and breath sounds normal. No respiratory distress. She has no wheezes. She has no rales.  Abdominal: Soft. Bowel sounds are normal. She exhibits no distension and no mass. There is no tenderness. There is no rebound and no guarding.  Musculoskeletal: She exhibits no edema or tenderness.  Moves x 4 extremities except right hip limited due to pain.unsteady gait ambulates with walker.   Lymphadenopathy:    She has no cervical adenopathy.  Neurological: She is oriented to person, place, and time. Gait abnormal.  Skin: Skin is warm and dry. No rash noted. No erythema. No pallor.  Psychiatric: She has a normal mood and affect. Her speech is normal and behavior is normal. Judgment and thought content normal.  Nursing note and vitals reviewed.   Labs reviewed: Recent Labs    07/07/17 09/29/17 01/08/18 04/13/18  NA 142 144  144 140 141  K 4.2 4.5  4.5 4.5 4.5  BUN 34* 44* 32* 33*  CREATININE 1.14 1.3*  1.32 1.36 1.3*  CALCIUM 9.3 9.3 9.2  --    Recent Labs    07/07/17 09/29/17 04/13/18  AST _1 ALT _2 ALKPHOS 48 46  46 40  BILITOT 0.5 0.5  --   PROT 5.9  --   --   ALBUMIN 3.6 3.8  --    Recent Labs    09/29/17 04/13/18  WBC 5.1  5.1 4.8  HGB 12.1  12.1 10.9*  HCT 37  37.1 34*  PLT 191 164   Lab Results  Component Value Date   TSH 1.84 01/16/2017   Lab Results  Component Value Date   HGBA1C 5.7 03/13/2017   Lab Results  Component Value Date   CHOL 211 (A) 06/16/2017   HDL 74 (A) 06/16/2017   LDLCALC 117 06/16/2017   TRIG 96 06/16/2017    Significant Diagnostic Results in last 30 days:  No results found.  Assessment/Plan 1. Chronic diastolic congestive heart failure (HCC) Stable.No edema,abrupt weight gain or shortness of  breath.continue on Furosemide 20 mg tablet daily.continue to check weight monthly.check CBC.  2. Osteoarthritis of spine with radiculopathy, lumbar region Pain and stiffness worst on right hip in the mornings and improves as the day progresses.continue to encourage to exercise by walking daily.continue on Norco,Tylenol and Mobic.Fall and safety precautions.    3. PVD (peripheral vascular disease) (  [place]) Edema has improved and no ulceration.continue on Furosemide daily and Ted hose.  4. CKD (chronic kidney disease), stage III (HCC) latest CR at baseline.will check CMP   5. Unsteady gait No recent fall episode.continue to encourage exercise and use walker.Fall and safety precautions.   Family/ staff Communication: Reviewed plan of care with patient and facility Nurse.   Labs/tests ordered: CBC,CMP   Natoshia Souter C Leondre Taul, NP

## 2018-06-09 ENCOUNTER — Encounter: Payer: Self-pay | Admitting: Nurse Practitioner

## 2018-06-15 ENCOUNTER — Other Ambulatory Visit: Payer: Self-pay | Admitting: *Deleted

## 2018-06-15 DIAGNOSIS — M5416 Radiculopathy, lumbar region: Secondary | ICD-10-CM

## 2018-06-15 DIAGNOSIS — M25551 Pain in right hip: Secondary | ICD-10-CM

## 2018-06-15 DIAGNOSIS — G8929 Other chronic pain: Secondary | ICD-10-CM

## 2018-06-15 DIAGNOSIS — M25511 Pain in right shoulder: Secondary | ICD-10-CM

## 2018-06-15 MED ORDER — HYDROCODONE-ACETAMINOPHEN 5-325 MG PO TABS: 1.0000 | ORAL_TABLET | Freq: Three times a day (TID) | ORAL | 0 refills | Status: DC

## 2018-06-22 ENCOUNTER — Ambulatory Visit: Payer: Self-pay | Admitting: Allergy

## 2018-07-09 ENCOUNTER — Other Ambulatory Visit: Payer: Self-pay

## 2018-07-09 DIAGNOSIS — M25551 Pain in right hip: Secondary | ICD-10-CM

## 2018-07-09 DIAGNOSIS — G8929 Other chronic pain: Secondary | ICD-10-CM

## 2018-07-09 DIAGNOSIS — M25511 Pain in right shoulder: Secondary | ICD-10-CM

## 2018-07-09 DIAGNOSIS — M5416 Radiculopathy, lumbar region: Secondary | ICD-10-CM

## 2018-07-09 MED ORDER — HYDROCODONE-ACETAMINOPHEN 5-325 MG PO TABS: 1.0000 | ORAL_TABLET | Freq: Three times a day (TID) | ORAL | 0 refills | Status: DC

## 2018-07-31 ENCOUNTER — Encounter: Payer: Self-pay | Admitting: Internal Medicine

## 2018-07-31 NOTE — Progress Notes (Signed)
Location:  [place] Room Number: 23 Place of Service:  ALF (13) Provider:Juliene Kirsh L,MD   Mast, Man X, NP    A user error has taken place   Code Status: DNR Goals of care: Advanced Directive information Advanced Directives 07/31/2018  Does Patient Have a Medical Advance Directive? Yes  Type of Paramedic of [place];Out of facility DNR (pink MOST or yellow form);Living will  Does patient want to make changes to medical advance directive? No - Patient declined  Copy of [place] in Chart? Yes - validated most recent copy scanned in chart (See row information)  Would patient like information on creating a medical advance directive? -  Pre-existing out of facility DNR order (yellow form or pink MOST form) Yellow form placed in chart (order not valid for inpatient use);Pink MOST form placed in chart (order not valid for inpatient use)     Chief Complaint  Patient presents with  . Medical Management of Chronic Issues    routine visit     HPI:  Pt is a 85 y.o. female seen today for medical management of chronic diseases.     Past Medical History:  Diagnosis Date  . Acute blood loss anemia   . Allergy   . Asthma   . AVM (arteriovenous malformation) of colon with hemorrhage   . Bursitis of right hip   . Cataract   . CKD (chronic kidney disease), stage III ([place]) 11/04/2015  . COLONIC POLYPS 03/26/2005   Qualifier: Diagnosis of  By: Talbert Cage CMA ([place]), June    . DIVERTICULOSIS, COLON 03/26/2005   Qualifier: Diagnosis of  By: Talbert Cage CMA ([place]), June    . DVT (deep venous thrombosis), left 11/03/2015  . Ecchymosis 12/16/2015  . Hyperglycemia 12/16/2015  . Hyperlipidemia   . Hypertension   . Multiple skin tears 12/16/2015  . Osteopenia   . Stasis edema of both lower extremities 11/04/2015  . Unstable gait 12/16/2015  . Urine incontinence 12/16/2015   Past Surgical History:  Procedure Laterality Date  . ABDOMINAL  HYSTERECTOMY  2006   Dr. Maryelizabeth Rowan  . CATARACT EXTRACTION W/ INTRAOCULAR LENS IMPLANT Bilateral 1999/2003   Dr. Rutherford Guys  . COLONOSCOPY N/A 11/06/2015   Procedure: COLONOSCOPY;  Surgeon: Mauri Pole, MD;  Location: [facility];  Service: Endoscopy;  Laterality: N/A;  MAC if available, otherwise moderate sedation    Allergies  Allergen Reactions  . Epinephrine Nausea Only    Unknown.  Marland Kitchen Hydrocodone-Acetaminophen     REACTION: nausea  . Labetalol   . Procaine     Outpatient Encounter Medications as of 07/31/2018  Medication Sig  . acetaminophen (TYLENOL) 500 MG tablet Take 500 mg by mouth 2 (two) times daily.   . calcium carbonate (TUMS - DOSED IN MG ELEMENTAL CALCIUM) 500 MG chewable tablet Chew 2 tablets by mouth every morning.   . cholecalciferol (VITAMIN D) 1000 units tablet Take 1,000 Units by mouth daily.  . furosemide (LASIX) 20 MG tablet Take 1 tablet (20 mg total) by mouth daily.  Marland Kitchen gabapentin (NEURONTIN) 300 MG capsule Take 300 mg by mouth 2 (two) times daily.  Marland Kitchen HYDROcodone-acetaminophen (NORCO/VICODIN) 5-325 MG tablet Take 1 tablet by mouth 3 (three) times daily.  Marland Kitchen loperamide (IMODIUM A-D) 2 MG tablet Take 4 mg by mouth as needed for diarrhea or loose stools. If 3 loose stools in 24 hours, hold all laxatives and stool softeners. Give Imodium AD (loperamide) 4 mg po  initial dose, then 2 mg po after each loose stool X 48 hrs.(maximum of 35m in 24 hrs. If diarrhea continues, notify MD  . Multiple Vitamins-Minerals (ICAPS MV PO) Take 1 tablet by mouth daily.  . Multiple Vitamins-Minerals (MULTIVITAMIN WITH MINERALS) tablet Take 1 tablet by mouth daily.   .Marland Kitchennystatin (NYSTATIN) powder Apply to groin 2 times daily as needed   No facility-administered encounter medications on file as of 07/31/2018.     Review of Systems  Immunization History  Administered Date(s) Administered  . DTaP 05/13/2005  . Influenza-Unspecified 02/11/2015, 02/22/2016, 02/19/2017, 02/16/2018   . PPD Test 12/08/2015, 12/22/2015  . Pneumococcal Conjugate-13 05/13/2014  . Pneumococcal Polysaccharide-23 05/13/2014  . Td 12/11/2005  . Tdap 01/04/2017  . Zoster 05/13/2014   Pertinent  Health Maintenance Due  Topic Date Due  . FOOT EXAM  09/12/1935  . PNA vac Low Risk Adult (2 of 2 - PPSV23) 05/14/2015  . HEMOGLOBIN A1C  09/10/2017  . OPHTHALMOLOGY EXAM  04/10/2018  . URINE MICROALBUMIN  07/07/2018  . INFLUENZA VACCINE  Completed  . DEXA SCAN  Completed   Fall Risk  01/05/2018 12/30/2016 11/03/2015  Falls in the past year? No No No   Functional Status Survey:    Vitals:   07/31/18 0859  BP: 120/60  Pulse: 70  Resp: 20  Temp: 98.8 F (37.1 C)  SpO2: 93%  Weight: 158 lb 12.8 oz (72 kg)  Height: _0  (1.6 m)   Body mass index is 28.13 kg/m. Physical Exam  Labs reviewed: Recent Labs    09/29/17 01/08/18 04/13/18  NA 144  144 140 141  K 4.5  4.5 4.5 4.5  BUN 44* 32* 33*  CREATININE 1.3*  1.32 1.36 1.3*  CALCIUM 9.3 9.2  --    Recent Labs    09/29/17 04/13/18  AST _1 ALT _2 ALKPHOS 46  46 40  BILITOT 0.5  --   ALBUMIN 3.8  --    Recent Labs    09/29/17 04/13/18  WBC 5.1  5.1 4.8  HGB 12.1  12.1 10.9*  HCT 37  37.1 34*  PLT 191 164   Lab Results  Component Value Date   TSH 1.84 01/16/2017   Lab Results  Component Value Date   HGBA1C 5.7 03/13/2017   Lab Results  Component Value Date   CHOL 211 (A) 06/16/2017   HDL 74 (A) 06/16/2017   LDLCALC 117 06/16/2017   TRIG 96 06/16/2017    Significant Diagnostic Results in last 30 days:  No results found.  Assessment/Plan There are no diagnoses linked to this encounter.   Family/ staff Communication:   Labs/tests ordered:

## 2018-08-11 ENCOUNTER — Other Ambulatory Visit: Payer: Self-pay

## 2018-08-11 DIAGNOSIS — G8929 Other chronic pain: Secondary | ICD-10-CM

## 2018-08-11 DIAGNOSIS — M25551 Pain in right hip: Secondary | ICD-10-CM

## 2018-08-11 DIAGNOSIS — M25511 Pain in right shoulder: Secondary | ICD-10-CM

## 2018-08-11 DIAGNOSIS — M5416 Radiculopathy, lumbar region: Secondary | ICD-10-CM

## 2018-08-11 MED ORDER — HYDROCODONE-ACETAMINOPHEN 5-325 MG PO TABS: 1.0000 | ORAL_TABLET | Freq: Three times a day (TID) | ORAL | 0 refills | Status: DC

## 2018-09-10 ENCOUNTER — Other Ambulatory Visit: Payer: Self-pay | Admitting: *Deleted

## 2018-09-10 DIAGNOSIS — M25551 Pain in right hip: Secondary | ICD-10-CM

## 2018-09-10 DIAGNOSIS — M25511 Pain in right shoulder: Secondary | ICD-10-CM

## 2018-09-10 DIAGNOSIS — G8929 Other chronic pain: Secondary | ICD-10-CM

## 2018-09-10 DIAGNOSIS — M5416 Radiculopathy, lumbar region: Secondary | ICD-10-CM

## 2018-09-10 MED ORDER — HYDROCODONE-ACETAMINOPHEN 5-325 MG PO TABS: ORAL_TABLET | ORAL | 0 refills | Status: DC

## 2018-09-10 NOTE — Telephone Encounter (Signed)
Received fax from [facility] requesting refill on Norco 5/325 Take one tablet by mouth three times daily  Pended Rx and sent to [facility] for approval.

## 2018-09-29 ENCOUNTER — Non-Acute Institutional Stay: Payer: Medicare Other | Admitting: Nurse Practitioner

## 2018-09-29 ENCOUNTER — Encounter: Payer: Self-pay | Admitting: Nurse Practitioner

## 2018-09-29 DIAGNOSIS — M159 Polyosteoarthritis, unspecified: Secondary | ICD-10-CM | POA: Diagnosis not present

## 2018-09-29 DIAGNOSIS — G8929 Other chronic pain: Secondary | ICD-10-CM

## 2018-09-29 DIAGNOSIS — M5416 Radiculopathy, lumbar region: Secondary | ICD-10-CM | POA: Diagnosis not present

## 2018-09-29 DIAGNOSIS — N183 Chronic kidney disease, stage 3 unspecified: Secondary | ICD-10-CM

## 2018-09-29 DIAGNOSIS — I739 Peripheral vascular disease, unspecified: Secondary | ICD-10-CM | POA: Diagnosis not present

## 2018-09-29 DIAGNOSIS — I5032 Chronic diastolic (congestive) heart failure: Secondary | ICD-10-CM | POA: Diagnosis not present

## 2018-09-29 NOTE — Assessment & Plan Note (Addendum)
Well controlled, continue Gabapentin 300mg  bid, Norco 5/325mg  tid, Tylenol 500mg  bid.

## 2018-09-29 NOTE — Assessment & Plan Note (Signed)
Complaining stiffness in fingers in am, no pain, wears off after getting up. Will observe. May consider testing RAF, ESR, CRP to r/o rheumatoid arthritis.

## 2018-09-29 NOTE — Assessment & Plan Note (Signed)
In setting of CHF, no open wounds, chronic trace edema BLE.

## 2018-09-29 NOTE — Assessment & Plan Note (Signed)
Compensated clinically, continue Furosemide 20mg  qd.

## 2018-09-29 NOTE — Progress Notes (Signed)
Location:   [facility] Room Number: 23/A Place of Service:  ALF (13) Provider: Lennie Odor Maezie Justin NP  Berley Gambrell X, NP  Patient Care Team: Baer Hinton X, NP as PCP - General (Internal Medicine) Rutherford Guys, MD as Consulting Physician (Ophthalmology) [name], MD as Consulting Physician (Internal Medicine)  Extended Emergency Contact Information Primary Emergency Contact: Radoncic,Leonard Address: [address] [address] Johnnette Litter of [place] Phone: [phone] Work Phone: [phone] Mobile Phone: [phone] Relation: Son Secondary Emergency Contact: [place] of [place] Phone: [phone] Relation: Friend  Code Status:  DNR Goals of care: Advanced Directive information Advanced Directives 09/29/2018  Does Patient Have a Medical Advance Directive? Yes  Type of Paramedic of [place];Out of facility DNR (pink MOST or yellow form);Living will  Does patient want to make changes to medical advance directive? No - Patient declined  Copy of [place] in Chart? Yes - validated most recent copy scanned in chart (See row information)  Would patient like information on creating a medical advance directive? -  Pre-existing out of facility DNR order (yellow form or pink MOST form) Yellow form placed in chart (order not valid for inpatient use);Pink MOST form placed in chart (order not valid for inpatient use)     Chief Complaint  Patient presents with  . Medical Management of Chronic Issues    Routine visit    HPI:  Pt is a 85 y.o. female seen today for medical management of chronic diseases.    The patient resides in [place] for safety and care assistance, better with her lower back pain, but c/o sometime stiffness in fingers in am-not new, comes and goes, usually wears off after getting up, on Tylenol 522m bid. Chronic CHF/ edema BLE, trace, on Furosemide 215mqd.  Lower back  pain, controlled on Gabapentin 30023mid, Norco 5/325m13md.    Past Medical History:  Diagnosis Date  . Acute blood loss anemia   . Allergy   . Asthma   . AVM (arteriovenous malformation) of colon with hemorrhage   . Bursitis of right hip   . Cataract   . CKD (chronic kidney disease), stage III (HCC)[place]24/2017  . COLONIC POLYPS 03/26/2005   Qualifier: Diagnosis of  By: McMuTalbert Cage (AAM[place]une    . DIVERTICULOSIS, COLON 03/26/2005   Qualifier: Diagnosis of  By: McMuTalbert Cage (AAMA[place]    . DVT (deep venous thrombosis), left 11/03/2015  . Ecchymosis 12/16/2015  . Hyperglycemia 12/16/2015  . Hyperlipidemia   . Hypertension   . Multiple skin tears 12/16/2015  . Osteopenia   . Stasis edema of both lower extremities 11/04/2015  . Unstable gait 12/16/2015  . Urine incontinence 12/16/2015   Past Surgical History:  Procedure Laterality Date  . ABDOMINAL HYSTERECTOMY  2006   Dr. Wes Maryelizabeth RowanCATARACT EXTRACTION W/ INTRAOCULAR LENS IMPLANT Bilateral 1999/2003   Dr. MarkRutherford GuysCOLONOSCOPY N/A 11/06/2015   Procedure: COLONOSCOPY;  Surgeon: KaviMauri Pole;  [facility]: [facility];  Service: Endoscopy;  Laterality: N/A;  MAC if available, otherwise moderate sedation    Allergies  Allergen Reactions  . Epinephrine Nausea Only    Unknown.  . HyMarland Kitchenrocodone-Acetaminophen     REACTION: nausea  . Labetalol   . Procaine     Allergies as of 09/29/2018      Reactions   Epinephrine Nausea  Only   Unknown.   Hydrocodone-acetaminophen    REACTION: nausea   Labetalol    Procaine       Medication List       Accurate as of Sep 29, 2018  2:47 PM. If you have any questions, ask your nurse or doctor.        acetaminophen 500 MG tablet Commonly known as:  TYLENOL Take 500 mg by mouth 2 (two) times daily.   calcium carbonate 500 MG chewable tablet Commonly known as:  TUMS - dosed in mg elemental calcium Chew 2 tablets by mouth every morning.   cholecalciferol 1000  units tablet Commonly known as:  VITAMIN D Take 1,000 Units by mouth daily.   furosemide 20 MG tablet Commonly known as:  LASIX Take 1 tablet (20 mg total) by mouth daily.   gabapentin 300 MG capsule Commonly known as:  NEURONTIN Take 300 mg by mouth 2 (two) times daily.   HYDROcodone-acetaminophen 5-325 MG tablet Commonly known as:  NORCO/VICODIN Take one tablet by mouth three times daily for pain   loperamide 2 MG tablet Commonly known as:  IMODIUM A-D Take 4 mg by mouth as needed for diarrhea or loose stools. If 3 loose stools in 24 hours, hold all laxatives and stool softeners. Give Imodium AD (loperamide) 4 mg po initial dose, then 2 mg po after each loose stool X 48 hrs.(maximum of 58m in 24 hrs. If diarrhea continues, notify MD   multivitamin with minerals tablet Take 1 tablet by mouth daily.   ICAPS MV PO Take 1 tablet by mouth daily.   nystatin powder Generic drug:  nystatin Apply to groin 2 times daily as needed      ROS was provided with assistance of staff Review of Systems  Constitutional: Negative for activity change, appetite change, chills, diaphoresis, fatigue, fever and unexpected weight change.  HENT: Positive for hearing loss. Negative for congestion and voice change.   Respiratory: Negative for cough, shortness of breath and wheezing.   Cardiovascular: Positive for leg swelling. Negative for chest pain and palpitations.  Gastrointestinal: Negative for abdominal distention, abdominal pain, constipation, diarrhea, nausea and vomiting.  Genitourinary: Negative for difficulty urinating, dysuria and urgency.  Musculoskeletal: Positive for arthralgias, back pain and gait problem.       Stiffness in fingers in am  Skin: Negative for color change and pallor.  Neurological: Negative for dizziness, speech difficulty, weakness and headaches.  Psychiatric/Behavioral: Negative for agitation, behavioral problems, hallucinations and sleep disturbance. The patient is  not nervous/anxious.     Immunization History  Administered Date(s) Administered  . DTaP 05/13/2005  . Influenza-Unspecified 02/11/2015, 02/22/2016, 02/19/2017, 02/16/2018  . PPD Test 12/08/2015, 12/22/2015  . Pneumococcal Conjugate-13 05/13/2014  . Pneumococcal Polysaccharide-23 05/13/2014  . Td 12/11/2005  . Tdap 01/04/2017  . Zoster 05/13/2014   Pertinent  Health Maintenance Due  Topic Date Due  . FOOT EXAM  09/12/1935  . PNA vac Low Risk Adult (2 of 2 - PPSV23) 05/14/2015  . HEMOGLOBIN A1C  09/10/2017  . OPHTHALMOLOGY EXAM  04/10/2018  . URINE MICROALBUMIN  07/07/2018  . INFLUENZA VACCINE  12/12/2018  . DEXA SCAN  Completed   Fall Risk  01/05/2018 12/30/2016 11/03/2015  Falls in the past year? No No No   Functional Status Survey:    Vitals:   09/29/18 0915  BP: 132/63  Pulse: 60  Resp: 20  Temp: (!) 97.4 F (36.3 C)  SpO2: 95%  Weight: 158 lb 8 oz (71.9 kg)  Height: _0  (1.6 m)   Body mass index is 28.08 kg/m. Physical Exam Vitals signs and nursing note reviewed.  Constitutional:      General: She is not in acute distress.    Appearance: Normal appearance. She is not ill-appearing, toxic-appearing or diaphoretic.     Comments: Over weight  HENT:     Head: Normocephalic and atraumatic.     Nose: Nose normal.     Mouth/Throat:     Mouth: Mucous membranes are moist.  Eyes:     Extraocular Movements: Extraocular movements intact.     Conjunctiva/sclera: Conjunctivae normal.     Pupils: Pupils are equal, round, and reactive to light.  Neck:     Musculoskeletal: Normal range of motion and neck supple.  Cardiovascular:     Rate and Rhythm: Normal rate and regular rhythm.     Heart sounds: No murmur.  Pulmonary:     Effort: Pulmonary effort is normal.     Breath sounds: No wheezing, rhonchi or rales.  Abdominal:     General: There is no distension.     Palpations: Abdomen is soft.     Tenderness: There is no abdominal tenderness. There is no right CVA  tenderness, left CVA tenderness, guarding or rebound.  Musculoskeletal:     Right lower leg: Edema present.     Left lower leg: Edema present.     Comments: Trace to 1+ edema BLE. Ambulates with walker.   Skin:    General: Skin is warm and dry.  Neurological:     General: No focal deficit present.     Mental Status: She is alert. Mental status is at baseline.     Cranial Nerves: No cranial nerve deficit.     Motor: No weakness.     Coordination: Coordination normal.     Gait: Gait abnormal.     Comments: Oriented to person and place.   Psychiatric:        Mood and Affect: Mood normal.        Behavior: Behavior normal.        Thought Content: Thought content normal.        Judgment: Judgment normal.     Labs reviewed: Recent Labs    01/08/18 04/13/18  NA 140 141  K 4.5 4.5  BUN 32* 33*  CREATININE 1.36 1.3*  CALCIUM 9.2  --    Recent Labs    04/13/18  AST 17  ALT 10  ALKPHOS 40   Recent Labs    04/13/18  WBC 4.8  HGB 10.9*  HCT 34*  PLT 164   Lab Results  Component Value Date   TSH 1.84 01/16/2017   Lab Results  Component Value Date   HGBA1C 5.7 03/13/2017   Lab Results  Component Value Date   CHOL 211 (A) 06/16/2017   HDL 74 (A) 06/16/2017   LDLCALC 117 06/16/2017   TRIG 96 06/16/2017    Significant Diagnostic Results in last 30 days:  No results found.  Assessment/Plan Chronic diastolic congestive heart failure ([place]) Compensated clinically, continue Furosemide 36m qd.   PVD (peripheral vascular disease) ([place] In setting of CHF, no open wounds, chronic trace edema BLE.   Chronic radicular pain of lower back Well controlled, continue Gabapentin 3051mbid, Norco 5/32533mid, Tylenol 500m29md.   Osteoarthritis involving multiple joints on both sides of body Complaining stiffness in fingers in am, no pain, wears off after getting up. Will observe. May consider testing RAF, ESR,  CRP to r/o rheumatoid arthritis.   CKD (chronic kidney disease),  stage III 04/13/18 creat 1.3 at her baseline.    Family/ staff Communication: plan of care reviewed with the patient and charge nurse.   Labs/tests ordered:  none  Time spend 40 minutes.

## 2018-09-29 NOTE — Assessment & Plan Note (Signed)
04/13/18 creat 1.3 at her baseline.

## 2018-10-09 ENCOUNTER — Other Ambulatory Visit: Payer: Self-pay

## 2018-10-09 DIAGNOSIS — M25511 Pain in right shoulder: Secondary | ICD-10-CM

## 2018-10-09 DIAGNOSIS — G8929 Other chronic pain: Secondary | ICD-10-CM

## 2018-10-09 DIAGNOSIS — M25551 Pain in right hip: Secondary | ICD-10-CM

## 2018-10-09 MED ORDER — HYDROCODONE-ACETAMINOPHEN 5-325 MG PO TABS: ORAL_TABLET | ORAL | 0 refills | Status: DC

## 2018-10-09 NOTE — Telephone Encounter (Signed)
Last refill 09/10/18  Routed to DR Lyndel Safe for refill approval

## 2018-11-07 DIAGNOSIS — Z03818 Encounter for observation for suspected exposure to other biological agents ruled out: Secondary | ICD-10-CM | POA: Diagnosis not present

## 2018-11-09 LAB — NOVEL CORONAVIRUS, NAA: SARS-CoV-2, NAA: NOT DETECTED

## 2018-11-12 ENCOUNTER — Other Ambulatory Visit: Payer: Self-pay

## 2018-11-12 DIAGNOSIS — M25551 Pain in right hip: Secondary | ICD-10-CM

## 2018-11-12 DIAGNOSIS — G8929 Other chronic pain: Secondary | ICD-10-CM

## 2018-11-12 DIAGNOSIS — M5416 Radiculopathy, lumbar region: Secondary | ICD-10-CM

## 2018-11-12 MED ORDER — HYDROCODONE-ACETAMINOPHEN 5-325 MG PO TABS: ORAL_TABLET | ORAL | 0 refills | Status: DC

## 2018-12-11 ENCOUNTER — Other Ambulatory Visit: Payer: Self-pay

## 2018-12-11 DIAGNOSIS — M25511 Pain in right shoulder: Secondary | ICD-10-CM

## 2018-12-11 DIAGNOSIS — M25551 Pain in right hip: Secondary | ICD-10-CM

## 2018-12-11 DIAGNOSIS — M79672 Pain in left foot: Secondary | ICD-10-CM | POA: Diagnosis not present

## 2018-12-11 DIAGNOSIS — G8929 Other chronic pain: Secondary | ICD-10-CM

## 2018-12-11 DIAGNOSIS — L602 Onychogryphosis: Secondary | ICD-10-CM | POA: Diagnosis not present

## 2018-12-11 DIAGNOSIS — M79671 Pain in right foot: Secondary | ICD-10-CM | POA: Diagnosis not present

## 2018-12-11 MED ORDER — HYDROCODONE-ACETAMINOPHEN 5-325 MG PO TABS: ORAL_TABLET | ORAL | 0 refills | Status: DC

## 2018-12-17 ENCOUNTER — Non-Acute Institutional Stay: Payer: Medicare Other | Admitting: Internal Medicine

## 2018-12-17 ENCOUNTER — Encounter: Payer: Self-pay | Admitting: Internal Medicine

## 2018-12-17 DIAGNOSIS — E1121 Type 2 diabetes mellitus with diabetic nephropathy: Secondary | ICD-10-CM

## 2018-12-17 DIAGNOSIS — N183 Chronic kidney disease, stage 3 unspecified: Secondary | ICD-10-CM

## 2018-12-17 DIAGNOSIS — M159 Polyosteoarthritis, unspecified: Secondary | ICD-10-CM | POA: Diagnosis not present

## 2018-12-17 DIAGNOSIS — I5032 Chronic diastolic (congestive) heart failure: Secondary | ICD-10-CM

## 2018-12-17 DIAGNOSIS — M4726 Other spondylosis with radiculopathy, lumbar region: Secondary | ICD-10-CM

## 2018-12-17 NOTE — Progress Notes (Signed)
Location:  [facility] Room Number: 23 Place of Service:  ALF (10) Provider:Minoru Chap L,MD   Mast, Man X, NP  Patient Care Team: Mast, Man X, NP as PCP - General (Internal Medicine) Rutherford Guys, MD as Consulting Physician (Ophthalmology) [name], MD as Consulting Physician (Internal Medicine)  Extended Emergency Contact Information Primary Emergency Contact: Vavra,Leonard Address: [address] [place] [address] Johnnette Litter of [place] Phone: [phone] Work Phone: [phone] Mobile Phone: [phone] Relation: Son Secondary Emergency Contact: Gerarda F[place] of [place] Phone: [phone] Relation: Friend  Code Status:DNR  Goals of care: Advanced Directive information Advanced Directives 12/17/2018  Does Patient Have a Medical Advance Directive? Yes  Type of Paramedic of [place];Living will;Out of facility DNR (pink MOST or yellow form)  Does patient want to make changes to medical advance directive? No - Patient declined  Copy of [place] in Chart? Yes - validated most recent copy scanned in chart (See row information)  Would patient like information on creating a medical advance directive? -  Pre-existing out of facility DNR order (yellow form or pink MOST form) Yellow form placed in chart (order not valid for inpatient use);Pink MOST form placed in chart (order not valid for inpatient use)     Chief Complaint  Patient presents with  . Medical Management of Chronic Issues    routine visit   . Health Maintenance    foot exam, ppsv23. hemoglobin a1c, ophthalmology exam, urine microalbumin,influenza vaccine     HPI:  Pt is a 85 y.o. female seen today for medical management of chronic diseases.   Patient has h/o Hypertension, Diabetes Mellitus, H/o CKD,, [place] back Pain and Arhtritis [place] She say her Low back pain is  well controlled on Norco and Neurontin   She is on low-dose of Lasix.  Her blood pressure has been controlled and her weight has been stable.  Patient walks with her walker around in the facility.  Most of her family lives out of town. Her main complaint today was some arthritis and weakness in her hands.   Past Medical History:  Diagnosis Date  . Acute blood loss anemia   . Allergy   . Asthma   . AVM (arteriovenous malformation) of colon with hemorrhage   . Bursitis of right hip   . Cataract   . CKD (chronic kidney disease), stage III ([place]) 11/04/2015  . COLONIC POLYPS 03/26/2005   Qualifier: Diagnosis of  By: Talbert Cage CMA ([place]), June    . DIVERTICULOSIS, COLON 03/26/2005   Qualifier: Diagnosis of  By: Talbert Cage CMA ([place]), June    . DVT (deep venous thrombosis), left 11/03/2015  . Ecchymosis 12/16/2015  . Hyperglycemia 12/16/2015  . Hyperlipidemia   . Hypertension   . Multiple skin tears 12/16/2015  . Osteopenia   . Stasis edema of both lower extremities 11/04/2015  . Unstable gait 12/16/2015  . Urine incontinence 12/16/2015   Past Surgical History:  Procedure Laterality Date  . ABDOMINAL HYSTERECTOMY  2006   Dr. Maryelizabeth Rowan  . CATARACT EXTRACTION W/ INTRAOCULAR LENS IMPLANT Bilateral 1999/2003   Dr. Rutherford Guys  . COLONOSCOPY N/A 11/06/2015   Procedure: COLONOSCOPY;  Surgeon: Mauri Pole, MD;  [facility]: [facility];  Service: Endoscopy;  Laterality: N/A;  MAC if available, otherwise moderate sedation    Allergies  Allergen Reactions  . Epinephrine  Nausea Only    Unknown.  Marland Kitchen Hydrocodone-Acetaminophen     REACTION: nausea  . Labetalol   . Procaine     Outpatient Encounter Medications as of 12/17/2018  Medication Sig  . acetaminophen (TYLENOL) 500 MG tablet Take 500 mg by mouth 2 (two) times daily.   . calcium carbonate (TUMS - DOSED IN MG ELEMENTAL CALCIUM) 500 MG chewable tablet Chew 2 tablets by mouth every morning.   . cholecalciferol (VITAMIN D) 1000 units tablet Take  1,000 Units by mouth daily.  . furosemide (LASIX) 20 MG tablet Take 1 tablet (20 mg total) by mouth daily.  Marland Kitchen gabapentin (NEURONTIN) 300 MG capsule Take 300 mg by mouth 2 (two) times daily.  Marland Kitchen HYDROcodone-acetaminophen (NORCO/VICODIN) 5-325 MG tablet Take one tablet by mouth three times daily for pain  . loperamide (IMODIUM A-D) 2 MG tablet Take 4 mg by mouth as needed for diarrhea or loose stools. If 3 loose stools in 24 hours, hold all laxatives and stool softeners. Give Imodium AD (loperamide) 4 mg po initial dose, then 2 mg po after each loose stool X 48 hrs.(maximum of 24m in 24 hrs. If diarrhea continues, notify MD  . Multiple Vitamins-Minerals (ICAPS MV PO) Take 1 tablet by mouth daily.  . Multiple Vitamins-Minerals (MULTIVITAMIN WITH MINERALS) tablet Take 1 tablet by mouth daily.   .Marland Kitchennystatin (NYSTATIN) powder Apply to groin 2 times daily as needed   No facility-administered encounter medications on file as of 12/17/2018.     Review of Systems  Review of Systems  Constitutional: Negative for activity change, appetite change, chills, diaphoresis, fatigue and fever.  HENT: Negative for mouth sores, postnasal drip, rhinorrhea, sinus pain and sore throat.   Respiratory: Negative for apnea, cough, chest tightness, shortness of breath and wheezing.   Cardiovascular: Negative for chest pain, palpitations and leg swelling.  Gastrointestinal: Negative for abdominal distention, abdominal pain, constipation, diarrhea, nausea and vomiting.  Genitourinary: Negative for dysuria and frequency.  Musculoskeletal: Negative for arthralgias, joint swelling and myalgias.  Skin: Negative for rash.  Neurological: Negative for dizziness, syncope, weakness, light-headedness and numbness.  Psychiatric/Behavioral: Negative for behavioral problems, confusion and sleep disturbance.     Immunization History  Administered Date(s) Administered  . DTaP 05/13/2005  . Influenza-Unspecified 02/11/2015, 02/22/2016,  02/19/2017, 02/16/2018  . PPD Test 12/08/2015, 12/22/2015  . Pneumococcal Conjugate-13 05/13/2014  . Pneumococcal Polysaccharide-23 05/13/2014  . Td 12/11/2005  . Tdap 01/04/2017  . Zoster 05/13/2014   Pertinent  Health Maintenance Due  Topic Date Due  . FOOT EXAM  09/12/1935  . PNA vac Low Risk Adult (2 of 2 - PPSV23) 05/14/2015  . HEMOGLOBIN A1C  09/10/2017  . OPHTHALMOLOGY EXAM  04/10/2018  . URINE MICROALBUMIN  07/07/2018  . INFLUENZA VACCINE  12/12/2018  . DEXA SCAN  Completed   Fall Risk  01/05/2018 12/30/2016 11/03/2015  Falls in the past year? No No No   Functional Status Survey:    Vitals:   12/17/18 0813  BP: (!) 107/56  Pulse: 61  Resp: 20  Temp: (!) 97.1 F (36.2 C)  SpO2: 91%  Weight: 158 lb 3.2 oz (71.8 kg)  Height: _0  (1.6 m)   Body mass index is 28.02 kg/m. Physical Exam  Constitutional: Oriented to person, place, and time. Well-developed and well-nourished.  HENT:  Head: Normocephalic.  Mouth/Throat: Oropharynx is clear and moist.  Eyes: Pupils are equal, round, and reactive to light.  Neck: Neck supple.  Cardiovascular: Normal rate and normal heart sounds.  No murmur heard. Pulmonary/Chest: Effort normal and breath sounds normal. No respiratory distress. No wheezes. She has no rales.  Abdominal: Soft. Bowel sounds are normal. No distension. There is no tenderness. There is no rebound.  Musculoskeletal: Mild edema Bilateral. Has  arthritis in her Hands Lymphadenopathy: none Neurological: Alert and oriented to person, place, and time. No Focal Deficits  Skin: Skin is warm and dry.  Psychiatric: Normal mood and affect. Behavior is normal. Thought content normal.    Labs reviewed: Recent Labs    01/08/18 04/13/18  NA 140 141  K 4.5 4.5  BUN 32* 33*  CREATININE 1.36 1.3*  CALCIUM 9.2  --    Recent Labs    04/13/18  AST 17  ALT 10  ALKPHOS 40   Recent Labs    04/13/18  WBC 4.8  HGB 10.9*  HCT 34*  PLT 164   Lab Results   Component Value Date   TSH 1.84 01/16/2017   Lab Results  Component Value Date   HGBA1C 5.7 03/13/2017   Lab Results  Component Value Date   CHOL 211 (A) 06/16/2017   HDL 74 (A) 06/16/2017   LDLCALC 117 06/16/2017   TRIG 96 06/16/2017    Significant Diagnostic Results in last 30 days:  No results found.  Assessment/Plan Chronic diastolic congestive heart failure ([place]) - Plan:  Continue on Lasix  Osteoarthritis of spine with radiculopathy, lumbar region - Plan:  On Neurontin Walks with Walker  CKD (chronic kidney disease), stage III ([place]) - Plan: Repeat BMP  Osteoarthritis involving multiple joints on both sides of body - Plan:  Continue To walk and pain control with Norco   Type 2 diabetes mellitus  She has it listed in her problem Not on any meds Will check A1C   Family/ staff Communication:   Labs/tests ordered:  BMP,CBC, A1C  Total time spent in this patient care encounter was  25_  minutes; greater than 50% of the visit spent counseling patient and staff, reviewing records , Labs and coordinating care for problems addressed at this encounter.

## 2018-12-21 ENCOUNTER — Other Ambulatory Visit: Payer: Self-pay | Admitting: *Deleted

## 2018-12-21 DIAGNOSIS — M25511 Pain in right shoulder: Secondary | ICD-10-CM

## 2018-12-21 DIAGNOSIS — I10 Essential (primary) hypertension: Secondary | ICD-10-CM | POA: Diagnosis not present

## 2018-12-21 DIAGNOSIS — M25551 Pain in right hip: Secondary | ICD-10-CM

## 2018-12-21 DIAGNOSIS — E782 Mixed hyperlipidemia: Secondary | ICD-10-CM | POA: Diagnosis not present

## 2018-12-21 DIAGNOSIS — G8929 Other chronic pain: Secondary | ICD-10-CM

## 2018-12-21 LAB — CBC AND DIFFERENTIAL
HCT: 38 (ref 36–46)
Hemoglobin: 12.5 (ref 12.0–16.0)
Neutrophils Absolute: 1950
Platelets: 185 (ref 150–399)
WBC: 4.9

## 2018-12-21 LAB — HEPATIC FUNCTION PANEL
ALT: 11 (ref 7–35)
AST: 20 (ref 13–35)
Alkaline Phosphatase: 45 (ref 25–125)
Bilirubin, Total: 0.5

## 2018-12-21 LAB — TSH: TSH: 1.73 (ref 0.41–5.90)

## 2018-12-21 LAB — BASIC METABOLIC PANEL
BUN: 28 — AB (ref 4–21)
Creatinine: 1.2 — AB (ref 0.5–1.1)
Glucose: 73
Potassium: 4.2 (ref 3.4–5.3)
Sodium: 141 (ref 137–147)

## 2018-12-21 NOTE — Telephone Encounter (Signed)
Received fax refill request from [facility] stating that patient only has 4 pills left and needs refill.  Our record shows we refilled on 12/11/2018 for # 90 but they stated they only received #30. Pended and sent to [facility] for approval.

## 2018-12-22 MED ORDER — HYDROCODONE-ACETAMINOPHEN 5-325 MG PO TABS: ORAL_TABLET | ORAL | 0 refills | Status: DC

## 2019-01-08 ENCOUNTER — Other Ambulatory Visit: Payer: Self-pay

## 2019-01-08 DIAGNOSIS — M25551 Pain in right hip: Secondary | ICD-10-CM

## 2019-01-08 DIAGNOSIS — G8929 Other chronic pain: Secondary | ICD-10-CM

## 2019-01-08 DIAGNOSIS — M25511 Pain in right shoulder: Secondary | ICD-10-CM

## 2019-01-08 MED ORDER — HYDROCODONE-ACETAMINOPHEN 5-325 MG PO TABS: ORAL_TABLET | ORAL | 0 refills | Status: DC

## 2019-02-05 ENCOUNTER — Other Ambulatory Visit: Payer: Self-pay | Admitting: *Deleted

## 2019-02-05 DIAGNOSIS — M25551 Pain in right hip: Secondary | ICD-10-CM

## 2019-02-05 DIAGNOSIS — G8929 Other chronic pain: Secondary | ICD-10-CM

## 2019-02-05 NOTE — Telephone Encounter (Signed)
Refill Request Pended Rx and sent to Dr. Lyndel Safe for approval.

## 2019-02-07 MED ORDER — HYDROCODONE-ACETAMINOPHEN 5-325 MG PO TABS: ORAL_TABLET | ORAL | 0 refills | Status: DC

## 2019-03-10 ENCOUNTER — Other Ambulatory Visit: Payer: Self-pay

## 2019-03-10 DIAGNOSIS — M25551 Pain in right hip: Secondary | ICD-10-CM

## 2019-03-10 DIAGNOSIS — M5416 Radiculopathy, lumbar region: Secondary | ICD-10-CM

## 2019-03-10 DIAGNOSIS — M25511 Pain in right shoulder: Secondary | ICD-10-CM

## 2019-03-10 DIAGNOSIS — G8929 Other chronic pain: Secondary | ICD-10-CM

## 2019-03-10 MED ORDER — HYDROCODONE-ACETAMINOPHEN 5-325 MG PO TABS: ORAL_TABLET | ORAL | 0 refills | Status: DC

## 2019-03-16 ENCOUNTER — Encounter: Payer: Self-pay | Admitting: Nurse Practitioner

## 2019-03-16 ENCOUNTER — Non-Acute Institutional Stay: Payer: Medicare Other | Admitting: Nurse Practitioner

## 2019-03-16 DIAGNOSIS — E1121 Type 2 diabetes mellitus with diabetic nephropathy: Secondary | ICD-10-CM

## 2019-03-16 DIAGNOSIS — N1831 Chronic kidney disease, stage 3a: Secondary | ICD-10-CM

## 2019-03-16 DIAGNOSIS — N183 Chronic kidney disease, stage 3 unspecified: Secondary | ICD-10-CM | POA: Diagnosis not present

## 2019-03-16 DIAGNOSIS — I130 Hypertensive heart and chronic kidney disease with heart failure and stage 1 through stage 4 chronic kidney disease, or unspecified chronic kidney disease: Secondary | ICD-10-CM | POA: Diagnosis not present

## 2019-03-16 DIAGNOSIS — M4726 Other spondylosis with radiculopathy, lumbar region: Secondary | ICD-10-CM | POA: Diagnosis not present

## 2019-03-16 DIAGNOSIS — G609 Hereditary and idiopathic neuropathy, unspecified: Secondary | ICD-10-CM | POA: Diagnosis not present

## 2019-03-16 DIAGNOSIS — E1122 Type 2 diabetes mellitus with diabetic chronic kidney disease: Secondary | ICD-10-CM

## 2019-03-16 LAB — CHLORIDE
Albumin: 3.9
Calcium: 9.3
Carbon Dioxide, Total: 30
Chloride: 105
Globulin: 2.1
Total Protein: 6 — AB (ref 6.4–8.2)

## 2019-03-16 NOTE — Assessment & Plan Note (Signed)
Compensated, chronic trace edema BLE, continue Furosemide 20mg  qd.

## 2019-03-16 NOTE — Assessment & Plan Note (Signed)
RUE, BLE, will refer to therapy to eval and treat as indicated, continue Gabapentin 300mg  bid, Tylenol 500mg  bid.

## 2019-03-16 NOTE — Assessment & Plan Note (Signed)
Pain is controlled, continue Norco 5/325mg  tid, Tylenol 500mg  bid, Gabapentin 300mg  bid.

## 2019-03-16 NOTE — Progress Notes (Signed)
Location:   [facility] Room Number: 24 Place of Service:  ALF (13) Provider:  Kristy Catoe X, NP  Mariavictoria Nottingham X, NP  Patient Care Team: Stanislawa Gaffin X, NP as PCP - General (Internal Medicine) Rutherford Guys, MD as Consulting Physician (Ophthalmology) [name], MD as Consulting Physician (Internal Medicine)  Extended Emergency Contact Information Primary Emergency Contact: Overbeck,Leonard Address: [address] [place] [address] Johnnette Litter of [place] Phone: [phone] Work Phone: [phone] Mobile Phone: [phone] Relation: Son Secondary Emergency Contact: [place] of [place] Phone: [phone] Relation: Friend  Code Status:  DNR Goals of care: Advanced Directive information Advanced Directives 03/16/2019  Does Patient Have a Medical Advance Directive? Yes  Type of Advance Directive Out of facility DNR (pink MOST or yellow form)  Does patient want to make changes to medical advance directive? No - Patient declined  Copy of [place] in Chart? -  Would patient like information on creating a medical advance directive? -  Pre-existing out of facility DNR order (yellow form or pink MOST form) Pink MOST form placed in chart (order not valid for inpatient use)     Chief Complaint  Patient presents with  . Medical Management of Chronic Issues  . Health Maintenance    PPSV23 and urine microalbumin    HPI:  Pt is a 85 y.o. female seen today for medical management of chronic diseases.    The patient resides in [place] for safety, care assistance, ambulates with walker. OA, back/hips, controlled on Norco 5/344m tid, Tylenol 5054mbid.  Peripheral neuropathy, RUE BLE, taking Gabapentin 30045mid, Tylenol 500m70md. CHF/chronic edema BLE,, stable, on Furosemide 20mg87m   Past Medical History:  Diagnosis Date  . Acute blood loss anemia   . Allergy   . Asthma   . AVM (arteriovenous  malformation) of colon with hemorrhage   . Bursitis of right hip   . Cataract   . CKD (chronic kidney disease), stage III 11/04/2015  . COLONIC POLYPS 03/26/2005   Qualifier: Diagnosis of  By: McMurTalbert Cage(AAMA)[place]    . DIVERTICULOSIS, COLON 03/26/2005   Qualifier: Diagnosis of  By: McMurTalbert Cage(AAMA)[employer]    . DVT (deep venous thrombosis), left 11/03/2015  . Ecchymosis 12/16/2015  . Hyperglycemia 12/16/2015  . Hyperlipidemia   . Hypertension   . Multiple skin tears 12/16/2015  . Osteopenia   . Stasis edema of both lower extremities 11/04/2015  . Unstable gait 12/16/2015  . Urine incontinence 12/16/2015   Past Surgical History:  Procedure Laterality Date  . ABDOMINAL HYSTERECTOMY  2006   Dr. Wes DMaryelizabeth RowanATARACT EXTRACTION W/ INTRAOCULAR LENS IMPLANT Bilateral 1999/2003   Dr. Mark Rutherford GuysOLONOSCOPY N/A 11/06/2015   Procedure: COLONOSCOPY;  Surgeon: KavitMauri Pole  Location: [facility];  Service: Endoscopy;  Laterality: N/A;  MAC if available, otherwise moderate sedation    Allergies  Allergen Reactions  . Epinephrine Nausea Only    Unknown.  . HydMarland Kitchenocodone-Acetaminophen     REACTION: nausea  . Labetalol   . Procaine     Allergies as of 03/16/2019      Reactions   Epinephrine Nausea Only   Unknown.   Hydrocodone-acetaminophen    REACTION: nausea   Labetalol    Procaine       Medication List       Accurate as of  March 16, 2019  2:38 PM. If you have any questions, ask your nurse or doctor.        acetaminophen 500 MG tablet Commonly known as: TYLENOL Take 500 mg by mouth 2 (two) times daily.   calcium carbonate 500 MG chewable tablet Commonly known as: TUMS - dosed in mg elemental calcium Chew 2 tablets by mouth every morning.   cholecalciferol 1000 units tablet Commonly known as: VITAMIN D Take 1,000 Units by mouth daily.   furosemide 20 MG tablet Commonly known as: LASIX Take 1 tablet (20 mg total) by mouth daily.   gabapentin 300 MG  capsule Commonly known as: NEURONTIN Take 300 mg by mouth 2 (two) times daily.   HYDROcodone-acetaminophen 5-325 MG tablet Commonly known as: NORCO/VICODIN Take one tablet by mouth three times daily for pain   loperamide 2 MG tablet Commonly known as: IMODIUM A-D Take 4 mg by mouth as needed for diarrhea or loose stools. If 3 loose stools in 24 hours, hold all laxatives and stool softeners. Give Imodium AD (loperamide) 4 mg po initial dose, then 2 mg po after each loose stool X 48 hrs.(maximum of 74m in 24 hrs. If diarrhea continues, notify MD   multivitamin with minerals tablet Take 1 tablet by mouth daily.   ICAPS MV PO Take 1 tablet by mouth daily.   nystatin powder Generic drug: nystatin Apply to groin 2 times daily as needed      ROS was provided with assistance of staff.  Review of Systems  Constitutional: Negative for activity change, appetite change, chills, diaphoresis, fatigue, fever and unexpected weight change.  HENT: Positive for hearing loss. Negative for congestion and voice change.   Respiratory: Negative for cough, shortness of breath and wheezing.   Cardiovascular: Positive for leg swelling. Negative for chest pain and palpitations.  Gastrointestinal: Negative for abdominal distention, abdominal pain, constipation, diarrhea, nausea and vomiting.  Genitourinary: Negative for difficulty urinating, dysuria, frequency and urgency.  Musculoskeletal: Positive for arthralgias, back pain and gait problem.  Skin: Negative for color change.  Neurological: Negative for dizziness, speech difficulty, weakness and headaches.       Memory lapses.   Psychiatric/Behavioral: Negative for agitation, behavioral problems, hallucinations and sleep disturbance. The patient is not nervous/anxious.     Immunization History  Administered Date(s) Administered  . DTaP 05/13/2005  . Influenza-Unspecified 02/11/2015, 02/22/2016, 02/19/2017, 02/16/2018  . PPD Test 12/08/2015,  12/22/2015  . Pneumococcal Conjugate-13 05/13/2014  . Pneumococcal Polysaccharide-23 05/13/2014  . Td 12/11/2005  . Tdap 01/04/2017  . Zoster 05/13/2014   Pertinent  Health Maintenance Due  Topic Date Due  . PNA vac Low Risk Adult (2 of 2 - PPSV23) 05/14/2015  . URINE MICROALBUMIN  07/07/2018  . HEMOGLOBIN A1C  04/15/2019 (Originally 09/10/2017)  . OPHTHALMOLOGY EXAM  09/13/2019 (Originally 04/10/2018)  . FOOT EXAM  03/15/2020 (Originally 09/12/1935)  . INFLUENZA VACCINE  Completed  . DEXA SCAN  Completed   Fall Risk  01/05/2018 12/30/2016 11/03/2015  Falls in the past year? No No No   Functional Status Survey:    Vitals:   03/16/19 0930  BP: (!) 107/56  Pulse: 61  Resp: 20  Temp: (!) 97 F (36.1 C)  SpO2: 95%  Weight: 160 lb 8 oz (72.8 kg)  Height: _0  (1.6 m)   Body mass index is 28.43 kg/m. Physical Exam Vitals signs and nursing note reviewed.  Constitutional:      General: She is not in acute distress.  Appearance: Normal appearance. She is not ill-appearing, toxic-appearing or diaphoretic.     Comments: Over weight  HENT:     Nose: Nose normal.     Mouth/Throat:     Mouth: Mucous membranes are moist.  Eyes:     Extraocular Movements: Extraocular movements intact.     Conjunctiva/sclera: Conjunctivae normal.     Pupils: Pupils are equal, round, and reactive to light.  Neck:     Musculoskeletal: Normal range of motion and neck supple.  Cardiovascular:     Rate and Rhythm: Normal rate and regular rhythm.     Heart sounds: No murmur.  Pulmonary:     Breath sounds: Rales present. No wheezing.     Comments: Bibasilar rales.  Abdominal:     General: There is no distension.     Palpations: Abdomen is soft.     Tenderness: There is no abdominal tenderness. There is no right CVA tenderness, left CVA tenderness, guarding or rebound.  Musculoskeletal:     Right lower leg: Edema present.     Left lower leg: Edema present.     Comments: Trace edema BLE, ambulates  with walker.   Skin:    General: Skin is warm and dry.  Neurological:     General: No focal deficit present.     Mental Status: She is alert. Mental status is at baseline.     Cranial Nerves: No cranial nerve deficit.     Motor: No weakness.     Gait: Gait abnormal.  Psychiatric:        Mood and Affect: Mood normal.        Behavior: Behavior normal.        Thought Content: Thought content normal.        Judgment: Judgment normal.     Labs reviewed: Recent Labs    04/13/18 12/21/18  NA 141 141  K 4.5 4.2  CL  --  105  CO2  --  30  BUN 33* 28*  CREATININE 1.3* 1.2*  CALCIUM  --  9.3   Recent Labs    04/13/18 12/21/18  AST 17 20  ALT 10 11  ALKPHOS 40 45  PROT  --  6.0*  ALBUMIN  --  3.9   Recent Labs    04/13/18 12/21/18  WBC 4.8 4.9  NEUTROABS  --  1,950  HGB 10.9* 12.5  HCT 34* 38  PLT 164 185   Lab Results  Component Value Date   TSH 1.73 12/21/2018   Lab Results  Component Value Date   HGBA1C 5.7 03/13/2017   Lab Results  Component Value Date   CHOL 211 (A) 06/16/2017   HDL 74 (A) 06/16/2017   LDLCALC 117 06/16/2017   TRIG 96 06/16/2017    Significant Diagnostic Results in last 30 days:  No results found.  Assessment/Plan Peripheral neuropathy RUE, BLE, will refer to therapy to eval and treat as indicated, continue Gabapentin 321m bid, Tylenol 509mbid.   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   Osteoarthritis of spine with radiculopathy, lumbar region Pain is controlled, continue Norco 5/325m48md, Tylenol 500mg55m, Gabapentin 300mg 38m   Type 2 diabetes mellitus with diabetic chronic kidney disease (HCC) [employer] controlled, Bun/creat 28/1.2 12/21/18, 33/1.3 04/13/18, stable, will delay urine albumin testing.      Family/ staff Communication: plan of care reviewed with the patient and charge nurse. PPSV  23 order sent. die  Labs/tests  ordered:  Delay urine albumin.   Time spend 40 minutes.

## 2019-03-16 NOTE — Assessment & Plan Note (Addendum)
Diet controlled, Bun/creat 28/1.2 12/21/18, 33/1.3 04/13/18, stable, will delay urine albumin testing.

## 2019-03-17 DIAGNOSIS — M6281 Muscle weakness (generalized): Secondary | ICD-10-CM | POA: Diagnosis not present

## 2019-03-17 DIAGNOSIS — R2689 Other abnormalities of gait and mobility: Secondary | ICD-10-CM | POA: Diagnosis not present

## 2019-03-17 DIAGNOSIS — M25551 Pain in right hip: Secondary | ICD-10-CM | POA: Diagnosis not present

## 2019-03-17 DIAGNOSIS — M545 Low back pain: Secondary | ICD-10-CM | POA: Diagnosis not present

## 2019-03-17 DIAGNOSIS — R293 Abnormal posture: Secondary | ICD-10-CM | POA: Diagnosis not present

## 2019-03-17 DIAGNOSIS — R2681 Unsteadiness on feet: Secondary | ICD-10-CM | POA: Diagnosis not present

## 2019-03-18 DIAGNOSIS — R2681 Unsteadiness on feet: Secondary | ICD-10-CM | POA: Diagnosis not present

## 2019-03-18 DIAGNOSIS — R2689 Other abnormalities of gait and mobility: Secondary | ICD-10-CM | POA: Diagnosis not present

## 2019-03-18 DIAGNOSIS — R293 Abnormal posture: Secondary | ICD-10-CM | POA: Diagnosis not present

## 2019-03-18 DIAGNOSIS — M545 Low back pain: Secondary | ICD-10-CM | POA: Diagnosis not present

## 2019-03-18 DIAGNOSIS — M25551 Pain in right hip: Secondary | ICD-10-CM | POA: Diagnosis not present

## 2019-03-18 DIAGNOSIS — M6281 Muscle weakness (generalized): Secondary | ICD-10-CM | POA: Diagnosis not present

## 2019-03-23 DIAGNOSIS — M545 Low back pain: Secondary | ICD-10-CM | POA: Diagnosis not present

## 2019-03-23 DIAGNOSIS — R2681 Unsteadiness on feet: Secondary | ICD-10-CM | POA: Diagnosis not present

## 2019-03-23 DIAGNOSIS — M25551 Pain in right hip: Secondary | ICD-10-CM | POA: Diagnosis not present

## 2019-03-23 DIAGNOSIS — M6281 Muscle weakness (generalized): Secondary | ICD-10-CM | POA: Diagnosis not present

## 2019-03-23 DIAGNOSIS — R2689 Other abnormalities of gait and mobility: Secondary | ICD-10-CM | POA: Diagnosis not present

## 2019-03-23 DIAGNOSIS — R293 Abnormal posture: Secondary | ICD-10-CM | POA: Diagnosis not present

## 2019-03-24 DIAGNOSIS — M25551 Pain in right hip: Secondary | ICD-10-CM | POA: Diagnosis not present

## 2019-03-24 DIAGNOSIS — M6281 Muscle weakness (generalized): Secondary | ICD-10-CM | POA: Diagnosis not present

## 2019-03-24 DIAGNOSIS — R2689 Other abnormalities of gait and mobility: Secondary | ICD-10-CM | POA: Diagnosis not present

## 2019-03-24 DIAGNOSIS — M545 Low back pain: Secondary | ICD-10-CM | POA: Diagnosis not present

## 2019-03-24 DIAGNOSIS — R2681 Unsteadiness on feet: Secondary | ICD-10-CM | POA: Diagnosis not present

## 2019-03-24 DIAGNOSIS — R293 Abnormal posture: Secondary | ICD-10-CM | POA: Diagnosis not present

## 2019-03-25 DIAGNOSIS — R293 Abnormal posture: Secondary | ICD-10-CM | POA: Diagnosis not present

## 2019-03-25 DIAGNOSIS — M545 Low back pain: Secondary | ICD-10-CM | POA: Diagnosis not present

## 2019-03-25 DIAGNOSIS — M25551 Pain in right hip: Secondary | ICD-10-CM | POA: Diagnosis not present

## 2019-03-25 DIAGNOSIS — M6281 Muscle weakness (generalized): Secondary | ICD-10-CM | POA: Diagnosis not present

## 2019-03-25 DIAGNOSIS — R2689 Other abnormalities of gait and mobility: Secondary | ICD-10-CM | POA: Diagnosis not present

## 2019-03-25 DIAGNOSIS — R2681 Unsteadiness on feet: Secondary | ICD-10-CM | POA: Diagnosis not present

## 2019-03-26 DIAGNOSIS — R293 Abnormal posture: Secondary | ICD-10-CM | POA: Diagnosis not present

## 2019-03-26 DIAGNOSIS — R2689 Other abnormalities of gait and mobility: Secondary | ICD-10-CM | POA: Diagnosis not present

## 2019-03-26 DIAGNOSIS — M6281 Muscle weakness (generalized): Secondary | ICD-10-CM | POA: Diagnosis not present

## 2019-03-26 DIAGNOSIS — M545 Low back pain: Secondary | ICD-10-CM | POA: Diagnosis not present

## 2019-03-26 DIAGNOSIS — R2681 Unsteadiness on feet: Secondary | ICD-10-CM | POA: Diagnosis not present

## 2019-03-26 DIAGNOSIS — M25551 Pain in right hip: Secondary | ICD-10-CM | POA: Diagnosis not present

## 2019-03-29 DIAGNOSIS — R2689 Other abnormalities of gait and mobility: Secondary | ICD-10-CM | POA: Diagnosis not present

## 2019-03-29 DIAGNOSIS — M6281 Muscle weakness (generalized): Secondary | ICD-10-CM | POA: Diagnosis not present

## 2019-03-29 DIAGNOSIS — M545 Low back pain: Secondary | ICD-10-CM | POA: Diagnosis not present

## 2019-03-29 DIAGNOSIS — R293 Abnormal posture: Secondary | ICD-10-CM | POA: Diagnosis not present

## 2019-03-29 DIAGNOSIS — M25551 Pain in right hip: Secondary | ICD-10-CM | POA: Diagnosis not present

## 2019-03-29 DIAGNOSIS — R2681 Unsteadiness on feet: Secondary | ICD-10-CM | POA: Diagnosis not present

## 2019-03-30 DIAGNOSIS — M545 Low back pain: Secondary | ICD-10-CM | POA: Diagnosis not present

## 2019-03-30 DIAGNOSIS — R2681 Unsteadiness on feet: Secondary | ICD-10-CM | POA: Diagnosis not present

## 2019-03-30 DIAGNOSIS — M6281 Muscle weakness (generalized): Secondary | ICD-10-CM | POA: Diagnosis not present

## 2019-03-30 DIAGNOSIS — R2689 Other abnormalities of gait and mobility: Secondary | ICD-10-CM | POA: Diagnosis not present

## 2019-03-30 DIAGNOSIS — M25551 Pain in right hip: Secondary | ICD-10-CM | POA: Diagnosis not present

## 2019-03-30 DIAGNOSIS — R293 Abnormal posture: Secondary | ICD-10-CM | POA: Diagnosis not present

## 2019-04-01 DIAGNOSIS — R2689 Other abnormalities of gait and mobility: Secondary | ICD-10-CM | POA: Diagnosis not present

## 2019-04-01 DIAGNOSIS — M545 Low back pain: Secondary | ICD-10-CM | POA: Diagnosis not present

## 2019-04-01 DIAGNOSIS — R293 Abnormal posture: Secondary | ICD-10-CM | POA: Diagnosis not present

## 2019-04-01 DIAGNOSIS — R2681 Unsteadiness on feet: Secondary | ICD-10-CM | POA: Diagnosis not present

## 2019-04-01 DIAGNOSIS — M6281 Muscle weakness (generalized): Secondary | ICD-10-CM | POA: Diagnosis not present

## 2019-04-01 DIAGNOSIS — M25551 Pain in right hip: Secondary | ICD-10-CM | POA: Diagnosis not present

## 2019-04-02 DIAGNOSIS — R2681 Unsteadiness on feet: Secondary | ICD-10-CM | POA: Diagnosis not present

## 2019-04-02 DIAGNOSIS — R293 Abnormal posture: Secondary | ICD-10-CM | POA: Diagnosis not present

## 2019-04-02 DIAGNOSIS — M545 Low back pain: Secondary | ICD-10-CM | POA: Diagnosis not present

## 2019-04-02 DIAGNOSIS — R2689 Other abnormalities of gait and mobility: Secondary | ICD-10-CM | POA: Diagnosis not present

## 2019-04-02 DIAGNOSIS — M6281 Muscle weakness (generalized): Secondary | ICD-10-CM | POA: Diagnosis not present

## 2019-04-02 DIAGNOSIS — M25551 Pain in right hip: Secondary | ICD-10-CM | POA: Diagnosis not present

## 2019-04-05 DIAGNOSIS — M25551 Pain in right hip: Secondary | ICD-10-CM | POA: Diagnosis not present

## 2019-04-05 DIAGNOSIS — R293 Abnormal posture: Secondary | ICD-10-CM | POA: Diagnosis not present

## 2019-04-05 DIAGNOSIS — M545 Low back pain: Secondary | ICD-10-CM | POA: Diagnosis not present

## 2019-04-05 DIAGNOSIS — R2681 Unsteadiness on feet: Secondary | ICD-10-CM | POA: Diagnosis not present

## 2019-04-05 DIAGNOSIS — M6281 Muscle weakness (generalized): Secondary | ICD-10-CM | POA: Diagnosis not present

## 2019-04-05 DIAGNOSIS — R2689 Other abnormalities of gait and mobility: Secondary | ICD-10-CM | POA: Diagnosis not present

## 2019-04-06 DIAGNOSIS — M6281 Muscle weakness (generalized): Secondary | ICD-10-CM | POA: Diagnosis not present

## 2019-04-06 DIAGNOSIS — R2681 Unsteadiness on feet: Secondary | ICD-10-CM | POA: Diagnosis not present

## 2019-04-06 DIAGNOSIS — R293 Abnormal posture: Secondary | ICD-10-CM | POA: Diagnosis not present

## 2019-04-06 DIAGNOSIS — R2689 Other abnormalities of gait and mobility: Secondary | ICD-10-CM | POA: Diagnosis not present

## 2019-04-06 DIAGNOSIS — M545 Low back pain: Secondary | ICD-10-CM | POA: Diagnosis not present

## 2019-04-06 DIAGNOSIS — M25551 Pain in right hip: Secondary | ICD-10-CM | POA: Diagnosis not present

## 2019-04-07 ENCOUNTER — Other Ambulatory Visit: Payer: Self-pay | Admitting: Internal Medicine

## 2019-04-07 DIAGNOSIS — M25551 Pain in right hip: Secondary | ICD-10-CM

## 2019-04-07 DIAGNOSIS — G8929 Other chronic pain: Secondary | ICD-10-CM

## 2019-04-07 DIAGNOSIS — M5416 Radiculopathy, lumbar region: Secondary | ICD-10-CM

## 2019-04-07 MED ORDER — HYDROCODONE-ACETAMINOPHEN 5-325 MG PO TABS: ORAL_TABLET | ORAL | 0 refills | Status: DC

## 2019-04-09 DIAGNOSIS — R2681 Unsteadiness on feet: Secondary | ICD-10-CM | POA: Diagnosis not present

## 2019-04-09 DIAGNOSIS — R293 Abnormal posture: Secondary | ICD-10-CM | POA: Diagnosis not present

## 2019-04-09 DIAGNOSIS — M6281 Muscle weakness (generalized): Secondary | ICD-10-CM | POA: Diagnosis not present

## 2019-04-09 DIAGNOSIS — R2689 Other abnormalities of gait and mobility: Secondary | ICD-10-CM | POA: Diagnosis not present

## 2019-04-09 DIAGNOSIS — M25551 Pain in right hip: Secondary | ICD-10-CM | POA: Diagnosis not present

## 2019-04-09 DIAGNOSIS — M545 Low back pain: Secondary | ICD-10-CM | POA: Diagnosis not present

## 2019-04-13 DIAGNOSIS — R293 Abnormal posture: Secondary | ICD-10-CM | POA: Diagnosis not present

## 2019-04-13 DIAGNOSIS — M25551 Pain in right hip: Secondary | ICD-10-CM | POA: Diagnosis not present

## 2019-04-13 DIAGNOSIS — R2681 Unsteadiness on feet: Secondary | ICD-10-CM | POA: Diagnosis not present

## 2019-04-13 DIAGNOSIS — R2689 Other abnormalities of gait and mobility: Secondary | ICD-10-CM | POA: Diagnosis not present

## 2019-04-13 DIAGNOSIS — M545 Low back pain: Secondary | ICD-10-CM | POA: Diagnosis not present

## 2019-04-13 DIAGNOSIS — M7071 Other bursitis of hip, right hip: Secondary | ICD-10-CM | POA: Diagnosis not present

## 2019-04-13 DIAGNOSIS — E782 Mixed hyperlipidemia: Secondary | ICD-10-CM | POA: Diagnosis not present

## 2019-04-15 DIAGNOSIS — M545 Low back pain: Secondary | ICD-10-CM | POA: Diagnosis not present

## 2019-04-15 DIAGNOSIS — R2689 Other abnormalities of gait and mobility: Secondary | ICD-10-CM | POA: Diagnosis not present

## 2019-04-15 DIAGNOSIS — M7071 Other bursitis of hip, right hip: Secondary | ICD-10-CM | POA: Diagnosis not present

## 2019-04-15 DIAGNOSIS — R293 Abnormal posture: Secondary | ICD-10-CM | POA: Diagnosis not present

## 2019-04-15 DIAGNOSIS — E782 Mixed hyperlipidemia: Secondary | ICD-10-CM | POA: Diagnosis not present

## 2019-04-15 DIAGNOSIS — M25551 Pain in right hip: Secondary | ICD-10-CM | POA: Diagnosis not present

## 2019-04-15 DIAGNOSIS — R2681 Unsteadiness on feet: Secondary | ICD-10-CM | POA: Diagnosis not present

## 2019-04-16 DIAGNOSIS — R293 Abnormal posture: Secondary | ICD-10-CM | POA: Diagnosis not present

## 2019-04-16 DIAGNOSIS — E782 Mixed hyperlipidemia: Secondary | ICD-10-CM | POA: Diagnosis not present

## 2019-04-16 DIAGNOSIS — M545 Low back pain: Secondary | ICD-10-CM | POA: Diagnosis not present

## 2019-04-16 DIAGNOSIS — R2689 Other abnormalities of gait and mobility: Secondary | ICD-10-CM | POA: Diagnosis not present

## 2019-04-16 DIAGNOSIS — M25551 Pain in right hip: Secondary | ICD-10-CM | POA: Diagnosis not present

## 2019-04-16 DIAGNOSIS — R2681 Unsteadiness on feet: Secondary | ICD-10-CM | POA: Diagnosis not present

## 2019-04-16 DIAGNOSIS — M7071 Other bursitis of hip, right hip: Secondary | ICD-10-CM | POA: Diagnosis not present

## 2019-04-19 DIAGNOSIS — M7071 Other bursitis of hip, right hip: Secondary | ICD-10-CM | POA: Diagnosis not present

## 2019-04-19 DIAGNOSIS — R2681 Unsteadiness on feet: Secondary | ICD-10-CM | POA: Diagnosis not present

## 2019-04-19 DIAGNOSIS — E782 Mixed hyperlipidemia: Secondary | ICD-10-CM | POA: Diagnosis not present

## 2019-04-19 DIAGNOSIS — R293 Abnormal posture: Secondary | ICD-10-CM | POA: Diagnosis not present

## 2019-04-19 DIAGNOSIS — R2689 Other abnormalities of gait and mobility: Secondary | ICD-10-CM | POA: Diagnosis not present

## 2019-04-19 DIAGNOSIS — M545 Low back pain: Secondary | ICD-10-CM | POA: Diagnosis not present

## 2019-04-19 DIAGNOSIS — M25551 Pain in right hip: Secondary | ICD-10-CM | POA: Diagnosis not present

## 2019-05-05 ENCOUNTER — Other Ambulatory Visit: Payer: Self-pay | Admitting: *Deleted

## 2019-05-05 DIAGNOSIS — M25551 Pain in right hip: Secondary | ICD-10-CM

## 2019-05-05 DIAGNOSIS — G8929 Other chronic pain: Secondary | ICD-10-CM

## 2019-05-05 DIAGNOSIS — M5416 Radiculopathy, lumbar region: Secondary | ICD-10-CM

## 2019-05-05 MED ORDER — HYDROCODONE-ACETAMINOPHEN 5-325 MG PO TABS: ORAL_TABLET | ORAL | 0 refills | Status: DC

## 2019-05-05 NOTE — Telephone Encounter (Signed)
Received fax refill Request from [facility] and sent to [facility] for approval.

## 2019-06-04 DIAGNOSIS — M79671 Pain in right foot: Secondary | ICD-10-CM | POA: Diagnosis not present

## 2019-06-04 DIAGNOSIS — L602 Onychogryphosis: Secondary | ICD-10-CM | POA: Diagnosis not present

## 2019-06-04 DIAGNOSIS — M79672 Pain in left foot: Secondary | ICD-10-CM | POA: Diagnosis not present

## 2019-06-09 ENCOUNTER — Other Ambulatory Visit: Payer: Self-pay | Admitting: *Deleted

## 2019-06-09 DIAGNOSIS — M25511 Pain in right shoulder: Secondary | ICD-10-CM

## 2019-06-09 DIAGNOSIS — G8929 Other chronic pain: Secondary | ICD-10-CM

## 2019-06-09 DIAGNOSIS — M25551 Pain in right hip: Secondary | ICD-10-CM

## 2019-06-09 DIAGNOSIS — M5416 Radiculopathy, lumbar region: Secondary | ICD-10-CM

## 2019-06-09 MED ORDER — HYDROCODONE-ACETAMINOPHEN 5-325 MG PO TABS: ORAL_TABLET | ORAL | 0 refills | Status: DC

## 2019-06-09 NOTE — Telephone Encounter (Signed)
Received fax from [facility] for refill Pended Rx and sent to [facility] for approval.

## 2019-07-08 ENCOUNTER — Other Ambulatory Visit: Payer: Self-pay | Admitting: *Deleted

## 2019-07-08 DIAGNOSIS — G8929 Other chronic pain: Secondary | ICD-10-CM

## 2019-07-08 DIAGNOSIS — M25551 Pain in right hip: Secondary | ICD-10-CM

## 2019-07-08 DIAGNOSIS — M25511 Pain in right shoulder: Secondary | ICD-10-CM

## 2019-07-08 MED ORDER — HYDROCODONE-ACETAMINOPHEN 5-325 MG PO TABS: ORAL_TABLET | ORAL | 0 refills | Status: DC

## 2019-07-08 NOTE — Telephone Encounter (Signed)
Received fax from [facility] °Pended Rx and sent to [facility] for approval.  °

## 2019-07-19 ENCOUNTER — Encounter: Payer: Self-pay | Admitting: Internal Medicine

## 2019-07-19 ENCOUNTER — Non-Acute Institutional Stay: Payer: Medicare Other | Admitting: Internal Medicine

## 2019-07-19 DIAGNOSIS — N200 Calculus of kidney: Secondary | ICD-10-CM | POA: Diagnosis not present

## 2019-07-19 DIAGNOSIS — R7989 Other specified abnormal findings of blood chemistry: Secondary | ICD-10-CM | POA: Diagnosis not present

## 2019-07-19 DIAGNOSIS — I5032 Chronic diastolic (congestive) heart failure: Secondary | ICD-10-CM

## 2019-07-19 DIAGNOSIS — M4726 Other spondylosis with radiculopathy, lumbar region: Secondary | ICD-10-CM

## 2019-07-19 DIAGNOSIS — E1121 Type 2 diabetes mellitus with diabetic nephropathy: Secondary | ICD-10-CM | POA: Diagnosis not present

## 2019-07-19 DIAGNOSIS — N1831 Chronic kidney disease, stage 3a: Secondary | ICD-10-CM | POA: Diagnosis not present

## 2019-07-19 DIAGNOSIS — Z79899 Other long term (current) drug therapy: Secondary | ICD-10-CM | POA: Diagnosis not present

## 2019-07-19 NOTE — Progress Notes (Signed)
Location:  [place] Room Number: 23 Place of Service:  ALF [phone])  Provider: Veleta Miners MD  Code Status: DNR Goals of Care:  Advanced Directives 07/19/2019  Does Patient Have a Medical Advance Directive? Yes  Type of Advance Directive Out of facility DNR (pink MOST or yellow form);Living will;Healthcare Power of Attorney  Does patient want to make changes to medical advance directive? No - Patient declined  Copy of [place] in Chart? Yes - validated most recent copy scanned in chart (See row information)  Would patient like information on creating a medical advance directive? -  Pre-existing out of facility DNR order (yellow form or pink MOST form) Yellow form placed in chart (order not valid for inpatient use);Pink MOST form placed in chart (order not valid for inpatient use)     Chief Complaint  Patient presents with  . Medical Management of Chronic Issues    HPI: Patient is a 86 y.o. female seen today for medical management of chronic diseases.   Patient has h/o Hypertension, Diabetes Mellitus, H/o CKD,, [place] back Pain and Arhtritis Patient [place] doing well in [place]  Chronic Back Pain Doing well with Norco and Neurontin C/o Some Left Heel pain at night sometime. Also Has Chronic Pain her Hands  Walks with the walker. Independent in her ADLS and IADSLS Twin son Out of state in [place] and [place]    Past Medical History:  Diagnosis Date  . Acute blood loss anemia   . Allergy   . Asthma   . AVM (arteriovenous malformation) of colon with hemorrhage   . Bursitis of right hip   . Cataract   . CKD (chronic kidney disease), stage III 11/04/2015  . COLONIC POLYPS 03/26/2005   Qualifier: Diagnosis of  By: Talbert Cage CMA ([place]), [place]    . DIVERTICULOSIS, COLON 03/26/2005   Qualifier: Diagnosis of  By: Talbert Cage CMA ([place]), June    . DVT (deep venous thrombosis), left 11/03/2015  . Ecchymosis 12/16/2015  . Hyperglycemia 12/16/2015  .  Hyperlipidemia   . Hypertension   . Multiple skin tears 12/16/2015  . Osteopenia   . Stasis edema of both lower extremities 11/04/2015  . Unstable gait 12/16/2015  . Urine incontinence 12/16/2015    Past Surgical History:  Procedure Laterality Date  . ABDOMINAL HYSTERECTOMY  2006   Dr. Maryelizabeth Rowan  . CATARACT EXTRACTION W/ INTRAOCULAR LENS IMPLANT Bilateral 1999/2003   Dr. Rutherford Guys  . COLONOSCOPY N/A 11/06/2015   Procedure: COLONOSCOPY;  Surgeon: Mauri Pole, MD;  Location: [facility];  Service: Endoscopy;  Laterality: N/A;  MAC if available, otherwise moderate sedation    Allergies  Allergen Reactions  . Epinephrine Nausea Only    Unknown.  Marland Kitchen Hydrocodone-Acetaminophen     REACTION: nausea  . Labetalol   . Procaine     Outpatient Encounter Medications as of 07/19/2019  Medication Sig  . acetaminophen (TYLENOL) 500 MG tablet Take 500 mg by mouth 2 (two) times daily.   . calcium carbonate (TUMS - DOSED IN MG ELEMENTAL CALCIUM) 500 MG chewable tablet Chew 2 tablets by mouth every morning.   . cholecalciferol (VITAMIN D) 1000 units tablet Take 1,000 Units by mouth daily.  . furosemide (LASIX) 20 MG tablet Take 1 tablet (20 mg total) by mouth daily.  Marland Kitchen gabapentin (NEURONTIN) 300 MG capsule Take 300 mg by mouth 2 (two) times daily.  Marland Kitchen HYDROcodone-acetaminophen (NORCO/VICODIN) 5-325 MG tablet Take one tablet by mouth three times  daily for pain  . loperamide (IMODIUM A-D) 2 MG tablet Take 4 mg by mouth as needed for diarrhea or loose stools. If 3 loose stools in 24 hours, hold all laxatives and stool softeners. Give Imodium AD (loperamide) 4 mg po initial dose, then 2 mg po after each loose stool X 48 hrs.(maximum of 24m in 24 hrs. If diarrhea continues, notify MD  . Multiple Vitamins-Minerals (ICAPS MV PO) Take 1 tablet by mouth daily.  . Multiple Vitamins-Minerals (MULTIVITAMIN WITH MINERALS) tablet Take 1 tablet by mouth daily.   .Marland Kitchennystatin (NYSTATIN) powder Apply to groin 2  times daily as needed   No facility-administered encounter medications on file as of 07/19/2019.    Review of Systems:  Review of Systems  Constitutional: Negative.   HENT: Negative.   Respiratory: Negative.   Cardiovascular: Positive for leg swelling.  Gastrointestinal: Negative.   Genitourinary: Negative.   Musculoskeletal: Positive for arthralgias, back pain, gait problem, joint swelling and myalgias.  Neurological: Positive for weakness.  Psychiatric/Behavioral: Negative.   All other systems reviewed and are negative.   Health Maintenance  Topic Date Due  . HEMOGLOBIN A1C  09/10/2017  . URINE MICROALBUMIN  07/07/2018  . OPHTHALMOLOGY EXAM  09/13/2019 (Originally 04/10/2018)  . FOOT EXAM  03/15/2020 (Originally 09/12/1935)  . PNA vac Low Risk Adult (2 of 2 - PCV13) 03/16/2020  . TETANUS/TDAP  01/05/2027  . INFLUENZA VACCINE  Completed  . DEXA SCAN  Completed    Physical Exam: Vitals:   07/19/19 1547  BP: 120/64  Pulse: 60  Resp: 20  Temp: 98 F (36.7 C)  SpO2: 92%  Weight: 161 lb 9.6 oz (73.3 kg)  Height: _0  (1.6 m)   Body mass index is 28.63 kg/m. Physical Exam  Constitutional: Oriented to person, place, and time. Well-developed and well-nourished.  HENT:  Head: Normocephalic.  Mouth/Throat: Oropharynx is clear and moist.  Eyes: Pupils are equal, round, and reactive to light.  Neck: Neck supple.  Cardiovascular: Normal rate and normal heart sounds.  No murmur heard. Pulmonary/Chest: Effort normal and breath sounds normal. No respiratory distress. No wheezes. She has no rales.  Abdominal: Soft. Bowel sounds are normal. No distension. There is no tenderness. There is no rebound.  Musculoskeletal: Mild Edema Left More then right Heel was fine with no Open Area Lymphadenopathy: none Neurological: Alert and oriented to person, place, and time. No Focal Deficits Skin: Skin is warm and dry.  Psychiatric: Normal mood and affect. Behavior is normal. Thought  content normal.    Labs reviewed: Basic Metabolic Panel: Recent Labs    12/21/18 0000  NA 141  K 4.2  CL 105  CO2 30  BUN 28*  CREATININE 1.2*  CALCIUM 9.3  TSH 1.73   Liver Function Tests: Recent Labs    12/21/18 0000  AST 20  ALT 11  ALKPHOS 45  PROT 6.0*  ALBUMIN 3.9   No results for input(s): LIPASE, AMYLASE in the last 8760 hours. No results for input(s): AMMONIA in the last 8760 hours. CBC: Recent Labs    12/21/18 0000  WBC 4.9  NEUTROABS 1,950  HGB 12.5  HCT 38  PLT 185   Lipid Panel: No results for input(s): CHOL, HDL, LDLCALC, TRIG, CHOLHDL, LDLDIRECT in the last 8760 hours. Lab Results  Component Value Date   HGBA1C 5.7 03/13/2017    Procedures since last visit: No results found.  Assessment/Plan Chronic diastolic congestive heart failure (HCC) With LE eDema On Lasix Repeat BMP  Osteoarthritis of spine with radiculopathy, lumbar region Stable on Norco and Neurontin  Stage 3a chronic kidney disease Creat stable Repeat BMP Type 2 diabetes mellitus /Prediabetes Follows with Opthalmology and Podiatrist Repeat A1C   Labs/tests ordered:  BMP,CBC, A1C, UA for Microalbumin PPSV vaccine  Next appt:  Visit date not found

## 2019-07-22 DIAGNOSIS — N200 Calculus of kidney: Secondary | ICD-10-CM | POA: Diagnosis not present

## 2019-07-22 DIAGNOSIS — Z79899 Other long term (current) drug therapy: Secondary | ICD-10-CM | POA: Diagnosis not present

## 2019-07-22 DIAGNOSIS — D649 Anemia, unspecified: Secondary | ICD-10-CM | POA: Diagnosis not present

## 2019-07-22 DIAGNOSIS — I10 Essential (primary) hypertension: Secondary | ICD-10-CM | POA: Diagnosis not present

## 2019-07-22 LAB — COMPREHENSIVE METABOLIC PANEL
Albumin: 3.9 (ref 3.5–5.0)
Calcium: 9.2 (ref 8.7–10.7)
Globulin: 2.2

## 2019-07-22 LAB — CBC AND DIFFERENTIAL
HCT: 39 (ref 36–46)
Hemoglobin: 12.2 (ref 12.0–16.0)
Neutrophils Absolute: 2361
Platelets: 183 (ref 150–399)
WBC: 5.2

## 2019-07-22 LAB — BASIC METABOLIC PANEL
BUN: 31 — AB (ref 4–21)
CO2: 27 — AB (ref 13–22)
Chloride: 106 (ref 99–108)
Creatinine: 1.3 — AB (ref 0.5–1.1)
Glucose: 77
Potassium: 4.2 (ref 3.4–5.3)
Sodium: 141 (ref 137–147)

## 2019-07-22 LAB — HEPATIC FUNCTION PANEL
ALT: 10 (ref 7–35)
AST: 21 (ref 13–35)
Alkaline Phosphatase: 45 (ref 25–125)
Bilirubin, Total: 0.5

## 2019-07-22 LAB — CBC: RBC: 4.36 (ref 3.87–5.11)

## 2019-08-09 ENCOUNTER — Other Ambulatory Visit: Payer: Self-pay | Admitting: *Deleted

## 2019-08-09 DIAGNOSIS — G8929 Other chronic pain: Secondary | ICD-10-CM

## 2019-08-09 DIAGNOSIS — M25511 Pain in right shoulder: Secondary | ICD-10-CM

## 2019-08-09 DIAGNOSIS — M25551 Pain in right hip: Secondary | ICD-10-CM

## 2019-08-09 MED ORDER — HYDROCODONE-ACETAMINOPHEN 5-325 MG PO TABS: ORAL_TABLET | ORAL | 0 refills | Status: DC

## 2019-08-09 NOTE — Telephone Encounter (Signed)
Received fax from [facility] °Pended Rx and sent to [facility] for approval.  °

## 2019-09-06 ENCOUNTER — Other Ambulatory Visit: Payer: Self-pay | Admitting: *Deleted

## 2019-09-06 DIAGNOSIS — G8929 Other chronic pain: Secondary | ICD-10-CM

## 2019-09-06 DIAGNOSIS — M25551 Pain in right hip: Secondary | ICD-10-CM

## 2019-09-06 MED ORDER — HYDROCODONE-ACETAMINOPHEN 5-325 MG PO TABS: ORAL_TABLET | ORAL | 0 refills | Status: DC

## 2019-09-06 NOTE — Telephone Encounter (Signed)
Received fax from [facility] for refill Pended Rx and sent to [facility] for approval.

## 2019-09-07 ENCOUNTER — Ambulatory Visit: Payer: Medicare Other | Admitting: Allergy and Immunology

## 2019-09-07 ENCOUNTER — Ambulatory Visit: Payer: Self-pay | Admitting: Allergy and Immunology

## 2019-09-21 ENCOUNTER — Ambulatory Visit (INDEPENDENT_AMBULATORY_CARE_PROVIDER_SITE_OTHER): Payer: Medicare Other | Admitting: Allergy and Immunology

## 2019-09-21 ENCOUNTER — Encounter: Payer: Self-pay | Admitting: Allergy and Immunology

## 2019-09-21 ENCOUNTER — Other Ambulatory Visit: Payer: Self-pay

## 2019-09-21 VITALS — BP 134/80 | HR 87 | Temp 97.4°F | Resp 17

## 2019-09-21 DIAGNOSIS — T50905D Adverse effect of unspecified drugs, medicaments and biological substances, subsequent encounter: Secondary | ICD-10-CM | POA: Diagnosis not present

## 2019-09-21 DIAGNOSIS — T4145XD Adverse effect of unspecified anesthetic, subsequent encounter: Secondary | ICD-10-CM | POA: Diagnosis not present

## 2019-09-21 DIAGNOSIS — Z889 Allergy status to unspecified drugs, medicaments and biological substances status: Secondary | ICD-10-CM | POA: Diagnosis not present

## 2019-09-21 DIAGNOSIS — R110 Nausea: Secondary | ICD-10-CM | POA: Diagnosis not present

## 2019-09-21 NOTE — Progress Notes (Signed)
New Patient Note  RE: Yolanda [name] MRN: [id number] DOB: 08/03/1925 Date of Office Visit: 09/21/2019  Referring provider: Ramon Dredge, DMD Primary care provider: [name], MD  Chief Complaint: History of Drug Allergy   History of present illness: Yolanda [name] is a 86 y.o. female seen today in consultation requested by Ramon Dredge, DMD.  She reports that when she was approximately 86 years old she had a dental procedure and afterwards was told to "keep away from epinephrine."  She does not recall having had a reaction only that she was told to avoid this medication.  More recently, after having been in the hospital she noticed that her medication allergy list included, not only epinephrine, but also lidocaine, mepivacaine, and procaine.  She thought this unusual because she has had multiple dental procedures with injected local anesthetics without adverse events.  In October 2019 she lost the crown of one of her molars and has an exposed root.  The exposed root does not bother her but the dentists that care for the individuals that her assisted living facility would like to address the issue and will require local anesthetic. Therefore, she is here today to be tested to local anesthetic agents and local anesthetic agents containing epinephrine.  Assessment and plan: History of drug allergy There have been 2 reported cases of hypersensitivity to an epinephrine preparation in local anesthetics which were found by skin tests for local anesthetics. Both patients had uncomfortable episodes to local anesthetics at dental treatment. In both cases, the skin tests showed positive reactions to 2% lidocaine with 1:80,000 epinephrine. Furthermore drug lymphocyte stimulation test revealed positive reaction to epinephrine hydrochloride, epinephrine bitartrate in case 1, whereas in case 2, the drug lymphocyte stimulation test showed positive response to epinephrine bitartrate. The patient's  history suggests low pre-test probability of lidocaine/epinephrine allergy. Today the patient had negative epicutaneous and intradermal skin tests to lidocaine and epinephrine at all delusions, including full strength, and was able to tolerate the subcutaneous challenge at all doses without adverse signs or symptoms.  Therefore, Yolanda [name] has the same risk of systemic reaction associated with injection of lidocaine and epinephrine as the general population.   Diagnostics: Lidocaine/epinephrine skin testing: Epicutaneous and intradermal injections were negative at all dilutions despite a positive histamine control. Lidocaine/epinephrine subcutaneous challenge: The patient was able to tolerate the challenge at all dilutions today without adverse signs or symptoms. Vital signs were stable throughout the challenge and observation period.    Physical examination: Blood pressure 132/78, pulse 89, temperature (!) 97.4 F (36.3 C), temperature source Temporal, resp. rate 20, SpO2 96 %.  General: Alert, interactive, in no acute distress. Neck: Supple without lymphadenopathy. Lungs: Clear to auscultation without wheezing, rhonchi or rales. CV: Normal S1, S2 without murmurs. Abdomen: Nondistended, nontender. Skin: Warm and dry, without lesions or rashes. Extremities:  No clubbing, cyanosis or edema. Neuro:   Grossly intact.  Review of systems:  Review of systems negative except as noted in HPI / PMHx or noted below: Review of Systems  Constitutional: Negative.   HENT: Negative.   Eyes: Negative.   Respiratory: Negative.   Cardiovascular: Negative.   Gastrointestinal: Negative.   Genitourinary: Negative.   Musculoskeletal: Negative.   Skin: Negative.   Neurological: Negative.   Endo/Heme/Allergies: Negative.   Psychiatric/Behavioral: Negative.     Past medical history:  Past Medical History:  Diagnosis Date  . Acute blood loss anemia   . Allergy   . Asthma   .  AVM (arteriovenous  malformation) of colon with hemorrhage   . Bursitis of right hip   . Cataract   . CKD (chronic kidney disease), stage III 11/04/2015  . COLONIC POLYPS 03/26/2005   Qualifier: Diagnosis of  By: Talbert Cage CMA ([place]), June    . DIVERTICULOSIS, COLON 03/26/2005   Qualifier: Diagnosis of  By: Talbert Cage CMA ([place]), June    . DVT (deep venous thrombosis), left 11/03/2015  . Ecchymosis 12/16/2015  . Hyperglycemia 12/16/2015  . Hyperlipidemia   . Hypertension   . Multiple skin tears 12/16/2015  . Osteopenia   . Stasis edema of both lower extremities 11/04/2015  . Unstable gait 12/16/2015  . Urine incontinence 12/16/2015    Past surgical history:  Past Surgical History:  Procedure Laterality Date  . ABDOMINAL HYSTERECTOMY  2006   Dr. Maryelizabeth Rowan  . CATARACT EXTRACTION W/ INTRAOCULAR LENS IMPLANT Bilateral 1999/2003   Dr. Rutherford Guys  . COLONOSCOPY N/A 11/06/2015   Procedure: COLONOSCOPY;  Surgeon: Mauri Pole, MD;  Location: [facility];  Service: Endoscopy;  Laterality: N/A;  MAC if available, otherwise moderate sedation    Family history: Family History  Problem Relation Age of Onset  . Heart attack Father 62    Social history: Social History   Socioeconomic History  . Marital status: Widowed    Spouse name: Not on file  . Number of children: 2  . Years of education: Not on file  . Highest education level: Not on file  Occupational History  . Occupation: retired Art gallery manager  Tobacco Use  . Smoking status: Never Smoker  . Smokeless tobacco: Never Used  Substance and Sexual Activity  . Alcohol use: No    Alcohol/week: 0.0 standard drinks  . Drug use: No  . Sexual activity: Never    Comment: was until a few years ago  Other Topics Concern  . Not on file  Social History Narrative   Lives alone in an retirement community. [place]   Walks with a walker.   NOK: 2 sons with shared POA (they live far away) - Shanon Brow Dintinfass would be first call.   Never smoked    Alcohol none   Exercise none   POA, Living Will, MOST             Social Determinants of Health   Financial Resource Strain:   . Difficulty of Paying Living Expenses:   Food Insecurity:   . Worried About Charity fundraiser in the Last Year:   . Arboriculturist in the Last Year:   Transportation Needs:   . Film/video editor (Medical):   Marland Kitchen Lack of Transportation (Non-Medical):   Physical Activity:   . Days of Exercise per Week:   . Minutes of Exercise per Session:   Stress:   . Feeling of Stress :   Social Connections:   . Frequency of Communication with Friends and Family:   . Frequency of Social Gatherings with Friends and Family:   . Attends Religious Services:   . Active Member of Clubs or Organizations:   . Attends Archivist Meetings:   Marland Kitchen Marital Status:   Intimate Partner Violence:   . Fear of Current or Ex-Partner:   . Emotionally Abused:   Marland Kitchen Physically Abused:   . Sexually Abused:     Environmental History: The patient lives in a 86 year old assisted living facility with laminate floors throughout and central air/heat.  There is no known mold/water damage  in the home.  There are no animals in the home.  She is a never smoker.  Current Outpatient Medications  Medication Sig Dispense Refill  . acetaminophen (TYLENOL) 500 MG tablet Take 500 mg by mouth 2 (two) times daily.     . calcium carbonate (TUMS - DOSED IN MG ELEMENTAL CALCIUM) 500 MG chewable tablet Chew 2 tablets by mouth every morning.     . cholecalciferol (VITAMIN D) 1000 units tablet Take 1,000 Units by mouth daily.    . furosemide (LASIX) 20 MG tablet Take 1 tablet (20 mg total) by mouth daily. 4 tablet 0  . gabapentin (NEURONTIN) 300 MG capsule Take 300 mg by mouth 2 (two) times daily.    Marland Kitchen HYDROcodone-acetaminophen (NORCO/VICODIN) 5-325 MG tablet Take one tablet by mouth three times daily for pain 90 tablet 0  . loperamide (IMODIUM A-D) 2 MG tablet Take 4 mg by mouth as needed for  diarrhea or loose stools. If 3 loose stools in 24 hours, hold all laxatives and stool softeners. Give Imodium AD (loperamide) 4 mg po initial dose, then 2 mg po after each loose stool X 48 hrs.(maximum of 55m in 24 hrs. If diarrhea continues, notify MD    . Multiple Vitamins-Minerals (ICAPS MV PO) Take 1 tablet by mouth daily.    . Multiple Vitamins-Minerals (MULTIVITAMIN WITH MINERALS) tablet Take 1 tablet by mouth daily.     .Marland Kitchennystatin (NYSTATIN) powder Apply to groin 2 times daily as needed     No current facility-administered medications for this visit.    Known medication allergies: Allergies  Allergen Reactions  . Epinephrine Nausea Only    Unknown.  .Marland KitchenHydrocodone-Acetaminophen     REACTION: nausea  . Labetalol   . Procaine     I appreciate the opportunity to take part in Yolanda [name]'s care. Please do not hesitate to contact me with questions.  Sincerely,   R. CEdgar Frisk MD

## 2019-09-21 NOTE — Patient Instructions (Addendum)
History of drug allergy There have been 2 reported cases of hypersensitivity to an epinephrine preparation in local anesthetics which were found by skin tests for local anesthetics. Both patients had uncomfortable episodes to local anesthetics at dental treatment. In both cases, the skin tests showed positive reactions to 2% lidocaine with 1:80,000 epinephrine. Furthermore drug lymphocyte stimulation test revealed positive reaction to epinephrine hydrochloride, epinephrine bitartrate in case 1, whereas in case 2, the drug lymphocyte stimulation test showed positive response to epinephrine bitartrate. The patient's history suggests low pre-test probability of lidocaine/epinephrine allergy. Today the patient had negative epicutaneous and intradermal skin tests to lidocaine and epinephrine at all delusions, including full strength, and was able to tolerate the subcutaneous challenge at all doses without adverse signs or symptoms.  Therefore, Yolanda [name] has the same risk of systemic reaction associated with injection of lidocaine and epinephrine as the general population.

## 2019-09-21 NOTE — Assessment & Plan Note (Signed)
There have been 2 reported cases of hypersensitivity to an epinephrine preparation in local anesthetics which were found by skin tests for local anesthetics. Both patients had uncomfortable episodes to local anesthetics at dental treatment. In both cases, the skin tests showed positive reactions to 2% lidocaine with 1:80,000 epinephrine. Furthermore drug lymphocyte stimulation test revealed positive reaction to epinephrine hydrochloride, epinephrine bitartrate in case 1, whereas in case 2, the drug lymphocyte stimulation test showed positive response to epinephrine bitartrate. The patient's history suggests low pre-test probability of lidocaine/epinephrine allergy. Today the patient had negative epicutaneous and intradermal skin tests to lidocaine and epinephrine at all delusions, including full strength, and was able to tolerate the subcutaneous challenge at all doses without adverse signs or symptoms.  Therefore, Yolanda [name] has the same risk of systemic reaction associated with injection of lidocaine and epinephrine as the general population.

## 2019-10-06 ENCOUNTER — Other Ambulatory Visit: Payer: Self-pay | Admitting: *Deleted

## 2019-10-06 DIAGNOSIS — M25511 Pain in right shoulder: Secondary | ICD-10-CM

## 2019-10-06 DIAGNOSIS — M5416 Radiculopathy, lumbar region: Secondary | ICD-10-CM

## 2019-10-06 DIAGNOSIS — M25551 Pain in right hip: Secondary | ICD-10-CM

## 2019-10-06 NOTE — Telephone Encounter (Signed)
Received fax from [facility] Requesting refill Pended Rx and sent to Dr. Lyndel Safe for approval.

## 2019-10-08 MED ORDER — HYDROCODONE-ACETAMINOPHEN 5-325 MG PO TABS: ORAL_TABLET | ORAL | 0 refills | Status: DC

## 2019-10-15 ENCOUNTER — Encounter: Payer: Self-pay | Admitting: Nurse Practitioner

## 2019-10-15 ENCOUNTER — Non-Acute Institutional Stay: Payer: Medicare Other | Admitting: Nurse Practitioner

## 2019-10-15 DIAGNOSIS — I5032 Chronic diastolic (congestive) heart failure: Secondary | ICD-10-CM

## 2019-10-15 DIAGNOSIS — N1831 Chronic kidney disease, stage 3a: Secondary | ICD-10-CM | POA: Diagnosis not present

## 2019-10-15 DIAGNOSIS — E1121 Type 2 diabetes mellitus with diabetic nephropathy: Secondary | ICD-10-CM | POA: Diagnosis not present

## 2019-10-15 DIAGNOSIS — M4726 Other spondylosis with radiculopathy, lumbar region: Secondary | ICD-10-CM

## 2019-10-15 DIAGNOSIS — E1122 Type 2 diabetes mellitus with diabetic chronic kidney disease: Secondary | ICD-10-CM

## 2019-10-15 NOTE — Assessment & Plan Note (Signed)
Lower back, right hip, chronic, continue Gabapentin 300mg  tid, Norco 5/325mg  tid, Tylenol 500mg  bid, adding Tylenol 500mg  qhs to help early am aches/pain.

## 2019-10-15 NOTE — Assessment & Plan Note (Signed)
Compensated, continue Furosemide.  

## 2019-10-15 NOTE — Progress Notes (Addendum)
Location:   [place] Room Number: 23 Place of Service:  [facility] [phone]) Provider:  Jaonna Word, NP   Patient Care Team: [name], MD as PCP - General (Internal Medicine) Rutherford Guys, MD as Consulting Physician (Ophthalmology) [name], MD as Consulting Physician (Internal Medicine)  Extended Emergency Contact Information Primary Emergency Contact: Harrower,Leonard Address: [address] [place] [address] Johnnette Litter of [place] Phone: [phone] Work Phone: [phone] Mobile Phone: [phone] Relation: Son Secondary Emergency Contact: [place] of [place] Phone: [phone] Relation: Friend  Code Status:  DNR Goals of care: Advanced Directive information Advanced Directives 10/15/2019  Does Patient Have a Medical Advance Directive? Yes  Type of Paramedic of [place];Living will;Out of facility DNR (pink MOST or yellow form)  Does patient want to make changes to medical advance directive? No - Patient declined  Copy of [place] in Chart? Yes - validated most recent copy scanned in chart (See row information)  Would patient like information on creating a medical advance directive? -  Pre-existing out of facility DNR order (yellow form or pink MOST form) -     Chief Complaint  Patient presents with  . Medical Management of Chronic Issues    Routine Visit.  Marland Kitchen Health Maintenance    Discuss the need for Hemoglobin A1C, Urine Microalbumin, and Eye Exam.    HPI:  Pt is a 86 y.o. female seen today for medical management of chronic diseases.  [place] in   [place] [facility], ambulates with walker. OA, pain in the right hip, worsened in morning, on Gabapentin 350m bid tid, Tylenol 5043mbid, Norco 5/32570mid. CHF/edema, compensated, on Furosemide 61m28m. Diet controlled T2DM   Past Medical History:  Diagnosis Date  . Acute blood loss anemia     . Allergy   . Asthma   . AVM (arteriovenous malformation) of colon with hemorrhage   . Bursitis of right hip   . Cataract   . CKD (chronic kidney disease), stage III 11/04/2015  . COLONIC POLYPS 03/26/2005   Qualifier: Diagnosis of  By: [place]bert Cage ([place]    . DIVERTICULOSIS, COLON 03/26/2005   Qualifier: Diagnosis of  By: Mc[place]bert Cage ([place]    . DVT (deep venous thrombosis), left 11/03/2015  . Ecchymosis 12/16/2015  . Hyperglycemia 12/16/2015  . Hyperlipidemia   . Hypertension   . Multiple skin tears 12/16/2015  . Osteopenia   . Stasis edema of both lower extremities 11/04/2015  . Unstable gait 12/16/2015  . Urine incontinence 12/16/2015   Past Surgical History:  Procedure Laterality Date  . ABDOMINAL HYSTERECTOMY  2006   Dr. Wes Maryelizabeth RowanCATARACT EXTRACTION W/ INTRAOCULAR LENS IMPLANT Bilateral 1999/2003   Dr. MarkRutherford GuysCOLONOSCOPY N/A 11/06/2015   Procedure: COLONOSCOPY;  Surgeon: KaviMauri Pole;  Location: [facility];  Service: Endoscopy;  Laterality: N/A;  MAC if available, otherwise moderate sedation    Allergies  Allergen Reactions  . Epinephrine Nausea Only    Unknown.  . HyMarland Kitchenrocodone-Acetaminophen     REACTION: nausea  . Labetalol   . Procaine     Allergies as of 10/15/2019      Reactions   Epinephrine Nausea Only   Unknown.   Hydrocodone-acetaminophen    REACTION: nausea   Labetalol    Procaine       Medication List  Accurate as of October 15, 2019 11:46 AM. If you have any questions, ask your nurse or doctor.        acetaminophen 500 MG tablet Commonly known as: TYLENOL Take 500 mg by mouth 2 (two) times daily.   calcium carbonate 500 MG chewable tablet Commonly known as: TUMS - dosed in mg elemental calcium Chew 2 tablets by mouth every morning.   cholecalciferol 1000 units tablet Commonly known as: VITAMIN D Take 1,000 Units by mouth daily.   furosemide 20 MG tablet Commonly known as: LASIX Take 1 tablet (20 mg  total) by mouth daily.   gabapentin 300 MG capsule Commonly known as: NEURONTIN Take 300 mg by mouth 2 (two) times daily.   HYDROcodone-acetaminophen 5-325 MG tablet Commonly known as: NORCO/VICODIN Take one tablet by mouth three times daily for pain   loperamide 2 MG tablet Commonly known as: IMODIUM A-D Take 4 mg by mouth as needed for diarrhea or loose stools. If 3 loose stools in 24 hours, hold all laxatives and stool softeners. Give Imodium AD (loperamide) 4 mg po initial dose, then 2 mg po after each loose stool X 48 hrs.(maximum of 13m in 24 hrs. If diarrhea continues, notify MD   multivitamin with minerals tablet Take 1 tablet by mouth daily.   ICAPS MV PO Take 1 tablet by mouth daily.   nystatin powder Generic drug: nystatin Apply to groin 2 times daily as needed       Review of Systems  Constitutional: Negative for activity change, fever and unexpected weight change.  HENT: Positive for hearing loss. Negative for congestion and voice change.   Respiratory: Negative for cough, shortness of breath and wheezing.   Cardiovascular: Positive for leg swelling. Negative for chest pain and palpitations.  Gastrointestinal: Negative for abdominal pain and constipation.  Genitourinary: Negative for difficulty urinating, dysuria, frequency and urgency.  Musculoskeletal: Positive for arthralgias, back pain and gait problem.       Lower back, right hip  Skin: Negative for color change.  Neurological: Negative for speech difficulty, weakness and light-headedness.       Memory lapses.   Psychiatric/Behavioral: Negative for behavioral problems and sleep disturbance. The patient is not nervous/anxious.     Immunization History  Administered Date(s) Administered  . DTaP 05/13/2005  . Influenza, High Dose Seasonal PF 02/23/2019  . Influenza-Unspecified 02/11/2015, 02/22/2016, 02/19/2017, 02/16/2018  . Moderna SARS-COVID-2 Vaccination 05/17/2019, 06/14/2019  . PPD Test  12/08/2015, 12/22/2015  . Pneumococcal Conjugate-13 05/13/2014  . Pneumococcal Polysaccharide-23 05/13/2014, 03/17/2019  . Td 12/11/2005  . Tdap 01/04/2017  . Zoster 05/13/2014   Pertinent  Health Maintenance Due  Topic Date Due  . HEMOGLOBIN A1C  09/10/2017  . OPHTHALMOLOGY EXAM  04/10/2018  . URINE MICROALBUMIN  07/07/2018  . FOOT EXAM  03/15/2020 (Originally 09/12/1935)  . INFLUENZA VACCINE  12/12/2019  . PNA vac Low Risk Adult (2 of 2 - PCV13) 03/16/2020  . DEXA SCAN  Completed   Fall Risk  01/05/2018 12/30/2016 11/03/2015  Falls in the past year? No No No   Functional Status Survey:    Vitals:   10/15/19 1047  BP: 130/66  Pulse: 63  Resp: 18  Temp: 97.7 F (36.5 C)  SpO2: 95%  Weight: 158 lb 9.6 oz (71.9 kg)  Height: _0  (1.6 m)   Body mass index is 28.09 kg/m. Physical Exam Vitals and nursing note reviewed.  Constitutional:      General: She is not in acute distress.  Appearance: Normal appearance. She is not ill-appearing, toxic-appearing or diaphoretic.     Comments: Over weight  HENT:     Mouth/Throat:     Mouth: Mucous membranes are moist.  Eyes:     Extraocular Movements: Extraocular movements intact.     Conjunctiva/sclera: Conjunctivae normal.     Pupils: Pupils are equal, round, and reactive to light.  Cardiovascular:     Rate and Rhythm: Normal rate and regular rhythm.     Heart sounds: No murmur.  Pulmonary:     Breath sounds: No rales.     Comments: Bibasilar rales.  Abdominal:     Palpations: Abdomen is soft.     Tenderness: There is no abdominal tenderness.  Musculoskeletal:     Cervical back: Normal range of motion and neck supple.     Right lower leg: Edema present.     Left lower leg: Edema present.     Comments: Trace edema BLE, ambulates with walker.   Skin:    General: Skin is warm and dry.  Neurological:     General: No focal deficit present.     Mental Status: She is alert and oriented to person, place, and time. Mental  status is at baseline.     Motor: No weakness.     Gait: Gait abnormal.  Psychiatric:        Mood and Affect: Mood normal.        Behavior: Behavior normal.        Thought Content: Thought content normal.        Judgment: Judgment normal.     Labs reviewed: Recent Labs    12/21/18 0000 07/22/19 0000  NA 141 141  K 4.2 4.2  CL 105 106  CO2 30 27*  BUN 28* 31*  CREATININE 1.2* 1.3*  CALCIUM 9.3 9.2   Recent Labs    12/21/18 0000 07/22/19 0000  AST 20 21  ALT 11 10  ALKPHOS 45 45  PROT 6.0*  --   ALBUMIN 3.9 3.9   Recent Labs    12/21/18 0000 07/22/19 0000  WBC 4.9 5.2  NEUTROABS 1,950 2,361  HGB 12.5 12.2  HCT 38 39  PLT 185 183   Lab Results  Component Value Date   TSH 1.73 12/21/2018   Lab Results  Component Value Date   HGBA1C 5.7 03/13/2017   Lab Results  Component Value Date   CHOL 211 (A) 06/16/2017   HDL 74 (A) 06/16/2017   LDLCALC 117 06/16/2017   TRIG 96 06/16/2017    Significant Diagnostic Results in last 30 days:  No results found.  Assessment/Plan Type 2 diabetes mellitus with diabetic chronic kidney disease ([place]) Addressed eye exam 07/2019,  updated Hgb a1c 5.7 07/22/19, micro albumin 0.8/8 07/19/19  Osteoarthritis of spine with radiculopathy, lumbar region Lower back, right hip, chronic, continue Gabapentin 36m tid, Norco 5/3268mtid, Tylenol 50041mid, adding Tylenol 500m79ms to help early am aches/pain.   Chronic diastolic congestive heart failure (HCC) Compensated, continue Furosemide     Family/ staff Communication: plan of care reviewed with the patient and charge nurse.   Labs/tests ordered:  none  Time spend 40 minutes.

## 2019-10-15 NOTE — Assessment & Plan Note (Addendum)
Addressed eye exam 07/2019,  updated Hgb a1c 5.7 07/22/19, micro albumin 0.8/8 07/19/19

## 2019-12-07 ENCOUNTER — Other Ambulatory Visit: Payer: Self-pay | Admitting: *Deleted

## 2019-12-07 DIAGNOSIS — G8929 Other chronic pain: Secondary | ICD-10-CM

## 2019-12-07 DIAGNOSIS — M25551 Pain in right hip: Secondary | ICD-10-CM

## 2019-12-07 MED ORDER — HYDROCODONE-ACETAMINOPHEN 5-325 MG PO TABS: ORAL_TABLET | ORAL | 0 refills | Status: DC

## 2019-12-07 NOTE — Telephone Encounter (Signed)
Received refill Request from [facility].  Pended Rx and sent to [facility] for approval.

## 2020-01-05 ENCOUNTER — Other Ambulatory Visit: Payer: Self-pay

## 2020-01-05 DIAGNOSIS — G8929 Other chronic pain: Secondary | ICD-10-CM

## 2020-01-05 DIAGNOSIS — M25551 Pain in right hip: Secondary | ICD-10-CM

## 2020-01-05 MED ORDER — HYDROCODONE-ACETAMINOPHEN 5-325 MG PO TABS: ORAL_TABLET | ORAL | 0 refills | Status: DC

## 2020-01-05 NOTE — Telephone Encounter (Addendum)
Fax received from [facility] for [employer] 5/325mg  tablet take one 3 times daily. Patient has 8 left. Medication pended and sent to Dr. Lyndel Safe for approval.

## 2020-01-27 ENCOUNTER — Encounter: Payer: Self-pay | Admitting: Internal Medicine

## 2020-01-27 ENCOUNTER — Non-Acute Institutional Stay: Payer: Medicare Other | Admitting: Internal Medicine

## 2020-01-27 DIAGNOSIS — I5032 Chronic diastolic (congestive) heart failure: Secondary | ICD-10-CM | POA: Diagnosis not present

## 2020-01-27 DIAGNOSIS — N1831 Chronic kidney disease, stage 3a: Secondary | ICD-10-CM | POA: Diagnosis not present

## 2020-01-27 DIAGNOSIS — E1121 Type 2 diabetes mellitus with diabetic nephropathy: Secondary | ICD-10-CM | POA: Diagnosis not present

## 2020-01-27 DIAGNOSIS — M4726 Other spondylosis with radiculopathy, lumbar region: Secondary | ICD-10-CM | POA: Diagnosis not present

## 2020-01-27 NOTE — Progress Notes (Signed)
Location:    [facility] Room Number: 23 Place of Service:  ALF [phone]) Provider:  Veleta Miners MD  [name], MD  Patient Care Team: [name], MD as PCP - General (Internal Medicine) Rutherford Guys, MD as Consulting Physician (Ophthalmology) [name], MD as Consulting Physician (Internal Medicine)  Extended Emergency Contact Information Primary Emergency Contact: Botero,Leonard Address: [address] [place] [address] Johnnette Litter of [place] Phone: [phone] Work Phone: [phone] Mobile Phone: [phone] Relation: Son Secondary Emergency Contact: Gerarda Fraction States of [place] Phone: [phone] Relation: Friend  Code Status:  DNR Goals of care: Advanced Directive information Advanced Directives 10/15/2019  Does Patient Have a Medical Advance Directive? Yes  Type of Paramedic of [place];Living will;Out of facility DNR (pink MOST or yellow form)  Does patient want to make changes to medical advance directive? No - Patient declined  Copy of [place] in Chart? Yes - validated most recent copy scanned in chart (See row information)  Would patient like information on creating a medical advance directive? -  Pre-existing out of facility DNR order (yellow form or pink MOST form) -     Chief Complaint  Patient presents with  . Medical Management of Chronic Issues    HPI:  Pt is a 86 y.o. female seen today for medical management of chronic diseases.    Patient has h/o Hypertension, Diabetes Mellitus, H/o CKD,, Low back Pain and Arhtritis  Lives in [place] with her walker No Nursing Issues Weight Stable Independent in her ADLS and IADSLS Twin son Out of state in [place] and [place]  Past Medical History:  Diagnosis Date  . Acute blood loss anemia   . Allergy   . Asthma   . AVM (arteriovenous malformation) of colon with hemorrhage     . Bursitis of right hip   . Cataract   . CKD (chronic kidney disease), stage III 11/04/2015  . COLONIC POLYPS 03/26/2005   Qualifier: Diagnosis of  By: Talbert Cage CMA ([place]), June    . DIVERTICULOSIS, COLON 03/26/2005   Qualifier: Diagnosis of  By: Talbert Cage CMA ([place]), June    . DVT (deep venous thrombosis), left 11/03/2015  . Ecchymosis 12/16/2015  . Hyperglycemia 12/16/2015  . Hyperlipidemia   . Hypertension   . Multiple skin tears 12/16/2015  . Osteopenia   . Stasis edema of both lower extremities 11/04/2015  . Unstable gait 12/16/2015  . Urine incontinence 12/16/2015   Past Surgical History:  Procedure Laterality Date  . ABDOMINAL HYSTERECTOMY  2006   Dr. Maryelizabeth Rowan  . CATARACT EXTRACTION W/ INTRAOCULAR LENS IMPLANT Bilateral 1999/2003   Dr. Rutherford Guys  . COLONOSCOPY N/A 11/06/2015   Procedure: COLONOSCOPY;  Surgeon: Mauri Pole, MD;  Location: [facility];  Service: Endoscopy;  Laterality: N/A;  MAC if available, otherwise moderate sedation    Allergies  Allergen Reactions  . Epinephrine Nausea Only    Unknown.  Marland Kitchen Hydrocodone-Acetaminophen     REACTION: nausea  . Labetalol   . Procaine     Allergies as of 01/27/2020      Reactions   Epinephrine Nausea Only   Unknown.   Hydrocodone-acetaminophen    REACTION: nausea   Labetalol    Procaine       Medication List       Accurate as of January 27, 2020  3:25 PM. If you  have any questions, ask your nurse or doctor.        acetaminophen 500 MG tablet Commonly known as: TYLENOL Take 500 mg by mouth 2 (two) times daily.   calcium carbonate 500 MG chewable tablet Commonly known as: TUMS - dosed in mg elemental calcium Chew 2 tablets by mouth every morning.   cholecalciferol 1000 units tablet Commonly known as: VITAMIN D Take 1,000 Units by mouth daily.   furosemide 20 MG tablet Commonly known as: LASIX Take 1 tablet (20 mg total) by mouth daily.   gabapentin 300 MG capsule Commonly known as:  NEURONTIN Take 300 mg by mouth 2 (two) times daily.   HYDROcodone-acetaminophen 5-325 MG tablet Commonly known as: NORCO/VICODIN Take one tablet by mouth three times daily for pain   loperamide 2 MG tablet Commonly known as: IMODIUM A-D Take 4 mg by mouth as needed for diarrhea or loose stools. If 3 loose stools in 24 hours, hold all laxatives and stool softeners. Give Imodium AD (loperamide) 4 mg po initial dose, then 2 mg po after each loose stool X 48 hrs.(maximum of 41m in 24 hrs. If diarrhea continues, notify MD   multivitamin with minerals tablet Take 1 tablet by mouth daily.   ICAPS MV PO Take 1 tablet by mouth daily.   nystatin powder Generic drug: nystatin Apply to groin 2 times daily as needed       Review of Systems  Review of Systems  Constitutional: Negative for activity change, appetite change, chills, diaphoresis, fatigue and fever.  HENT: Negative for mouth sores, postnasal drip, rhinorrhea, sinus pain and sore throat.   Respiratory: Negative for apnea, cough, chest tightness, shortness of breath and wheezing.   Cardiovascular: Negative for chest pain, palpitations and leg swelling.  Gastrointestinal: Negative for abdominal distention, abdominal pain, constipation, diarrhea, nausea and vomiting.  Genitourinary: Negative for dysuria and frequency.  Musculoskeletal: Negative for arthralgias, joint swelling and myalgias.  Skin: Negative for rash.  Neurological: Negative for dizziness, syncope, weakness, light-headedness and numbness.  Psychiatric/Behavioral: Negative for behavioral problems, confusion and sleep disturbance.     Immunization History  Administered Date(s) Administered  . DTaP 05/13/2005  . Influenza, High Dose Seasonal PF 02/23/2019  . Influenza-Unspecified 02/11/2015, 02/22/2016, 02/19/2017, 02/16/2018  . Moderna SARS-COVID-2 Vaccination 05/17/2019, 06/14/2019  . PPD Test 12/08/2015, 12/22/2015  . Pneumococcal Conjugate-13 05/13/2014  .  Pneumococcal Polysaccharide-23 05/13/2014, 03/17/2019  . Td 12/11/2005  . Tdap 01/04/2017  . Zoster 05/13/2014   Pertinent  Health Maintenance Due  Topic Date Due  . HEMOGLOBIN A1C  09/10/2017  . OPHTHALMOLOGY EXAM  04/10/2018  . URINE MICROALBUMIN  07/07/2018  . INFLUENZA VACCINE  12/12/2019  . FOOT EXAM  03/15/2020 (Originally 09/12/1935)  . DEXA SCAN  Completed  . PNA vac Low Risk Adult  Completed   Fall Risk  01/05/2018 12/30/2016 11/03/2015  Falls in the past year? No No No   Functional Status Survey:    Vitals:   01/27/20 1523  BP: 127/66  Pulse: 61  Resp: 18  Temp: 97.7 F (36.5 C)  SpO2: 92%  Weight: 159 lb 9.6 oz (72.4 kg)  Height: _0  (1.6 m)   Body mass index is 28.27 kg/m. Physical Exam  Constitutional: Oriented to person, place, and time. Well-developed and well-nourished.  HENT:  Head: Normocephalic.  Mouth/Throat: Oropharynx is clear and moist.  Eyes: Pupils are equal, round, and reactive to light.  Neck: Neck supple.  Cardiovascular: Normal rate and normal heart sounds.  No murmur  heard. Pulmonary/Chest: Effort normal and breath sounds normal. No respiratory distress. No wheezes. She has no rales.  Abdominal: Soft. Bowel sounds are normal. No distension. There is no tenderness. There is no rebound.  Musculoskeletal: Mild edema.  Lymphadenopathy: none Neurological: Alert and oriented to person, place, and time.  Skin: Skin is warm and dry.  Psychiatric: Normal mood and affect. Behavior is normal. Thought content normal.    Labs reviewed: Recent Labs    07/22/19 0000  NA 141  K 4.2  CL 106  CO2 27*  BUN 31*  CREATININE 1.3*  CALCIUM 9.2   Recent Labs    07/22/19 0000  AST 21  ALT 10  ALKPHOS 45  ALBUMIN 3.9   Recent Labs    07/22/19 0000  WBC 5.2  NEUTROABS 2,361  HGB 12.2  HCT 39  PLT 183   Lab Results  Component Value Date   TSH 1.73 12/21/2018   Lab Results  Component Value Date   HGBA1C 5.7 03/13/2017   Lab  Results  Component Value Date   CHOL 211 (A) 06/16/2017   HDL 74 (A) 06/16/2017   LDLCALC 117 06/16/2017   TRIG 96 06/16/2017    Significant Diagnostic Results in last 30 days:  No results found.  Assessment/Plan Chronic diastolic congestive heart failure (HCC) On Lasix Repeat BMP Osteoarthritis of spine with radiculopathy, lumbar region Continue Neurontin and Norco Type 2 diabetes mellitus with stage 3a chronic kidney disease, without long-term current use of insulin (HCC) Repeat A1C Stage 3a chronic kidney disease Repeat CMP     Family/ staff Communication:   Labs/tests ordered:  CBC,CMP,A1C,TSH

## 2020-01-31 DIAGNOSIS — E119 Type 2 diabetes mellitus without complications: Secondary | ICD-10-CM | POA: Diagnosis not present

## 2020-01-31 DIAGNOSIS — D649 Anemia, unspecified: Secondary | ICD-10-CM | POA: Diagnosis not present

## 2020-01-31 DIAGNOSIS — E039 Hypothyroidism, unspecified: Secondary | ICD-10-CM | POA: Diagnosis not present

## 2020-01-31 LAB — CBC AND DIFFERENTIAL
HCT: 41 (ref 36–46)
Hemoglobin: 13.3 (ref 12.0–16.0)
Neutrophils Absolute: 2536
Platelets: 196 (ref 150–399)
WBC: 5.5

## 2020-01-31 LAB — BASIC METABOLIC PANEL
BUN: 25 — AB (ref 4–21)
CO2: 29 — AB (ref 13–22)
Chloride: 107 (ref 99–108)
Creatinine: 1.2 — AB (ref 0.5–1.1)
Glucose: 80
Potassium: 4.4 (ref 3.4–5.3)
Sodium: 144 (ref 137–147)

## 2020-01-31 LAB — HEPATIC FUNCTION PANEL
ALT: 8 (ref 7–35)
AST: 20 (ref 13–35)
Alkaline Phosphatase: 46 (ref 25–125)
Bilirubin, Total: 0.6

## 2020-01-31 LAB — HEMOGLOBIN A1C: Hemoglobin A1C: 5.7

## 2020-01-31 LAB — CBC: RBC: 4.59 (ref 3.87–5.11)

## 2020-01-31 LAB — COMPREHENSIVE METABOLIC PANEL
Albumin: 3.9 (ref 3.5–5.0)
Calcium: 9.5 (ref 8.7–10.7)
Globulin: 2.5

## 2020-01-31 LAB — TSH: TSH: 2.17 (ref 0.41–5.90)

## 2020-02-07 ENCOUNTER — Other Ambulatory Visit: Payer: Self-pay | Admitting: *Deleted

## 2020-02-07 DIAGNOSIS — G8929 Other chronic pain: Secondary | ICD-10-CM

## 2020-02-07 DIAGNOSIS — M25511 Pain in right shoulder: Secondary | ICD-10-CM

## 2020-02-07 DIAGNOSIS — M25551 Pain in right hip: Secondary | ICD-10-CM

## 2020-02-07 MED ORDER — HYDROCODONE-ACETAMINOPHEN 5-325 MG PO TABS: ORAL_TABLET | ORAL | 0 refills | Status: DC

## 2020-02-07 NOTE — Telephone Encounter (Signed)
Received refill Request from [facility] °Pended Rx and sent to Dr. Gupta for approval.  °

## 2020-02-29 ENCOUNTER — Encounter: Payer: Self-pay | Admitting: Internal Medicine

## 2020-02-29 DIAGNOSIS — E119 Type 2 diabetes mellitus without complications: Secondary | ICD-10-CM | POA: Diagnosis not present

## 2020-02-29 DIAGNOSIS — Z961 Presence of intraocular lens: Secondary | ICD-10-CM | POA: Diagnosis not present

## 2020-02-29 DIAGNOSIS — H524 Presbyopia: Secondary | ICD-10-CM | POA: Diagnosis not present

## 2020-02-29 DIAGNOSIS — H5213 Myopia, bilateral: Secondary | ICD-10-CM | POA: Diagnosis not present

## 2020-02-29 DIAGNOSIS — H52203 Unspecified astigmatism, bilateral: Secondary | ICD-10-CM | POA: Diagnosis not present

## 2020-02-29 LAB — HM DIABETES EYE EXAM

## 2020-03-06 ENCOUNTER — Other Ambulatory Visit: Payer: Self-pay

## 2020-03-06 DIAGNOSIS — M25551 Pain in right hip: Secondary | ICD-10-CM

## 2020-03-06 DIAGNOSIS — G8929 Other chronic pain: Secondary | ICD-10-CM

## 2020-03-06 NOTE — Telephone Encounter (Signed)
Refill request received from [facility] for Norco 5/325 mg atke one tablet three times a day as needed for pain. Patient has 7 tablets left.

## 2020-03-07 MED ORDER — HYDROCODONE-ACETAMINOPHEN 5-325 MG PO TABS: ORAL_TABLET | ORAL | 0 refills | Status: DC

## 2020-04-04 ENCOUNTER — Other Ambulatory Visit: Payer: Self-pay | Admitting: *Deleted

## 2020-04-04 DIAGNOSIS — M25551 Pain in right hip: Secondary | ICD-10-CM

## 2020-04-04 DIAGNOSIS — G8929 Other chronic pain: Secondary | ICD-10-CM

## 2020-04-04 NOTE — Telephone Encounter (Signed)
Received refill Request from [facility] Pended Rx and sent to Dr. Gupta for approval.  

## 2020-04-05 MED ORDER — HYDROCODONE-ACETAMINOPHEN 5-325 MG PO TABS: ORAL_TABLET | ORAL | 0 refills | Status: DC

## 2020-04-18 ENCOUNTER — Encounter: Payer: Self-pay | Admitting: Nurse Practitioner

## 2020-04-18 ENCOUNTER — Non-Acute Institutional Stay: Payer: Medicare Other | Admitting: Nurse Practitioner

## 2020-04-18 DIAGNOSIS — N1831 Chronic kidney disease, stage 3a: Secondary | ICD-10-CM

## 2020-04-18 DIAGNOSIS — I5032 Chronic diastolic (congestive) heart failure: Secondary | ICD-10-CM | POA: Diagnosis not present

## 2020-04-18 DIAGNOSIS — E1122 Type 2 diabetes mellitus with diabetic chronic kidney disease: Secondary | ICD-10-CM

## 2020-04-18 DIAGNOSIS — M159 Polyosteoarthritis, unspecified: Secondary | ICD-10-CM

## 2020-04-18 NOTE — Progress Notes (Signed)
Location:    [facility] Room Number: 23 Place of Service:  ALF [phone]) Provider: Arna Snipe Tanishka Drolet NP  Mahlon Gammon, MD  Patient Care Team: Mahlon Gammon, MD as PCP - General (Internal Medicine) Jethro Bolus, MD as Consulting Physician (Ophthalmology) Mahlon Gammon, MD as Consulting Physician (Internal Medicine)  Extended Emergency Contact Information Primary Emergency Contact: Crumrine,Leonard Address: [address]          [place], [place] [address] Darden Amber of [place] Home Phone: [phone] Work Phone: [phone] Mobile Phone: [phone] Relation: Son Secondary Emergency Contact: Starling Manns States of [place] Home Phone: [phone] Relation: Friend  Code Status:  DNR Goals of care: Advanced Directive information Advanced Directives 10/15/2019  Does Patient Have a Medical Advance Directive? Yes  Type of Estate agent of [place];Living will;Out of facility DNR (pink MOST or yellow form)  Does patient want to make changes to medical advance directive? No - Patient declined  Copy of Healthcare Power of Attorney in Chart? Yes - validated most recent copy scanned in chart (See row information)  Would patient like information on creating a medical advance directive? -  Pre-existing out of facility DNR order (yellow form or pink MOST form) -     Chief Complaint  Patient presents with  . Medical Management of Chronic Issues    HPI:  Pt is a 86 y.o. female seen today for medical management of chronic diseases.    The patient [place] in   [place] [facility], ambulates with walker.   OA, pain in the right hip, worsened in morning, on Gabapentin 300mg  bid tid, Tylenol 500mg  bid, Norco 5/325mg  tid.  CHF/edema, compensated, on Furosemide 20mg  qd. Bun/creat 25/1.23 01/31/20  Diet controlled T2DM, Hgb a1c 5.7, TSH 2.17 01/31/20   Past Medical History:  Diagnosis Date  . Acute blood loss anemia   . Allergy   . Asthma   .  AVM (arteriovenous malformation) of colon with hemorrhage   . Bursitis of right hip   . Cataract   . CKD (chronic kidney disease), stage III (HCC) 11/04/2015  . COLONIC POLYPS 03/26/2005   Qualifier: Diagnosis of  By: Glyn Ade CMA (AAMA), June    . DIVERTICULOSIS, COLON 03/26/2005   Qualifier: Diagnosis of  By: Glyn Ade CMA (AAMA), June    . DVT (deep venous thrombosis), left 11/03/2015  . Ecchymosis 12/16/2015  . Hyperglycemia 12/16/2015  . Hyperlipidemia   . Hypertension   . Multiple skin tears 12/16/2015  . Osteopenia   . Stasis edema of both lower extremities 11/04/2015  . Unstable gait 12/16/2015  . Urine incontinence 12/16/2015   Past Surgical History:  Procedure Laterality Date  . ABDOMINAL HYSTERECTOMY  2006   Dr. Delia Heady  . CATARACT EXTRACTION W/ INTRAOCULAR LENS IMPLANT Bilateral 1999/2003   Dr. Jethro Bolus  . COLONOSCOPY N/A 11/06/2015   Procedure: COLONOSCOPY;  Surgeon: Napoleon Form, MD;  Location: [facility];  Service: Endoscopy;  Laterality: N/A;  MAC if available, otherwise moderate sedation    Allergies  Allergen Reactions  . Epinephrine Nausea Only    Unknown.  Marland Kitchen Hydrocodone-Acetaminophen     REACTION: nausea  . Labetalol   . Procaine     Allergies as of 04/18/2020      Reactions   Epinephrine Nausea Only   Unknown.   Hydrocodone-acetaminophen    REACTION: nausea   Labetalol    Procaine       Medication List  Accurate as of April 18, 2020 11:59 PM. If you have any questions, ask your nurse or doctor.        acetaminophen 500 MG tablet Commonly known as: TYLENOL Take 500 mg by mouth 2 (two) times daily.   calcium carbonate 500 MG chewable tablet Commonly known as: TUMS - dosed in mg elemental calcium Chew 2 tablets by mouth every morning.   cholecalciferol 1000 units tablet Commonly known as: VITAMIN D Take 1,000 Units by mouth daily.   furosemide 20 MG tablet Commonly known as: LASIX Take 1 tablet (20 mg total) by mouth  daily.   gabapentin 300 MG capsule Commonly known as: NEURONTIN Take 300 mg by mouth 2 (two) times daily.   HYDROcodone-acetaminophen 5-325 MG tablet Commonly known as: NORCO/VICODIN Take one tablet by mouth three times daily for pain   loperamide 2 MG tablet Commonly known as: IMODIUM A-D Take 4 mg by mouth as needed for diarrhea or loose stools. If 3 loose stools in 24 hours, hold all laxatives and stool softeners. Give Imodium AD (loperamide) 4 mg po initial dose, then 2 mg po after each loose stool X 48 hrs.(maximum of 16mg  in 24 hrs. If diarrhea continues, notify MD   multivitamin with minerals tablet Take 1 tablet by mouth daily.   ICAPS MV PO Take 1 tablet by mouth daily.   nystatin powder Generic drug: nystatin Apply to groin 2 times daily as needed       Review of Systems  Constitutional: Negative for activity change, fever and unexpected weight change.  HENT: Positive for hearing loss. Negative for congestion and voice change.   Respiratory: Negative for cough, shortness of breath and wheezing.   Cardiovascular: Positive for leg swelling. Negative for chest pain and palpitations.  Gastrointestinal: Negative for abdominal pain and constipation.  Genitourinary: Negative for difficulty urinating, dysuria, frequency and urgency.  Musculoskeletal: Positive for arthralgias, back pain and gait problem.       Lower back, right hip  Skin: Negative for color change.  Neurological: Negative for speech difficulty, weakness and light-headedness.       Memory lapses.   Psychiatric/Behavioral: Negative for behavioral problems and sleep disturbance. The patient is not nervous/anxious.     Immunization History  Administered Date(s) Administered  . DTaP 05/13/2005  . Influenza, High Dose Seasonal PF 02/23/2019  . Influenza-Unspecified 02/11/2015, 02/22/2016, 02/19/2017, 02/16/2018  . Moderna SARS-COVID-2 Vaccination 05/17/2019, 06/14/2019  . PPD Test 12/08/2015, 12/22/2015  .  Pneumococcal Conjugate-13 05/13/2014  . Pneumococcal Polysaccharide-23 05/13/2014, 03/17/2019  . Td 12/11/2005  . Tdap 01/04/2017  . Zoster 05/13/2014   Pertinent  Health Maintenance Due  Topic Date Due  . FOOT EXAM  Never done  . HEMOGLOBIN A1C  09/10/2017  . URINE MICROALBUMIN  07/07/2018  . INFLUENZA VACCINE  12/12/2019  . OPHTHALMOLOGY EXAM  02/28/2021  . DEXA SCAN  Completed  . PNA vac Low Risk Adult  Completed   Fall Risk  01/05/2018 12/30/2016 11/03/2015  Falls in the past year? No No No   Functional Status Survey:    Vitals:   04/18/20 1605  BP: 117/64  Pulse: 65  Resp: 20  Temp: 97.6 F (36.4 C)  SpO2: 96%  Weight: 161 lb 3.2 oz (73.1 kg)  Height: 5\' 3"  (1.6 m)   Body mass index is 28.56 kg/m. Physical Exam Vitals and nursing note reviewed.  Constitutional:      General: She is not in acute distress.    Appearance: Normal appearance. She  is not ill-appearing, toxic-appearing or diaphoretic.     Comments: Over weight  HENT:     Mouth/Throat:     Mouth: Mucous membranes are moist.  Eyes:     Extraocular Movements: Extraocular movements intact.     Conjunctiva/sclera: Conjunctivae normal.     Pupils: Pupils are equal, round, and reactive to light.  Cardiovascular:     Rate and Rhythm: Normal rate and regular rhythm.     Heart sounds: No murmur heard.   Pulmonary:     Breath sounds: No rales.     Comments: Bibasilar rales.  Abdominal:     Palpations: Abdomen is soft.     Tenderness: There is no abdominal tenderness.  Musculoskeletal:     Cervical back: Normal range of motion and neck supple.     Right lower leg: Edema present.     Left lower leg: Edema present.     Comments: Trace edema BLE, ambulates with walker.   Skin:    General: Skin is warm and dry.  Neurological:     General: No focal deficit present.     Mental Status: She is alert and oriented to person, place, and time. Mental status is at baseline.     Motor: No weakness.     Gait:  Gait abnormal.  Psychiatric:        Mood and Affect: Mood normal.        Behavior: Behavior normal.        Thought Content: Thought content normal.        Judgment: Judgment normal.     Labs reviewed: Recent Labs    07/22/19 0000  NA 141  K 4.2  CL 106  CO2 27*  BUN 31*  CREATININE 1.3*  CALCIUM 9.2   Recent Labs    07/22/19 0000  AST 21  ALT 10  ALKPHOS 45  ALBUMIN 3.9   Recent Labs    07/22/19 0000  WBC 5.2  NEUTROABS 2,361  HGB 12.2  HCT 39  PLT 183   Lab Results  Component Value Date   TSH 1.73 12/21/2018   Lab Results  Component Value Date   HGBA1C 5.7 03/13/2017   Lab Results  Component Value Date   CHOL 211 (A) 06/16/2017   HDL 74 (A) 06/16/2017   LDLCALC 117 06/16/2017   TRIG 96 06/16/2017    Significant Diagnostic Results in last 30 days:  No results found.  Assessment/Plan Chronic diastolic congestive heart failure (HCC) CHF/edema, compensated, on Furosemide 20mg  qd. Bun/creat 25/1.23 01/31/20   Type 2 diabetes mellitus with diabetic chronic kidney disease (HCC) Diet controlled T2DM, Hgb a1c 5.7, TSH 2.17 01/31/20  Osteoarthritis involving multiple joints on both sides of body OA, pain in the right hip, worsened in morning, on Gabapentin 300mg  bid tid, Tylenol 500mg  bid, Norco 5/325mg  tid.    Family/ staff Communication: plan of care reviewed with the patient and charge nurse.   Labs/tests ordered:  none  Time spend 40 minutes.

## 2020-04-19 ENCOUNTER — Encounter: Payer: Self-pay | Admitting: Nurse Practitioner

## 2020-04-19 NOTE — Assessment & Plan Note (Signed)
OA, pain in the right hip, worsened in morning, on Gabapentin 300mg bid tid, Tylenol 500mg bid, Norco 5/325mg tid.  

## 2020-04-19 NOTE — Assessment & Plan Note (Signed)
Diet controlled T2DM, Hgb a1c 5.7, TSH 2.17 01/31/20

## 2020-04-19 NOTE — Assessment & Plan Note (Signed)
CHF/edema, compensated, on Furosemide 20mg  qd. Bun/creat 25/1.23 01/31/20

## 2020-04-21 ENCOUNTER — Encounter: Payer: Self-pay | Admitting: Nurse Practitioner

## 2020-05-03 ENCOUNTER — Other Ambulatory Visit: Payer: Self-pay | Admitting: *Deleted

## 2020-05-03 DIAGNOSIS — G8929 Other chronic pain: Secondary | ICD-10-CM

## 2020-05-03 DIAGNOSIS — M25551 Pain in right hip: Secondary | ICD-10-CM

## 2020-05-03 NOTE — Telephone Encounter (Signed)
Received fax from [facility] and sent to [facility] for approval.

## 2020-05-04 MED ORDER — HYDROCODONE-ACETAMINOPHEN 5-325 MG PO TABS: ORAL_TABLET | ORAL | 0 refills | Status: DC

## 2020-06-05 ENCOUNTER — Other Ambulatory Visit: Payer: Self-pay

## 2020-06-05 DIAGNOSIS — M25551 Pain in right hip: Secondary | ICD-10-CM

## 2020-06-05 DIAGNOSIS — G8929 Other chronic pain: Secondary | ICD-10-CM

## 2020-06-05 DIAGNOSIS — M5416 Radiculopathy, lumbar region: Secondary | ICD-10-CM

## 2020-06-05 NOTE — Telephone Encounter (Signed)
Refill request received from [facility] for Norco (hydrocodone 5/325mg  tablet three times daily. Medication pended and sent to Dr. Lyndel Safe for approval.

## 2020-06-06 MED ORDER — HYDROCODONE-ACETAMINOPHEN 5-325 MG PO TABS: ORAL_TABLET | ORAL | 0 refills | Status: DC

## 2020-06-19 ENCOUNTER — Encounter: Payer: Self-pay | Admitting: Nurse Practitioner

## 2020-06-19 ENCOUNTER — Non-Acute Institutional Stay: Payer: Medicare Other | Admitting: Nurse Practitioner

## 2020-06-19 DIAGNOSIS — N1831 Chronic kidney disease, stage 3a: Secondary | ICD-10-CM

## 2020-06-19 DIAGNOSIS — M159 Polyosteoarthritis, unspecified: Secondary | ICD-10-CM

## 2020-06-19 DIAGNOSIS — I5032 Chronic diastolic (congestive) heart failure: Secondary | ICD-10-CM | POA: Diagnosis not present

## 2020-06-19 DIAGNOSIS — L89601 Pressure ulcer of unspecified heel, stage 1: Secondary | ICD-10-CM | POA: Diagnosis not present

## 2020-06-19 DIAGNOSIS — R2681 Unsteadiness on feet: Secondary | ICD-10-CM

## 2020-06-19 DIAGNOSIS — E1122 Type 2 diabetes mellitus with diabetic chronic kidney disease: Secondary | ICD-10-CM

## 2020-06-19 NOTE — Progress Notes (Signed)
Location:    [place] Room Number: 23 Place of Service:  AL[facility] [phone]) Provider:  Marda Stalker, Lennie Odor NP  [name], MD  Patient Care Team: [name], MD as PCP - General (Internal Medicine) Rutherford Guys, MD as Consulting Physician (Ophthalmology) [name], MD as Consulting Physician (Internal Medicine)  Extended Emergency Contact Information Primary Emergency Contact: Bruington,Leonard Address: [address] [place] [address] Johnnette Litter of [place] Phone: [phone] Work Phone: [phone] Mobile Phone: [phone] Relation: Son Secondary Emergency Contact: Butler of [place] Phone: [phone] Relation: Friend  Code Status:  DNR Managed Care Goals of care: Advanced Directive information Advanced Directives 10/15/2019  Does Patient Have a Medical Advance Directive? Yes  Type of Paramedic of [place];Living will;Out of facility DNR (pink MOST or yellow form)  Does patient want to make changes to medical advance directive? No - Patient declined  Copy of [place] in Chart? Yes - validated most recent copy scanned in chart (See row information)  Would patient like information on creating a medical advance directive? -  Pre-existing out of facility DNR order (yellow form or pink MOST form) -     Chief Complaint  Patient presents with  . Acute Visit    Left heel pain    HPI:  Pt is a 87 y.o. female seen today for an acute visit for reddened L heel, un blanchable, no open area noted. Pain is better after skin prep applied.     The patient resides in [place] [facility], ambulates with walker.              OA, pain in the right hip, worsened in morning, on Gabapentin 352m bid tid, Tylenol 5069mbid, Norco 5/32558mid.         CHF/edema, compensated, on Furosemide 26m44m. Bun/creat 25/1.23 01/31/20             Diet controlled T2DM, Hgb a1c 5.7, TSH 2.17  01/31/20  Unstable gait, ambulates with walker, slow.  Past Medical History:  Diagnosis Date  . Acute blood loss anemia   . Allergy   . Asthma   . AVM (arteriovenous malformation) of colon with hemorrhage   . Bursitis of right hip   . Cataract   . CKD (chronic kidney disease), stage III (HCC)[place]24/2017  . COLONIC POLYPS 03/26/2005   Qualifier: Diagnosis of  By: McMuTalbert Cage (AAM[place]une    . DIVERTICULOSIS, COLON 03/26/2005   Qualifier: Diagnosis of  By: McMuTalbert Cage (AAM[place]    . DVT (deep venous thrombosis), left 11/03/2015  . Ecchymosis 12/16/2015  . Hyperglycemia 12/16/2015  . Hyperlipidemia   . Hypertension   . Multiple skin tears 12/16/2015  . Osteopenia   . Stasis edema of both lower extremities 11/04/2015  . Unstable gait 12/16/2015  . Urine incontinence 12/16/2015   Past Surgical History:  Procedure Laterality Date  . ABDOMINAL HYSTERECTOMY  2006   Dr. Wes Maryelizabeth RowanCATARACT EXTRACTION W/ INTRAOCULAR LENS IMPLANT Bilateral 1999/2003   Dr. MarkRutherford GuysCOLONOSCOPY N/A 11/06/2015   Procedure: COLONOSCOPY;  Surgeon: KaviMauri Pole;  Location: [facility];  Service: Endoscopy;  Laterality: N/A;  MAC if available, otherwise moderate sedation    Allergies  Allergen Reactions  . Epinephrine Nausea Only    Unknown.  . HyMarland Kitchenrocodone-Acetaminophen     REACTION: nausea  .  Labetalol   . Procaine     Allergies as of 06/19/2020      Reactions   Epinephrine Nausea Only   Unknown.   Hydrocodone-acetaminophen    REACTION: nausea   Labetalol    Procaine       Medication List       Accurate as of June 19, 2020 11:59 PM. If you have any questions, ask your nurse or doctor.        acetaminophen 500 MG tablet Commonly known as: TYLENOL Take 500 mg by mouth 2 (two) times daily.   calcium carbonate 500 MG chewable tablet Commonly known as: TUMS - dosed in mg elemental calcium Chew 2 tablets by mouth every morning.   cholecalciferol 1000 units tablet Commonly  known as: VITAMIN D Take 1,000 Units by mouth daily.   furosemide 20 MG tablet Commonly known as: LASIX Take 1 tablet (20 mg total) by mouth daily.   gabapentin 300 MG capsule Commonly known as: NEURONTIN Take 300 mg by mouth 2 (two) times daily.   HYDROcodone-acetaminophen 5-325 MG tablet Commonly known as: NORCO/VICODIN Take one tablet by mouth three times daily for pain   loperamide 2 MG tablet Commonly known as: IMODIUM A-D Take 4 mg by mouth as needed for diarrhea or loose stools. If 3 loose stools in 24 hours, hold all laxatives and stool softeners. Give Imodium AD (loperamide) 4 mg po initial dose, then 2 mg po after each loose stool X 48 hrs.(maximum of 69m in 24 hrs. If diarrhea continues, notify MD   multivitamin with minerals tablet Take 1 tablet by mouth daily.   ICAPS MV PO Take 1 tablet by mouth daily.   nystatin powder Commonly known as: MYCOSTATIN/NYSTOP Apply to groin 2 times daily as needed       Review of Systems  Constitutional: Negative for fatigue, fever and unexpected weight change.  HENT: Positive for hearing loss. Negative for congestion and voice change.   Respiratory: Negative for cough and shortness of breath.   Cardiovascular: Positive for leg swelling.  Gastrointestinal: Negative for abdominal pain and constipation.  Genitourinary: Negative for dysuria, frequency and urgency.  Musculoskeletal: Positive for arthralgias, back pain and gait problem.       Lower back, right hip  Skin:       Reddened L heel.   Neurological: Negative for speech difficulty, weakness and headaches.       Memory lapses.   Psychiatric/Behavioral: Negative for behavioral problems and sleep disturbance. The patient is not nervous/anxious.     Immunization History  Administered Date(s) Administered  . DTaP 05/13/2005  . Influenza, High Dose Seasonal PF 02/23/2019  . Influenza-Unspecified 02/11/2015, 02/22/2016, 02/19/2017, 02/16/2018  . Moderna Sars-Covid-2  Vaccination 05/17/2019, 06/14/2019  . PPD Test 12/08/2015, 12/22/2015  . Pneumococcal Conjugate-13 05/13/2014  . Pneumococcal Polysaccharide-23 05/13/2014, 03/17/2019  . Td 12/11/2005  . Tdap 01/04/2017  . Zoster 05/13/2014   Pertinent  Health Maintenance Due  Topic Date Due  . FOOT EXAM  Never done  . URINE MICROALBUMIN  07/07/2018  . INFLUENZA VACCINE  12/12/2019  . HEMOGLOBIN A1C  07/30/2020  . OPHTHALMOLOGY EXAM  02/28/2021  . DEXA SCAN  Completed  . PNA vac Low Risk Adult  Completed   Fall Risk  01/05/2018 12/30/2016 11/03/2015  Falls in the past year? No No No   Functional Status Survey:    Vitals:   06/19/20 1011  BP: 115/62  Pulse: 62  Resp: 20  Temp: (!) 97.3 F (36.3  C)  SpO2: 95%  Weight: 162 lb 9.6 oz (73.8 kg)  Height: _0  (1.6 m)   Body mass index is 28.8 kg/m. Physical Exam Vitals and nursing note reviewed.  Constitutional:      Appearance: Normal appearance.     Comments: Over weight  HENT:     Head: Normocephalic and atraumatic.     Mouth/Throat:     Mouth: Mucous membranes are moist.  Eyes:     Extraocular Movements: Extraocular movements intact.     Conjunctiva/sclera: Conjunctivae normal.     Pupils: Pupils are equal, round, and reactive to light.  Cardiovascular:     Rate and Rhythm: Normal rate and regular rhythm.     Heart sounds: No murmur heard.   Pulmonary:     Breath sounds: No rales.     Comments: Bibasilar rales.  Abdominal:     Palpations: Abdomen is soft.     Tenderness: There is no abdominal tenderness.  Musculoskeletal:     Cervical back: Normal range of motion and neck supple.     Right lower leg: Edema present.     Left lower leg: Edema present.     Comments: Trace edema BLE, ambulates with walker.   Skin:    General: Skin is warm and dry.     Comments: Reddened L heel, non blanchable, pain is resolved after skin prep applied.   Neurological:     General: No focal deficit present.     Mental Status: She is alert  and oriented to person, place, and time. Mental status is at baseline.     Motor: No weakness.     Gait: Gait abnormal.  Psychiatric:        Mood and Affect: Mood normal.        Behavior: Behavior normal.        Thought Content: Thought content normal.        Judgment: Judgment normal.     Labs reviewed: Recent Labs    07/22/19 0000 01/31/20 0000  NA 141 144  K 4.2 4.4  CL 106 107  CO2 27* 29*  BUN 31* 25*  CREATININE 1.3* 1.2*  CALCIUM 9.2 9.5   Recent Labs    07/22/19 0000 01/31/20 0000  AST 21 20  ALT 10 8  ALKPHOS 45 46  ALBUMIN 3.9 3.9   Recent Labs    07/22/19 0000 01/31/20 0000  WBC 5.2 5.5  NEUTROABS 2,361 2,536.00  HGB 12.2 13.3  HCT 39 41  PLT 183 196   Lab Results  Component Value Date   TSH 2.17 01/31/2020   Lab Results  Component Value Date   HGBA1C 5.7 01/31/2020   Lab Results  Component Value Date   CHOL 211 (A) 06/16/2017   HDL 74 (A) 06/16/2017   LDLCALC 117 06/16/2017   TRIG 96 06/16/2017    Significant Diagnostic Results in last 30 days:  No results found.  Assessment/Plan Pressure ulcer, stage 1  reddened L heel, un blanchable, no open area noted. Pressure reduction with float heels while in bed, apply skin prep to protect skin. Observe.    Chronic diastolic congestive heart failure (HCC) compensated, on Furosemide 41m qd. Bun/creat 25/1.23 01/31/20   Type 2 diabetes mellitus with diabetic chronic kidney disease (HCC)  Hgb a1c 5.7, TSH 2.17 01/31/20   Osteoarthritis involving multiple joints on both sides of body pain in the right hip, worsened in morning, on Gabapentin 3015mbid tid, Tylenol 50059mid, Norco 5/325m71m  tid.    Unstable gait ambulates with walker, slow.       Family/ staff Communication: plan of care reviewed with the patient and charge nurse.   Labs/tests ordered: none  Time spend 40 minutes.

## 2020-06-20 ENCOUNTER — Encounter: Payer: Self-pay | Admitting: Nurse Practitioner

## 2020-06-20 DIAGNOSIS — L8991 Pressure ulcer of unspecified site, stage 1: Secondary | ICD-10-CM | POA: Insufficient documentation

## 2020-06-20 NOTE — Assessment & Plan Note (Signed)
compensated, on Furosemide 20mg  qd. Bun/creat 25/1.23 01/31/20

## 2020-06-20 NOTE — Assessment & Plan Note (Signed)
Hgb a1c 5.7, TSH 2.17 01/31/20

## 2020-06-20 NOTE — Assessment & Plan Note (Signed)
pain in the right hip, worsened in morning, on Gabapentin 300mg  bid tid, Tylenol 500mg  bid, Norco 5/325mg  tid.

## 2020-06-20 NOTE — Assessment & Plan Note (Signed)
ambulates with walker, slow.

## 2020-06-20 NOTE — Assessment & Plan Note (Addendum)
reddened L heel, un blanchable, no open area noted. Pressure reduction with float heels while in bed, apply skin prep to protect skin. Observe.

## 2020-06-21 ENCOUNTER — Encounter: Payer: Self-pay | Admitting: Nurse Practitioner

## 2020-06-23 DIAGNOSIS — M79672 Pain in left foot: Secondary | ICD-10-CM | POA: Diagnosis not present

## 2020-06-23 DIAGNOSIS — M79671 Pain in right foot: Secondary | ICD-10-CM | POA: Diagnosis not present

## 2020-06-23 DIAGNOSIS — L602 Onychogryphosis: Secondary | ICD-10-CM | POA: Diagnosis not present

## 2020-06-30 ENCOUNTER — Non-Acute Institutional Stay (INDEPENDENT_AMBULATORY_CARE_PROVIDER_SITE_OTHER): Payer: Medicare Other | Admitting: Nurse Practitioner

## 2020-06-30 ENCOUNTER — Encounter: Payer: Self-pay | Admitting: Nurse Practitioner

## 2020-06-30 DIAGNOSIS — Z0000 Encounter for general adult medical examination without abnormal findings: Secondary | ICD-10-CM | POA: Diagnosis not present

## 2020-07-03 ENCOUNTER — Encounter: Payer: Self-pay | Admitting: Nurse Practitioner

## 2020-07-03 NOTE — Progress Notes (Signed)
Subjective:   Yolanda [name] is a 87 y.o. female who presents for Medicare Annual (Subsequent) preventive examination in Assisted Living @ [facility]     Objective:    Today's Vitals   06/30/20 1610  BP: 117/60  Pulse: 63  Resp: 16  Temp: (!) 97.1 F (36.2 C)  SpO2: 95%  Weight: 162 lb 9.6 oz (73.8 kg)  Height: _0  (1.6 m)   Body mass index is 28.8 kg/m.  Advanced Directives 10/15/2019 07/19/2019 03/16/2019 12/17/2018 09/29/2018 07/31/2018 04/06/2018  Does Patient Have a Medical Advance Directive? _1  Yes Yes  Type of Paramedic of [place];Living will;Out of facility DNR (pink MOST or yellow form) Out of facility DNR (pink MOST or yellow form);Living will;Healthcare Power of Attorney Out of facility DNR (pink MOST or yellow form) [place];Living will;Out of facility DNR (pink MOST or yellow form) [place];Out of facility DNR (pink MOST or yellow form);Living will [place];Out of facility DNR (pink MOST or yellow form);Living will [place];Living will;Out of facility DNR (pink MOST or yellow form)  Does patient want to make changes to medical advance directive? No - Patient declined No - Patient declined No - Patient declined No - Patient declined No - Patient declined No - Patient declined No - Patient declined  Copy of [place] in Chart? Yes - validated most recent copy scanned in chart (See row information) Yes - validated most recent copy scanned in chart (See row information) - Yes - validated most recent copy scanned in chart (See row information) Yes - validated most recent copy scanned in chart (See row information) Yes - validated most recent copy scanned in chart (See row information) Yes - validated most recent copy scanned in chart (See row information)  Would patient like information on creating a medical advance directive? - - - -  - - -  Pre-existing out of facility DNR order (yellow form or pink MOST form) - Yellow form placed in chart (order not valid for inpatient use);Pink MOST form placed in chart (order not valid for inpatient use) Pink MOST form placed in chart (order not valid for inpatient use) Yellow form placed in chart (order not valid for inpatient use);Pink MOST form placed in chart (order not valid for inpatient use) Yellow form placed in chart (order not valid for inpatient use);Pink MOST form placed in chart (order not valid for inpatient use) Yellow form placed in chart (order not valid for inpatient use);Pink MOST form placed in chart (order not valid for inpatient use) Pink MOST form placed in chart (order not valid for inpatient use)    Current Medications (verified) Outpatient Encounter Medications as of 06/30/2020  Medication Sig  . acetaminophen (TYLENOL) 500 MG tablet Take 500 mg by mouth 2 (two) times daily.   . calcium carbonate (TUMS - DOSED IN MG ELEMENTAL CALCIUM) 500 MG chewable tablet Chew 2 tablets by mouth every morning.   . cholecalciferol (VITAMIN D) 1000 units tablet Take 1,000 Units by mouth daily.  . furosemide (LASIX) 20 MG tablet Take 1 tablet (20 mg total) by mouth daily.  Marland Kitchen gabapentin (NEURONTIN) 300 MG capsule Take 300 mg by mouth 2 (two) times daily.  Marland Kitchen HYDROcodone-acetaminophen (NORCO/VICODIN) 5-325 MG tablet Take one tablet by mouth three times daily for pain  . loperamide (IMODIUM A-D) 2 MG tablet Take 4 mg by mouth as needed for diarrhea  or loose stools. If 3 loose stools in 24 hours, hold all laxatives and stool softeners. Give Imodium AD (loperamide) 4 mg po initial dose, then 2 mg po after each loose stool X 48 hrs.(maximum of 38m in 24 hrs. If diarrhea continues, notify MD  . Multiple Vitamins-Minerals (ICAPS MV PO) Take 1 tablet by mouth daily.  . Multiple Vitamins-Minerals (MULTIVITAMIN WITH MINERALS) tablet Take 1 tablet by mouth daily.   .Marland Kitchennystatin (MYCOSTATIN/NYSTOP)  powder Apply to groin 2 times daily as needed   No facility-administered encounter medications on file as of 06/30/2020.    Allergies (verified) Epinephrine, Hydrocodone-acetaminophen, Labetalol, and Procaine   History: Past Medical History:  Diagnosis Date  . Acute blood loss anemia   . Allergy   . Asthma   . AVM (arteriovenous malformation) of colon with hemorrhage   . Bursitis of right hip   . Cataract   . CKD (chronic kidney disease), stage III ([place] 11/04/2015  . COLONIC POLYPS 03/26/2005   Qualifier: Diagnosis of  By: MTalbert CageCMA ([place], June    . DIVERTICULOSIS, COLON 03/26/2005   Qualifier: Diagnosis of  By: MTalbert CageCMA ([place], June    . DVT (deep venous thrombosis), left 11/03/2015  . Ecchymosis 12/16/2015  . Hyperglycemia 12/16/2015  . Hyperlipidemia   . Hypertension   . Multiple skin tears 12/16/2015  . Osteopenia   . Stasis edema of both lower extremities 11/04/2015  . Unstable gait 12/16/2015  . Urine incontinence 12/16/2015   Past Surgical History:  Procedure Laterality Date  . ABDOMINAL HYSTERECTOMY  2006   Dr. WMaryelizabeth Rowan . CATARACT EXTRACTION W/ INTRAOCULAR LENS IMPLANT Bilateral 1999/2003   Dr. MRutherford Guys . COLONOSCOPY N/A 11/06/2015   Procedure: COLONOSCOPY;  Surgeon: KMauri Pole MD;  Location[facility];  Service: Endoscopy;  Laterality: N/A;  MAC if available, otherwise moderate sedation   Family History  Problem Relation Age of Onset  . Heart attack Father 971  Social History   Socioeconomic History  . Marital status: Widowed    Spouse name: Not on file  . Number of children: 2  . Years of education: Not on file  . Highest education level: Not on file  Occupational History  . 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 Smoking status: Never Smoker  . Smokeless tobacco: Never Used  Vaping Use  . Vaping Use: Never used  Substance and Sexual Activity  . Alcohol use: No    Alcohol/week: 0.0 standard drinks  . Drug use: No  .  Sexual activity: Never    Comment: was until a few years ago  Other Topics Concern  . Not on file  Social History Narrative   Lives alone in an retirement community. [place]   Walks with a walker.   NOK: 2 sons with shared POA (they live far away) - DShanon BrowDintinfass would be first call.   Never smoked   Alcohol none   Exercise none   POA, Living Will, MOST             Social Determinants of Health   Financial Resource Strain: Not on file  Food Insecurity: Not on file  Transportation Needs: Not on file  Physical Activity: Not on file  Stress: Not on file  Social Connections: Not on file    Tobacco Counseling Counseling given: Not Answered   Clinical Intake:  Pre-visit preparation completed: Yes  Pain : No/denies pain     BMI -  recorded: 28.8 Nutritional Status: BMI 25 -29 Overweight Nutritional Risks: None Diabetes: Yes CBG done?: No Did pt. bring in CBG monitor from home?: No  How often do you need to have someone help you when you read instructions, pamphlets, or other written materials from your doctor or pharmacy?: 1 - Never What is the last grade level you completed in school?: bachelor's degree  Diabetic?yes  Interpreter Needed?: No      Activities of Daily Living No flowsheet data found.  Patient Care Team: [name], MD as PCP - General (Internal Medicine) Rutherford Guys, MD as Consulting Physician (Ophthalmology) [name], MD as Consulting Physician (Internal Medicine)  Indicate any recent Medical Services you may have received from other than [facility] providers in the past year (date may be approximate).     Assessment:   This is a routine wellness examination for [facility].  Hearing/Vision screen No exam data present  Dietary issues and exercise activities discussed:    Goals    . Maintain Mobility and Function     Evidence-based guidance:   Emphasize the importance of physical activity and aerobic exercise as  included in treatment plan; assess barriers to adherence; consider patient's abilities and preferences.   Encourage gradual increase in activity or exercise instead of stopping if pain occurs.   Reinforce individual therapy exercise prescription, such as strengthening, stabilization and stretching programs.   Promote optimal body mechanics to stabilize the spine with lifting and functional activity.   Encourage activity and mobility modifications to facilitate optimal function, such as using a log roll for bed mobility or dressing from a seated position.   Reinforce individual adaptive equipment recommendations to limit excessive spinal movements, such as a Systems analyst.   Assess adequacy of sleep; encourage use of sleep hygiene techniques, such as bedtime routine; use of white noise; dark, cool bedroom; avoiding daytime naps, heavy meals or exercise before bedtime.   Promote positions and modification to optimize sleep and sexual activity; consider pillows or positioning devices to assist in maintaining neutral spine.   Explore options for applying ergonomic principles at work and home, such as frequent position changes, using ergonomically designed equipment and working at optimal height.   Promote modifications to increase comfort with driving such as lumbar support, optimizing seat and steering wheel position, using cruise control and taking frequent rest stops to stretch and walk.   Notes:       Depression Screen PHQ 2/9 Scores 01/05/2018 12/30/2016 11/03/2015  PHQ - 2 Score 0 0 0    Fall Risk Fall Risk  01/05/2018 12/30/2016 11/03/2015  Falls in the past year? No No No    FALL RISK PREVENTION PERTAINING TO THE HOME:  Any stairs in or around the home? Yes  If so, are there any without handrails? No  Home free of loose throw rugs in walkways, pet beds, electrical cords, etc? No  Adequate lighting in your home to reduce risk of falls? Yes   ASSISTIVE DEVICES UTILIZED TO  PREVENT FALLS:  Life alert? No  Use of a cane, walker or w/c? Yes  Grab bars in the bathroom? Yes  Shower chair or bench in shower? Yes  Elevated toilet seat or a handicapped toilet? Yes   TIMED UP AND GO:  Was the test performed? Yes .  Length of time to ambulate 10 feet: 15 sec.   Gait slow and steady with assistive device  Cognitive Function: MMSE - Mini Mental State Exam 01/05/2018 12/30/2016  Orientation  to time 5 5  Orientation to Place 5 5  Registration 3 3  Attention/ Calculation 5 5  Recall 3 3  Language- name 2 objects 2 2  Language- repeat 1 1  Language- follow 3 step command 3 3  Language- read & follow direction 1 1  Write a sentence 1 1  Copy design 1 1  Total score 30 30        Immunizations Immunization History  Administered Date(s) Administered  . DTaP 05/13/2005  . Influenza, High Dose Seasonal PF 02/23/2019, 02/15/2020  . Influenza-Unspecified 02/11/2015, 02/22/2016, 02/19/2017, 02/16/2018  . Moderna Sars-Covid-2 Vaccination 05/17/2019, 06/14/2019, 03/27/2020  . PPD Test 12/08/2015, 12/22/2015  . Pneumococcal Conjugate-13 05/13/2014  . Pneumococcal Polysaccharide-23 05/13/2014, 03/17/2019  . Td 12/11/2005  . Tdap 01/04/2017  . Zoster 05/13/2014    TDAP status: Up to date  Flu Vaccine status: Up to date  Pneumococcal vaccine status: Up to date  Covid-19 vaccine status: Completed vaccines  Qualifies for Shingles Vaccine? Yes   Zostavax completed Yes   Shingrix Completed?: Yes  Screening Tests Health Maintenance  Topic Date Due  . FOOT EXAM  Never done  . URINE MICROALBUMIN  07/07/2018  . HEMOGLOBIN A1C  07/30/2020  . OPHTHALMOLOGY EXAM  02/28/2021  . TETANUS/TDAP  01/05/2027  . INFLUENZA VACCINE  Completed  . DEXA SCAN  Completed  . COVID-19 Vaccine  Completed  . PNA vac Low Risk Adult  Completed    Health Maintenance  Health Maintenance Due  Topic Date Due  . FOOT EXAM  Never done  . URINE MICROALBUMIN  07/07/2018     Colorectal cancer screening: No longer required.   Mammogram status: No longer required due to aged out.  Bone Density status: Completed 2019. Results reflect: Bone density results: OSTEOPENIA. Repeat every prn years.  Lung Cancer Screening: (Low Dose CT Chest recommended if Age 70-80 years, 30 pack-year currently smoking OR have quit w/in 15years.) does not qualify.   Lung Cancer Screening Referral: no  Additional Screening:  Hepatitis C Screening: does not qualify  Vision Screening: Recommended annual ophthalmology exams for early detection of glaucoma and other disorders of the eye. Is the patient up to d ate with their annual eye exam?  Yes  Who is the provider or what is the name of the office in which the patient attends annual eye exams? Dr. Gershon Crane If pt is not established with a provider, would they like to be referred to a provider to establish care? No .   Dental Screening: Recommended annual dental exams for proper oral hygiene  Community Resource Referral / Chronic Care Management: CRR required this visit?  No   CCM required this visit?  No      Plan:     I have personally reviewed and noted the following in the patient's chart:   . Medical and social history . Use of alcohol, tobacco or illicit drugs  . Current medications and supplements . Functional ability and status . Nutritional status . Physical activity . Advanced directives . List of other physicians . Hospitalizations, surgeries, and ER visits in previous 12 months . Vitals . Screenings to include cognitive, depression, and falls . Referrals and appointments  In addition, I have reviewed and discussed with patient certain preventive protocols, quality metrics, and best practice recommendations. A written personalized care plan for preventive services as well as general preventive health recommendations were provided to patient.     Christie Viscomi X Eilyn Polack, NP   07/03/2020

## 2020-07-04 ENCOUNTER — Other Ambulatory Visit: Payer: Self-pay | Admitting: *Deleted

## 2020-07-04 DIAGNOSIS — G8929 Other chronic pain: Secondary | ICD-10-CM

## 2020-07-04 DIAGNOSIS — M5416 Radiculopathy, lumbar region: Secondary | ICD-10-CM

## 2020-07-04 DIAGNOSIS — M25551 Pain in right hip: Secondary | ICD-10-CM

## 2020-07-04 MED ORDER — HYDROCODONE-ACETAMINOPHEN 5-325 MG PO TABS: ORAL_TABLET | ORAL | 0 refills | Status: DC

## 2020-07-04 NOTE — Telephone Encounter (Signed)
Received refill Request from [facility] °Pended Rx and sent to Dr. Gupta for approval.  °

## 2020-07-31 ENCOUNTER — Encounter: Payer: Self-pay | Admitting: Nurse Practitioner

## 2020-07-31 ENCOUNTER — Non-Acute Institutional Stay: Payer: Medicare Other | Admitting: Nurse Practitioner

## 2020-07-31 DIAGNOSIS — E1122 Type 2 diabetes mellitus with diabetic chronic kidney disease: Secondary | ICD-10-CM

## 2020-07-31 DIAGNOSIS — H1132 Conjunctival hemorrhage, left eye: Secondary | ICD-10-CM

## 2020-07-31 DIAGNOSIS — R609 Edema, unspecified: Secondary | ICD-10-CM | POA: Diagnosis not present

## 2020-07-31 DIAGNOSIS — R2681 Unsteadiness on feet: Secondary | ICD-10-CM

## 2020-07-31 DIAGNOSIS — M159 Polyosteoarthritis, unspecified: Secondary | ICD-10-CM | POA: Diagnosis not present

## 2020-07-31 NOTE — Assessment & Plan Note (Addendum)
reported the patient's redness in white of eyes. The patient denied pain, itching in her eyes, denied vision change or trauma to her eyes. 07/31/20 it should heal, the patient declined artificial tears, update CBC/diff.

## 2020-07-31 NOTE — Progress Notes (Signed)
Location:    [facility] Room Number: 23 Place of Service:  ALF [phone]) Provider:  Marda Stalker, Lennie Odor NP  [name], MD  Patient Care Team: [name], MD as PCP - General (Internal Medicine) Rutherford Guys, MD as Consulting Physician (Ophthalmology) [name], MD as Consulting Physician (Internal Medicine)  Extended Emergency Contact Information Primary Emergency Contact: Hausen,Leonard Address: [address] [place] [address] Johnnette Litter of [place] Phone: [phone] Work Phone: [phone] Mobile Phone: [phone] Relation: Son Secondary Emergency Contact: [place] of [place] Phone: [phone] Relation: Friend  Code Status:  DNR Managed Care Goals of care: Advanced Directive information Advanced Directives 10/15/2019  Does Patient Have a Medical Advance Directive? Yes  Type of Paramedic of [place];Living will;Out of facility DNR (pink MOST or yellow form)  Does patient want to make changes to medical advance directive? No - Patient declined  Copy of [place] in Chart? Yes - validated most recent copy scanned in chart (See row information)  Would patient like information on creating a medical advance directive? -  Pre-existing out of facility DNR order (yellow form or pink MOST form) -     Chief Complaint  Patient presents with  . Acute Visit    Red eye    HPI:  Pt is a 87 y.o. female seen today for an acute visit for reported the patient's redness in white of eyes. The patient denied pain, itching in her eyes, denied vision change or trauma to her eyes.    The patient [place] [facility], ambulates with walker.  OA, pain in the right hip, worsened in morning, on Gabapentin 348m bid tid, Tylenol 50102mbid, Norco 5/32575mid.         CHF/edema, compensated, on Furosemide 37m23m. Bun/creat 25/1.23  01/31/20 Diet controlled T2DM, Hgb a1c 5.7, TSH 2.17 01/31/20             Unstable gait, ambulates with walker, slow.      Past Medical History:      Past Medical History:  Diagnosis Date  . Acute blood loss anemia   . Allergy   . Asthma   . AVM (arteriovenous malformation) of colon with hemorrhage   . Bursitis of right hip   . Cataract   . CKD (chronic kidney disease), stage III (HCC)[place]24/2017  . COLONIC POLYPS 03/26/2005   Qualifier: Diagnosis of  By: McMuTalbert Cage (AAMAVarnadoune    . DIVERTICULOSIS, COLON 03/26/2005   Qualifier: Diagnosis of  By: McMuTalbert Cage ([place]    . DVT (deep venous thrombosis), left 11/03/2015  . Ecchymosis 12/16/2015  . Hyperglycemia 12/16/2015  . Hyperlipidemia   . Hypertension   . Multiple skin tears 12/16/2015  . Osteopenia   . Stasis edema of both lower extremities 11/04/2015  . Unstable gait 12/16/2015  . Urine incontinence 12/16/2015   Past Surgical History:  Procedure Laterality Date  . ABDOMINAL HYSTERECTOMY  2006   Dr. Wes Maryelizabeth RowanCATARACT EXTRACTION W/ INTRAOCULAR LENS IMPLANT Bilateral 1999/2003   Dr. MarkRutherford GuysCOLONOSCOPY N/A 11/06/2015   Procedure: COLONOSCOPY;  Surgeon: KaviMauri Pole;  [facility]: [facility];  Service: Endoscopy;  Laterality: N/A;  MAC if available, otherwise moderate sedation    Allergies  Allergen Reactions  . Epinephrine Nausea Only    Unknown.  . HyMarland Kitchenrocodone-Acetaminophen  REACTION: nausea  . Labetalol   . Procaine     Allergies as of 07/31/2020      Reactions   Epinephrine Nausea Only   Unknown.   Hydrocodone-acetaminophen    REACTION: nausea   Labetalol    Procaine       Medication List       Accurate as of July 31, 2020 11:59 PM. If you have any questions, ask your nurse or doctor.        acetaminophen 500 MG tablet Commonly known as: TYLENOL Take 500 mg by mouth 2 (two) times daily.   calcium carbonate 500 MG chewable tablet Commonly known as: TUMS - dosed  in mg elemental calcium Chew 2 tablets by mouth every morning.   cholecalciferol 1000 units tablet Commonly known as: VITAMIN D Take 1,000 Units by mouth daily.   furosemide 20 MG tablet Commonly known as: LASIX Take 1 tablet (20 mg total) by mouth daily.   gabapentin 300 MG capsule Commonly known as: NEURONTIN Take 300 mg by mouth 2 (two) times daily.   HYDROcodone-acetaminophen 5-325 MG tablet Commonly known as: NORCO/VICODIN Take one tablet by mouth three times daily for pain   loperamide 2 MG tablet Commonly known as: IMODIUM A-D Take 4 mg by mouth as needed for diarrhea or loose stools. If 3 loose stools in 24 hours, hold all laxatives and stool softeners. Give Imodium AD (loperamide) 4 mg po initial dose, then 2 mg po after each loose stool X 48 hrs.(maximum of 83m in 24 hrs. If diarrhea continues, notify MD   multivitamin with minerals tablet Take 1 tablet by mouth daily.   ICAPS MV PO Take 1 tablet by mouth daily.   nystatin powder Commonly known as: MYCOSTATIN/NYSTOP Apply to groin 2 times daily as needed   Sodium Fluoride 1.1 % Pste Place onto teeth daily.       Review of Systems  Constitutional: Negative for activity change, appetite change and fever.  HENT: Positive for hearing loss. Negative for congestion and voice change.   Eyes: Positive for redness. Negative for pain, discharge, itching and visual disturbance.  Respiratory: Negative for cough and shortness of breath.   Cardiovascular: Positive for leg swelling.  Gastrointestinal: Negative for abdominal pain and constipation.  Genitourinary: Negative for dysuria, frequency and urgency.  Musculoskeletal: Positive for arthralgias, back pain and gait problem.       Lower back, right hip  Skin:       Reddened L heel.   Neurological: Negative for speech difficulty, weakness and headaches.       Memory lapses.   Psychiatric/Behavioral: Negative for behavioral problems and sleep disturbance. The patient  is not nervous/anxious.     Immunization History  Administered Date(s) Administered  . DTaP 05/13/2005  . Influenza, High Dose Seasonal PF 02/23/2019, 02/15/2020  . Influenza-Unspecified 02/11/2015, 02/22/2016, 02/19/2017, 02/16/2018  . Moderna Sars-Covid-2 Vaccination 05/17/2019, 06/14/2019, 03/27/2020  . PPD Test 12/08/2015, 12/22/2015  . Pneumococcal Conjugate-13 05/13/2014  . Pneumococcal Polysaccharide-23 05/13/2014, 03/17/2019  . Td 12/11/2005  . Tdap 01/04/2017  . Zoster 05/13/2014   Pertinent  Health Maintenance Due  Topic Date Due  . FOOT EXAM  Never done  . URINE MICROALBUMIN  07/07/2018  . HEMOGLOBIN A1C  07/30/2020  . OPHTHALMOLOGY EXAM  02/28/2021  . INFLUENZA VACCINE  Completed  . DEXA SCAN  Completed  . PNA vac Low Risk Adult  Completed   Fall Risk  01/05/2018 12/30/2016 11/03/2015  Falls in the past year? No  No No   Functional Status Survey:    Vitals:   07/31/20 1106  BP: (!) 149/70  Pulse: 72  Resp: 18  Temp: 97.7 F (36.5 C)  SpO2: 95%  Weight: 161 lb (73 kg)  Height: _0  (1.6 m)   Body mass index is 28.52 kg/m. Physical Exam Vitals and nursing note reviewed.  Constitutional:      Appearance: Normal appearance.     Comments: Over weight  HENT:     Head: Normocephalic and atraumatic.     Mouth/Throat:     Mouth: Mucous membranes are moist.  Eyes:     General:        Right eye: No discharge.        Left eye: No discharge.     Extraocular Movements: Extraocular movements intact.     Pupils: Pupils are equal, round, and reactive to light.     Comments: Left eye subconjunctival hemorrhage.   Cardiovascular:     Rate and Rhythm: Normal rate and regular rhythm.     Heart sounds: No murmur heard.   Pulmonary:     Breath sounds: No rales.     Comments: Bibasilar rales.  Abdominal:     Palpations: Abdomen is soft.     Tenderness: There is no abdominal tenderness.  Musculoskeletal:     Cervical back: Normal range of motion and neck  supple.     Right lower leg: Edema present.     Left lower leg: Edema present.     Comments: Trace edema BLE, ambulates with walker.   Skin:    General: Skin is warm and dry.     Comments: Reddened L heel, non blanchable, pain is resolved after skin prep applied.   Neurological:     General: No focal deficit present.     Mental Status: She is alert and oriented to person, place, and time. Mental status is at baseline.     Motor: No weakness.     Gait: Gait abnormal.  Psychiatric:        Mood and Affect: Mood normal.        Behavior: Behavior normal.        Thought Content: Thought content normal.        Judgment: Judgment normal.     Labs reviewed: Recent Labs    01/31/20 0000  NA 144  K 4.4  CL 107  CO2 29*  BUN 25*  CREATININE 1.2*  CALCIUM 9.5   Recent Labs    01/31/20 0000  AST 20  ALT 8  ALKPHOS 46  ALBUMIN 3.9   Recent Labs    01/31/20 0000  WBC 5.5  NEUTROABS 2,536.00  HGB 13.3  HCT 41  PLT 196   Lab Results  Component Value Date   TSH 2.17 01/31/2020   Lab Results  Component Value Date   HGBA1C 5.7 01/31/2020   Lab Results  Component Value Date   CHOL 211 (A) 06/16/2017   HDL 74 (A) 06/16/2017   LDLCALC 117 06/16/2017   TRIG 96 06/16/2017    Significant Diagnostic Results in last 30 days:  No results found.  Assessment/Plan Subconjunctival hemorrhage of left eye reported the patient's redness in white of eyes. The patient denied pain, itching in her eyes, denied vision change or trauma to her eyes. 07/31/20 it should heal, the patient declined artificial tears, update CBC/diff.   Edema CHF/edema, compensated, on Furosemide 73m qd. Bun/creat 25/1.23 01/31/20. Update CMP/eGFR  Type 2 diabetes  mellitus with diabetic chronic kidney disease ([place]) Diet controlled T2DM, Hgb a1c 5.7, TSH 2.17 01/31/20, update Hgb a1c.   Unstable gait Ambulates with walker on unit.   Osteoarthritis involving multiple joints on both sides of body OA, pain  in the right hip, worsened in morning, on Gabapentin 383m bid tid, Tylenol 5061mbid, Norco 5/32566mid.   Family/ staff Communication: plan of care reviewed with the patient and charge nurse.   Labs/tests ordered: CBC/diff, CMP/eGFR, Hgb a1c.   Time spend 40 minutes.

## 2020-07-31 NOTE — Assessment & Plan Note (Signed)
CHF/edema, compensated, on Furosemide 20mg qd. Bun/creat 25/1.23 01/31/20. Update CMP/eGFR 

## 2020-07-31 NOTE — Assessment & Plan Note (Signed)
Ambulates with walker on unit.

## 2020-07-31 NOTE — Assessment & Plan Note (Signed)
Diet controlled T2DM, Hgb a1c 5.7, TSH 2.17 01/31/20, update Hgb a1c.

## 2020-07-31 NOTE — Assessment & Plan Note (Signed)
OA, pain in the right hip, worsened in morning, on Gabapentin 300mg  bid tid, Tylenol 500mg  bid, Norco 5/325mg  tid.

## 2020-08-01 ENCOUNTER — Encounter: Payer: Self-pay | Admitting: Nurse Practitioner

## 2020-08-03 ENCOUNTER — Encounter: Payer: Self-pay | Admitting: Internal Medicine

## 2020-08-03 ENCOUNTER — Non-Acute Institutional Stay: Payer: Medicare Other | Admitting: Internal Medicine

## 2020-08-03 DIAGNOSIS — D649 Anemia, unspecified: Secondary | ICD-10-CM | POA: Diagnosis not present

## 2020-08-03 DIAGNOSIS — H1132 Conjunctival hemorrhage, left eye: Secondary | ICD-10-CM | POA: Diagnosis not present

## 2020-08-03 DIAGNOSIS — M79672 Pain in left foot: Secondary | ICD-10-CM

## 2020-08-03 DIAGNOSIS — E1122 Type 2 diabetes mellitus with diabetic chronic kidney disease: Secondary | ICD-10-CM

## 2020-08-03 DIAGNOSIS — R609 Edema, unspecified: Secondary | ICD-10-CM

## 2020-08-03 DIAGNOSIS — N1831 Chronic kidney disease, stage 3a: Secondary | ICD-10-CM

## 2020-08-03 DIAGNOSIS — R7309 Other abnormal glucose: Secondary | ICD-10-CM | POA: Diagnosis not present

## 2020-08-03 LAB — CBC AND DIFFERENTIAL
HCT: 39 (ref 36–46)
Hemoglobin: 12.4 (ref 12.0–16.0)
WBC: 4.6

## 2020-08-03 LAB — BASIC METABOLIC PANEL
BUN: 33 — AB (ref 4–21)
CO2: 24 — AB (ref 13–22)
Chloride: 105 (ref 99–108)
Creatinine: 1.3 — AB (ref 0.5–1.1)
Glucose: 76
Potassium: 4 (ref 3.4–5.3)
Sodium: 142 (ref 137–147)

## 2020-08-03 LAB — COMPREHENSIVE METABOLIC PANEL
Albumin: 3.7 (ref 3.5–5.0)
Calcium: 8.9 (ref 8.7–10.7)
Globulin: 2.2

## 2020-08-03 LAB — HEPATIC FUNCTION PANEL
ALT: 8 (ref 7–35)
AST: 19 (ref 13–35)
Alkaline Phosphatase: 36 (ref 25–125)
Bilirubin, Total: 0.4

## 2020-08-03 LAB — CBC: RBC: 4.45 (ref 3.87–5.11)

## 2020-08-03 LAB — HEMOGLOBIN A1C: Hemoglobin A1C: 5.7

## 2020-08-03 NOTE — Progress Notes (Signed)
Location:    [facility] Room Number: 23 Place of Service:  ALF [phone]) Provider: Veleta Miners MD   [name], MD  Patient Care Team: [name], MD as PCP - General (Internal Medicine) Rutherford Guys, MD as Consulting Physician (Ophthalmology) [name], MD as Consulting Physician (Internal Medicine)  Extended Emergency Contact Information Primary Emergency Contact: Azure,Leonard Address: [address] [place] [address] Johnnette Litter of [place] Phone: [phone] Work Phone: [phone] Mobile Phone: [phone] Relation: Son Secondary Emergency Contact: Gerarda Fraction [place] of [place] Phone: [phone] Relation: Friend  Code Status:  DNR Managed Care Goals of care: Advanced Directive information Advanced Directives 08/03/2020  Does Patient Have a Medical Advance Directive? Yes  Type of Paramedic of [place];Living will;Out of facility DNR (pink MOST or yellow form)  Does patient want to make changes to medical advance directive? No - Patient declined  Copy of [place] in Chart? Yes - validated most recent copy scanned in chart (See row information)  Would patient like information on creating a medical advance directive? -  Pre-existing out of facility DNR order (yellow form or pink MOST form) Pink MOST form placed in chart (order not valid for inpatient use);Yellow form placed in chart (order not valid for inpatient use)     Chief Complaint  Patient presents with  . Medical Management of Chronic Issues  . Health Maintenance    Foot exam, urine microalbumin, hemoglobin A1C    HPI:  Pt is a 87 y.o. female seen today for medical management of chronic diseases.    Patient has h/o Hypertension, Diabetes Mellitus, H/o CKD,, Low back Pain and Arthritis  Lives in [place] with her walker Weight stable  Recently has been having Pain In her Left  Heel. Says Pain is in the morning when she starts walking but get better later. No Other issues Independent in her ADLS and IADSLS Twin son Out of state in  and [place]   Past Medical History:  Diagnosis Date  . Acute blood loss anemia   . Allergy   . Asthma   . AVM (arteriovenous malformation) of colon with hemorrhage   . Bursitis of right hip   . Cataract   . CKD (chronic kidney disease), stage III ([place]) 11/04/2015  . COLONIC POLYPS 03/26/2005   Qualifier: Diagnosis of  By: Talbert Cage CMA ([place]), June    . DIVERTICULOSIS, COLON 03/26/2005   Qualifier: Diagnosis of  By: Talbert Cage CMA ([place]), June    . DVT (deep venous thrombosis), left 11/03/2015  . Ecchymosis 12/16/2015  . Hyperglycemia 12/16/2015  . Hyperlipidemia   . Hypertension   . Multiple skin tears 12/16/2015  . Osteopenia   . Stasis edema of both lower extremities 11/04/2015  . Unstable gait 12/16/2015  . Urine incontinence 12/16/2015   Past Surgical History:  Procedure Laterality Date  . ABDOMINAL HYSTERECTOMY  2006   Dr. Maryelizabeth Rowan  . CATARACT EXTRACTION W/ INTRAOCULAR LENS IMPLANT Bilateral 1999/2003   Dr. Rutherford Guys  . COLONOSCOPY N/A 11/06/2015   Procedure: COLONOSCOPY;  Surgeon: Mauri Pole, MD;  [facility];  Service: Endoscopy;  Laterality: N/A;  MAC if available, otherwise moderate sedation    Allergies  Allergen Reactions  . Epinephrine Nausea Only    Unknown.  Marland Kitchen Hydrocodone-Acetaminophen     REACTION: nausea  . Labetalol   . Procaine  Allergies as of 08/03/2020      Reactions   Epinephrine Nausea Only   Unknown.   Hydrocodone-acetaminophen    REACTION: nausea   Labetalol    Procaine       Medication List       Accurate as of August 03, 2020  9:26 AM. If you have any questions, ask your nurse or doctor.        acetaminophen 500 MG tablet Commonly known as: TYLENOL Take 500 mg by mouth 2 (two) times daily.   calcium carbonate 500 MG chewable tablet Commonly known as:  TUMS - dosed in mg elemental calcium Chew 2 tablets by mouth every morning.   cholecalciferol 1000 units tablet Commonly known as: VITAMIN D Take 1,000 Units by mouth daily.   furosemide 20 MG tablet Commonly known as: LASIX Take 1 tablet (20 mg total) by mouth daily.   gabapentin 300 MG capsule Commonly known as: NEURONTIN Take 300 mg by mouth 2 (two) times daily.   HYDROcodone-acetaminophen 5-325 MG tablet Commonly known as: NORCO/VICODIN Take one tablet by mouth three times daily for pain   loperamide 2 MG tablet Commonly known as: IMODIUM A-D Take 4 mg by mouth as needed for diarrhea or loose stools. If 3 loose stools in 24 hours, hold all laxatives and stool softeners. Give Imodium AD (loperamide) 4 mg po initial dose, then 2 mg po after each loose stool X 48 hrs.(maximum of 58m in 24 hrs. If diarrhea continues, notify MD   multivitamin with minerals tablet Take 1 tablet by mouth daily.   ICAPS MV PO Take 1 tablet by mouth daily.   nystatin powder Commonly known as: MYCOSTATIN/NYSTOP Apply to groin 2 times daily as needed   Sodium Fluoride 1.1 % Pste Place onto teeth daily.       Review of Systems  Review of Systems  Constitutional: Negative for activity change, appetite change, chills, diaphoresis, fatigue and fever.  HENT: Negative for mouth sores, postnasal drip, rhinorrhea, sinus pain and sore throat.   Respiratory: Negative for apnea, cough, chest tightness, shortness of breath and wheezing.   Cardiovascular: Negative for chest pain, palpitations and leg swelling.  Gastrointestinal: Negative for abdominal distention, abdominal pain, constipation, diarrhea, nausea and vomiting.  Genitourinary: Negative for dysuria and frequency.  Musculoskeletal: Negative for arthralgias, joint swelling and myalgias.  Skin: Negative for rash.  Neurological: Negative for dizziness, syncope, weakness, light-headedness and numbness.  Psychiatric/Behavioral: Negative for  behavioral problems, confusion and sleep disturbance.     Immunization History  Administered Date(s) Administered  . DTaP 05/13/2005  . Influenza, High Dose Seasonal PF 02/23/2019, 02/15/2020  . Influenza-Unspecified 02/11/2015, 02/22/2016, 02/19/2017, 02/16/2018  . Moderna Sars-Covid-2 Vaccination 05/17/2019, 06/14/2019, 03/27/2020  . PPD Test 12/08/2015, 12/22/2015  . Pneumococcal Conjugate-13 05/13/2014  . Pneumococcal Polysaccharide-23 05/13/2014, 03/17/2019  . Td 12/11/2005  . Tdap 01/04/2017  . Zoster 05/13/2014   Pertinent  Health Maintenance Due  Topic Date Due  . FOOT EXAM  Never done  . URINE MICROALBUMIN  07/07/2018  . HEMOGLOBIN A1C  07/30/2020  . OPHTHALMOLOGY EXAM  02/28/2021  . INFLUENZA VACCINE  Completed  . DEXA SCAN  Completed  . PNA vac Low Risk Adult  Completed   Fall Risk  01/05/2018 12/30/2016 11/03/2015  Falls in the past year? No No No   Functional Status Survey:    Vitals:   08/03/20 0922  BP: (!) 123/55  Pulse: 63  Resp: 20  Temp: (!) 97 F (36.1 C)  SpO2:  96%  Weight: 161 lb (73 kg)  Height: _0  (1.6 m)   Body mass index is 28.52 kg/m. Physical Exam Constitutional: Oriented to person, place, and time. Well-developed and well-nourished.  HENT:  Head: Normocephalic.  Mouth/Throat: Oropharynx is clear and moist.  Eyes: Pupils are equal, round, and reactive to light.  Neck: Neck supple.  Cardiovascular: Normal rate and normal heart sounds.  No murmur heard. Pulmonary/Chest: Effort normal and breath sounds normal. No respiratory distress. No wheezes. She has no rales.  Abdominal: Soft. Bowel sounds are normal. No distension. There is no tenderness. There is no rebound.  Musculoskeletal: Foot Exam Just had Dry and thick Skin in the heel. No Open Wounds Lymphadenopathy: none Neurological: Alert and oriented to person, place, and time. No Focal Deficits Skin: Skin is warm and dry.  Psychiatric: Normal mood and affect. Behavior is normal.  Thought content normal.   Labs reviewed: Recent Labs    01/31/20 0000  NA 144  K 4.4  CL 107  CO2 29*  BUN 25*  CREATININE 1.2*  CALCIUM 9.5   Recent Labs    01/31/20 0000  AST 20  ALT 8  ALKPHOS 46  ALBUMIN 3.9   Recent Labs    01/31/20 0000  WBC 5.5  NEUTROABS 2,536.00  HGB 13.3  HCT 41  PLT 196   Lab Results  Component Value Date   TSH 2.17 01/31/2020   Lab Results  Component Value Date   HGBA1C 5.7 01/31/2020   Lab Results  Component Value Date   CHOL 211 (A) 06/16/2017   HDL 74 (A) 06/16/2017   LDLCALC 117 06/16/2017   TRIG 96 06/16/2017    Significant Diagnostic Results in last 30 days:  No results found.  Assessment/Plan  Pain of left heel ? Plantar Fascitis Referal made to Podiatry  Subconjunctival hemorrhage of left eye Doing well Edema, unspecified type On Lasix Repeat Labs Pending Type 2 diabetes mellitus with chronic kidney disease, without long-term current use of insulin, unspecified CKD stage ([place]) Not on any meds Repeat Labs pending Osteoarthritis of spine with radiculopathy, lumbar region On Neurontin and Norco  Labs Pending Family/ staff Communication:   Labs/tests ordered:

## 2020-08-04 ENCOUNTER — Other Ambulatory Visit: Payer: Self-pay

## 2020-08-04 DIAGNOSIS — M25511 Pain in right shoulder: Secondary | ICD-10-CM

## 2020-08-04 DIAGNOSIS — G8929 Other chronic pain: Secondary | ICD-10-CM

## 2020-08-04 DIAGNOSIS — M25551 Pain in right hip: Secondary | ICD-10-CM

## 2020-08-04 DIAGNOSIS — M5416 Radiculopathy, lumbar region: Secondary | ICD-10-CM

## 2020-08-04 NOTE — Telephone Encounter (Signed)
Refill request received from [facility] for Hydrocodonr 5/325 mg Medication pended and sent to Dr. Lyndel Safe for approval.

## 2020-08-07 MED ORDER — HYDROCODONE-ACETAMINOPHEN 5-325 MG PO TABS: ORAL_TABLET | ORAL | 0 refills | Status: DC

## 2020-08-28 ENCOUNTER — Non-Acute Institutional Stay: Payer: Medicare Other | Admitting: Orthopedic Surgery

## 2020-08-28 ENCOUNTER — Encounter: Payer: Self-pay | Admitting: Orthopedic Surgery

## 2020-08-28 DIAGNOSIS — H1132 Conjunctival hemorrhage, left eye: Secondary | ICD-10-CM

## 2020-08-28 DIAGNOSIS — I5032 Chronic diastolic (congestive) heart failure: Secondary | ICD-10-CM | POA: Diagnosis not present

## 2020-08-28 DIAGNOSIS — R2681 Unsteadiness on feet: Secondary | ICD-10-CM

## 2020-08-28 DIAGNOSIS — E1122 Type 2 diabetes mellitus with diabetic chronic kidney disease: Secondary | ICD-10-CM

## 2020-08-28 DIAGNOSIS — M159 Polyosteoarthritis, unspecified: Secondary | ICD-10-CM

## 2020-08-28 DIAGNOSIS — G8929 Other chronic pain: Secondary | ICD-10-CM | POA: Diagnosis not present

## 2020-08-28 DIAGNOSIS — M79672 Pain in left foot: Secondary | ICD-10-CM

## 2020-08-28 DIAGNOSIS — M5416 Radiculopathy, lumbar region: Secondary | ICD-10-CM | POA: Diagnosis not present

## 2020-08-28 NOTE — Progress Notes (Signed)
Location:  [facility] Room Number: 23 Place of Service:  ALF [phone]) Provider: Windell Moulding, AGNP-C  [name], MD  Patient Care Team: [name], MD as PCP - General (Internal Medicine) Rutherford Guys, MD as Consulting Physician (Ophthalmology) [name], MD as Consulting Physician (Internal Medicine)  Extended Emergency Contact Information Primary Emergency Contact: Mcconathy,Leonard Address: [address] [place] [address] Johnnette Litter of [place] Phone: [phone] Work Phone: [phone] Mobile Phone: [phone] Relation: Son Secondary Emergency Contact: Gerarda Fraction [place] of [place] Phone: [phone] Relation: Friend  Goals of care: Advanced Directive information Advanced Directives 08/03/2020  Does Patient Have a Medical Advance Directive? Yes  Type of Paramedic of [place];Living will;Out of facility DNR (pink MOST or yellow form)  Does patient want to make changes to medical advance directive? No - Patient declined  Copy of [place] in Chart? Yes - validated most recent copy scanned in chart (See row information)  Would patient like information on creating a medical advance directive? -  Pre-existing out of facility DNR order (yellow form or pink MOST form) Pink MOST form placed in chart (order not valid for inpatient use);Yellow form placed in chart (order not valid for inpatient use)     Chief Complaint  Patient presents with  . Medical Management of Chronic Issues    HPI:  Pt is a 87 y.o. female seen today for medical management of chronic diseases.    Today she is sitting in her recliner reading. She is alert and oriented x 4. Follows commands and can express needs. Her sons just flew from [place] to visit her last week. Sad they have gone, but happy to have had a nice visit with them. Continues to stay social with other and  attends daily activities. No back pain at this time. Left heel continues to be bothersome in morning when she first moves around. Pain subsides with movement throughout the day. Waiting to be seen by podiatry. She denies chest pain or sob.   No recent falls or injuries. Continues to ambulate with rolator.   Recent blood pressures are as follows.   04/13- 105/62  04/06- 110/55  03/30- 122/51  Recent weights are as follows:  04/02- 162 lbs  03/06- 161 lbs  02/01- 162.6 lbs  01/05- 164 lbs   Nurse does not report any concerns, vitals stable.  Past Medical History:  Diagnosis Date  . Acute blood loss anemia   . Allergy   . Asthma   . AVM (arteriovenous malformation) of colon with hemorrhage   . Bursitis of right hip   . Cataract   . CKD (chronic kidney disease), stage III ([place]) 11/04/2015  . COLONIC POLYPS 03/26/2005   Qualifier: Diagnosis of  By: Talbert Cage CMA ([place]), June    . DIVERTICULOSIS, COLON 03/26/2005   Qualifier: Diagnosis of  By: Talbert Cage CMA ([place]), June    . DVT (deep venous thrombosis), left 11/03/2015  . Ecchymosis 12/16/2015  . Hyperglycemia 12/16/2015  . Hyperlipidemia   . Hypertension   . Multiple skin tears 12/16/2015  . Osteopenia   . Stasis edema of both lower extremities 11/04/2015  . Unstable gait 12/16/2015  . Urine incontinence 12/16/2015   Past Surgical History:  Procedure Laterality Date  . ABDOMINAL HYSTERECTOMY  2006   Dr. Maryelizabeth Rowan  . CATARACT EXTRACTION W/ INTRAOCULAR LENS IMPLANT Bilateral 1999/2003  Dr. Rutherford Guys  . COLONOSCOPY N/A 11/06/2015   Procedure: COLONOSCOPY;  Surgeon: Mauri Pole, MD;  Location: [facility];  Service: Endoscopy;  Laterality: N/A;  MAC if available, otherwise moderate sedation    Allergies  Allergen Reactions  . Epinephrine Nausea Only    Unknown.  Marland Kitchen Hydrocodone-Acetaminophen     REACTION: nausea  . Labetalol   . Procaine     Outpatient Encounter Medications as of 08/28/2020  Medication Sig  .  acetaminophen (TYLENOL) 500 MG tablet Take 500 mg by mouth 2 (two) times daily.   . calcium carbonate (TUMS - DOSED IN MG ELEMENTAL CALCIUM) 500 MG chewable tablet Chew 2 tablets by mouth every morning.   . cholecalciferol (VITAMIN D) 1000 units tablet Take 1,000 Units by mouth daily.  . furosemide (LASIX) 20 MG tablet Take 1 tablet (20 mg total) by mouth daily.  Marland Kitchen gabapentin (NEURONTIN) 300 MG capsule Take 300 mg by mouth 2 (two) times daily.  Marland Kitchen HYDROcodone-acetaminophen (NORCO/VICODIN) 5-325 MG tablet Take one tablet by mouth three times daily for pain  . loperamide (IMODIUM A-D) 2 MG tablet Take 4 mg by mouth as needed for diarrhea or loose stools. If 3 loose stools in 24 hours, hold all laxatives and stool softeners. Give Imodium AD (loperamide) 4 mg po initial dose, then 2 mg po after each loose stool X 48 hrs.(maximum of 15m in 24 hrs. If diarrhea continues, notify MD  . Multiple Vitamins-Minerals (ICAPS MV PO) Take 1 tablet by mouth daily.  . Multiple Vitamins-Minerals (MULTIVITAMIN WITH MINERALS) tablet Take 1 tablet by mouth daily.   .Marland Kitchennystatin (MYCOSTATIN/NYSTOP) powder Apply to groin 2 times daily as needed  . Sodium Fluoride 1.1 % PSTE Place onto teeth daily.   No facility-administered encounter medications on file as of 08/28/2020.    Review of Systems  Constitutional: Negative for activity change, appetite change, fatigue and fever.  HENT: Negative for dental problem, hearing loss and trouble swallowing.   Eyes: Negative for visual disturbance.       Glasses  Respiratory: Negative for cough, shortness of breath and wheezing.   Cardiovascular: Negative for chest pain and leg swelling.  Gastrointestinal: Negative for abdominal distention, abdominal pain, constipation, diarrhea and nausea.  Genitourinary: Negative for dysuria, frequency and hematuria.  Musculoskeletal: Positive for back pain.       Left heel pain  Skin:       Dry skin  Neurological: Positive for weakness.  Negative for dizziness and headaches.  Hematological: Bruises/bleeds easily.  Psychiatric/Behavioral: Negative for confusion, dysphoric mood and sleep disturbance. The patient is not nervous/anxious.     Immunization History  Administered Date(s) Administered  . DTaP 05/13/2005  . Influenza, High Dose Seasonal PF 02/23/2019, 02/15/2020  . Influenza-Unspecified 02/11/2015, 02/22/2016, 02/19/2017, 02/16/2018  . Moderna Sars-Covid-2 Vaccination 05/17/2019, 06/14/2019, 03/27/2020  . PPD Test 12/08/2015, 12/22/2015  . Pneumococcal Conjugate-13 05/13/2014  . Pneumococcal Polysaccharide-23 05/13/2014, 03/17/2019  . Td 12/11/2005  . Tdap 01/04/2017  . Zoster 05/13/2014   Pertinent  Health Maintenance Due  Topic Date Due  . FOOT EXAM  Never done  . URINE MICROALBUMIN  07/07/2018  . HEMOGLOBIN A1C  07/30/2020  . INFLUENZA VACCINE  12/11/2020  . OPHTHALMOLOGY EXAM  02/28/2021  . DEXA SCAN  Completed  . PNA vac Low Risk Adult  Completed   Fall Risk  01/05/2018 12/30/2016 11/03/2015  Falls in the past year? No No No   Functional Status Survey:    Vitals:   08/28/20  1620  BP: 105/62  Pulse: 65  Resp: 18  Temp: (!) 97.5 F (36.4 C)  SpO2: 97%  Weight: 162 lb (73.5 kg)   Body mass index is 28.7 kg/m. Physical Exam Vitals reviewed.  Constitutional:      General: She is not in acute distress. HENT:     Head: Normocephalic.     Right Ear: There is no impacted cerumen.     Left Ear: There is no impacted cerumen.     Nose: Nose normal.     Mouth/Throat:     Mouth: Mucous membranes are moist.     Pharynx: No posterior oropharyngeal erythema.  Eyes:     General:        Right eye: No discharge.        Left eye: No discharge.  Cardiovascular:     Rate and Rhythm: Normal rate and regular rhythm.     Pulses: Normal pulses.     Heart sounds: Murmur heard.    Pulmonary:     Effort: Pulmonary effort is normal. No respiratory distress.     Breath sounds: Normal breath sounds. No  wheezing.  Abdominal:     General: Bowel sounds are normal. There is no distension.     Palpations: Abdomen is soft.     Tenderness: There is no abdominal tenderness.  Musculoskeletal:     Cervical back: Normal range of motion.     Right lower leg: No edema.     Left lower leg: No edema.     Comments: Left heel FROM, dorsal flexion 5/5. Dry skin noted. No apparent injury. 1+ pedal pulse  Lymphadenopathy:     Cervical: No cervical adenopathy.  Skin:    General: Skin is warm and dry.     Capillary Refill: Capillary refill takes less than 2 seconds.  Neurological:     General: No focal deficit present.     Mental Status: She is alert and oriented to person, place, and time.     Motor: Weakness present.     Gait: Gait abnormal.     Comments: walker  Psychiatric:        Mood and Affect: Mood normal.        Behavior: Behavior normal.     Labs reviewed: Recent Labs    01/31/20 0000  NA 144  K 4.4  CL 107  CO2 29*  BUN 25*  CREATININE 1.2*  CALCIUM 9.5   Recent Labs    01/31/20 0000  AST 20  ALT 8  ALKPHOS 46  ALBUMIN 3.9   Recent Labs    01/31/20 0000  WBC 5.5  NEUTROABS 2,536.00  HGB 13.3  HCT 41  PLT 196   Lab Results  Component Value Date   TSH 2.17 01/31/2020   Lab Results  Component Value Date   HGBA1C 5.7 01/31/2020   Lab Results  Component Value Date   CHOL 211 (A) 06/16/2017   HDL 74 (A) 06/16/2017   LDLCALC 117 06/16/2017   TRIG 96 06/16/2017    Significant Diagnostic Results in last 30 days:  No results found.  Assessment/Plan: 1. Pain of left heel - ongoing, pain with first steps then resolves, suspect OA - cont tylenol 500 mg po bid - awaiting to see in house podiatrist  2. Subconjunctival hemorrhage of left eye - resolved  3. Type 2 diabetes mellitus with chronic kidney disease, without long-term current use of insulin, unspecified CKD stage (HCC) - A1c 5.7, not on medications  at this time - cont diet low in carbs and  sugars - continue to avoid nephrotoxic drugs like NSAIDS and dose adjust medications to be renally excreted  4. Unstable gait - no recent falls, cont ambulating with walker  5. Osteoarthritis involving multiple joints on both sides of body - ongoing, body stiff in AM, improves with movement - cont tylenol regimen  6. Chronic diastolic congestive heart failure (HCC) - no recent weight fluctuations, sob or pitting edema -cont lasix regimen  7. Chronic radicular pain of lower back - denies pain today - cont tylenol, gabapentin,and norco regimen    Family/ staff Communication: plan discussed with patient and nurse Labs/tests ordered:  none

## 2020-09-01 DIAGNOSIS — M79672 Pain in left foot: Secondary | ICD-10-CM | POA: Diagnosis not present

## 2020-09-01 DIAGNOSIS — L602 Onychogryphosis: Secondary | ICD-10-CM | POA: Diagnosis not present

## 2020-09-01 DIAGNOSIS — M79671 Pain in right foot: Secondary | ICD-10-CM | POA: Diagnosis not present

## 2020-09-01 DIAGNOSIS — L84 Corns and callosities: Secondary | ICD-10-CM | POA: Diagnosis not present

## 2020-09-04 ENCOUNTER — Other Ambulatory Visit: Payer: Self-pay | Admitting: *Deleted

## 2020-09-04 DIAGNOSIS — M25551 Pain in right hip: Secondary | ICD-10-CM

## 2020-09-04 DIAGNOSIS — G8929 Other chronic pain: Secondary | ICD-10-CM

## 2020-09-04 MED ORDER — HYDROCODONE-ACETAMINOPHEN 5-325 MG PO TABS: ORAL_TABLET | ORAL | 0 refills | Status: DC

## 2020-09-04 NOTE — Telephone Encounter (Signed)
[facility] Requested refill Pended Rx and sent to Amy for approval.

## 2020-10-04 ENCOUNTER — Other Ambulatory Visit: Payer: Self-pay | Admitting: *Deleted

## 2020-10-04 DIAGNOSIS — M25511 Pain in right shoulder: Secondary | ICD-10-CM

## 2020-10-04 DIAGNOSIS — G8929 Other chronic pain: Secondary | ICD-10-CM

## 2020-10-04 DIAGNOSIS — M25551 Pain in right hip: Secondary | ICD-10-CM

## 2020-10-04 MED ORDER — HYDROCODONE-ACETAMINOPHEN 5-325 MG PO TABS: ORAL_TABLET | ORAL | 0 refills | Status: DC

## 2020-10-04 NOTE — Telephone Encounter (Signed)
Received refill Request from [facility] and sent to Amy for approval.

## 2020-10-25 ENCOUNTER — Non-Acute Institutional Stay: Payer: Medicare Other | Admitting: Orthopedic Surgery

## 2020-10-25 ENCOUNTER — Encounter: Payer: Self-pay | Admitting: Orthopedic Surgery

## 2020-10-25 DIAGNOSIS — R2681 Unsteadiness on feet: Secondary | ICD-10-CM | POA: Diagnosis not present

## 2020-10-25 DIAGNOSIS — N1831 Chronic kidney disease, stage 3a: Secondary | ICD-10-CM | POA: Diagnosis not present

## 2020-10-25 DIAGNOSIS — I5032 Chronic diastolic (congestive) heart failure: Secondary | ICD-10-CM

## 2020-10-25 DIAGNOSIS — M5416 Radiculopathy, lumbar region: Secondary | ICD-10-CM | POA: Diagnosis not present

## 2020-10-25 DIAGNOSIS — E1122 Type 2 diabetes mellitus with diabetic chronic kidney disease: Secondary | ICD-10-CM

## 2020-10-25 DIAGNOSIS — G8929 Other chronic pain: Secondary | ICD-10-CM

## 2020-10-25 DIAGNOSIS — M159 Polyosteoarthritis, unspecified: Secondary | ICD-10-CM | POA: Diagnosis not present

## 2020-10-25 DIAGNOSIS — M25551 Pain in right hip: Secondary | ICD-10-CM | POA: Diagnosis not present

## 2020-10-25 NOTE — Progress Notes (Signed)
Location:  [facility] Room Number: 23 Place of Service:  ALF [phone]) Provider: Windell Moulding, AGNP-C  [name], MD  Patient Care Team: [name], MD as PCP - General (Internal Medicine) Rutherford Guys, MD as Consulting Physician (Ophthalmology) [name], MD as Consulting Physician (Internal Medicine)  Extended Emergency Contact Information Primary Emergency Contact: Goetting,Leonard Address: [address] [address] Johnnette Litter of [place] Phone: [phone] Work Phone: [phone] Mobile Phone: [phone] Relation: Son Secondary Emergency Contact: Gerarda Fraction [place] of [place] Phone: [phone] Relation: Friend  Code Status:  DNR Goals of care: Advanced Directive information Advanced Directives 08/03/2020  Does Patient Have a Medical Advance Directive? Yes  Type of Paramedic of [place];Living will;Out of facility DNR (pink MOST or yellow form)  Does patient want to make changes to medical advance directive? No - Patient declined  Copy of [place] in Chart? Yes - validated most recent copy scanned in chart (See row information)  Would patient like information on creating a medical advance directive? -  Pre-existing out of facility DNR order (yellow form or pink MOST form) Pink MOST form placed in chart (order not valid for inpatient use);Yellow form placed in chart (order not valid for inpatient use)     Chief Complaint  Patient presents with   Medical Management of Chronic Issues    HPI:  Pt is a 87 y.o. female seen today for medical management of chronic diseases.    She resides in assistive living at [facility]. Past medical history includes: diastolic heart failure, hypertension r/t CKD stage III, PVD, diabetes, OA, back pain, neuropathy, hyperlipidemia and unstable gait.   Left heel- denies pain, callus formed, nurses apply  skin prep daily to avoid cracking.  HTN- BUN/Creat 33/1.27 08/03/2020, no meds.  CKD III-  see above CHF- weights stable, lasix 20 mg daily. Denies chest pain or sob.  Diabetes- A1c 5.7 08/03/2020, no meds, carb modified diet.  Back pain- ongoing, mid lower back and lower right side bothersome, no increased pain, some neuropathy, tylenol 500 mg bid, norco 5/325 mg tid, gabapentin 300 mg bid.  Right hip pain- seen by physical medicine and rehab abut 3 years ago, ambulates well with walker, no recent falls or injuries, pain tolerable with daily meds of tylenol, norco and gabapentin.   Eating about 75-100% of most meals. Denies lack of appetite.   Received new glasses about 3 months ago, no changes in vision. She is very happy with her new prescription.   Recent blood pressures:   06/08- 116/52  06/01- 128/64  05/25- 147/64   Recent weights:  06/06- 159.8 lbs  05/01- 161.8 lbs  04/02- 162 lbs  Nurse does not report any concerns, vitals stable.    Past Medical History:  Diagnosis Date   Acute blood loss anemia    Allergy    Asthma    AVM (arteriovenous malformation) of colon with hemorrhage    Bursitis of right hip    Cataract    CKD (chronic kidney disease), stage III ([place]) 11/04/2015   COLONIC POLYPS 03/26/2005   Qualifier: Diagnosis of  By: Talbert Cage CMA ([facility]), June     DIVERTICULOSIS, COLON 03/26/2005   Qualifier: Diagnosis of  By: Talbert Cage CMA Deborra Medina), June     DVT (deep venous thrombosis), left 11/03/2015   Ecchymosis 12/16/2015   Hyperglycemia 12/16/2015  Hyperlipidemia    Hypertension    Multiple skin tears 12/16/2015   Osteopenia    Stasis edema of both lower extremities 11/04/2015   Unstable gait 12/16/2015   Urine incontinence 12/16/2015   Past Surgical History:  Procedure Laterality Date   ABDOMINAL HYSTERECTOMY  2006   Dr. Maryelizabeth Rowan   CATARACT EXTRACTION W/ INTRAOCULAR LENS IMPLANT Bilateral 1999/2003   Dr. Rutherford Guys   COLONOSCOPY N/A 11/06/2015   Procedure:  COLONOSCOPY;  Surgeon: Mauri Pole, MD;  Location: [facility];  Service: Endoscopy;  Laterality: N/A;  MAC if available, otherwise moderate sedation    Allergies  Allergen Reactions   Epinephrine Nausea Only    Unknown.   Hydrocodone-Acetaminophen     REACTION: nausea   Labetalol    Procaine     Outpatient Encounter Medications as of 10/25/2020  Medication Sig   acetaminophen (TYLENOL) 500 MG tablet Take 500 mg by mouth 2 (two) times daily.    calcium carbonate (TUMS - DOSED IN MG ELEMENTAL CALCIUM) 500 MG chewable tablet Chew 2 tablets by mouth every morning.    cholecalciferol (VITAMIN D) 1000 units tablet Take 1,000 Units by mouth daily.   furosemide (LASIX) 20 MG tablet Take 1 tablet (20 mg total) by mouth daily.   gabapentin (NEURONTIN) 300 MG capsule Take 300 mg by mouth 2 (two) times daily.   HYDROcodone-acetaminophen (NORCO/VICODIN) 5-325 MG tablet Take one tablet by mouth three times daily for pain   loperamide (IMODIUM A-D) 2 MG tablet Take 4 mg by mouth as needed for diarrhea or loose stools. If 3 loose stools in 24 hours, hold all laxatives and stool softeners. Give Imodium AD (loperamide) 4 mg po initial dose, then 2 mg po after each loose stool X 48 hrs.(maximum of 56m in 24 hrs. If diarrhea continues, notify MD   Multiple Vitamins-Minerals (ICAPS MV PO) Take 1 tablet by mouth daily.   Multiple Vitamins-Minerals (MULTIVITAMIN WITH MINERALS) tablet Take 1 tablet by mouth daily.    nystatin (MYCOSTATIN/NYSTOP) powder Apply to groin 2 times daily as needed   Sodium Fluoride 1.1 % PSTE Place onto teeth daily.   No facility-administered encounter medications on file as of 10/25/2020.    Review of Systems  Constitutional:  Negative for activity change, appetite change, fatigue and fever.  HENT:  Negative for dental problem, hearing loss and trouble swallowing.   Eyes:  Negative for visual disturbance.       Glasses  Respiratory:  Negative for cough, shortness of  breath and wheezing.   Cardiovascular:  Negative for chest pain and leg swelling.  Gastrointestinal:  Negative for abdominal distention, abdominal pain, constipation, diarrhea and nausea.  Genitourinary:  Negative for dysuria, frequency and hematuria.  Musculoskeletal:  Positive for arthralgias, back pain, gait problem and myalgias.  Skin:        Left foot callus   Neurological:  Positive for weakness and numbness. Negative for dizziness and headaches.  Psychiatric/Behavioral:  Negative for confusion and dysphoric mood. The patient is not nervous/anxious.    Immunization History  Administered Date(s) Administered   DTaP 05/13/2005   Influenza, High Dose Seasonal PF 02/23/2019, 02/15/2020   Influenza-Unspecified 02/11/2015, 02/22/2016, 02/19/2017, 02/16/2018   Moderna SARS-COV2 Booster Vaccination 10/18/2020   Moderna Sars-Covid-2 Vaccination 05/17/2019, 06/14/2019, 03/27/2020   PPD Test 12/08/2015, 12/22/2015   Pneumococcal Conjugate-13 05/13/2014   Pneumococcal Polysaccharide-23 05/13/2014, 03/17/2019   Td 12/11/2005   Tdap 01/04/2017   Zoster, Live 05/13/2014   Pertinent  Health Maintenance Due  Topic Date Due   FOOT EXAM  Never done   URINE MICROALBUMIN  07/07/2018   INFLUENZA VACCINE  12/11/2020   HEMOGLOBIN A1C  02/03/2021   OPHTHALMOLOGY EXAM  02/28/2021   DEXA SCAN  Completed   PNA vac Low Risk Adult  Completed   Fall Risk  01/05/2018 12/30/2016 11/03/2015  Falls in the past year? No No No   Functional Status Survey:    Vitals:   10/25/20 1154  BP: (!) 116/52  Pulse: 63  Resp: 18  Temp: (!) 97.5 F (36.4 C)  SpO2: 95%  Weight: 159 lb 12.8 oz (72.5 kg)  Height: _0  (1.6 m)   Body mass index is 28.31 kg/m. Physical Exam Vitals reviewed.  Constitutional:      General: She is not in acute distress. HENT:     Head: Normocephalic.     Right Ear: There is no impacted cerumen.     Left Ear: There is no impacted cerumen.     Nose: Nose normal.      Mouth/Throat:     Mouth: Mucous membranes are moist.  Eyes:     General:        Right eye: No discharge.        Left eye: No discharge.  Neck:     Thyroid: No thyroid mass or thyromegaly.  Cardiovascular:     Rate and Rhythm: Normal rate and regular rhythm.     Pulses:          Dorsalis pedis pulses are 1+ on the right side and 1+ on the left side.     Heart sounds: Normal heart sounds. No murmur heard. Pulmonary:     Effort: Pulmonary effort is normal. No respiratory distress.     Breath sounds: Normal breath sounds. No wheezing.  Abdominal:     General: Bowel sounds are normal. There is no distension.     Palpations: Abdomen is soft.     Tenderness: There is no abdominal tenderness.  Musculoskeletal:     Cervical back: Normal range of motion.     Right lower leg: No edema.     Left lower leg: No edema.  Feet:     Right foot:     Protective Sensation: 10 sites tested.  10 sites sensed.     Skin integrity: Callus present. No skin breakdown.     Toenail Condition: Right toenails are normal.     Left foot:     Protective Sensation: 10 sites tested.  10 sites sensed.     Skin integrity: No skin breakdown.     Toenail Condition: Left toenails are normal.  Lymphadenopathy:     Cervical: No cervical adenopathy.  Skin:    General: Skin is warm and dry.     Capillary Refill: Capillary refill takes less than 2 seconds.     Comments: Left heel with quarter sized callus, skin rough and uncracked, nontender. No sign of infection.   Neurological:     General: No focal deficit present.     Mental Status: She is alert and oriented to person, place, and time.     Motor: Weakness present.     Gait: Gait abnormal.     Comments: walker  Psychiatric:        Mood and Affect: Mood normal.        Behavior: Behavior normal.    Labs reviewed: Recent Labs    01/31/20 0000  NA 144  K 4.4  CL 107  CO2 29*  BUN 25*  CREATININE 1.2*  CALCIUM 9.5   Recent Labs    01/31/20 0000  AST  20  ALT 8  ALKPHOS 46  ALBUMIN 3.9   Recent Labs    01/31/20 0000  WBC 5.5  NEUTROABS 2,536.00  HGB 13.3  HCT 41  PLT 196   Lab Results  Component Value Date   TSH 2.17 01/31/2020   Lab Results  Component Value Date   HGBA1C 5.7 01/31/2020   Lab Results  Component Value Date   CHOL 211 (A) 06/16/2017   HDL 74 (A) 06/16/2017   LDLCALC 117 06/16/2017   TRIG 96 06/16/2017    Significant Diagnostic Results in last 30 days:  No results found.  Assessment/Plan 1. Osteoarthritis involving multiple joints on both sides of body - lower back and right hip most painful - pain controlled with tylenol, norco and gabapentin regimen  2. Right hip pain - see above - no recent falls or injuries - cont to ambulate with walker  3. Chronic radicular pain of lower back - see above  4. Unstable gait - see above  5.  Chronic diastolic congestive heart failure (HCC) - no weight fluctuations, sob or ankle edema - cont lasix 20 mg daily  6. Type 2 diabetes mellitus with stage 3a chronic kidney disease, without long-term current use of insulin (HCC) - A1c 5.7, no meds - continue carb modified diet  7. Stage 3a chronic kidney disease ([place]) -BUN/Creat 33/1.27 08/03/2020 - continue to avoid nephrotoxic drugs like NSAIDS and dose adjust medications to be renally excreted    Family/ staff Communication: plan discussed with patient and nurse  Labs/tests ordered:  none

## 2021-01-11 ENCOUNTER — Encounter: Payer: Self-pay | Admitting: Internal Medicine

## 2021-01-11 ENCOUNTER — Non-Acute Institutional Stay: Payer: Medicare Other | Admitting: Internal Medicine

## 2021-01-11 DIAGNOSIS — M159 Polyosteoarthritis, unspecified: Secondary | ICD-10-CM

## 2021-01-11 DIAGNOSIS — G609 Hereditary and idiopathic neuropathy, unspecified: Secondary | ICD-10-CM | POA: Diagnosis not present

## 2021-01-11 DIAGNOSIS — I5032 Chronic diastolic (congestive) heart failure: Secondary | ICD-10-CM | POA: Diagnosis not present

## 2021-01-11 DIAGNOSIS — E1122 Type 2 diabetes mellitus with diabetic chronic kidney disease: Secondary | ICD-10-CM | POA: Diagnosis not present

## 2021-01-11 DIAGNOSIS — N1831 Chronic kidney disease, stage 3a: Secondary | ICD-10-CM

## 2021-01-11 NOTE — Progress Notes (Signed)
Location:   [facility] Room Number: 23 Place of Service:  ALF [phone]) Provider:  Veleta Miners MD  [name], MD  Patient Care Team: [name], MD as PCP - General (Internal Medicine) Rutherford Guys, MD as Consulting Physician (Ophthalmology) [name], MD as Consulting Physician (Internal Medicine)  Extended Emergency Contact Information Primary Emergency Contact: Macnaughton,Leonard Address: [address] [place] [place] [address] Johnnette Litter of [place] Phone: [phone] Work Phone: [phone] Mobile Phone: [phone] Relation: Son Secondary Emergency Contact: Gerarda Fraction [place] of [place] Phone: [phone] Relation: Friend  Code Status:  DNR Managed Care Goals of care: Advanced Directive information Advanced Directives 01/11/2021  Does Patient Have a Medical Advance Directive? Yes  Type of Paramedic of [place];Living will;Out of facility DNR (pink MOST or yellow form)  Does patient want to make changes to medical advance directive? No - Patient declined  Copy of [place] in Chart? Yes - validated most recent copy scanned in chart (See row information)  Would patient like information on creating a medical advance directive? -  Pre-existing out of facility DNR order (yellow form or pink MOST form) Yellow form placed in chart (order not valid for inpatient use);Pink MOST form placed in chart (order not valid for inpatient use)     Chief Complaint  Patient presents with   Medical Management of Chronic Issues   Quality Metric Gaps    Foot exam, Shingrix, Urine microalbumin, Flu shot    HPI:  Pt is a 87 y.o. female seen today for medical management of chronic diseases.     Patient has h/o Hypertension, Diabetes Mellitus, H/o CKD,, Low back Pain and Arthritis  Doing well Lives in [place] with her walker Twin sons who live out of  state Independent in her ADLS and IADLS No Falls Weight stable   Past Medical History:  Diagnosis Date   Acute blood loss anemia    Allergy    Asthma    AVM (arteriovenous malformation) of colon with hemorrhage    Bursitis of right hip    Cataract    CKD (chronic kidney disease), stage III ([place]) 11/04/2015   COLONIC POLYPS 03/26/2005   Qualifier: Diagnosis of  By: Talbert Cage CMA (AAMA), June     DIVERTICULOSIS, COLON 03/26/2005   Qualifier: Diagnosis of  By: Talbert Cage CMA Deborra Medina), June     DVT (deep venous thrombosis), left 11/03/2015   Ecchymosis 12/16/2015   Hyperglycemia 12/16/2015   Hyperlipidemia    Hypertension    Multiple skin tears 12/16/2015   Osteopenia    Stasis edema of both lower extremities 11/04/2015   Unstable gait 12/16/2015   Urine incontinence 12/16/2015   Past Surgical History:  Procedure Laterality Date   ABDOMINAL HYSTERECTOMY  2006   Dr. Maryelizabeth Rowan   CATARACT EXTRACTION W/ INTRAOCULAR LENS IMPLANT Bilateral 1999/2003   Dr. Rutherford Guys   COLONOSCOPY N/A 11/06/2015   Procedure: COLONOSCOPY;  Surgeon: Mauri Pole, MD;  Location: [facility];  Service: Endoscopy;  Laterality: N/A;  MAC if available, otherwise moderate sedation    Allergies  Allergen Reactions   Epinephrine Nausea Only    Unknown.   Hydrocodone-Acetaminophen     REACTION: nausea   Labetalol    Procaine     Allergies as of 01/11/2021       Reactions   Epinephrine Nausea Only   Unknown.  Hydrocodone-acetaminophen    REACTION: nausea   Labetalol    Procaine         Medication List        Accurate as of January 11, 2021 10:38 AM. If you have any questions, ask your nurse or doctor.          acetaminophen 500 MG tablet Commonly known as: TYLENOL Take 500 mg by mouth 2 (two) times daily.   calcium carbonate 500 MG chewable tablet Commonly known as: TUMS - dosed in mg elemental calcium Chew 2 tablets by mouth every morning.   cholecalciferol 1000 units tablet Commonly  known as: VITAMIN D Take 1,000 Units by mouth daily.   furosemide 20 MG tablet Commonly known as: LASIX Take 1 tablet (20 mg total) by mouth daily.   gabapentin 300 MG capsule Commonly known as: NEURONTIN Take 300 mg by mouth 2 (two) times daily.   HYDROcodone-acetaminophen 5-325 MG tablet Commonly known as: NORCO/VICODIN Take one tablet by mouth three times daily for pain   loperamide 2 MG tablet Commonly known as: IMODIUM A-D Take 4 mg by mouth as needed for diarrhea or loose stools. If 3 loose stools in 24 hours, hold all laxatives and stool softeners. Give Imodium AD (loperamide) 4 mg po initial dose, then 2 mg po after each loose stool X 48 hrs.(maximum of 15m in 24 hrs. If diarrhea continues, notify MD   multivitamin with minerals tablet Take 1 tablet by mouth daily.   ICAPS MV PO Take 1 tablet by mouth daily.   nystatin powder Commonly known as: MYCOSTATIN/NYSTOP Apply to groin 2 times daily as needed   Sodium Fluoride 1.1 % Pste Place onto teeth daily.        Review of Systems Review of Systems  Constitutional: Negative for activity change, appetite change, chills, diaphoresis, fatigue and fever.  HENT: Negative for mouth sores, postnasal drip, rhinorrhea, sinus pain and sore throat.   Respiratory: Negative for apnea, cough, chest tightness, shortness of breath and wheezing.   Cardiovascular: Negative for chest pain, palpitations and leg swelling.  Gastrointestinal: Negative for abdominal distention, abdominal pain, constipation, diarrhea, nausea and vomiting.  Genitourinary: Negative for dysuria and frequency.  Musculoskeletal: Negative for arthralgias, joint swelling and myalgias.  Skin: Negative for rash.  Neurological: Negative for dizziness, syncope, weakness, light-headedness and numbness.  Psychiatric/Behavioral: Negative for behavioral problems, confusion and sleep disturbance.    Immunization History  Administered Date(s) Administered   DTaP  05/13/2005   Influenza, High Dose Seasonal PF 02/23/2019, 02/15/2020   Influenza-Unspecified 02/11/2015, 02/22/2016, 02/19/2017, 02/16/2018   Moderna SARS-COV2 Booster Vaccination 10/18/2020   Moderna Sars-Covid-2 Vaccination 05/17/2019, 06/14/2019, 03/27/2020   PPD Test 12/08/2015, 12/22/2015   Pneumococcal Conjugate-13 05/13/2014   Pneumococcal Polysaccharide-23 05/13/2014, 03/17/2019   Td 12/11/2005   Tdap 01/04/2017   Zoster, Live 05/13/2014   Pertinent  Health Maintenance Due  Topic Date Due   FOOT EXAM  Never done   URINE MICROALBUMIN  07/07/2018   INFLUENZA VACCINE  12/11/2020   HEMOGLOBIN A1C  02/03/2021   OPHTHALMOLOGY EXAM  02/28/2021   DEXA SCAN  Completed   PNA vac Low Risk Adult  Completed   Fall Risk  01/05/2018 12/30/2016 11/03/2015  Falls in the past year? No No No   Functional Status Survey:    Vitals:   01/11/21 1029  BP: (!) 150/71  Pulse: 65  Temp: (!) 97.1 F (36.2 C)  SpO2: 96%  Weight: 161 lb 3.2 oz (73.1 kg)  Height: 5'  3" (1.6 m)   Body mass index is 28.56 kg/m. Physical Exam Constitutional: Oriented to person, place, and time. Well-developed and well-nourished.  HENT:  Head: Normocephalic.  Mouth/Throat: Oropharynx is clear and moist.  Eyes: Pupils are equal, round, and reactive to light.  Neck: Neck supple.  Cardiovascular: Normal rate and normal heart sounds.  No murmur heard. Pulmonary/Chest: Effort normal and breath sounds normal. No respiratory distress. No wheezes. She has no rales.  Abdominal: Soft. Bowel sounds are normal. No distension. There is no tenderness. There is no rebound.  Musculoskeletal: No edema.  Lymphadenopathy: none Neurological: Alert and oriented to person, place, and time.  Skin: Skin is warm and dry.  Psychiatric: Normal mood and affect. Behavior is normal. Thought content normal.   Labs reviewed: Recent Labs    01/31/20 0000 08/03/20 0000  NA 144 142  K 4.4 4.0  CL 107 105  CO2 29* 24*  BUN 25* 33*   CREATININE 1.2* 1.3*  CALCIUM 9.5 8.9   Recent Labs    01/31/20 0000 08/03/20 0000  AST 20 19  ALT 8 8  ALKPHOS 46 36  ALBUMIN 3.9 3.7   Recent Labs    01/31/20 0000 08/03/20 0000  WBC 5.5 4.6  NEUTROABS 2,536.00  --   HGB 13.3 12.4  HCT 41 39  PLT 196  --    Lab Results  Component Value Date   TSH 2.17 01/31/2020   Lab Results  Component Value Date   HGBA1C 5.7 08/03/2020   Lab Results  Component Value Date   CHOL 211 (A) 06/16/2017   HDL 74 (A) 06/16/2017   LDLCALC 117 06/16/2017   TRIG 96 06/16/2017    Significant Diagnostic Results in last 30 days:  No results found.  Assessment/Plan Type 2 diabetes mellitus with stage 3a chronic kidney disease, without long-term current use of insulin (HCC) Not on Any Meds and A1C less then 6  Chronic diastolic congestive heart failure (HCC) On Low dose of Lasix Stage 3a chronic kidney disease (HCC) Creat stable Avoid NSAIDs Idiopathic peripheral neuropathy On High dose of Neurontin Osteoarthritis involving multiple joints on both sides of body Tylenol and Norco Doing well   Family/ staff Communication:   Labs/tests ordered:

## 2021-02-06 DIAGNOSIS — M25551 Pain in right hip: Secondary | ICD-10-CM | POA: Diagnosis not present

## 2021-02-06 DIAGNOSIS — M7071 Other bursitis of hip, right hip: Secondary | ICD-10-CM | POA: Diagnosis not present

## 2021-02-06 DIAGNOSIS — M6281 Muscle weakness (generalized): Secondary | ICD-10-CM | POA: Diagnosis not present

## 2021-02-09 DIAGNOSIS — M25551 Pain in right hip: Secondary | ICD-10-CM | POA: Diagnosis not present

## 2021-02-09 DIAGNOSIS — M6281 Muscle weakness (generalized): Secondary | ICD-10-CM | POA: Diagnosis not present

## 2021-02-09 DIAGNOSIS — M7071 Other bursitis of hip, right hip: Secondary | ICD-10-CM | POA: Diagnosis not present

## 2021-02-13 DIAGNOSIS — R2681 Unsteadiness on feet: Secondary | ICD-10-CM | POA: Diagnosis not present

## 2021-02-13 DIAGNOSIS — M7071 Other bursitis of hip, right hip: Secondary | ICD-10-CM | POA: Diagnosis not present

## 2021-02-13 DIAGNOSIS — R2689 Other abnormalities of gait and mobility: Secondary | ICD-10-CM | POA: Diagnosis not present

## 2021-02-13 DIAGNOSIS — M25551 Pain in right hip: Secondary | ICD-10-CM | POA: Diagnosis not present

## 2021-02-13 DIAGNOSIS — M6281 Muscle weakness (generalized): Secondary | ICD-10-CM | POA: Diagnosis not present

## 2021-02-20 DIAGNOSIS — M25551 Pain in right hip: Secondary | ICD-10-CM | POA: Diagnosis not present

## 2021-02-20 DIAGNOSIS — M7071 Other bursitis of hip, right hip: Secondary | ICD-10-CM | POA: Diagnosis not present

## 2021-02-20 DIAGNOSIS — R2681 Unsteadiness on feet: Secondary | ICD-10-CM | POA: Diagnosis not present

## 2021-02-20 DIAGNOSIS — M6281 Muscle weakness (generalized): Secondary | ICD-10-CM | POA: Diagnosis not present

## 2021-02-20 DIAGNOSIS — R2689 Other abnormalities of gait and mobility: Secondary | ICD-10-CM | POA: Diagnosis not present

## 2021-02-22 DIAGNOSIS — M6281 Muscle weakness (generalized): Secondary | ICD-10-CM | POA: Diagnosis not present

## 2021-02-22 DIAGNOSIS — R2689 Other abnormalities of gait and mobility: Secondary | ICD-10-CM | POA: Diagnosis not present

## 2021-02-22 DIAGNOSIS — R2681 Unsteadiness on feet: Secondary | ICD-10-CM | POA: Diagnosis not present

## 2021-02-22 DIAGNOSIS — M25551 Pain in right hip: Secondary | ICD-10-CM | POA: Diagnosis not present

## 2021-02-22 DIAGNOSIS — M7071 Other bursitis of hip, right hip: Secondary | ICD-10-CM | POA: Diagnosis not present

## 2021-02-23 DIAGNOSIS — L602 Onychogryphosis: Secondary | ICD-10-CM | POA: Diagnosis not present

## 2021-02-23 DIAGNOSIS — M79671 Pain in right foot: Secondary | ICD-10-CM | POA: Diagnosis not present

## 2021-02-23 DIAGNOSIS — M79672 Pain in left foot: Secondary | ICD-10-CM | POA: Diagnosis not present

## 2021-02-27 DIAGNOSIS — M6281 Muscle weakness (generalized): Secondary | ICD-10-CM | POA: Diagnosis not present

## 2021-02-27 DIAGNOSIS — R2689 Other abnormalities of gait and mobility: Secondary | ICD-10-CM | POA: Diagnosis not present

## 2021-02-27 DIAGNOSIS — M25551 Pain in right hip: Secondary | ICD-10-CM | POA: Diagnosis not present

## 2021-02-27 DIAGNOSIS — M7071 Other bursitis of hip, right hip: Secondary | ICD-10-CM | POA: Diagnosis not present

## 2021-02-27 DIAGNOSIS — R2681 Unsteadiness on feet: Secondary | ICD-10-CM | POA: Diagnosis not present

## 2021-02-28 DIAGNOSIS — H524 Presbyopia: Secondary | ICD-10-CM | POA: Diagnosis not present

## 2021-02-28 DIAGNOSIS — E119 Type 2 diabetes mellitus without complications: Secondary | ICD-10-CM | POA: Diagnosis not present

## 2021-02-28 DIAGNOSIS — H52203 Unspecified astigmatism, bilateral: Secondary | ICD-10-CM | POA: Diagnosis not present

## 2021-02-28 DIAGNOSIS — Z961 Presence of intraocular lens: Secondary | ICD-10-CM | POA: Diagnosis not present

## 2021-02-28 DIAGNOSIS — H353131 Nonexudative age-related macular degeneration, bilateral, early dry stage: Secondary | ICD-10-CM | POA: Diagnosis not present

## 2021-02-28 DIAGNOSIS — H5213 Myopia, bilateral: Secondary | ICD-10-CM | POA: Diagnosis not present

## 2021-03-01 DIAGNOSIS — M7071 Other bursitis of hip, right hip: Secondary | ICD-10-CM | POA: Diagnosis not present

## 2021-03-01 DIAGNOSIS — R2681 Unsteadiness on feet: Secondary | ICD-10-CM | POA: Diagnosis not present

## 2021-03-01 DIAGNOSIS — M6281 Muscle weakness (generalized): Secondary | ICD-10-CM | POA: Diagnosis not present

## 2021-03-01 DIAGNOSIS — M25551 Pain in right hip: Secondary | ICD-10-CM | POA: Diagnosis not present

## 2021-03-01 DIAGNOSIS — R2689 Other abnormalities of gait and mobility: Secondary | ICD-10-CM | POA: Diagnosis not present

## 2021-03-06 DIAGNOSIS — M25551 Pain in right hip: Secondary | ICD-10-CM | POA: Diagnosis not present

## 2021-03-06 DIAGNOSIS — R2689 Other abnormalities of gait and mobility: Secondary | ICD-10-CM | POA: Diagnosis not present

## 2021-03-06 DIAGNOSIS — M7071 Other bursitis of hip, right hip: Secondary | ICD-10-CM | POA: Diagnosis not present

## 2021-03-06 DIAGNOSIS — R2681 Unsteadiness on feet: Secondary | ICD-10-CM | POA: Diagnosis not present

## 2021-03-06 DIAGNOSIS — M6281 Muscle weakness (generalized): Secondary | ICD-10-CM | POA: Diagnosis not present

## 2021-03-08 DIAGNOSIS — R2681 Unsteadiness on feet: Secondary | ICD-10-CM | POA: Diagnosis not present

## 2021-03-08 DIAGNOSIS — M7071 Other bursitis of hip, right hip: Secondary | ICD-10-CM | POA: Diagnosis not present

## 2021-03-08 DIAGNOSIS — M25551 Pain in right hip: Secondary | ICD-10-CM | POA: Diagnosis not present

## 2021-03-08 DIAGNOSIS — R2689 Other abnormalities of gait and mobility: Secondary | ICD-10-CM | POA: Diagnosis not present

## 2021-03-08 DIAGNOSIS — M6281 Muscle weakness (generalized): Secondary | ICD-10-CM | POA: Diagnosis not present

## 2021-03-20 DIAGNOSIS — R2681 Unsteadiness on feet: Secondary | ICD-10-CM | POA: Diagnosis not present

## 2021-03-20 DIAGNOSIS — R2689 Other abnormalities of gait and mobility: Secondary | ICD-10-CM | POA: Diagnosis not present

## 2021-03-20 DIAGNOSIS — M6281 Muscle weakness (generalized): Secondary | ICD-10-CM | POA: Diagnosis not present

## 2021-03-20 DIAGNOSIS — M25551 Pain in right hip: Secondary | ICD-10-CM | POA: Diagnosis not present

## 2021-03-22 DIAGNOSIS — M6281 Muscle weakness (generalized): Secondary | ICD-10-CM | POA: Diagnosis not present

## 2021-03-22 DIAGNOSIS — R2681 Unsteadiness on feet: Secondary | ICD-10-CM | POA: Diagnosis not present

## 2021-03-22 DIAGNOSIS — M25551 Pain in right hip: Secondary | ICD-10-CM | POA: Diagnosis not present

## 2021-03-22 DIAGNOSIS — R2689 Other abnormalities of gait and mobility: Secondary | ICD-10-CM | POA: Diagnosis not present

## 2021-03-27 DIAGNOSIS — R2689 Other abnormalities of gait and mobility: Secondary | ICD-10-CM | POA: Diagnosis not present

## 2021-03-27 DIAGNOSIS — R2681 Unsteadiness on feet: Secondary | ICD-10-CM | POA: Diagnosis not present

## 2021-03-27 DIAGNOSIS — M6281 Muscle weakness (generalized): Secondary | ICD-10-CM | POA: Diagnosis not present

## 2021-03-27 DIAGNOSIS — M25551 Pain in right hip: Secondary | ICD-10-CM | POA: Diagnosis not present

## 2021-03-29 DIAGNOSIS — M6281 Muscle weakness (generalized): Secondary | ICD-10-CM | POA: Diagnosis not present

## 2021-03-29 DIAGNOSIS — R2689 Other abnormalities of gait and mobility: Secondary | ICD-10-CM | POA: Diagnosis not present

## 2021-03-29 DIAGNOSIS — R2681 Unsteadiness on feet: Secondary | ICD-10-CM | POA: Diagnosis not present

## 2021-03-29 DIAGNOSIS — M25551 Pain in right hip: Secondary | ICD-10-CM | POA: Diagnosis not present

## 2021-04-27 ENCOUNTER — Encounter: Payer: Self-pay | Admitting: Orthopedic Surgery

## 2021-04-27 ENCOUNTER — Non-Acute Institutional Stay: Payer: Medicare Other | Admitting: Orthopedic Surgery

## 2021-04-27 DIAGNOSIS — D692 Other nonthrombocytopenic purpura: Secondary | ICD-10-CM

## 2021-04-27 DIAGNOSIS — N1831 Chronic kidney disease, stage 3a: Secondary | ICD-10-CM

## 2021-04-27 DIAGNOSIS — G609 Hereditary and idiopathic neuropathy, unspecified: Secondary | ICD-10-CM | POA: Diagnosis not present

## 2021-04-27 DIAGNOSIS — E1122 Type 2 diabetes mellitus with diabetic chronic kidney disease: Secondary | ICD-10-CM

## 2021-04-27 DIAGNOSIS — M85852 Other specified disorders of bone density and structure, left thigh: Secondary | ICD-10-CM

## 2021-04-27 DIAGNOSIS — M159 Polyosteoarthritis, unspecified: Secondary | ICD-10-CM

## 2021-04-27 DIAGNOSIS — R2681 Unsteadiness on feet: Secondary | ICD-10-CM

## 2021-04-27 DIAGNOSIS — I5032 Chronic diastolic (congestive) heart failure: Secondary | ICD-10-CM

## 2021-04-27 NOTE — Progress Notes (Signed)
Location:   [facility] Room Number: 23 Place of Service:  ALF [phone]) Provider:  Windell Moulding, NP  [name], MD  Patient Care Team: [name], MD as PCP - General (Internal Medicine) Rutherford Guys, MD as Consulting Physician (Ophthalmology) [name], MD as Consulting Physician (Internal Medicine)  Extended Emergency Contact Information Primary Emergency Contact: Rosenbach,Leonard Address: [address] [place] [address] Johnnette Litter of [place] Phone: [phone] Work Phone: [phone] Mobile Phone: [phone] Relation: Son Secondary Emergency Contact: Gerarda Fraction States of [place] Phone: [phone] Relation: Friend  Code Status:  DNR Goals of care: Advanced Directive information Advanced Directives 04/27/2021  Does Patient Have a Medical Advance Directive? Yes  Type of Paramedic of [place];Living will;Out of facility DNR (pink MOST or yellow form)  Does patient want to make changes to medical advance directive? No - Patient declined  Copy of [place] in Chart? Yes - validated most recent copy scanned in chart (See row information)  Would patient like information on creating a medical advance directive? -  Pre-existing out of facility DNR order (yellow form or pink MOST form) Pink MOST form placed in chart (order not valid for inpatient use);Yellow form placed in chart (order not valid for inpatient use)     Chief Complaint  Patient presents with   Medical Management of Chronic Issues    Routine follow up visit.   Health Maintenance    Foot exam, zoster vaccine, urine microalbumin, 3rd COVID booster, Hemoglobin A1c, eye exam    HPI:  Pt is a 87 y.o. female seen today for medical management of chronic diseases.    She currently resides on the assisted living unit at [facility]. PMH: CHF, PVD,  hx of DVT, diverticulosis, T2DM, OA  spine, peripheral neuropathy, and CKD III.   T2DM- A1c 5.7 08/03/2020, not on medications, remains on carb modified diet CHF- no recent weight fluctuations/sob/ankle edema, Echo LVEF 55-60% 2017, remains on furosemide daily CKD 3a- BUN/creat 33/1.3 08/03/2020 Neuropathy- denies increased pain/numbness, remains on gabapentin OA- involves lower back and hips, denies increased pain, remains on norco and tylenol daily Unstable gait- ambulates well with walker, ambulates to [employer] and back,admits to feeling tired after a long walk, no recent falls Osteopenia- DEXA 2015, tscore -2.3, remains on calcium and vitamin D  Attends social gatherings often. Likes to read and do puzzles during the day. Sons live out of state and call often.   Recent blood pressures:  12/14- 143/74  12/07- 118/54  11/30- 121/60  Recent weights:  12/02- 161.2 lbs  11/03- 160.2 lbs  10/10- 161.4 lbs     Past Medical History:  Diagnosis Date   Acute blood loss anemia    Allergy    Asthma    AVM (arteriovenous malformation) of colon with hemorrhage    Bursitis of right hip    Cataract    CKD (chronic kidney disease), stage III ([place]) 11/04/2015   COLONIC POLYPS 03/26/2005   Qualifier: Diagnosis of  By: Talbert Cage CMA (AAMA), June     DIVERTICULOSIS, COLON 03/26/2005   Qualifier: Diagnosis of  By: Talbert Cage CMA Deborra Medina), June     DVT (deep venous thrombosis), left 11/03/2015   Ecchymosis 12/16/2015   Hyperglycemia 12/16/2015   Hyperlipidemia    Hypertension    Multiple skin tears 12/16/2015   Osteopenia    Stasis edema of  both lower extremities 11/04/2015   Unstable gait 12/16/2015   Urine incontinence 12/16/2015   Past Surgical History:  Procedure Laterality Date   ABDOMINAL HYSTERECTOMY  2006   Dr. Maryelizabeth Rowan   CATARACT EXTRACTION W/ INTRAOCULAR LENS IMPLANT Bilateral 1999/2003   Dr. Rutherford Guys   COLONOSCOPY N/A 11/06/2015   Procedure: COLONOSCOPY;  Surgeon: Mauri Pole, MD;  Location: [facility];   Service: Endoscopy;  Laterality: N/A;  MAC if available, otherwise moderate sedation    Allergies  Allergen Reactions   Epinephrine Nausea Only    Unknown.   Hydrocodone-Acetaminophen     REACTION: nausea   Labetalol    Procaine     Allergies as of 04/27/2021       Reactions   Epinephrine Nausea Only   Unknown.   Hydrocodone-acetaminophen    REACTION: nausea   Labetalol    Procaine         Medication List        Accurate as of April 27, 2021 10:35 AM. If you have any questions, ask your nurse or doctor.          acetaminophen 500 MG tablet Commonly known as: TYLENOL Take 500 mg by mouth 2 (two) times daily.   calcium carbonate 500 MG chewable tablet Commonly known as: TUMS - dosed in mg elemental calcium Chew 2 tablets by mouth every morning.   cholecalciferol 1000 units tablet Commonly known as: VITAMIN D Take 1,000 Units by mouth daily.   furosemide 20 MG tablet Commonly known as: LASIX Take 1 tablet (20 mg total) by mouth daily.   gabapentin 300 MG capsule Commonly known as: NEURONTIN Take 300 mg by mouth 2 (two) times daily.   HYDROcodone-acetaminophen 5-325 MG tablet Commonly known as: NORCO/VICODIN Take one tablet by mouth three times daily for pain   loperamide 2 MG tablet Commonly known as: IMODIUM A-D Take 4 mg by mouth as needed for diarrhea or loose stools. If 3 loose stools in 24 hours, hold all laxatives and stool softeners. Give Imodium AD (loperamide) 4 mg po initial dose, then 2 mg po after each loose stool X 48 hrs.(maximum of 56m in 24 hrs. If diarrhea continues, notify MD   multivitamin with minerals tablet Take 1 tablet by mouth daily.   ICAPS MV PO Take 1 tablet by mouth daily.   nystatin powder Commonly known as: MYCOSTATIN/NYSTOP Apply to groin 2 times daily as needed   Sodium Fluoride 1.1 % Pste Place onto teeth daily.        Review of Systems  Constitutional:  Negative for activity change, appetite change  and fatigue.  HENT:  Negative for hearing loss and trouble swallowing.   Eyes:  Negative for visual disturbance.  Respiratory:  Negative for cough, shortness of breath and wheezing.   Cardiovascular:  Negative for chest pain and leg swelling.  Gastrointestinal:  Negative for abdominal distention, abdominal pain, blood in stool, constipation, diarrhea, nausea and vomiting.  Genitourinary:  Negative for dysuria, frequency and hematuria.  Musculoskeletal:  Positive for arthralgias, back pain and gait problem.  Skin:  Negative for rash and wound.  Neurological:  Positive for weakness. Negative for dizziness and headaches.  Psychiatric/Behavioral:  Negative for confusion, dysphoric mood and sleep disturbance. The patient is not nervous/anxious.    Immunization History  Administered Date(s) Administered   DTaP 05/13/2005   Influenza, High Dose Seasonal PF 02/23/2019, 02/15/2020   Influenza-Unspecified 02/11/2015, 02/22/2016, 02/19/2017, 02/16/2018, 03/06/2021   Moderna SARS-COV2 Booster Vaccination 10/18/2020  Moderna Sars-Covid-2 Vaccination 05/17/2019, 06/14/2019, 03/27/2020   PFIZER(Purple Top)SARS-COV-2 Vaccination 01/31/2021   PPD Test 12/08/2015, 12/22/2015   Pneumococcal Conjugate-13 05/13/2014   Pneumococcal Polysaccharide-23 05/13/2014, 03/17/2019   Td 12/11/2005   Tdap 01/04/2017   Zoster, Live 05/13/2014   Pertinent  Health Maintenance Due  Topic Date Due   FOOT EXAM  Never done   URINE MICROALBUMIN  07/07/2018   HEMOGLOBIN A1C  02/03/2021   OPHTHALMOLOGY EXAM  02/28/2021   INFLUENZA VACCINE  Completed   DEXA SCAN  Completed   Fall Risk 11/03/2015 12/30/2016 01/05/2018  Falls in the past year? No No No   Functional Status Survey:    Vitals:   04/27/21 1022  BP: (!) 143/74  Pulse: 65  Resp: 20  Temp: (!) 97.5 F (36.4 C)  SpO2: 93%  Weight: 161 lb 3.2 oz (73.1 kg)  Height: _0  (1.6 m)   Body mass index is 28.56 kg/m. Physical Exam Vitals reviewed.   Constitutional:      General: She is not in acute distress. HENT:     Head: Normocephalic.     Right Ear: There is no impacted cerumen.     Left Ear: There is no impacted cerumen.     Nose: Nose normal.     Mouth/Throat:     Mouth: Mucous membranes are moist.  Eyes:     General:        Right eye: No discharge.        Left eye: No discharge.  Neck:     Vascular: No carotid bruit.  Cardiovascular:     Rate and Rhythm: Normal rate and regular rhythm.     Pulses:          Dorsalis pedis pulses are 1+ on the right side and 1+ on the left side.     Heart sounds: Normal heart sounds. No murmur heard. Pulmonary:     Effort: Pulmonary effort is normal. No respiratory distress.     Breath sounds: Normal breath sounds. No wheezing.  Abdominal:     General: Bowel sounds are normal. There is no distension.     Palpations: Abdomen is soft.     Tenderness: There is no abdominal tenderness.  Musculoskeletal:     Cervical back: Normal range of motion.     Right lower leg: No edema.     Left lower leg: No edema.  Feet:     Right foot:     Protective Sensation: 8 sites tested.  10 sites sensed.     Skin integrity: Skin integrity normal.     Toenail Condition: Right toenails are abnormally thick. Fungal disease present.    Left foot:     Protective Sensation: 8 sites tested.  10 sites sensed.     Skin integrity: Skin integrity normal.     Toenail Condition: Left toenails are abnormally thick. Fungal disease present. Lymphadenopathy:     Cervical: No cervical adenopathy.  Skin:    General: Skin is warm and dry.     Capillary Refill: Capillary refill takes less than 2 seconds.     Comments: Multiple non tender purple/red areas noted to upper arms, sizes varies, no skin breakdown or swelling.   Neurological:     General: No focal deficit present.     Mental Status: She is alert and oriented to person, place, and time.    Labs reviewed: Recent Labs    08/03/20 0000  NA 142  K 4.0   CL 105  CO2 24*  BUN 33*  CREATININE 1.3*  CALCIUM 8.9   Recent Labs    08/03/20 0000  AST 19  ALT 8  ALKPHOS 36  ALBUMIN 3.7   Recent Labs    08/03/20 0000  WBC 4.6  HGB 12.4  HCT 39   Lab Results  Component Value Date   TSH 2.17 01/31/2020   Lab Results  Component Value Date   HGBA1C 5.7 08/03/2020   Lab Results  Component Value Date   CHOL 211 (A) 06/16/2017   HDL 74 (A) 06/16/2017   LDLCALC 117 06/16/2017   TRIG 96 06/16/2017    Significant Diagnostic Results in last 30 days:  No results found.  Assessment/Plan 1. Type 2 diabetes mellitus with stage 3a chronic kidney disease, without long-term current use of insulin (HCC) - A1c stable - not on medication - cont carb modified diet - A1c  2. Chronic diastolic congestive heart failure (HCC) - no recent weight fluctuations/sob/ ankle edema - cont furosemide  3. Stage 3a chronic kidney disease ([place]) - cont to avoid nephrotoxic drugs like NSAIDS and dose adjust medications to be renally excreted - cmp  4. Idiopathic peripheral neuropathy - no increased pain - cont gabapentin  5. Osteoarthritis involving multiple joints on both sides of body - cont scheduled tylneol and norco  6. Unstable gait - ambulates well with walker  7. Senile purpura ([place]) - education given  8. Osteopenia of neck of left femur - cont calcium and vitamin D    Family/ staff Communication: plan discussed with patient and nurse  Labs/tests ordered:  cbc/diff, cmp, A1c, TSH

## 2021-05-03 ENCOUNTER — Other Ambulatory Visit: Payer: Self-pay | Admitting: *Deleted

## 2021-05-03 DIAGNOSIS — G8929 Other chronic pain: Secondary | ICD-10-CM

## 2021-05-03 DIAGNOSIS — M25551 Pain in right hip: Secondary | ICD-10-CM

## 2021-05-03 LAB — COMPREHENSIVE METABOLIC PANEL
Albumin: 4.2 (ref 3.5–5.0)
Calcium: 9.4 (ref 8.7–10.7)
Globulin: 2.5

## 2021-05-03 LAB — BASIC METABOLIC PANEL WITH GFR
BUN: 29 — AB (ref 4–21)
CO2: 30 — AB (ref 13–22)
Chloride: 102 (ref 99–108)
Creatinine: 1.2 — AB (ref 0.5–1.1)
Glucose: 79
Potassium: 4.2 (ref 3.4–5.3)
Sodium: 141 (ref 137–147)

## 2021-05-03 LAB — HEPATIC FUNCTION PANEL
ALT: 11 (ref 7–35)
AST: 21 (ref 13–35)
Alkaline Phosphatase: 47 (ref 25–125)
Bilirubin, Total: 0.6

## 2021-05-03 LAB — HEMOGLOBIN A1C: Hemoglobin A1C: 5.7

## 2021-05-03 LAB — CBC AND DIFFERENTIAL
HCT: 41 (ref 36–46)
Hemoglobin: 13.2 (ref 12.0–16.0)
Neutrophils Absolute: 2682
Platelets: 205 (ref 150–399)
WBC: 5.6

## 2021-05-03 LAB — CBC: RBC: 4.66 (ref 3.87–5.11)

## 2021-05-03 LAB — TSH: TSH: 2.25 (ref 0.41–5.90)

## 2021-05-03 MED ORDER — HYDROCODONE-ACETAMINOPHEN 5-325 MG PO TABS: ORAL_TABLET | ORAL | 0 refills | Status: DC

## 2021-05-03 NOTE — Telephone Encounter (Signed)
Pharmacy requested refill.  Epic LR: 10/04/2020 Pended Rx and sent to Amy for approval.

## 2021-05-04 LAB — BASIC METABOLIC PANEL
Creatinine: 1.2 — AB (ref <=1.1)
Glucose: 79

## 2021-05-04 LAB — COMPREHENSIVE METABOLIC PANEL: Calcium: 9.4 (ref 8.7–10.7)

## 2021-05-08 ENCOUNTER — Encounter: Payer: Self-pay | Admitting: Nurse Practitioner

## 2021-05-08 ENCOUNTER — Non-Acute Institutional Stay: Payer: Medicare Other | Admitting: Nurse Practitioner

## 2021-05-08 DIAGNOSIS — I5032 Chronic diastolic (congestive) heart failure: Secondary | ICD-10-CM

## 2021-05-08 DIAGNOSIS — K642 Third degree hemorrhoids: Secondary | ICD-10-CM | POA: Diagnosis not present

## 2021-05-08 DIAGNOSIS — E1122 Type 2 diabetes mellitus with diabetic chronic kidney disease: Secondary | ICD-10-CM

## 2021-05-08 DIAGNOSIS — K649 Unspecified hemorrhoids: Secondary | ICD-10-CM | POA: Insufficient documentation

## 2021-05-08 DIAGNOSIS — M4726 Other spondylosis with radiculopathy, lumbar region: Secondary | ICD-10-CM | POA: Diagnosis not present

## 2021-05-08 DIAGNOSIS — M85852 Other specified disorders of bone density and structure, left thigh: Secondary | ICD-10-CM

## 2021-05-08 NOTE — Assessment & Plan Note (Signed)
hemorrhoidal bleed this morning. The patient denied rectal pain, admitted stone was hard, but ale to eliminated. Denied nausea, vomiting, or abd pain. Avoid constipation, try Colace for now, have Hydrocortisone 2% cream prn available to the patient. Observe.

## 2021-05-08 NOTE — Assessment & Plan Note (Signed)
OA, pain in the right hip, worsened in morning, on Gabapentin, Norco

## 2021-05-08 NOTE — Assessment & Plan Note (Signed)
CHF/edema, compensated, on Furosemide

## 2021-05-08 NOTE — Assessment & Plan Note (Signed)
Diet controlled T2DM

## 2021-05-08 NOTE — Assessment & Plan Note (Signed)
takes Vit D, Ca

## 2021-05-08 NOTE — Progress Notes (Signed)
Location:   [facility] Room Number: 23 Place of Service:  [facility] (13) Provider: Lennie Odor Dannae Kato NP  [name], MD  Patient Care Team: [name], MD as PCP - General (Internal Medicine) Rutherford Guys, MD as Consulting Physician (Ophthalmology) [name], MD as Consulting Physician (Internal Medicine)  Extended Emergency Contact Information Primary Emergency Contact: Swaim,Leonard Address: [address] [place] [address] Johnnette Litter of [place] Phone: [phone] Work Phone: [phone] Mobile Phone: [phone] Relation: Son Secondary Emergency Contact: Gerarda Fraction [place] of [place] Phone: [phone] Relation: Friend  Code Status:  DNR Goals of care: Advanced Directive information Advanced Directives 05/08/2021  Does Patient Have a Medical Advance Directive? Yes  Type of Paramedic of [place];Living will;Out of facility DNR (pink MOST or yellow form)  Does patient want to make changes to medical advance directive? No - Patient declined  Copy of [place] in Chart? Yes - validated most recent copy scanned in chart (See row information)  Would patient like information on creating a medical advance directive? -  Pre-existing out of facility DNR order (yellow form or pink MOST form) Pink MOST form placed in chart (order not valid for inpatient use);Yellow form placed in chart (order not valid for inpatient use)     Chief Complaint  Patient presents with   Acute Visit    Hemorrhoids bleeding    HPI:  Pt is a 87 y.o. female seen today for an acute visit for reported hemorrhoidal bleed this morning. The patient denied rectal pain, admitted stone was hard, but ale to eliminated. Denied nausea, vomiting, or abd pain.    The patient resides in   [facility], ambulates with walker.              OA, pain in the right hip, worsened in morning, on Gabapentin, Norco                 CHF/edema, compensated, on Furosemide             Diet controlled T2DM             Unstable gait, ambulates with walker, slow.   CKD, stable  Osteopenia, takes Vit D, Ca          Past Medical History:  Diagnosis Date   Acute blood loss anemia    Allergy    Asthma    AVM (arteriovenous malformation) of colon with hemorrhage    Bursitis of right hip    Cataract    CKD (chronic kidney disease), stage III ([place]) 11/04/2015   COLONIC POLYPS 03/26/2005   Qualifier: Diagnosis of  By: Talbert Cage CMA (AAMA), June     DIVERTICULOSIS, COLON 03/26/2005   Qualifier: Diagnosis of  By: Talbert Cage CMA Deborra Medina), June     DVT (deep venous thrombosis), left 11/03/2015   Ecchymosis 12/16/2015   Hyperglycemia 12/16/2015   Hyperlipidemia    Hypertension    Multiple skin tears 12/16/2015   Osteopenia    Stasis edema of both lower extremities 11/04/2015   Unstable gait 12/16/2015   Urine incontinence 12/16/2015   Past Surgical History:  Procedure Laterality Date   ABDOMINAL HYSTERECTOMY  2006   Dr. Maryelizabeth Rowan   CATARACT EXTRACTION W/ INTRAOCULAR LENS IMPLANT Bilateral 1999/2003   Dr. Rutherford Guys   COLONOSCOPY N/A 11/06/2015   Procedure: COLONOSCOPY;  Surgeon: Mauri Pole, MD;  Location: [facility];  Service: Endoscopy;  Laterality: N/A;  MAC if available, otherwise moderate sedation    Allergies  Allergen Reactions   Epinephrine Nausea Only    Unknown.   Hydrocodone-Acetaminophen     REACTION: nausea   Labetalol    Procaine     Allergies as of 05/08/2021       Reactions   Epinephrine Nausea Only   Unknown.   Hydrocodone-acetaminophen    REACTION: nausea   Labetalol    Procaine         Medication List        Accurate as of May 08, 2021 11:59 PM. If you have any questions, ask your nurse or doctor.          acetaminophen 500 MG tablet Commonly known as: TYLENOL Take 500 mg by mouth 2 (two) times daily.   calcium carbonate 500 MG chewable tablet Commonly known as:  TUMS - dosed in mg elemental calcium Chew 2 tablets by mouth every morning.   cholecalciferol 1000 units tablet Commonly known as: VITAMIN D Take 1,000 Units by mouth daily.   furosemide 20 MG tablet Commonly known as: LASIX Take 1 tablet (20 mg total) by mouth daily.   gabapentin 300 MG capsule Commonly known as: NEURONTIN Take 300 mg by mouth 2 (two) times daily.   HYDROcodone-acetaminophen 5-325 MG tablet Commonly known as: NORCO/VICODIN Take one tablet by mouth three times daily for pain   loperamide 2 MG tablet Commonly known as: IMODIUM A-D Take 4 mg by mouth as needed for diarrhea or loose stools. If 3 loose stools in 24 hours, hold all laxatives and stool softeners. Give Imodium AD (loperamide) 4 mg po initial dose, then 2 mg po after each loose stool X 48 hrs.(maximum of 51m in 24 hrs. If diarrhea continues, notify MD   multivitamin with minerals tablet Take 1 tablet by mouth daily.   ICAPS MV PO Take 1 tablet by mouth daily.   nystatin powder Commonly known as: MYCOSTATIN/NYSTOP Apply to groin 2 times daily as needed   Sodium Fluoride 1.1 % Pste Place onto teeth daily.        Review of Systems  Constitutional:  Negative for activity change, appetite change and fever.  HENT:  Positive for hearing loss. Negative for congestion and voice change.   Eyes:  Negative for visual disturbance.  Respiratory:  Negative for cough and shortness of breath.   Cardiovascular:  Positive for leg swelling.  Gastrointestinal:  Positive for anal bleeding. Negative for abdominal pain, constipation, nausea, rectal pain and vomiting.  Genitourinary:  Negative for dysuria, frequency and urgency.  Musculoskeletal:  Positive for arthralgias, back pain and gait problem.       Lower back, right hip  Skin:        Reddened L heel.   Neurological:  Negative for speech difficulty, weakness and headaches.       Memory lapses.   Psychiatric/Behavioral:  Negative for behavioral problems  and sleep disturbance. The patient is not nervous/anxious.    Immunization History  Administered Date(s) Administered   DTaP 05/13/2005   Influenza, High Dose Seasonal PF 02/23/2019, 02/15/2020   Influenza-Unspecified 02/11/2015, 02/22/2016, 02/19/2017, 02/16/2018, 03/06/2021   Moderna SARS-COV2 Booster Vaccination 10/18/2020   Moderna Sars-Covid-2 Vaccination 05/17/2019, 06/14/2019, 03/27/2020   PFIZER(Purple Top)SARS-COV-2 Vaccination 01/31/2021   PPD Test 12/08/2015, 12/22/2015   Pneumococcal Conjugate-13 05/13/2014   Pneumococcal Polysaccharide-23 05/13/2014, 03/17/2019   Td 12/11/2005   Tdap 01/04/2017   Zoster, Live 05/13/2014  Pertinent  Health Maintenance Due  Topic Date Due   HEMOGLOBIN A1C  02/03/2021   URINE MICROALBUMIN  05/30/2021 (Originally 07/07/2018)   OPHTHALMOLOGY EXAM  02/28/2022   FOOT EXAM  04/26/2022   INFLUENZA VACCINE  Completed   DEXA SCAN  Completed   Fall Risk 11/03/2015 12/30/2016 01/05/2018  Falls in the past year? No No No   Functional Status Survey:    Vitals:   05/08/21 1507  BP: 133/62  Pulse: 68  Resp: 20  Temp: 98 F (36.7 C)  SpO2: 93%  Weight: 161 lb 3.2 oz (73.1 kg)  Height: _0  (1.6 m)   Body mass index is 28.56 kg/m. Physical Exam Vitals and nursing note reviewed.  Constitutional:      Appearance: Normal appearance.     Comments: Over weight  HENT:     Head: Normocephalic and atraumatic.     Mouth/Throat:     Mouth: Mucous membranes are moist.  Eyes:     Extraocular Movements: Extraocular movements intact.     Pupils: Pupils are equal, round, and reactive to light.  Cardiovascular:     Rate and Rhythm: Normal rate and regular rhythm.     Heart sounds: No murmur heard. Pulmonary:     Breath sounds: No rales.     Comments: Bibasilar rales.  Abdominal:     Palpations: Abdomen is soft.     Tenderness: There is no abdominal tenderness.  Genitourinary:    Comments: External hemorrhoids, degree 3, no apparent injury.  No blood on my rectal digital exam finger, no internal hemorrhoids or hard stool noted  Musculoskeletal:     Cervical back: Normal range of motion and neck supple.     Right lower leg: Edema present.     Left lower leg: Edema present.     Comments: Trace edema BLE, ambulates with walker.   Skin:    General: Skin is warm and dry.     Comments: Reddened L heel, non blanchable, pain is resolved after skin prep applied.   Neurological:     General: No focal deficit present.     Mental Status: She is alert and oriented to person, place, and time. Mental status is at baseline.     Motor: No weakness.     Gait: Gait abnormal.  Psychiatric:        Mood and Affect: Mood normal.        Behavior: Behavior normal.        Thought Content: Thought content normal.        Judgment: Judgment normal.    Labs reviewed: Recent Labs    08/03/20 0000  NA 142  K 4.0  CL 105  CO2 24*  BUN 33*  CREATININE 1.3*  CALCIUM 8.9   Recent Labs    08/03/20 0000  AST 19  ALT 8  ALKPHOS 36  ALBUMIN 3.7   Recent Labs    08/03/20 0000  WBC 4.6  HGB 12.4  HCT 39   Lab Results  Component Value Date   TSH 2.17 01/31/2020   Lab Results  Component Value Date   HGBA1C 5.7 08/03/2020   Lab Results  Component Value Date   CHOL 211 (A) 06/16/2017   HDL 74 (A) 06/16/2017   LDLCALC 117 06/16/2017   TRIG 96 06/16/2017    Significant Diagnostic Results in last 30 days:  No results found.  Assessment/Plan Hemorrhoids hemorrhoidal bleed this morning. The patient denied rectal pain, admitted stone was hard,  but ale to eliminated. Denied nausea, vomiting, or abd pain. Avoid constipation, try Colace for now, have Hydrocortisone 2% cream prn available to the patient. Observe.   Osteoarthritis of spine with radiculopathy, lumbar region OA, pain in the right hip, worsened in morning, on Gabapentin, Norco            Chronic diastolic congestive heart failure (HCC) CHF/edema, compensated, on  Furosemide  Type 2 diabetes mellitus with diabetic chronic kidney disease ([place]) Diet controlled T2DM  Osteopenia of neck of left femur takes Vit D, Ca    Family/ staff Communication: plan of care reviewed with the patient and charge nurse.   Labs/tests ordered:  none   Time spend 40 minutes.

## 2021-05-09 ENCOUNTER — Encounter: Payer: Self-pay | Admitting: Nurse Practitioner

## 2021-07-02 ENCOUNTER — Encounter: Payer: Self-pay | Admitting: Orthopedic Surgery

## 2021-07-02 ENCOUNTER — Non-Acute Institutional Stay (INDEPENDENT_AMBULATORY_CARE_PROVIDER_SITE_OTHER): Payer: Medicare Other | Admitting: Orthopedic Surgery

## 2021-07-02 DIAGNOSIS — Z0000 Encounter for general adult medical examination without abnormal findings: Secondary | ICD-10-CM | POA: Diagnosis not present

## 2021-07-02 NOTE — Progress Notes (Signed)
Subjective:   Yolanda [name] is a 88 y.o. female who presents for Medicare Annual (Subsequent) preventive examination.  Review of Systems     Cardiac Risk Factors include: advanced age (>75mn, >>45women);diabetes mellitus;hypertension;sedentary lifestyle     Objective:    Today's Vitals   07/02/21 1329  BP: 126/80  Pulse: (!) 57  Resp: 18  Temp: 98 F (36.7 C)  SpO2: 92%  Weight: 163 lb 3.2 oz (74 kg)  Height: _0  (1.6 m)   Body mass index is 28.91 kg/m.  Advanced Directives 07/02/2021 05/08/2021 04/27/2021 01/11/2021 08/03/2020 10/15/2019 07/19/2019  Does Patient Have a Medical Advance Directive? _1  Yes Yes  Type of AParamedicof [place] will;Out of facility DNR (pink MOST or yellow form) [place];Out of facility DNR (pink MOST or yellow form) [place]Living will;Out of facility DNR (pink MOST or yellow form) HM[place]Living will;Out of facility DNR (pink MOST or yellow form) HGutierrezLiving will;Out of facility DNR (pink MOST or yellow form) [place] will;Out of facility DNR (pink MOST or yellow form) Out of facility DNR (pink MOST or yellow form);Living will;Healthcare Power of Attorney  Does patient want to make changes to medical advance directive? No - Patient declined No - Patient declined No - Patient declined No - Patient declined No - Patient declined No - Patient declined No - Patient declined  Copy of [place] Chart? Yes - validated most recent copy scanned in chart (See row information) Yes - validated most recent copy scanned in chart (See row information) Yes - validated most recent copy scanned in chart (See row information) Yes - validated most recent copy scanned in chart (See row information) Yes - validated most recent copy scanned in chart (See row information) Yes -  validated most recent copy scanned in chart (See row information) Yes - validated most recent copy scanned in chart (See row information)  Would patient like information on creating a medical advance directive? - - - - - - -  Pre-existing out of facility DNR order (yellow form or pink MOST form) Pink MOST form placed in chart (order not valid for inpatient use);Yellow form placed in chart (order not valid for inpatient use) Pink MOST form placed in chart (order not valid for inpatient use);Yellow form placed in chart (order not valid for inpatient use) Pink MOST form placed in chart (order not valid for inpatient use);Yellow form placed in chart (order not valid for inpatient use) Yellow form placed in chart (order not valid for inpatient use);Pink MOST form placed in chart (order not valid for inpatient use) Pink MOST form placed in chart (order not valid for inpatient use);Yellow form placed in chart (order not valid for inpatient use) - Yellow form placed in chart (order not valid for inpatient use);Pink MOST form placed in chart (order not valid for inpatient use)    Current Medications (verified) Outpatient Encounter Medications as of 07/02/2021  Medication Sig   acetaminophen (TYLENOL) 500 MG tablet Take 500 mg by mouth 2 (two) times daily.    calcium carbonate (TUMS - DOSED IN MG ELEMENTAL CALCIUM) 500 MG chewable tablet Chew 2 tablets by mouth every morning.    cholecalciferol (VITAMIN D) 1000 units tablet Take 1,000 Units by mouth daily.   docusate sodium (COLACE) 100 MG capsule Take 100 mg by mouth 2 (two) times daily.  furosemide (LASIX) 20 MG tablet Take 1 tablet (20 mg total) by mouth daily.   gabapentin (NEURONTIN) 300 MG capsule Take 300 mg by mouth 2 (two) times daily.   HYDROcodone-acetaminophen (NORCO/VICODIN) 5-325 MG tablet Take one tablet by mouth three times daily for pain   hydrocortisone 2.5 % cream Apply topically as needed.   loperamide (IMODIUM A-D) 2 MG tablet Take 4 mg by  mouth as needed for diarrhea or loose stools. If 3 loose stools in 24 hours, hold all laxatives and stool softeners. Give Imodium AD (loperamide) 4 mg po initial dose, then 2 mg po after each loose stool X 48 hrs.(maximum of 32m in 24 hrs. If diarrhea continues, notify MD   Multiple Vitamins-Minerals (ICAPS MV PO) Take 1 tablet by mouth daily.   Multiple Vitamins-Minerals (MULTIVITAMIN WITH MINERALS) tablet Take 1 tablet by mouth daily.    nystatin (MYCOSTATIN/NYSTOP) powder Apply to groin 2 times daily as needed   Sodium Fluoride 1.1 % PSTE Place onto teeth daily.   No facility-administered encounter medications on file as of 07/02/2021.    Allergies (verified) Epinephrine, Hydrocodone-acetaminophen, Labetalol, and Procaine   History: Past Medical History:  Diagnosis Date   Acute blood loss anemia    Allergy    Asthma    AVM (arteriovenous malformation) of colon with hemorrhage    Bursitis of right hip    Cataract    CKD (chronic kidney disease), stage III ([facility] 11/04/2015   COLONIC POLYPS 03/26/2005   Qualifier: Diagnosis of  By: MTalbert CageCMA (AAMA), June     DIVERTICULOSIS, COLON 03/26/2005   Qualifier: Diagnosis of  By: MTalbert CageCMA (Deborra Medina, June     DVT (deep venous thrombosis), left 11/03/2015   Ecchymosis 12/16/2015   Hyperglycemia 12/16/2015   Hyperlipidemia    Hypertension    Multiple skin tears 12/16/2015   Osteopenia    Stasis edema of both lower extremities 11/04/2015   Unstable gait 12/16/2015   Urine incontinence 12/16/2015   Past Surgical History:  Procedure Laterality Date   ABDOMINAL HYSTERECTOMY  2006   Dr. WMaryelizabeth Rowan  CATARACT EXTRACTION W/ INTRAOCULAR LENS IMPLANT Bilateral 1999/2003   Dr. 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 [place]/08/15   Walks with a walker.   NOK: 2 sons with shared POA (they live far away) - DShanon BrowDintinfass would be first call.   Never smoked   Alcohol none   Exercise none   POA, Living Will,  MOST             Social Determinants of Health   Financial Resource Strain: Low Risk    Difficulty of Paying Living Expenses: Not hard at all  Food Insecurity: No Food Insecurity   Worried About Charity fundraiser in the Last Year: Never true   Ran Out of Food in the Last Year: Never true  Transportation Needs: No Transportation Needs   Lack of Transportation (Medical): No   Lack of Transportation (Non-Medical): No  Physical Activity: Sufficiently Active   Days of Exercise per Week: 7 days   Minutes of Exercise per Session: 30 min  Stress: No Stress Concern Present   Feeling of Stress : Not at all  Social Connections: Socially Isolated   Frequency of Communication with Friends and Family: More than three times a week   Frequency of Social Gatherings with Friends and Family: More than three times a week   Attends Religious Services: Never   Marine scientist or Organizations: No   Attends Archivist Meetings: Never   Marital Status: Widowed    Tobacco Counseling Counseling given: Not Answered   Clinical  Intake:  Pre-visit preparation completed: Yes  Pain : No/denies pain     BMI - recorded: 28.91 Nutritional Status: BMI 25 -29 Overweight Nutritional Risks: None Diabetes: Yes CBG done?: No Did pt. bring in CBG monitor from home?: No  How often do you need to have someone help you when you read instructions, pamphlets, or other written materials from your doctor or pharmacy?: 3 - Sometimes What is the last grade level you completed in school?: college graduate  Diabetic?Yes  Interpreter Needed?: No      Activities of Daily Living In your present state of health, do you have any difficulty performing the following activities: 07/02/2021  Hearing? N  Vision? N  Difficulty concentrating or making decisions? N  Walking or climbing stairs? N  Dressing or bathing? N  Doing errands, shopping? Y  Preparing Food and eating ? Y  Using the Toilet? N  In the past six months, have you accidently leaked urine? Y  Do you have problems with loss of bowel control? Y  Managing your Medications? Y  Managing your Finances? Y  Housekeeping or managing your Housekeeping? Y  Some recent data might be hidden    Patient Care Team: [name], MD as PCP - General (Internal Medicine) Rutherford Guys, MD as Consulting Physician (Ophthalmology) [name], MD as Consulting Physician (Internal Medicine)  Indicate any recent Medical Services you may have received from other than [facility] providers in the past year (date may be approximate).     Assessment:   This is a routine wellness examination for [facility].  Hearing/Vision screen No results found.  Dietary issues and exercise activities discussed: Current Exercise Habits: The patient does not participate in regular exercise at present, Exercise limited by: cardiac condition(s)   Goals Addressed             This Visit's Progress    Maintain Mobility and Function   On track    Evidence-based guidance:  Emphasize the importance  of physical activity and aerobic exercise as included in treatment plan; assess barriers to adherence; consider patient's abilities and preferences.  Encourage gradual increase in activity or exercise instead of stopping if pain occurs.  Reinforce individual therapy exercise prescription, such as strengthening, stabilization and stretching programs.  Promote optimal body mechanics to stabilize the  spine with lifting and functional activity.  Encourage activity and mobility modifications to facilitate optimal function, such as using a log roll for bed mobility or dressing from a seated position.  Reinforce individual adaptive equipment recommendations to limit excessive spinal movements, such as a Systems analyst.  Assess adequacy of sleep; encourage use of sleep hygiene techniques, such as bedtime routine; use of white noise; dark, cool bedroom; avoiding daytime naps, heavy meals or exercise before bedtime.  Promote positions and modification to optimize sleep and sexual activity; consider pillows or positioning devices to assist in maintaining neutral spine.  Explore options for applying ergonomic principles at work and home, such as frequent position changes, using ergonomically designed equipment and working at optimal height.  Promote modifications to increase comfort with driving such as lumbar support, optimizing seat and steering wheel position, using cruise control and taking frequent rest stops to stretch and walk.   Notes:        Depression Screen PHQ 2/9 Scores 07/02/2021 01/05/2018 12/30/2016 11/03/2015  PHQ - 2 Score 0 0 0 0    Fall Risk Fall Risk  07/02/2021 01/05/2018 12/30/2016 11/03/2015  Falls in the past year? 0 No No No  Number falls in past yr: 0 - - -  Injury with Fall? 0 - - -  Risk for fall due to : History of fall(s);Impaired balance/gait;Impaired mobility - - -  Follow up Falls evaluation completed;Education provided - - -    FALL RISK PREVENTION PERTAINING TO THE  HOME:  Any stairs in or around the home? No  If so, are there any without handrails? No  Home free of loose throw rugs in walkways, pet beds, electrical cords, etc? Yes  Adequate lighting in your home to reduce risk of falls? Yes   ASSISTIVE DEVICES UTILIZED TO PREVENT FALLS:  Life alert? No  Use of a cane, walker or w/c? Yes  Grab bars in the bathroom? Yes  Shower chair or bench in shower? Yes  Elevated toilet seat or a handicapped toilet? Yes   TIMED UP AND GO:  Was the test performed? No .  Length of time to ambulate 10 feet: N/A sec.   Gait slow and steady with assistive device  Cognitive Function: MMSE - Mini Mental State Exam 07/02/2021 01/05/2018 12/30/2016  Orientation to time _0 Orientation to Place _1 Registration _2 Attention/ Calculation _3 Recall _4 Language- name 2 objects _5 Language- repeat _6 Language- follow 3 step command _7 Language- read & follow direction _8 Write a sentence _9 Copy design _10 Total score _11 6CIT Screen 07/02/2021  What Year? 0 points  What month? 0 points  What time? 3 points  Count back from 20 0 points  Months in reverse 0 points  Repeat phrase 0 points  Total Score 3    Immunizations Immunization History  Administered Date(s) Administered   DTaP 05/13/2005   Influenza, High Dose Seasonal PF 02/23/2019, 02/15/2020   Influenza-Unspecified 02/11/2015, 02/22/2016, 02/19/2017, 02/16/2018, 03/06/2021   Moderna SARS-COV2 Booster Vaccination 10/18/2020   Moderna Sars-Covid-2 Vaccination 05/17/2019, 06/14/2019, 03/27/2020   PFIZER(Purple Top)SARS-COV-2 Vaccination 01/31/2021   PPD Test 12/08/2015, 12/22/2015   Pneumococcal Conjugate-13 05/13/2014   Pneumococcal Polysaccharide-23 05/13/2014, 03/17/2019   Td 12/11/2005   Tdap 01/04/2017   Zoster, Live 05/13/2014  TDAP status: Up to date  Flu Vaccine status: Up to date  Pneumococcal vaccine status: Up to  date  Covid-19 vaccine status: Completed vaccines  Qualifies for Shingles Vaccine? Yes   Zostavax completed Yes   Shingrix Completed?: No.    Education has been provided regarding the importance of this vaccine. Patient has been advised to call insurance company to determine out of pocket expense if they have not yet received this vaccine. Advised may also receive vaccine at local pharmacy or Health Dept. Verbalized acceptance and understanding.  Screening Tests Health Maintenance  Topic Date Due   URINE MICROALBUMIN  07/07/2018   COVID-19 Vaccine (5 - Booster for Moderna series) 03/28/2021   Zoster Vaccines- Shingrix (1 of 2) 07/28/2021 (Originally 09/12/1975)   HEMOGLOBIN A1C  11/01/2021   OPHTHALMOLOGY EXAM  02/28/2022   FOOT EXAM  04/26/2022   TETANUS/TDAP  01/05/2027   Pneumonia Vaccine 71+ Years old  Completed   INFLUENZA VACCINE  Completed   DEXA SCAN  Completed   HPV VACCINES  Aged Out    Health Maintenance  Health Maintenance Due  Topic Date Due   URINE MICROALBUMIN  07/07/2018   COVID-19 Vaccine (5 - Booster for Moderna series) 03/28/2021    Colorectal cancer screening: No longer required.   Mammogram status: No longer required due to advanced age.  Bone Density status: Completed 2019. Results reflect: Bone density results: OSTEOPENIA. Repeat every 2- refused years.  Lung Cancer Screening: (Low Dose CT Chest recommended if Age 35-80 years, 30 pack-year currently smoking OR have quit w/in 15years.) does not qualify.   Lung Cancer Screening Referral: No  Additional Screening:  Hepatitis C Screening: does not qualify; advanced age  Vision Screening: Recommended annual ophthalmology exams for early detection of glaucoma and other disorders of the eye. Is the patient up to date with their annual eye exam?  Yes  Who is the provider or what is the name of the office in which the patient attends annual eye exams? Dr. Gershon Crane If pt is not established with a provider,  would they like to be referred to a provider to establish care? No .   Dental Screening: Recommended annual dental exams for proper oral hygiene  Community Resource Referral / Chronic Care Management: CRR required this visit?  No   CCM required this visit?  No      Plan:     I have personally reviewed and noted the following in the patients chart:   Medical and social history Use of alcohol, tobacco or illicit drugs  Current medications and supplements including opioid prescriptions.  Functional ability and status Nutritional status Physical activity Advanced directives List of other physicians Hospitalizations, surgeries, and ER visits in previous 12 months Vitals Screenings to include cognitive, depression, and falls Referrals and appointments  In addition, I have reviewed and discussed with patient certain preventive protocols, quality metrics, and best practice recommendations. A written personalized care plan for preventive services as well as general preventive health recommendations were provided to patient.     Yvonna Alanis, NP   07/02/2021

## 2021-07-02 NOTE — Patient Instructions (Signed)
°  Ms. Yolanda [name] , Thank you for taking time to come for your Medicare Wellness Visit. I appreciate your ongoing commitment to your health goals. Please review the following plan we discussed and let me know if I can assist you in the future.   These are the goals we discussed:  Goals      Maintain Mobility and Function     Evidence-based guidance:  Emphasize the importance of physical activity and aerobic exercise as included in treatment plan; assess barriers to adherence; consider patient's abilities and preferences.  Encourage gradual increase in activity or exercise instead of stopping if pain occurs.  Reinforce individual therapy exercise prescription, such as strengthening, stabilization and stretching programs.  Promote optimal body mechanics to stabilize the spine with lifting and functional activity.  Encourage activity and mobility modifications to facilitate optimal function, such as using a log roll for bed mobility or dressing from a seated position.  Reinforce individual adaptive equipment recommendations to limit excessive spinal movements, such as a Systems analyst.  Assess adequacy of sleep; encourage use of sleep hygiene techniques, such as bedtime routine; use of white noise; dark, cool bedroom; avoiding daytime naps, heavy meals or exercise before bedtime.  Promote positions and modification to optimize sleep and sexual activity; consider pillows or positioning devices to assist in maintaining neutral spine.  Explore options for applying ergonomic principles at work and home, such as frequent position changes, using ergonomically designed equipment and working at optimal height.  Promote modifications to increase comfort with driving such as lumbar support, optimizing seat and steering wheel position, using cruise control and taking frequent rest stops to stretch and walk.   Notes:         This is a list of the screening recommended for you and due dates:  Health  Maintenance  Topic Date Due   Urine Protein Check  07/07/2018   COVID-19 Vaccine (5 - Booster for Moderna series) 03/28/2021   Zoster (Shingles) Vaccine (1 of 2) 07/28/2021*   Hemoglobin A1C  11/01/2021   Eye exam for diabetics  02/28/2022   Complete foot exam   04/26/2022   Tetanus Vaccine  01/05/2027   Pneumonia Vaccine  Completed   Flu Shot  Completed   DEXA scan (bone density measurement)  Completed   HPV Vaccine  Aged Out  *Topic was postponed. The date shown is not the original due date.   Recommend bone density since still ambulatory

## 2021-07-03 ENCOUNTER — Other Ambulatory Visit: Payer: Self-pay | Admitting: *Deleted

## 2021-07-03 DIAGNOSIS — M25511 Pain in right shoulder: Secondary | ICD-10-CM

## 2021-07-03 DIAGNOSIS — M25551 Pain in right hip: Secondary | ICD-10-CM

## 2021-07-03 DIAGNOSIS — G8929 Other chronic pain: Secondary | ICD-10-CM

## 2021-07-03 MED ORDER — HYDROCODONE-ACETAMINOPHEN 5-325 MG PO TABS: ORAL_TABLET | ORAL | 0 refills | Status: DC

## 2021-07-03 NOTE — Telephone Encounter (Signed)
[facility] sent refill Request.  Pended Rx and sent to Amy for approval.

## 2021-07-12 DIAGNOSIS — E1129 Type 2 diabetes mellitus with other diabetic kidney complication: Secondary | ICD-10-CM | POA: Diagnosis not present

## 2021-07-20 DIAGNOSIS — M79671 Pain in right foot: Secondary | ICD-10-CM | POA: Diagnosis not present

## 2021-07-20 DIAGNOSIS — M79672 Pain in left foot: Secondary | ICD-10-CM | POA: Diagnosis not present

## 2021-07-20 DIAGNOSIS — L602 Onychogryphosis: Secondary | ICD-10-CM | POA: Diagnosis not present

## 2021-07-20 DIAGNOSIS — L84 Corns and callosities: Secondary | ICD-10-CM | POA: Diagnosis not present

## 2021-07-26 ENCOUNTER — Encounter: Payer: Self-pay | Admitting: Internal Medicine

## 2021-07-26 ENCOUNTER — Non-Acute Institutional Stay: Payer: Medicare Other | Admitting: Internal Medicine

## 2021-07-26 DIAGNOSIS — N1831 Chronic kidney disease, stage 3a: Secondary | ICD-10-CM | POA: Diagnosis not present

## 2021-07-26 DIAGNOSIS — E1122 Type 2 diabetes mellitus with diabetic chronic kidney disease: Secondary | ICD-10-CM

## 2021-07-26 DIAGNOSIS — G609 Hereditary and idiopathic neuropathy, unspecified: Secondary | ICD-10-CM | POA: Diagnosis not present

## 2021-07-26 DIAGNOSIS — I5032 Chronic diastolic (congestive) heart failure: Secondary | ICD-10-CM | POA: Diagnosis not present

## 2021-07-26 DIAGNOSIS — M159 Polyosteoarthritis, unspecified: Secondary | ICD-10-CM

## 2021-07-26 NOTE — Progress Notes (Signed)
?Location:   [facility] Room Number: 23 ?Place of Service:  ALF (13) ?Provider:  Veleta Miners MD  ? ?Yolanda Dad, MD ? ?Patient Care Team: ?Yolanda Dad, MD as PCP - General (Internal Medicine) ?Rutherford Guys, MD as Consulting Physician (Ophthalmology) ?Yolanda Dad, MD as Consulting Physician (Internal Medicine) ? ?Extended Emergency Contact Information ?Primary Emergency Contact: [name],Yolanda ?Address: [address] ?         [place] [address] [place] ?Home Phone: [phone] ?Work Phone: [phone] ?Mobile Phone: [phone] ?Relation: Son ?Secondary Emergency Contact: [name],Yolanda ? [place] ?Home Phone: [phone] ?Relation: Friend ? ?Code Status:  DNR Managed Care ?Goals of care: Advanced Directive information ?Advanced Directives 07/26/2021  ?Does Patient Have a Medical Advance Directive? Yes  ?Type of Paramedic of [place];Living will;Out of facility DNR (pink MOST or yellow form)  ?Does patient want to make changes to medical advance directive? No - Patient declined  ?Copy of [place] in Chart? Yes - validated most recent copy scanned in chart (See row information)  ?Would patient like information on creating a medical advance directive? -  ?Pre-existing out of facility DNR order (yellow form or pink MOST form) Pink MOST form placed in chart (order not valid for inpatient use);Yellow form placed in chart (order not valid for inpatient use)  ? ? ? ?Chief Complaint  ?Patient presents with  ? Medical Management of Chronic Issues  ? ? ?HPI:  ?Pt is a 88 y.o. female seen today for medical management of chronic diseases.   ? ?Patient has h/o Hypertension, Diabetes Mellitus, H/o CKD,, Low back Pain and Arthritis ?  ? ?She is stable. No new Nursing issues.  ?Her only complain is Arthritis in her Hands getting worse ?But pain controlled ?Her weight is stable ?Walks with her  walker ?No Falls. Stays independent ?Wt Readings from Last 3 Encounters:  ?07/26/21 161 lb 12.8 oz (73.4 kg)  ?07/02/21 163 lb 3.2 oz (74 kg)  ?05/08/21 161 lb 3.2 oz (73.1 kg)  ? ?Past Medical History:  ?Diagnosis Date  ? Acute blood loss anemia   ? Allergy   ? Asthma   ? AVM (arteriovenous malformation) of colon with hemorrhage   ? Bursitis of right hip   ? Cataract   ? CKD (chronic kidney disease), stage III ([place]) 11/04/2015  ? COLONIC POLYPS 03/26/2005  ? Qualifier: Diagnosis of  By: Talbert Cage CMA Deborra Medina), June    ? DIVERTICULOSIS, COLON 03/26/2005  ? Qualifier: Diagnosis of  By: Talbert Cage CMA Deborra Medina), June    ? DVT (deep venous thrombosis), left 11/03/2015  ? Ecchymosis 12/16/2015  ? Hyperglycemia 12/16/2015  ? Hyperlipidemia   ? Hypertension   ? Multiple skin tears 12/16/2015  ? Osteopenia   ? Stasis edema of both lower extremities 11/04/2015  ? Unstable gait 12/16/2015  ? Urine incontinence 12/16/2015  ? ?Past Surgical History:  ?Procedure Laterality Date  ? ABDOMINAL HYSTERECTOMY  2006  ? Dr. Maryelizabeth Rowan  ? CATARACT EXTRACTION W/ INTRAOCULAR LENS IMPLANT Bilateral 1999/2003  ? Dr. Rutherford Guys  ? COLONOSCOPY N/A 11/06/2015  ? Procedure: COLONOSCOPY;  Surgeon: Mauri Pole, MD;  Location: [facility];  Service: Endoscopy;  Laterality: N/A;  MAC if available, otherwise moderate sedation  ? ? ?Allergies  ?Allergen Reactions  ? Epinephrine Nausea Only  ?  Unknown.  ? Hydrocodone-Acetaminophen   ?  REACTION: nausea  ? Labetalol   ?  Procaine   ? ? ?Allergies as of 07/26/2021   ? ?   Reactions  ? Epinephrine Nausea Only  ? Unknown.  ? Hydrocodone-acetaminophen   ? REACTION: nausea  ? Labetalol   ? Procaine   ? ?  ? ?  ?Medication List  ?  ? ?  ? Accurate as of July 26, 2021  3:08 PM. If you have any questions, ask your nurse or doctor.  ?  ?  ? ?  ? ?acetaminophen 500 MG tablet ?Commonly known as: TYLENOL ?Take 500 mg by mouth 2 (two) times daily. ?  ?calcium carbonate 500 MG chewable tablet ?Commonly known as: TUMS - dosed in  mg elemental calcium ?Chew 2 tablets by mouth every morning. ?  ?cholecalciferol 1000 units tablet ?Commonly known as: VITAMIN D ?Take 1,000 Units by mouth daily. ?  ?docusate sodium 100 MG capsule ?Commonly known as: COLACE ?Take 100 mg by mouth 2 (two) times daily. ?  ?furosemide 20 MG tablet ?Commonly known as: LASIX ?Take 1 tablet (20 mg total) by mouth daily. ?  ?gabapentin 300 MG capsule ?Commonly known as: NEURONTIN ?Take 300 mg by mouth 2 (two) times daily. ?  ?HYDROcodone-acetaminophen 5-325 MG tablet ?Commonly known as: NORCO/VICODIN ?Take one tablet by mouth three times daily for pain ?  ?hydrocortisone 2.5 % cream ?Apply topically as needed. ?  ?loperamide 2 MG tablet ?Commonly known as: IMODIUM A-D ?Take 4 mg by mouth as needed for diarrhea or loose stools. If 3 loose stools in 24 hours, hold all laxatives and stool softeners. Give Imodium AD (loperamide) 4 mg po initial dose, then 2 mg po after each loose stool X 48 hrs.(maximum of 64m in 24 hrs. If diarrhea continues, notify MD ?  ?multivitamin with minerals tablet ?Take 1 tablet by mouth daily. ?  ?ICAPS MV PO ?Take 1 tablet by mouth daily. ?  ?nystatin powder ?Commonly known as: MYCOSTATIN/NYSTOP ?Apply to groin 2 times daily as needed ?  ?Sodium Fluoride 1.1 % Pste ?Place onto teeth daily. ?  ? ?  ? ? ?Review of Systems  ?Constitutional:  Negative for activity change and appetite change.  ?HENT: Negative.    ?Respiratory:  Negative for cough and shortness of breath.   ?Cardiovascular:  Negative for leg swelling.  ?Gastrointestinal:  Negative for constipation.  ?Genitourinary: Negative.   ?Musculoskeletal:  Positive for arthralgias and myalgias. Negative for gait problem.  ?Skin: Negative.   ?Neurological:  Negative for dizziness and weakness.  ?Psychiatric/Behavioral:  Negative for confusion, dysphoric mood and sleep disturbance.   ? ?Immunization History  ?Administered Date(s) Administered  ? DTaP 05/13/2005  ? Influenza, High Dose Seasonal PF  02/23/2019, 02/15/2020  ? Influenza-Unspecified 02/11/2015, 02/22/2016, 02/19/2017, 02/16/2018, 03/06/2021  ? Moderna SARS-COV2 Booster Vaccination 10/18/2020  ? Moderna Sars-Covid-2 Vaccination 05/17/2019, 06/14/2019, 03/27/2020  ? PFIZER(Purple Top)SARS-COV-2 Vaccination 01/31/2021  ? PPD Test 12/08/2015, 12/22/2015  ? Pneumococcal Conjugate-13 05/13/2014  ? Pneumococcal Polysaccharide-23 05/13/2014, 03/17/2019  ? Td 12/11/2005  ? Tdap 01/04/2017  ? Zoster, Live 05/13/2014  ? ?Pertinent  Health Maintenance Due  ?Topic Date Due  ? HEMOGLOBIN A1C  11/01/2021  ? OPHTHALMOLOGY EXAM  02/28/2022  ? FOOT EXAM  04/26/2022  ? URINE MICROALBUMIN  07/13/2022  ? INFLUENZA VACCINE  Completed  ? DEXA SCAN  Completed  ? ?Fall Risk 11/03/2015 12/30/2016 01/05/2018 07/02/2021  ?Falls in the past year? No No No 0  ?Was there an injury with Fall? - - - 0  ?Fall Risk Category Calculator - - -  0  ?Fall Risk Category - - - Low  ?Patient Fall Risk Level - - - Low fall risk  ?Patient at Risk for Falls Due to - - - History of fall(s);Impaired balance/gait;Impaired mobility  ?Fall risk Follow up - - - Falls evaluation completed;Education provided  ? ?Functional Status Survey: ?  ? ?Vitals:  ? 07/26/21 1450  ?BP: 118/62  ?Pulse: 64  ?Resp: 18  ?Temp: 98.1 ?F (36.7 ?C)  ?SpO2: 94%  ?Weight: 161 lb 12.8 oz (73.4 kg)  ?Height: _0  (1.6 m)  ? ?Body mass index is 28.66 kg/m?Marland Kitchen ?Physical Exam ?Vitals reviewed.  ?Constitutional:   ?   Appearance: Normal appearance.  ?HENT:  ?   Head: Normocephalic.  ?   Nose: Nose normal.  ?   Mouth/Throat:  ?   Mouth: Mucous membranes are moist.  ?   Pharynx: Oropharynx is clear.  ?Eyes:  ?   Pupils: Pupils are equal, round, and reactive to light.  ?Cardiovascular:  ?   Rate and Rhythm: Normal rate and regular rhythm.  ?   Pulses: Normal pulses.  ?   Heart sounds: Normal heart sounds. No murmur heard. ?Pulmonary:  ?   Effort: Pulmonary effort is normal.  ?   Breath sounds: Normal breath sounds.  ?Abdominal:  ?    General: Abdomen is flat. Bowel sounds are normal.  ?   Palpations: Abdomen is soft.  ?Musculoskeletal:  ?   Cervical back: Neck supple.  ?   Comments: Mild Edema Left more then right ?Arthritis changes in hand

## 2021-07-27 NOTE — Addendum Note (Signed)
Addended by: Georgina Snell on: 07/27/2021 08:45 AM ? ? Modules accepted: Level of Service ? ?

## 2021-09-20 ENCOUNTER — Other Ambulatory Visit: Payer: Self-pay

## 2021-09-20 DIAGNOSIS — G8929 Other chronic pain: Secondary | ICD-10-CM

## 2021-09-20 DIAGNOSIS — M25551 Pain in right hip: Secondary | ICD-10-CM

## 2021-09-20 NOTE — Telephone Encounter (Signed)
Refill request received from [facility] for Hydrococone/APAP 5/'325mg'$  tablet take one tablet twice a day.Medication list states take three times a day. Please clarify directions.Allergy alert came up with medication ? ?Medication pended and sent to Veleta Miners, MD ? ? ?

## 2021-09-24 DIAGNOSIS — M6281 Muscle weakness (generalized): Secondary | ICD-10-CM | POA: Diagnosis not present

## 2021-09-24 DIAGNOSIS — R2681 Unsteadiness on feet: Secondary | ICD-10-CM | POA: Diagnosis not present

## 2021-11-05 ENCOUNTER — Encounter: Payer: Self-pay | Admitting: Orthopedic Surgery

## 2021-11-05 ENCOUNTER — Non-Acute Institutional Stay: Payer: Medicare Other | Admitting: Orthopedic Surgery

## 2021-11-05 DIAGNOSIS — I5032 Chronic diastolic (congestive) heart failure: Secondary | ICD-10-CM

## 2021-11-05 DIAGNOSIS — N1831 Chronic kidney disease, stage 3a: Secondary | ICD-10-CM

## 2021-11-05 DIAGNOSIS — M85852 Other specified disorders of bone density and structure, left thigh: Secondary | ICD-10-CM | POA: Diagnosis not present

## 2021-11-05 DIAGNOSIS — M159 Polyosteoarthritis, unspecified: Secondary | ICD-10-CM | POA: Diagnosis not present

## 2021-11-05 DIAGNOSIS — E1122 Type 2 diabetes mellitus with diabetic chronic kidney disease: Secondary | ICD-10-CM | POA: Diagnosis not present

## 2021-11-05 DIAGNOSIS — R2681 Unsteadiness on feet: Secondary | ICD-10-CM | POA: Diagnosis not present

## 2021-11-05 DIAGNOSIS — K642 Third degree hemorrhoids: Secondary | ICD-10-CM | POA: Diagnosis not present

## 2021-11-05 DIAGNOSIS — G609 Hereditary and idiopathic neuropathy, unspecified: Secondary | ICD-10-CM

## 2021-11-05 NOTE — Progress Notes (Signed)
Location:  [facility] Room Number: 23-A Place of Service:  ALF [phone]) Provider:  Octavia Heir, NP   Mahlon Gammon, MD  Patient Care Team: Mahlon Gammon, MD as PCP - General (Internal Medicine) Jethro Bolus, MD as Consulting Physician (Ophthalmology) Mahlon Gammon, MD as Consulting Physician (Internal Medicine)  Extended Emergency Contact Information Primary Emergency Contact: Decamp,Leonard Address: [address]          [place], [place] [address] [place] Home Phone: [phone] Work Phone: [phone] Mobile Phone: [phone] Relation: Son Secondary Emergency Contact: Starling Manns [place] Home Phone: [phone] Relation: Friend  Code Status:  DNR Goals of care: Advanced Directive information    11/05/2021   11:43 AM  Advanced Directives  Does Patient Have a Medical Advance Directive? Yes  Type of Estate agent of [place];Living will;Out of facility DNR (pink MOST or yellow form)  Does patient want to make changes to medical advance directive? No - Patient declined  Copy of Healthcare Power of Attorney in Chart? Yes - validated most recent copy scanned in chart (See row information)     Chief Complaint  Patient presents with   Medical Management of Chronic Issues    Routine Visit.   Immunizations    Discuss the need for Shingrix vaccine.    Health Maintenance    Discuss the need for Hemoglobin A1C.    HPI:  Pt is a 88 y.o. female seen today for medical management of chronic diseases.    She currently resides on the assisted living unit at [facility]. PMH: CHF, PVD,  hx of DVT, diverticulosis, T2DM, OA spine, peripheral neuropathy, and CKD III.   OA- involves lower back/hips/hands, right hand most bothersome- she is squeezing stress ball daily- able to eat/write without difficulty,  denies increased pain, norco recently reduced per patient request to  bid Unstable gait- ambulates well with walker, no recent falls Osteopenia- DEXA 2015, tscore -2.3, remains on calcium and vitamin D T2DM- A1c 5.7 05/03/2021, not on medications, remains on carb modified diet CHF- no recent weight fluctuations/sob/ankle edema, Echo LVEF 55-60% 2017, remains on furosemide daily CKD 3a- BUN/creat 29/1.2 05/03/2021 Neuropathy- denies increased pain/numbness, remains on gabapentin Hemorrhoids- remains on colace  Recent blood pressures:  06/21- 126/62  06/14- 125/64   06/07- 144/71  Recent weights:  06/01- 156.2 lbs  05/01- 157.4 lbs  04/03- 160.4 lbs   Past Medical History:  Diagnosis Date   Acute blood loss anemia    Allergy    Asthma    AVM (arteriovenous malformation) of colon with hemorrhage    Bursitis of right hip    Cataract    CKD (chronic kidney disease), stage III (HCC) 11/04/2015   COLONIC POLYPS 03/26/2005   Qualifier: Diagnosis of  By: Glyn Ade CMA (AAMA), June     DIVERTICULOSIS, COLON 03/26/2005   Qualifier: Diagnosis of  By: Glyn Ade CMA (AAMA), June     DVT (deep venous thrombosis), left 11/03/2015   Ecchymosis 12/16/2015   Hyperglycemia 12/16/2015   Hyperlipidemia    Hypertension    Multiple skin tears 12/16/2015   Osteopenia    Stasis edema of both lower extremities 11/04/2015   Unstable gait 12/16/2015   Urine incontinence 12/16/2015   Past Surgical History:  Procedure Laterality Date   ABDOMINAL HYSTERECTOMY  2006   Dr. Delia Heady   CATARACT EXTRACTION W/ INTRAOCULAR LENS IMPLANT Bilateral 1999/2003   Dr. Jethro Bolus  COLONOSCOPY N/A 11/06/2015   Procedure: COLONOSCOPY;  Surgeon: Napoleon Form, MD;  Location: [facility];  Service: Endoscopy;  Laterality: N/A;  MAC if available, otherwise moderate sedation    Allergies  Allergen Reactions   Epinephrine Nausea Only    Unknown.   Hydrocodone-Acetaminophen     REACTION: nausea   Labetalol    Procaine     Outpatient Encounter Medications as of 11/05/2021   Medication Sig   acetaminophen (TYLENOL) 500 MG tablet Take 500 mg by mouth 2 (two) times daily.    calcium carbonate (TUMS - DOSED IN MG ELEMENTAL CALCIUM) 500 MG chewable tablet Chew 2 tablets by mouth every morning.    cholecalciferol (VITAMIN D) 1000 units tablet Take 1,000 Units by mouth daily.   docusate sodium (COLACE) 100 MG capsule Take 100 mg by mouth every morning.   furosemide (LASIX) 20 MG tablet Take 1 tablet (20 mg total) by mouth daily.   gabapentin (NEURONTIN) 300 MG capsule Take 300 mg by mouth 2 (two) times daily.   HYDROcodone-acetaminophen (NORCO) 10-325 MG tablet Take 1 tablet by mouth 2 (two) times daily.   hydrocortisone 2.5 % cream Apply topically as needed.   loperamide (IMODIUM A-D) 2 MG tablet Take 4 mg by mouth as needed for diarrhea or loose stools. If 3 loose stools in 24 hours, hold all laxatives and stool softeners. Give Imodium AD (loperamide) 4 mg po initial dose, then 2 mg po after each loose stool X 48 hrs.(maximum of 16mg  in 24 hrs. If diarrhea continues, notify MD   Multiple Vitamins-Minerals (ICAPS MV PO) Take 1 tablet by mouth daily.   Multiple Vitamins-Minerals (MULTIVITAMIN WITH MINERALS) tablet Take 1 tablet by mouth daily.    nystatin (MYCOSTATIN/NYSTOP) powder Apply to groin 2 times daily as needed   Sodium Fluoride 1.1 % PSTE Place onto teeth daily.   [DISCONTINUED] HYDROcodone-acetaminophen (NORCO/VICODIN) 5-325 MG tablet Take one tablet by mouth three times daily for pain   No facility-administered encounter medications on file as of 11/05/2021.    Review of Systems  Constitutional:  Negative for activity change, appetite change, chills, fatigue and fever.  HENT:  Negative for congestion and trouble swallowing.   Eyes:  Negative for visual disturbance.  Respiratory:  Negative for cough, shortness of breath and wheezing.   Cardiovascular:  Negative for chest pain and leg swelling.  Gastrointestinal:  Positive for constipation. Negative for  abdominal distention, abdominal pain, diarrhea, nausea and vomiting.  Genitourinary:  Negative for dysuria, frequency, hematuria and urgency.  Musculoskeletal:  Positive for arthralgias, back pain and gait problem.  Skin:  Negative for wound.  Neurological:  Positive for weakness. Negative for dizziness and headaches.  Psychiatric/Behavioral:  Negative for confusion, dysphoric mood and sleep disturbance. The patient is not nervous/anxious.     Immunization History  Administered Date(s) Administered   DTaP 05/13/2005   Influenza, High Dose Seasonal PF 02/23/2019, 02/15/2020   Influenza-Unspecified 02/11/2015, 02/22/2016, 02/19/2017, 02/16/2018, 03/06/2021   Moderna SARS-COV2 Booster Vaccination 10/18/2020   Moderna Sars-Covid-2 Vaccination 05/17/2019, 06/14/2019, 03/27/2020   PFIZER(Purple Top)SARS-COV-2 Vaccination 01/31/2021   PPD Test 12/08/2015, 12/22/2015   Pneumococcal Conjugate-13 05/13/2014   Pneumococcal Polysaccharide-23 05/13/2014, 03/17/2019   Td 12/11/2005   Tdap 01/04/2017   Zoster, Live 05/13/2014   Pertinent  Health Maintenance Due  Topic Date Due   HEMOGLOBIN A1C  11/01/2021   INFLUENZA VACCINE  12/11/2021   OPHTHALMOLOGY EXAM  02/28/2022   FOOT EXAM  04/26/2022   URINE MICROALBUMIN  07/13/2022  DEXA SCAN  Completed      11/03/2015   10:28 AM 12/30/2016   12:38 PM 01/05/2018   11:59 AM 07/02/2021    3:54 PM  Fall Risk  Falls in the past year? No No No 0  Was there an injury with Fall?    0  Fall Risk Category Calculator    0  Fall Risk Category    Low  Patient Fall Risk Level    Low fall risk  Patient at Risk for Falls Due to    History of fall(s);Impaired balance/gait;Impaired mobility  Fall risk Follow up    Falls evaluation completed;Education provided   Functional Status Survey:    Vitals:   11/05/21 1136  BP: 126/62  Pulse: 64  Resp: 20  Temp: 98.1 F (36.7 C)  SpO2: 93%  Weight: 156 lb 3.2 oz (70.9 kg)  Height: 5\' 3"  (1.6 m)   Body mass  index is 27.67 kg/m. Physical Exam Vitals reviewed.  Constitutional:      General: She is not in acute distress. HENT:     Head: Normocephalic.     Right Ear: There is no impacted cerumen.     Left Ear: There is no impacted cerumen.     Nose: Nose normal.     Mouth/Throat:     Mouth: Mucous membranes are moist.  Eyes:     General:        Right eye: No discharge.        Left eye: No discharge.  Cardiovascular:     Rate and Rhythm: Normal rate and regular rhythm.     Pulses: Normal pulses.     Heart sounds: Normal heart sounds.  Pulmonary:     Effort: Pulmonary effort is normal. No respiratory distress.     Breath sounds: Normal breath sounds. No wheezing.  Abdominal:     General: Bowel sounds are normal. There is no distension.     Palpations: Abdomen is soft.     Tenderness: There is no abdominal tenderness.  Musculoskeletal:     Cervical back: Neck supple.     Right lower leg: Edema present.     Left lower leg: Edema present.     Comments: Non pitting, right hand with Bouchard's nodules to 3rd and 4th PIP, non tender, right hand/fingers FROM  Skin:    General: Skin is warm and dry.     Capillary Refill: Capillary refill takes less than 2 seconds.  Neurological:     General: No focal deficit present.     Mental Status: She is alert and oriented to person, place, and time.     Motor: Weakness present.     Gait: Gait abnormal.     Comments: walker  Psychiatric:        Mood and Affect: Mood normal.        Behavior: Behavior normal.     Labs reviewed: Recent Labs    05/03/21 0000  NA 141  K 4.2  CL 102  CO2 30*  BUN 29*  CREATININE 1.2*  CALCIUM 9.4   Recent Labs    05/03/21 0000  AST 21  ALT 11  ALKPHOS 47  ALBUMIN 4.2   Recent Labs    05/03/21 0000  WBC 5.6  NEUTROABS 2,682.00  HGB 13.2  HCT 41  PLT 205   Lab Results  Component Value Date   TSH 2.25 05/03/2021   Lab Results  Component Value Date   HGBA1C 5.7 05/03/2021  Lab Results   Component Value Date   CHOL 211 (A) 06/16/2017   HDL 74 (A) 06/16/2017   LDLCALC 117 06/16/2017   TRIG 96 06/16/2017    Significant Diagnostic Results in last 30 days:  No results found.  Assessment/Plan 1. Osteoarthritis involving multiple joints on both sides of body - right hand most bothersome, lower back/hips involved - doing well with reduced norco - able to perform ADLs  2. Unstable gait - no recent falls - ambulates with walker  3. Osteopenia of neck of left femur - DEXA 2019 - cont calcium and vitamin D  4. Type 2 diabetes mellitus with chronic kidney disease, without long-term current use of insulin, unspecified CKD stage (HCC) - A1c stable - diet controlled - Eye exam with Dr. Nile Riggs scheduled 02/2022  5. Chronic diastolic congestive heart failure (HCC) - compensated - cont furosemide  6. Stage 3a chronic kidney disease (HCC) -avoid nephrotoxic drugs like NSAIDS and dose adjust medications to be renally excreted - encourage hydration with water   7. Idiopathic peripheral neuropathy - cont gabapentin  8. Grade III hemorrhoids - cont colace    Family/ staff Communication: plan discussed with patient and nurse  Labs/tests ordered:  cbc/diff, cmp, A1c 11/08/2021

## 2021-11-08 DIAGNOSIS — E1122 Type 2 diabetes mellitus with diabetic chronic kidney disease: Secondary | ICD-10-CM | POA: Diagnosis not present

## 2021-11-08 DIAGNOSIS — M6281 Muscle weakness (generalized): Secondary | ICD-10-CM | POA: Diagnosis not present

## 2021-11-08 DIAGNOSIS — E782 Mixed hyperlipidemia: Secondary | ICD-10-CM | POA: Diagnosis not present

## 2021-11-08 DIAGNOSIS — N200 Calculus of kidney: Secondary | ICD-10-CM | POA: Diagnosis not present

## 2021-11-08 DIAGNOSIS — I10 Essential (primary) hypertension: Secondary | ICD-10-CM | POA: Diagnosis not present

## 2021-11-09 LAB — BASIC METABOLIC PANEL
BUN: 29 — AB (ref 4–21)
CO2: 29 — AB (ref 13–22)
Chloride: 104 (ref 99–108)
Creatinine: 1.1 (ref 0.5–1.1)
Glucose: 60
Potassium: 4.1 mEq/L (ref 3.5–5.1)
Sodium: 140 (ref 137–147)

## 2021-11-09 LAB — COMPREHENSIVE METABOLIC PANEL
Albumin: 4 (ref 3.5–5.0)
Calcium: 9.1 (ref 8.7–10.7)
Globulin: 2.4
eGFR: 45

## 2021-11-09 LAB — CBC AND DIFFERENTIAL
HCT: 41 (ref 36–46)
Hemoglobin: 13.1 (ref 12.0–16.0)
WBC: 6

## 2021-11-09 LAB — HEPATIC FUNCTION PANEL
ALT: 14 U/L (ref 7–35)
AST: 22 (ref 13–35)
Alkaline Phosphatase: 38 (ref 25–125)
Bilirubin, Total: 0.6

## 2021-11-09 LAB — CBC: RBC: 4.67 (ref 3.87–5.11)

## 2021-11-09 LAB — HEMOGLOBIN A1C: Hemoglobin A1C: 5.7

## 2021-12-07 DIAGNOSIS — L602 Onychogryphosis: Secondary | ICD-10-CM | POA: Diagnosis not present

## 2021-12-07 DIAGNOSIS — M79672 Pain in left foot: Secondary | ICD-10-CM | POA: Diagnosis not present

## 2021-12-07 DIAGNOSIS — L84 Corns and callosities: Secondary | ICD-10-CM | POA: Diagnosis not present

## 2021-12-07 DIAGNOSIS — M79671 Pain in right foot: Secondary | ICD-10-CM | POA: Diagnosis not present

## 2021-12-10 LAB — COMPREHENSIVE METABOLIC PANEL
Albumin: 3.8 (ref 3.5–5.0)
Calcium: 9.8 (ref 8.7–10.7)
Globulin: 2.5
eGFR: 61

## 2021-12-10 LAB — BASIC METABOLIC PANEL
BUN: 20 (ref 4–21)
CO2: 30 — AB (ref 13–22)
Chloride: 103 (ref 99–108)
Creatinine: 0.9 (ref 0.5–1.1)
Glucose: 97
Potassium: 4.3 mEq/L (ref 3.5–5.1)
Sodium: 139 (ref 137–147)

## 2021-12-10 LAB — HEPATIC FUNCTION PANEL
ALT: 9 U/L (ref 7–35)
AST: 15 (ref 13–35)
Alkaline Phosphatase: 60 (ref 25–125)
Bilirubin, Total: 0.3

## 2021-12-10 LAB — LIPID PANEL
Cholesterol: 157 (ref 0–200)
HDL: 46 (ref 35–70)
LDL Cholesterol: 90
LDl/HDL Ratio: 3.4
Triglycerides: 109 (ref 40–160)

## 2022-01-31 ENCOUNTER — Encounter: Payer: Self-pay | Admitting: Internal Medicine

## 2022-01-31 ENCOUNTER — Non-Acute Institutional Stay: Payer: Medicare Other | Admitting: Internal Medicine

## 2022-01-31 DIAGNOSIS — N1831 Chronic kidney disease, stage 3a: Secondary | ICD-10-CM | POA: Diagnosis not present

## 2022-01-31 DIAGNOSIS — I5032 Chronic diastolic (congestive) heart failure: Secondary | ICD-10-CM

## 2022-01-31 DIAGNOSIS — M159 Polyosteoarthritis, unspecified: Secondary | ICD-10-CM | POA: Diagnosis not present

## 2022-01-31 DIAGNOSIS — G609 Hereditary and idiopathic neuropathy, unspecified: Secondary | ICD-10-CM

## 2022-01-31 DIAGNOSIS — E1122 Type 2 diabetes mellitus with diabetic chronic kidney disease: Secondary | ICD-10-CM | POA: Diagnosis not present

## 2022-02-01 NOTE — Progress Notes (Signed)
Provider:   Location:  [place] Room Number: AL23/A Place of Service:  ALF (13)  PCP: [name], MD Patient Care Team: [name], MD as PCP - General (Internal Medicine) Rutherford Guys, MD as Consulting Physician (Ophthalmology) [name], MD as Consulting Physician (Internal Medicine)  Extended Emergency Contact Information Primary Emergency Contact: Formosa,Leonard Address: [address] [place] [address] Johnnette Litter of [place] Phone: [phone] Work Phone: [phone] Mobile Phone: [phone] Relation: Son Secondary Emergency Contact: Gerarda Fraction [place] of [place] Phone: [phone] Relation: Friend  Code Status: DNR Goals of Care: Advanced Directive information    01/31/2022    4:26 PM  Advanced Directives  Does Patient Have a Medical Advance Directive? Yes  Type of Paramedic of [place];Living will;Out of facility DNR (pink MOST or yellow form)  Does patient want to make changes to medical advance directive? No - Patient declined  Copy of [place] in Chart? Yes - validated most recent copy scanned in chart (See row information)  Pre-existing out of facility DNR order (yellow form or pink MOST form) Pink Most/Yellow Form available - Physician notified to receive inpatient order;Yellow form placed in chart (order not valid for inpatient use)      Chief Complaint  Patient presents with   Medical Management of Chronic Issues    Routine     HPI: Patient is a 88 y.o. female seen today for Chronic Disease management  Patient has h/o Hypertension, Diabetes Mellitus, H/o CKD,, Low back Pain and Arthritis   No New Complains She is stable. No new Nursing issues. No Behavior issues Her weight is stable Walks with her walker No Falls Wt Readings from Last 3 Encounters:  01/31/22 156 lb 9.6 oz (71 kg)  11/05/21 156 lb 3.2 oz (70.9 kg)   07/26/21 161 lb 12.8 oz (73.4 kg)    Past Medical History:  Diagnosis Date   Acute blood loss anemia    Allergy    Asthma    AVM (arteriovenous malformation) of colon with hemorrhage    Bursitis of right hip    Cataract    CKD (chronic kidney disease), stage III ([place]) 11/04/2015   COLONIC POLYPS 03/26/2005   Qualifier: Diagnosis of  By: Talbert Cage CMA (AAMA), June     DIVERTICULOSIS, COLON 03/26/2005   Qualifier: Diagnosis of  By: Talbert Cage CMA Deborra Medina), June     DVT (deep venous thrombosis), left 11/03/2015   Ecchymosis 12/16/2015   Hyperglycemia 12/16/2015   Hyperlipidemia    Hypertension    Multiple skin tears 12/16/2015   Osteopenia    Stasis edema of both lower extremities 11/04/2015   Unstable gait 12/16/2015   Urine incontinence 12/16/2015   Past Surgical History:  Procedure Laterality Date   ABDOMINAL HYSTERECTOMY  2006   Dr. Maryelizabeth Rowan   CATARACT EXTRACTION W/ INTRAOCULAR LENS IMPLANT Bilateral 1999/2003   Dr. Rutherford Guys   COLONOSCOPY N/A 11/06/2015   Procedure: COLONOSCOPY;  Surgeon: Mauri Pole, MD;  Location: [facility];  Service: Endoscopy;  Laterality: N/A;  MAC if available, otherwise moderate sedation    reports that she has never smoked. She has never used smokeless tobacco. She reports that she does not drink alcohol and does not use drugs. Social History   Socioeconomic History   Marital status: Widowed    Spouse name: Not on file   Number of children:  2   Years of education: Not on file   Highest education level: Not on file  Occupational History   Occupation: retired Art gallery manager  Tobacco Use   Smoking status: Never   Smokeless tobacco: Never  Vaping Use   Vaping Use: Never used  Substance and Sexual Activity   Alcohol use: No    Alcohol/week: 0.0 standard drinks of alcohol   Drug use: No   Sexual activity: Never    Comment: was until a few years ago  Other Topics Concern   Not on file  Social History Narrative   Lives alone in an  retirement community. Moved to [place]/08/15   Walks with a walker.   NOK: 2 sons with shared POA (they live far away) - Shanon Brow Dintinfass would be first call.   Never smoked   Alcohol none   Exercise none   POA, Living Will, MOST             Social Determinants of Health   Financial Resource Strain: Low Risk  (07/02/2021)   Overall Financial Resource Strain (CARDIA)    Difficulty of Paying Living Expenses: Not hard at all  Food Insecurity: No Food Insecurity (07/02/2021)   Hunger Vital Sign    Worried About Running Out of Food in the Last Year: Never true    Ran Out of Food in the Last Year: Never true  Transportation Needs: No Transportation Needs (07/02/2021)   PRAPARE - Hydrologist (Medical): No    Lack of Transportation (Non-Medical): No  Physical Activity: Sufficiently Active (07/02/2021)   Exercise Vital Sign    Days of Exercise per Week: 7 days    Minutes of Exercise per Session: 30 min  Stress: No Stress Concern Present (07/02/2021)   [place]    Feeling of Stress : Not at all  Social Connections: Socially Isolated (07/02/2021)   Social Connection and Isolation Panel [NHANES]    Frequency of Communication with Friends and Family: More than three times a week    Frequency of Social Gatherings with Friends and Family: More than three times a week    Attends Religious Services: Never    Marine scientist or Organizations: No    Attends Archivist Meetings: Never    Marital Status: Widowed  Intimate Partner Violence: Not At Risk (07/02/2021)   Humiliation, Afraid, Rape, and Kick questionnaire    Fear of Current or Ex-Partner: No    Emotionally Abused: No    Physically Abused: No    Sexually Abused: No    Functional Status Survey:    Family History  Problem Relation Age of Onset   Heart attack Father 38    Health Maintenance  Topic Date Due   Zoster  Vaccines- Shingrix (1 of 2) Never done   INFLUENZA VACCINE  12/11/2021   COVID-19 Vaccine (5 - Moderna series) 03/26/2022 (Originally 03/28/2021)   OPHTHALMOLOGY EXAM  02/28/2022   FOOT EXAM  04/26/2022   HEMOGLOBIN A1C  05/11/2022   TETANUS/TDAP  01/05/2027   Pneumonia Vaccine 59+ Years old  Completed   DEXA SCAN  Completed   HPV VACCINES  Aged Out    Allergies  Allergen Reactions   Epinephrine Nausea Only    Unknown.   Hydrocodone-Acetaminophen     REACTION: nausea   Labetalol    Procaine    Vitamin D3 [Cholecalciferol]     Outpatient Encounter Medications as  of 01/31/2022  Medication Sig   acetaminophen (TYLENOL) 500 MG tablet Take 500 mg by mouth 2 (two) times daily.    calcium carbonate (TUMS - DOSED IN MG ELEMENTAL CALCIUM) 500 MG chewable tablet Chew 2 tablets by mouth every morning.    cholecalciferol (VITAMIN D3) 25 MCG (1000 UNIT) tablet Take 1,000 Units by mouth daily.   docusate sodium (COLACE) 100 MG capsule Take 100 mg by mouth every morning.   furosemide (LASIX) 20 MG tablet Take 1 tablet (20 mg total) by mouth daily.   gabapentin (NEURONTIN) 300 MG capsule Take 300 mg by mouth 2 (two) times daily.   HYDROcodone-acetaminophen (NORCO/VICODIN) 5-325 MG tablet Take 1 tablet by mouth every 6 (six) hours as needed for moderate pain. Twice a day 8:00 am,8:00 pm   hydrocortisone 2.5 % cream Apply topically as needed.   loperamide (IMODIUM A-D) 2 MG tablet Take 4 mg by mouth as needed for diarrhea or loose stools. If 3 loose stools in 24 hours, hold all laxatives and stool softeners. Give Imodium AD (loperamide) 4 mg po initial dose, then 2 mg po after each loose stool X 48 hrs.(maximum of 45m in 24 hrs. If diarrhea continues, notify MD   Multiple Vitamins-Minerals (ICAPS MV PO) Take 1 tablet by mouth daily.   Multiple Vitamins-Minerals (MULTIVITAMIN WITH MINERALS) tablet Take 1 tablet by mouth daily.    nystatin (MYCOSTATIN/NYSTOP) powder Apply to groin 2 times daily as  needed   Sodium Fluoride 1.1 % PSTE Place onto teeth daily.   [DISCONTINUED] cholecalciferol (VITAMIN D) 1000 units tablet Take 1,000 Units by mouth daily.   [DISCONTINUED] HYDROcodone-acetaminophen (NORCO) 10-325 MG tablet Take 1 tablet by mouth 2 (two) times daily.   No facility-administered encounter medications on file as of 01/31/2022.    Review of Systems  Constitutional:  Negative for activity change and appetite change.  HENT: Negative.    Respiratory:  Negative for cough and shortness of breath.   Cardiovascular:  Negative for leg swelling.  Gastrointestinal:  Negative for constipation.  Genitourinary: Negative.   Musculoskeletal:  Negative for arthralgias, gait problem and myalgias.  Skin: Negative.   Neurological:  Negative for dizziness and weakness.  Psychiatric/Behavioral:  Negative for confusion, dysphoric mood and sleep disturbance.     Vitals:   01/31/22 1616  BP: (!) 103/57  Pulse: 63  Resp: 16  Temp: 97.9 F (36.6 C)  SpO2: 95%  Weight: 156 lb 9.6 oz (71 kg)  Height: _0  (1.6 m)   Body mass index is 27.74 kg/m. Physical Exam Vitals reviewed.  Constitutional:      Appearance: Normal appearance.  HENT:     Head: Normocephalic.     Nose: Nose normal.     Mouth/Throat:     Mouth: Mucous membranes are moist.     Pharynx: Oropharynx is clear.  Eyes:     Pupils: Pupils are equal, round, and reactive to light.  Cardiovascular:     Rate and Rhythm: Normal rate and regular rhythm.     Pulses: Normal pulses.     Heart sounds: Normal heart sounds. No murmur heard. Pulmonary:     Effort: Pulmonary effort is normal.     Breath sounds: Normal breath sounds.  Abdominal:     General: Abdomen is flat. Bowel sounds are normal.     Palpations: Abdomen is soft.  Musculoskeletal:        General: Swelling present.     Cervical back: Neck supple.  Skin:  General: Skin is warm.  Neurological:     General: No focal deficit present.     Mental Status: She is  alert and oriented to person, place, and time.  Psychiatric:        Mood and Affect: Mood normal.        Thought Content: Thought content normal.     Labs reviewed: Basic Metabolic Panel: Recent Labs    05/03/21 0000 05/04/21 0000 11/09/21 0000 12/10/21 0000  NA 141  --  140 139  K 4.2  --  4.1 4.3  CL 102  --  104 103  CO2 30*  --  29* 30*  BUN 29*  --  29* 20  CREATININE 1.2* 1.2* 1.1 0.9  CALCIUM 9.4 9.4 9.1 9.8   Liver Function Tests: Recent Labs    05/03/21 0000 11/09/21 0000 12/10/21 0000  AST _0 ALT _1 ALKPHOS 47 38 60  ALBUMIN 4.2 4.0 3.8   No results for input(s): "LIPASE", "AMYLASE" in the last 8760 hours. No results for input(s): "AMMONIA" in the last 8760 hours. CBC: Recent Labs    05/03/21 0000 11/09/21 0000  WBC 5.6 6.0  NEUTROABS 2,682.00  --   HGB 13.2 13.1  HCT 41 41  PLT 205  --    Cardiac Enzymes: No results for input(s): "CKTOTAL", "CKMB", "CKMBINDEX", "TROPONINI" in the last 8760 hours. BNP: Invalid input(s): "POCBNP" Lab Results  Component Value Date   HGBA1C 5.7 11/09/2021   Lab Results  Component Value Date   TSH 2.25 05/03/2021   No results found for: "VITAMINB12" No results found for: "FOLATE" No results found for: "IRON", "TIBC", "FERRITIN"  Imaging and Procedures obtained prior to SNF admission: VAS [place] LOWER EXTREMITY VENOUS (DVT)  Result Date: 11/05/2015                     *[facility] [employer].                        [place], [place] [address]                            [phone] ------------------------------------------------------------------- Noninvasive Vascular Lab Right Lower Extremity Venous Duplex Evaluation Patient:    Shlonda, Dolloff MR #:       [id number] Study Date: 11/04/2015 Gender:     F Age:        59 Height: Weight: BSA: Pt. Status: Room:       1618  ADMITTING    Zachery Dauer  ATTENDING    Alondra [place], [place]  SONOGRAPHER  Janifer Adie, RVT, RDMS Reports also to: ------------------------------------------------------------------- History and indications: History Diagnostic evaluation. ------------------------------------------------------------------- Study information: Study status:  Routine.  Procedure:  A vascular evaluation was performed with the patient in the supine position. The right common femoral, right femoral, right greater saphenous, right lesser saphenous, right profunda femoral, right popliteal, right peroneal, right posterior tibial, and left common femoral veins were studied. Image quality was good.  Right lower extremity venous duplex evaluation.     Doppler flow study including B-mode compression maneuvers of all visualized segments, color flow Doppler and selected views of pulsed wave Doppler.  Birthdate:  Patient birthdate: 08/18/1925.  Age:  Patient is 88 yr old.  Sex:  Gender: female.  Study date:  Study date: 11/04/2015. Study time: 08:55 AM.  Location:  Bedside.  Patient status:  Inpatient. Venous flow: +----------------+----------+----------------------+--------------+ Location        Overall   Flow properties       Thrombus       +----------------+----------+----------------------+--------------+ Right common    Patent    Phasic; spontaneous;  -------------- femoral                   compressible                         +----------------+----------+----------------------+--------------+ Right femoral   Patent    Compressible          -------------- +----------------+----------+----------------------+--------------+ Right profunda  Patent    Compressible          -------------- femoral                                                        +----------------+----------+----------------------+--------------+ Right popliteal ThrombosedNon phasic; not       Acute thrombus                            spontaneous;                                                   noncompressible                      +----------------+----------+----------------------+--------------+ Right posterior Patent    Compressible          -------------- tibial                                                         +----------------+----------+----------------------+--------------+ Right peroneal  ThrombosedNoncompressible       Acute thrombus +----------------+----------+----------------------+--------------+ Right           Patent    Compressible          -------------- saphenofemoral                                                 junction                                                       +----------------+----------+----------------------+--------------+ ------------------------------------------------------------------- Summary: - Findings consistent with acute deep vein thrombosis involving the  right popliteal vein and right peroneal vein. - . Incidental findings are consistent with: Baker&'s Cyst on the   right. Other specific details can be found in the table(s) above. Prepared and Electronically Authenticated by Adele Barthel, MD 2017-06-25T07:56:51  NM Pulmonary Perf and Vent  Result Date: 11/04/2015 CLINICAL DATA:  88 year old female with history of DVT. No chest pain or shortness of breath EXAM: NUCLEAR MEDICINE VENTILATION - PERFUSION LUNG SCAN TECHNIQUE: Ventilation images were obtained in multiple projections using inhaled aerosol Tc-65mDTPA. Perfusion images were obtained in multiple projections after intravenous injection of Tc-933mAA. RADIOPHARMACEUTICALS:  31.8 mCi Technetium-9924mPA aerosol inhalation and 4.1 mCi Technetium-75m34m IV COMPARISON:  Chest radiograph dated 11/03/2015 FINDINGS: Ventilation: No focal ventilation defect. Perfusion: No wedge shaped peripheral perfusion defects to suggest acute pulmonary embolism. IMPRESSION: Low probability  for pulmonary embolism. Electronically Signed   By: ArasAnner Crete.   On: 11/04/2015 01:30   US VKoreaous Img Lower Unilateral Left  Result Date: 11/03/2015 CLINICAL DATA:  Left lower extremity bruising and swelling for three days EXAM: LEFT LOWER EXTREMITY VENOUS DOPPLER ULTRASOUND TECHNIQUE: Gray-scale sonography with graded compression, as well as color Doppler and duplex ultrasound were performed to evaluate the lower extremity deep venous systems from the level of the common femoral vein and including the common femoral, femoral, profunda femoral, popliteal and calf veins including the posterior tibial, peroneal and gastrocnemius veins when visible. The superficial great saphenous vein was also interrogated. Spectral Doppler was utilized to evaluate flow at rest and with distal augmentation maneuvers in the common femoral, femoral and popliteal veins. COMPARISON:  None. FINDINGS: Contralateral Common Femoral Vein: Respiratory phasicity is normal and symmetric with the symptomatic side. No evidence of thrombus. Normal compressibility. Common Femoral Vein: Partially occlusive thrombus with decreased compressibility. Saphenofemoral Junction: Partially occlusive thrombus with decreased compressability. Profunda Femoral Vein: The vein is not compressible and is occluded by thrombus. Femoral Vein: The vein is not compressible and is occluded by thrombus. Popliteal Vein: Partially occlusive thrombus with decreased compressability. Calf Veins:  Not evaluated well due to significant calf edema. Other Findings:  Significant soft tissue edema. IMPRESSION: Thrombus through the left lower extremity deep venous system from common femoral vein through popliteal vein including profunda femoral vein. Electronically Signed   By: RaymSkipper Cliche.   On: 11/03/2015 16:41   DG Chest 2 View  Result Date: 11/03/2015 CLINICAL DATA:  Lower extremity edema ; history of hypertension and hyperlipidemia EXAM: CHEST  2 VIEW  COMPARISON:  PA and lateral chest x-ray of June 02, 2004. FINDINGS: The lungs remain hyperinflated. There is no focal infiltrate. There is no pleural effusion. The heart and pulmonary vascularity are normal. The mediastinum is normal in width. There is calcification in the wall of the aortic arch. There is a small hiatal hernia. There is multilevel degenerative disc disease and mild dextrocurvature of the thoracic spine. IMPRESSION: 1. COPD-reactive airway disease. There is no evidence of pneumonia nor CHF. 2. Aortic atherosclerosis. 3. Small hiatal hernia. Electronically Signed   By: David  JordMartinique.   On: 11/03/2015 12:58    Assessment/Plan 1. Osteoarthritis involving multiple joints on both sides of body Tylenol and Norco  2. Type 2 diabetes mellitus with chronic kidney disease, without long-term current use of insulin, unspecified CKD stage (HCC) A1C good with no meds  3. Chronic diastolic congestive heart failure (HCC) Low dose lasix  4. Stage 3a chronic kidney disease (HCC) Creat stable  5. Idiopathic peripheral neuropathy On  Neurontin    Family/ staff Communication:   Labs/tests ordered:

## 2022-02-28 DIAGNOSIS — H5213 Myopia, bilateral: Secondary | ICD-10-CM | POA: Diagnosis not present

## 2022-02-28 DIAGNOSIS — E119 Type 2 diabetes mellitus without complications: Secondary | ICD-10-CM | POA: Diagnosis not present

## 2022-02-28 DIAGNOSIS — H52203 Unspecified astigmatism, bilateral: Secondary | ICD-10-CM | POA: Diagnosis not present

## 2022-02-28 DIAGNOSIS — H524 Presbyopia: Secondary | ICD-10-CM | POA: Diagnosis not present

## 2022-02-28 DIAGNOSIS — H353131 Nonexudative age-related macular degeneration, bilateral, early dry stage: Secondary | ICD-10-CM | POA: Diagnosis not present

## 2022-02-28 DIAGNOSIS — Z961 Presence of intraocular lens: Secondary | ICD-10-CM | POA: Diagnosis not present

## 2022-03-19 DIAGNOSIS — R2681 Unsteadiness on feet: Secondary | ICD-10-CM | POA: Diagnosis not present

## 2022-03-19 DIAGNOSIS — M6281 Muscle weakness (generalized): Secondary | ICD-10-CM | POA: Diagnosis not present

## 2022-03-21 DIAGNOSIS — M6281 Muscle weakness (generalized): Secondary | ICD-10-CM | POA: Diagnosis not present

## 2022-03-21 DIAGNOSIS — R2681 Unsteadiness on feet: Secondary | ICD-10-CM | POA: Diagnosis not present

## 2022-03-26 DIAGNOSIS — R2681 Unsteadiness on feet: Secondary | ICD-10-CM | POA: Diagnosis not present

## 2022-03-26 DIAGNOSIS — M6281 Muscle weakness (generalized): Secondary | ICD-10-CM | POA: Diagnosis not present

## 2022-03-28 DIAGNOSIS — M6281 Muscle weakness (generalized): Secondary | ICD-10-CM | POA: Diagnosis not present

## 2022-03-28 DIAGNOSIS — R2681 Unsteadiness on feet: Secondary | ICD-10-CM | POA: Diagnosis not present

## 2022-04-01 DIAGNOSIS — R2681 Unsteadiness on feet: Secondary | ICD-10-CM | POA: Diagnosis not present

## 2022-04-01 DIAGNOSIS — M6281 Muscle weakness (generalized): Secondary | ICD-10-CM | POA: Diagnosis not present

## 2022-04-03 DIAGNOSIS — M6281 Muscle weakness (generalized): Secondary | ICD-10-CM | POA: Diagnosis not present

## 2022-04-03 DIAGNOSIS — R2681 Unsteadiness on feet: Secondary | ICD-10-CM | POA: Diagnosis not present

## 2022-04-08 DIAGNOSIS — M6281 Muscle weakness (generalized): Secondary | ICD-10-CM | POA: Diagnosis not present

## 2022-04-08 DIAGNOSIS — R2681 Unsteadiness on feet: Secondary | ICD-10-CM | POA: Diagnosis not present

## 2022-04-10 DIAGNOSIS — M6281 Muscle weakness (generalized): Secondary | ICD-10-CM | POA: Diagnosis not present

## 2022-04-10 DIAGNOSIS — R2681 Unsteadiness on feet: Secondary | ICD-10-CM | POA: Diagnosis not present

## 2022-04-16 DIAGNOSIS — M6281 Muscle weakness (generalized): Secondary | ICD-10-CM | POA: Diagnosis not present

## 2022-04-16 DIAGNOSIS — R2681 Unsteadiness on feet: Secondary | ICD-10-CM | POA: Diagnosis not present

## 2022-04-18 DIAGNOSIS — R2681 Unsteadiness on feet: Secondary | ICD-10-CM | POA: Diagnosis not present

## 2022-04-18 DIAGNOSIS — M6281 Muscle weakness (generalized): Secondary | ICD-10-CM | POA: Diagnosis not present

## 2022-04-24 ENCOUNTER — Encounter: Payer: Self-pay | Admitting: Orthopedic Surgery

## 2022-04-24 ENCOUNTER — Non-Acute Institutional Stay: Payer: Medicare Other | Admitting: Orthopedic Surgery

## 2022-04-24 DIAGNOSIS — E1122 Type 2 diabetes mellitus with diabetic chronic kidney disease: Secondary | ICD-10-CM | POA: Diagnosis not present

## 2022-04-24 DIAGNOSIS — R2681 Unsteadiness on feet: Secondary | ICD-10-CM | POA: Diagnosis not present

## 2022-04-24 DIAGNOSIS — N1831 Chronic kidney disease, stage 3a: Secondary | ICD-10-CM | POA: Diagnosis not present

## 2022-04-24 DIAGNOSIS — I5032 Chronic diastolic (congestive) heart failure: Secondary | ICD-10-CM

## 2022-04-24 DIAGNOSIS — M159 Polyosteoarthritis, unspecified: Secondary | ICD-10-CM

## 2022-04-24 DIAGNOSIS — G609 Hereditary and idiopathic neuropathy, unspecified: Secondary | ICD-10-CM

## 2022-04-24 DIAGNOSIS — M85852 Other specified disorders of bone density and structure, left thigh: Secondary | ICD-10-CM

## 2022-04-24 NOTE — Progress Notes (Signed)
Location:  [facility] Room Number: [place] of Service:  ALF [phone]) Provider:  Windell Moulding, NP   Patient Care Team: [name], MD as PCP - General (Internal Medicine) Rutherford Guys, MD as Consulting Physician (Ophthalmology) [name], MD as Consulting Physician (Internal Medicine)  Extended Emergency Contact Information Primary Emergency Contact: Cheatum,Leonard Address: [address] [place] [address] Johnnette Litter of [place] Phone: [phone] Work Phone: [phone] Mobile Phone: [phone] Relation: Son Secondary Emergency Contact: Gerarda Fraction [place] of [place] Phone: [phone] Relation: Friend  Code Status:  DNR Goals of care: Advanced Directive information    01/31/2022    4:26 PM  Advanced Directives  Does Patient Have a Medical Advance Directive? Yes  Type of Paramedic of [place];Living will;Out of facility DNR (pink MOST or yellow form)  Does patient want to make changes to medical advance directive? No - Patient declined  Copy of [place] in Chart? Yes - validated most recent copy scanned in chart (See row information)  Pre-existing out of facility DNR order (yellow form or pink MOST form) Pink Most/Yellow Form available - Physician notified to receive inpatient order;Yellow form placed in chart (order not valid for inpatient use)     Chief Complaint  Patient presents with   Medical Management of Chronic Issues    Routine Visit with provider on site at [facility]    Needs to Discuss  Shingrix Vaccine, COVID 19 Vaccine,& Annual Ophthalmology Exam     HPI:  Pt is a 88 y.o. female seen today for medical management of chronic diseases.    She currently resides on the assisted living unit at [facility]. PMH: CHF, PVD,  hx of DVT, diverticulosis, T2DM, OA spine, peripheral neuropathy, and CKD III.    T2DM- A1c 5.7 11/09/2021, diabetic eye exam 02/28/2022 with Dr. Gershon Crane, not on medications, remains on carb modified diet CHF- no recent weight fluctuations/sob/ankle edema, Echo LVEF 55-60% 2017, remains on furosemide daily CKD 3a- BUN/creat 20/0.9 12/10/2021 OA- involves lower back/hips/hands, right hip most bothersome lately> pain improves with movement/ denies pain to right groin, remains on norco  Unstable gait- ambulates well with walker, no recent falls Osteopenia- DEXA 2015, tscore -2.3, remains on calcium and vitamin D Neuropathy- denies increased pain/numbness, remains on gabapentin  Recent blood pressures:  12/08- 145/71  12/06- 107/54  11/29- 99/61  Recent weights:  12/01- 156.8 lbs  11/03- 154.8 lbs  10/04- 155 lbs  Past Surgical History:  Procedure Laterality Date   ABDOMINAL HYSTERECTOMY  2006   Dr. Maryelizabeth Rowan   CATARACT EXTRACTION W/ INTRAOCULAR LENS IMPLANT Bilateral 1999/2003   Dr. Rutherford Guys   COLONOSCOPY N/A 11/06/2015   Procedure: COLONOSCOPY;  Surgeon: Mauri Pole, MD;  Location: [facility];  Service: Endoscopy;  Laterality: N/A;  MAC if available, otherwise moderate sedation    Allergies  Allergen Reactions   Epinephrine Nausea Only    Unknown.   Hydrocodone-Acetaminophen     REACTION: nausea   Labetalol    Procaine    Vitamin D3 [Cholecalciferol]     Outpatient Encounter Medications as of 04/24/2022  Medication Sig   acetaminophen (TYLENOL) 500 MG tablet Take 500 mg by mouth 2 (two) times daily.    calcium carbonate (TUMS - DOSED IN MG ELEMENTAL CALCIUM) 500 MG chewable tablet Chew 2 tablets by mouth  every morning.    cholecalciferol (VITAMIN D3) 25 MCG (1000 UNIT) tablet Take 1,000 Units by mouth daily.   docusate sodium (COLACE) 100 MG capsule Take 100 mg by mouth every morning.   furosemide (LASIX) 20 MG tablet Take 1 tablet (20 mg total) by mouth daily.   gabapentin (NEURONTIN) 300 MG capsule Take 300 mg by mouth 2 (two) times  daily.   HYDROcodone-acetaminophen (NORCO/VICODIN) 5-325 MG tablet Take 1 tablet by mouth every 6 (six) hours as needed for moderate pain. Twice a day 8:00 am,8:00 pm   hydrocortisone 2.5 % cream Apply topically as needed.   loperamide (IMODIUM A-D) 2 MG tablet Take 4 mg by mouth as needed for diarrhea or loose stools. If 3 loose stools in 24 hours, hold all laxatives and stool softeners. Give Imodium AD (loperamide) 4 mg po initial dose, then 2 mg po after each loose stool X 48 hrs.(maximum of 65m in 24 hrs. If diarrhea continues, notify MD   Multiple Vitamins-Minerals (ICAPS MV PO) Take 1 tablet by mouth daily.   Multiple Vitamins-Minerals (MULTIVITAMIN WITH MINERALS) tablet Take 1 tablet by mouth daily.    nystatin (MYCOSTATIN/NYSTOP) powder Apply to groin 2 times daily as needed   Sodium Fluoride 1.1 % PSTE Place onto teeth daily.   zinc oxide 20 % ointment Apply 1 Application topically as needed for irritation (INCONTINENCE AND REDNESS.).   No facility-administered encounter medications on file as of 04/24/2022.    Review of Systems  Constitutional:  Negative for activity change, appetite change, fatigue and fever.  HENT:  Negative for congestion and trouble swallowing.   Eyes:  Negative for visual disturbance.  Respiratory:  Negative for cough, shortness of breath and wheezing.   Cardiovascular:  Negative for chest pain and leg swelling.  Gastrointestinal:  Negative for abdominal distention, abdominal pain, constipation, diarrhea, nausea and vomiting.  Genitourinary:  Negative for dysuria, frequency and hematuria.  Musculoskeletal:  Positive for arthralgias, back pain and gait problem.  Skin:  Negative for wound.  Neurological:  Positive for weakness. Negative for dizziness and headaches.  Psychiatric/Behavioral:  Negative for confusion, dysphoric mood and sleep disturbance. The patient is not nervous/anxious.     Immunization History  Administered Date(s) Administered   DTaP  05/13/2005   Fluad Quad(high Dose 65+) 03/06/2022   Influenza, High Dose Seasonal PF 02/23/2019, 02/15/2020   Influenza-Unspecified 02/11/2015, 02/22/2016, 02/19/2017, 02/16/2018, 03/06/2021   Moderna SARS-COV2 Booster Vaccination 10/18/2020   Moderna Sars-Covid-2 Vaccination 05/17/2019, 06/14/2019, 03/27/2020   PFIZER(Purple Top)SARS-COV-2 Vaccination 01/31/2021   PPD Test 12/08/2015, 12/22/2015   Pneumococcal Conjugate-13 05/13/2014   Pneumococcal Polysaccharide-23 05/13/2014, 03/17/2019   Td 12/11/2005   Tdap 01/04/2017   Zoster, Live 05/13/2014   Pertinent  Health Maintenance Due  Topic Date Due   OPHTHALMOLOGY EXAM  02/28/2022   FOOT EXAM  04/26/2022   HEMOGLOBIN A1C  05/11/2022   INFLUENZA VACCINE  Completed   DEXA SCAN  Completed      11/03/2015   10:28 AM 12/30/2016   12:38 PM 01/05/2018   11:59 AM 07/02/2021    3:54 PM 04/24/2022   11:06 AM  Fall Risk  Falls in the past year? No No No 0 0  Was there an injury with Fall?    0 0  Fall Risk Category Calculator    0 0  Fall Risk Category    Low Low  Patient Fall Risk Level    Low fall risk Low fall risk  Patient at Risk for Falls Due  to    History of fall(s);Impaired balance/gait;Impaired mobility No Fall Risks  Fall risk Follow up    Falls evaluation completed;Education provided Falls evaluation completed   Functional Status Survey:    Vitals:   04/24/22 1051  BP: (!) 145/71  Pulse: 67  Resp: 18  Temp: 98 F (36.7 C)  SpO2: 97%  Weight: 156 lb 8 oz (71 kg)  Height: _0  (1.6 m)   Body mass index is 27.72 kg/m. Physical Exam Vitals reviewed.  Constitutional:      General: She is not in acute distress. HENT:     Head: Normocephalic.     Right Ear: There is no impacted cerumen.     Left Ear: There is no impacted cerumen.     Nose: Nose normal.     Mouth/Throat:     Mouth: Mucous membranes are moist.  Eyes:     General:        Right eye: No discharge.        Left eye: No discharge.  Cardiovascular:      Rate and Rhythm: Normal rate and regular rhythm.     Pulses: Normal pulses.     Heart sounds: Normal heart sounds.  Pulmonary:     Effort: Pulmonary effort is normal. No respiratory distress.     Breath sounds: Normal breath sounds. No wheezing.  Abdominal:     General: Bowel sounds are normal. There is no distension.     Palpations: Abdomen is soft.     Tenderness: There is no abdominal tenderness.  Musculoskeletal:     Cervical back: Neck supple.     Left hip: No deformity, tenderness or crepitus. Normal range of motion. Normal strength.     Right lower leg: No edema.     Left lower leg: No edema.  Skin:    General: Skin is warm and dry.     Capillary Refill: Capillary refill takes less than 2 seconds.  Neurological:     General: No focal deficit present.     Mental Status: She is alert and oriented to person, place, and time.     Motor: Weakness present.     Gait: Gait abnormal.     Comments: walker  Psychiatric:        Mood and Affect: Mood normal.        Behavior: Behavior normal.     Labs reviewed: Recent Labs    05/03/21 0000 05/04/21 0000 11/09/21 0000 12/10/21 0000  NA 141  --  140 139  K 4.2  --  4.1 4.3  CL 102  --  104 103  CO2 30*  --  29* 30*  BUN 29*  --  29* 20  CREATININE 1.2* 1.2* 1.1 0.9  CALCIUM 9.4 9.4 9.1 9.8   Recent Labs    05/03/21 0000 11/09/21 0000 12/10/21 0000  AST _1 ALT _2 ALKPHOS 47 38 60  ALBUMIN 4.2 4.0 3.8   Recent Labs    05/03/21 0000 11/09/21 0000  WBC 5.6 6.0  NEUTROABS 2,682.00  --   HGB 13.2 13.1  HCT 41 41  PLT 205  --    Lab Results  Component Value Date   TSH 2.25 05/03/2021   Lab Results  Component Value Date   HGBA1C 5.7 11/09/2021   Lab Results  Component Value Date   CHOL 157 12/10/2021   HDL 46 12/10/2021   LDLCALC 90 12/10/2021   TRIG 109 12/10/2021  Significant Diagnostic Results in last 30 days:  No results found.  Assessment/Plan 1. Type 2 diabetes mellitus  with chronic kidney disease, without long-term current use of insulin, unspecified CKD stage (HCC) - A1c 5.7 (06/30) - no hypoglycemias - diabetic eye exam 02/28/2022- Dr. Gershon Crane - recheck A1c - not on medication  2. Chronic diastolic congestive heart failure (HCC) - compensated - cont furosemide  3. Stage 3a chronic kidney disease (HCC) -avoid nephrotoxic drugs like NSAIDS and dose adjust medications to be renally excreted - encourage hydration with water  - cmp  4. Osteoarthritis involving multiple joints on both sides of body - ongoing - having more right hip pain, improved with ambulation - cont tylenol, norco, gabapentin  5. Unstable gait - no recent falls - ambulates with walker  6. Osteopenia of neck of left femur - DEXA 2015 - cont calcium/vitamin D  7. Idiopathic peripheral neuropathy - cont gabapentin    Family/ staff Communication: plan discussed with patient and nurse  Labs/tests ordered:  cbc/diff, cmp, A1c 04/25/2022

## 2022-04-25 DIAGNOSIS — M7071 Other bursitis of hip, right hip: Secondary | ICD-10-CM | POA: Diagnosis not present

## 2022-04-25 DIAGNOSIS — I10 Essential (primary) hypertension: Secondary | ICD-10-CM | POA: Diagnosis not present

## 2022-04-25 DIAGNOSIS — I5032 Chronic diastolic (congestive) heart failure: Secondary | ICD-10-CM | POA: Diagnosis not present

## 2022-04-25 DIAGNOSIS — E1122 Type 2 diabetes mellitus with diabetic chronic kidney disease: Secondary | ICD-10-CM | POA: Diagnosis not present

## 2022-04-25 DIAGNOSIS — N1832 Chronic kidney disease, stage 3b: Secondary | ICD-10-CM | POA: Diagnosis not present

## 2022-04-25 DIAGNOSIS — M6281 Muscle weakness (generalized): Secondary | ICD-10-CM | POA: Diagnosis not present

## 2022-04-25 DIAGNOSIS — E782 Mixed hyperlipidemia: Secondary | ICD-10-CM | POA: Diagnosis not present

## 2022-04-25 DIAGNOSIS — R262 Difficulty in walking, not elsewhere classified: Secondary | ICD-10-CM | POA: Diagnosis not present

## 2022-04-25 DIAGNOSIS — N200 Calculus of kidney: Secondary | ICD-10-CM | POA: Diagnosis not present

## 2022-04-25 DIAGNOSIS — Z9849 Cataract extraction status, unspecified eye: Secondary | ICD-10-CM | POA: Diagnosis not present

## 2022-04-25 LAB — CBC AND DIFFERENTIAL
HCT: 40 (ref 36–46)
Hemoglobin: 12.9 (ref 12.0–16.0)
Neutrophils Absolute: 4320
Platelets: 238 10*3/uL (ref 150–400)
WBC: 7.5

## 2022-04-25 LAB — HEPATIC FUNCTION PANEL
ALT: 12 U/L (ref 7–35)
AST: 24 (ref 13–35)
Alkaline Phosphatase: 41 (ref 25–125)
Bilirubin, Total: 0.6

## 2022-04-25 LAB — COMPREHENSIVE METABOLIC PANEL
Albumin: 4 (ref 3.5–5.0)
Calcium: 9.2 (ref 8.7–10.7)
Globulin: 2.8
eGFR: 37

## 2022-04-25 LAB — HEMOGLOBIN A1C: Hemoglobin A1C: 6.2

## 2022-04-25 LAB — BASIC METABOLIC PANEL
BUN: 32 — AB (ref 4–21)
CO2: 28 — AB (ref 13–22)
Chloride: 103 (ref 99–108)
Creatinine: 1.3 — AB (ref 0.5–1.1)
Glucose: 82
Potassium: 4.3 mEq/L (ref 3.5–5.1)
Sodium: 142 (ref 137–147)

## 2022-04-25 LAB — CBC: RBC: 4.54 (ref 3.87–5.11)

## 2022-04-26 DIAGNOSIS — N200 Calculus of kidney: Secondary | ICD-10-CM | POA: Diagnosis not present

## 2022-04-26 DIAGNOSIS — R262 Difficulty in walking, not elsewhere classified: Secondary | ICD-10-CM | POA: Diagnosis not present

## 2022-04-26 DIAGNOSIS — Z9849 Cataract extraction status, unspecified eye: Secondary | ICD-10-CM | POA: Diagnosis not present

## 2022-04-26 DIAGNOSIS — M6281 Muscle weakness (generalized): Secondary | ICD-10-CM | POA: Diagnosis not present

## 2022-04-26 DIAGNOSIS — E782 Mixed hyperlipidemia: Secondary | ICD-10-CM | POA: Diagnosis not present

## 2022-04-26 DIAGNOSIS — M7071 Other bursitis of hip, right hip: Secondary | ICD-10-CM | POA: Diagnosis not present

## 2022-04-26 DIAGNOSIS — I10 Essential (primary) hypertension: Secondary | ICD-10-CM | POA: Diagnosis not present

## 2022-04-29 DIAGNOSIS — M6281 Muscle weakness (generalized): Secondary | ICD-10-CM | POA: Diagnosis not present

## 2022-04-29 DIAGNOSIS — M7071 Other bursitis of hip, right hip: Secondary | ICD-10-CM | POA: Diagnosis not present

## 2022-04-29 DIAGNOSIS — E782 Mixed hyperlipidemia: Secondary | ICD-10-CM | POA: Diagnosis not present

## 2022-04-29 DIAGNOSIS — R262 Difficulty in walking, not elsewhere classified: Secondary | ICD-10-CM | POA: Diagnosis not present

## 2022-04-29 DIAGNOSIS — I10 Essential (primary) hypertension: Secondary | ICD-10-CM | POA: Diagnosis not present

## 2022-04-30 ENCOUNTER — Encounter: Payer: Self-pay | Admitting: Adult Health

## 2022-04-30 ENCOUNTER — Non-Acute Institutional Stay: Payer: Medicare Other | Admitting: Adult Health

## 2022-04-30 DIAGNOSIS — M6281 Muscle weakness (generalized): Secondary | ICD-10-CM | POA: Diagnosis not present

## 2022-04-30 DIAGNOSIS — E782 Mixed hyperlipidemia: Secondary | ICD-10-CM | POA: Diagnosis not present

## 2022-04-30 DIAGNOSIS — I5032 Chronic diastolic (congestive) heart failure: Secondary | ICD-10-CM

## 2022-04-30 DIAGNOSIS — I10 Essential (primary) hypertension: Secondary | ICD-10-CM | POA: Diagnosis not present

## 2022-04-30 DIAGNOSIS — R262 Difficulty in walking, not elsewhere classified: Secondary | ICD-10-CM | POA: Diagnosis not present

## 2022-04-30 DIAGNOSIS — M7071 Other bursitis of hip, right hip: Secondary | ICD-10-CM | POA: Diagnosis not present

## 2022-04-30 DIAGNOSIS — N1832 Chronic kidney disease, stage 3b: Secondary | ICD-10-CM

## 2022-04-30 DIAGNOSIS — R300 Dysuria: Secondary | ICD-10-CM | POA: Diagnosis not present

## 2022-04-30 NOTE — Progress Notes (Signed)
Location:  [facility] Room Number: 23-A Place of Service:  ALF [phone]) Provider:  Durenda Age, DNP, FNP-BC  Patient Care Team: [name], MD as PCP - General (Internal Medicine) Rutherford Guys, MD as Consulting Physician (Ophthalmology) [name], MD as Consulting Physician (Internal Medicine)  Extended Emergency Contact Information Primary Emergency Contact: Taketa,Leonard Address: [address] [place] [address] Johnnette Litter of [place] Phone: [phone] Work Phone: [phone] Mobile Phone: [phone] Relation: Son Secondary Emergency Contact: Gerarda Fraction [place] of [place] Phone: [phone] Relation: Friend  Code Status:  DNR  Goals of care: Advanced Directive information    04/30/2022   10:21 AM  Advanced Directives  Does Patient Have a Medical Advance Directive? Yes  Type of Paramedic of [place];Living will;Out of facility DNR (pink MOST or yellow form)  Does patient want to make changes to medical advance directive? No - Patient declined  Copy of [place] in Chart? Yes - validated most recent copy scanned in chart (See row information)  Pre-existing out of facility DNR order (yellow form or pink MOST form) Pink MOST/Yellow Form most recent copy in chart - Physician notified to receive inpatient order     Chief Complaint  Patient presents with   Acute Visit    Confusion    HPI:  Pt is a 88 y.o. female seen today for an acute visit regarding a reported confusion. She was seen in the room today. She is alert and oriented X 3. No noted confusion. Lab done yesterday showed  GFR 37, which is down from  GFR 61 taken 12/10/21. She takes Lasix Furosemide 20 mg daily for chronic diastolic CHF. No noted shortness of breath. She denies having dysuria, hematuria, urinary frequency/urgency, chills nor fever.   Past Medical History:   Diagnosis Date   Acute blood loss anemia    Allergy    Asthma    AVM (arteriovenous malformation) of colon with hemorrhage    Bursitis of right hip    Cataract    CKD (chronic kidney disease), stage III ([place]) 11/04/2015   COLONIC POLYPS 03/26/2005   Qualifier: Diagnosis of  By: Talbert Cage CMA (AAMA), June     DIVERTICULOSIS, COLON 03/26/2005   Qualifier: Diagnosis of  By: Talbert Cage CMA Deborra Medina), June     DVT (deep venous thrombosis), left 11/03/2015   Ecchymosis 12/16/2015   Hyperglycemia 12/16/2015   Hyperlipidemia    Hypertension    Multiple skin tears 12/16/2015   Osteopenia    Stasis edema of both lower extremities 11/04/2015   Unstable gait 12/16/2015   Urine incontinence 12/16/2015   Past Surgical History:  Procedure Laterality Date   ABDOMINAL HYSTERECTOMY  2006   Dr. Maryelizabeth Rowan   CATARACT EXTRACTION W/ INTRAOCULAR LENS IMPLANT Bilateral 1999/2003   Dr. Rutherford Guys   COLONOSCOPY N/A 11/06/2015   Procedure: COLONOSCOPY;  Surgeon: Mauri Pole, MD;  Location: [facility];  Service: Endoscopy;  Laterality: N/A;  MAC if available, otherwise moderate sedation    Allergies  Allergen Reactions   Epinephrine Nausea Only    Unknown.   Hydrocodone-Acetaminophen     REACTION: nausea   Labetalol    Procaine    Vitamin D3 [Cholecalciferol]     Outpatient Encounter Medications as of 04/30/2022  Medication Sig   acetaminophen (TYLENOL) 500 MG tablet Take 500 mg by mouth 2 (two) times daily.  calcium carbonate (TUMS - DOSED IN MG ELEMENTAL CALCIUM) 500 MG chewable tablet Chew 2 tablets by mouth every morning.    cholecalciferol (VITAMIN D3) 25 MCG (1000 UNIT) tablet Take 1,000 Units by mouth daily.   docusate sodium (COLACE) 100 MG capsule Take 100 mg by mouth every morning.   furosemide (LASIX) 20 MG tablet Take 1 tablet (20 mg total) by mouth daily.   gabapentin (NEURONTIN) 300 MG capsule Take 300 mg by mouth 2 (two) times daily.   HYDROcodone-acetaminophen (NORCO/VICODIN) 5-325  MG tablet Take 1 tablet by mouth every 6 (six) hours as needed for moderate pain. Twice a day 8:00 am,8:00 pm   hydrocortisone (PROCTO-MED HC) 2.5 % rectal cream Place 1 Application rectally as needed for hemorrhoids or anal itching. for external rectal hemorrhoids. unsupervised self-administration May self administer   Multiple Vitamins-Minerals (PRESERVISION AREDS) CAPS Take 1 capsule by mouth in the morning.   nystatin (MYCOSTATIN/NYSTOP) powder Apply to groin 2 times daily as needed   Sodium Fluoride 1.1 % PSTE Place onto teeth daily.   zinc oxide 20 % ointment Apply 1 Application topically as needed for irritation (INCONTINENCE AND REDNESS.).   loperamide (IMODIUM A-D) 2 MG tablet Take 4 mg by mouth as needed for diarrhea or loose stools. If 3 loose stools in 24 hours, hold all laxatives and stool softeners. Give Imodium AD (loperamide) 4 mg po initial dose, then 2 mg po after each loose stool X 48 hrs.(maximum of 67m in 24 hrs. If diarrhea continues, notify MD (Patient not taking: Reported on 04/30/2022)   [DISCONTINUED] hydrocortisone 2.5 % cream Apply topically as needed.   [DISCONTINUED] Multiple Vitamins-Minerals (ICAPS MV PO) Take 1 tablet by mouth daily.   [DISCONTINUED] Multiple Vitamins-Minerals (MULTIVITAMIN WITH MINERALS) tablet Take 1 tablet by mouth daily.    No facility-administered encounter medications on file as of 04/30/2022.    Review of Systems  Constitutional:  Negative for appetite change, chills, fatigue and fever.  HENT:  Negative for congestion, hearing loss, rhinorrhea and sore throat.   Eyes: Negative.   Respiratory:  Negative for cough, shortness of breath and wheezing.   Cardiovascular:  Negative for chest pain, palpitations and leg swelling.  Gastrointestinal:  Negative for abdominal pain, constipation, diarrhea, nausea and vomiting.  Genitourinary:  Negative for dysuria.  Musculoskeletal:  Negative for arthralgias, back pain and myalgias.  Skin:  Negative  for color change, rash and wound.  Neurological:  Negative for dizziness, weakness and headaches.  Psychiatric/Behavioral:  Negative for behavioral problems. The patient is not nervous/anxious.      Immunization History  Administered Date(s) Administered   DTaP 05/13/2005   Fluad Quad(high Dose 65+) 03/06/2022   Influenza, High Dose Seasonal PF 02/23/2019, 02/15/2020   Influenza-Unspecified 02/11/2015, 02/22/2016, 02/19/2017, 02/16/2018, 03/06/2021   Moderna SARS-COV2 Booster Vaccination 10/18/2020   Moderna Sars-Covid-2 Vaccination 05/17/2019, 06/14/2019, 03/27/2020   PFIZER(Purple Top)SARS-COV-2 Vaccination 01/31/2021, 03/19/2022   PPD Test 12/08/2015, 12/22/2015   Pneumococcal Conjugate-13 05/13/2014   Pneumococcal Polysaccharide-23 05/13/2014, 03/17/2019   Td 12/11/2005   Tdap 01/04/2017   Zoster, Live 05/13/2014   Pertinent  Health Maintenance Due  Topic Date Due   FOOT EXAM  04/26/2022   HEMOGLOBIN A1C  10/25/2022   OPHTHALMOLOGY EXAM  03/01/2023   INFLUENZA VACCINE  Completed   DEXA SCAN  Completed      11/03/2015   10:28 AM 12/30/2016   12:38 PM 01/05/2018   11:59 AM 07/02/2021    3:54 PM 04/24/2022   11:06 AM  Fall Risk  Falls in the past year? No No No 0 0  Was there an injury with Fall?    0 0  Fall Risk Category Calculator    0 0  Fall Risk Category    Low Low  Patient Fall Risk Level    Low fall risk Low fall risk  Patient at Risk for Falls Due to    History of fall(s);Impaired balance/gait;Impaired mobility No Fall Risks  Fall risk Follow up    Falls evaluation completed;Education provided Falls evaluation completed     Vitals:   04/30/22 1003  BP: (!) 122/55  Pulse: 70  Resp: 20  Temp: 98.2 F (36.8 C)  SpO2: 91%  Weight: 156 lb 12.8 oz (71.1 kg)  Height: _0  (1.6 m)   Body mass index is 27.78 kg/m.  Physical Exam Constitutional:      Appearance: Normal appearance.  HENT:     Head: Normocephalic and atraumatic.     Nose: Nose normal.      Mouth/Throat:     Mouth: Mucous membranes are moist.  Eyes:     Conjunctiva/sclera: Conjunctivae normal.  Cardiovascular:     Rate and Rhythm: Normal rate and regular rhythm.  Pulmonary:     Effort: Pulmonary effort is normal.     Breath sounds: Normal breath sounds.  Abdominal:     General: Bowel sounds are normal.     Palpations: Abdomen is soft.  Musculoskeletal:        General: Normal range of motion.     Cervical back: Normal range of motion.     Right lower leg: Edema present.     Left lower leg: Edema present.     Comments: Trace edema on BLE  Skin:    General: Skin is warm and dry.  Neurological:     General: No focal deficit present.     Mental Status: She is alert and oriented to person, place, and time.  Psychiatric:        Mood and Affect: Mood normal.        Behavior: Behavior normal.        Thought Content: Thought content normal.        Judgment: Judgment normal.        Labs reviewed: Recent Labs    11/09/21 0000 12/10/21 0000 04/25/22 0000  NA 140 139 142  K 4.1 4.3 4.3  CL 104 103 103  CO2 29* 30* 28*  BUN 29* 20 32*  CREATININE 1.1 0.9 1.3*  CALCIUM 9.1 9.8 9.2   Recent Labs    11/09/21 0000 12/10/21 0000 04/25/22 0000  AST _1 ALT _2 ALKPHOS 38 60 41  ALBUMIN 4.0 3.8 4.0   Recent Labs    05/03/21 0000 11/09/21 0000 04/25/22 0000  WBC 5.6 6.0 7.5  NEUTROABS 2,682.00  --  4,320.00  HGB 13.2 13.1 12.9  HCT 41 41 40  PLT 205  --  238   Lab Results  Component Value Date   TSH 2.25 05/03/2021   Lab Results  Component Value Date   HGBA1C 6.2 04/25/2022   Lab Results  Component Value Date   CHOL 157 12/10/2021   HDL 46 12/10/2021   LDLCALC 90 12/10/2021   TRIG 109 12/10/2021    Significant Diagnostic Results in last 30 days:  No results found.  Assessment/Plan  1. Stage 3b chronic kidney disease ([place]) Lab Results  Component Value Date   NA  142 04/25/2022   K 4.3 04/25/2022   CO2 28 (A) 04/25/2022    GLUCOSE 241 (H) 11/06/2015   BUN 32 (A) 04/25/2022   CREATININE 1.3 (A) 04/25/2022   CALCIUM 9.2 04/25/2022   EGFR 37 04/25/2022   GFRNONAA 35 09/29/2017   -  stable  2. Chronic diastolic congestive heart failure (HCC) - stable, no shortness of breath -  continue Lasix     Family/ staff Communication: Discussed plan of care with resident and charge nurse.  Labs/tests ordered: urinalysis with culture and sensitivity    Durenda Age, DNP, MSN, FNP-BC [place] [phone] (Monday-Friday 8:00 a.m. - 5:00 p.m.) [phone] (after hours)

## 2022-05-02 ENCOUNTER — Encounter: Payer: Self-pay | Admitting: Internal Medicine

## 2022-05-02 ENCOUNTER — Non-Acute Institutional Stay: Payer: Medicare Other | Admitting: Internal Medicine

## 2022-05-02 DIAGNOSIS — N1832 Chronic kidney disease, stage 3b: Secondary | ICD-10-CM

## 2022-05-02 DIAGNOSIS — G3184 Mild cognitive impairment, so stated: Secondary | ICD-10-CM | POA: Diagnosis not present

## 2022-05-02 DIAGNOSIS — E1122 Type 2 diabetes mellitus with diabetic chronic kidney disease: Secondary | ICD-10-CM | POA: Diagnosis not present

## 2022-05-02 DIAGNOSIS — I5032 Chronic diastolic (congestive) heart failure: Secondary | ICD-10-CM | POA: Diagnosis not present

## 2022-05-02 DIAGNOSIS — M159 Polyosteoarthritis, unspecified: Secondary | ICD-10-CM

## 2022-05-02 DIAGNOSIS — G609 Hereditary and idiopathic neuropathy, unspecified: Secondary | ICD-10-CM

## 2022-05-02 NOTE — Progress Notes (Signed)
Location:   [facility] Room Number: [place] of Service:  [phone]) Provider:  Veleta Miners, MD  [name], MD  Patient Care Team: [name], MD as PCP - General (Internal Medicine) Rutherford Guys, MD as Consulting Physician (Ophthalmology) [name], MD as Consulting Physician (Internal Medicine)  Extended Emergency Contact Information Primary Emergency Contact: Zwiebel,Leonard Address: [address] [place] [address] Johnnette Litter of [place] Phone: [phone] Work Phone: [phone] Mobile Phone: [phone] Relation: Son Secondary Emergency Contact: Gerarda Fraction [place] of [place] Phone: [phone] Relation: Friend  Code Status:  DNR Goals of care: Advanced Directive information    04/30/2022   10:21 AM  Advanced Directives  Does Patient Have a Medical Advance Directive? Yes  Type of Paramedic of [place];Living will;Out of facility DNR (pink MOST or yellow form)  Does patient want to make changes to medical advance directive? No - Patient declined  Copy of [place] in Chart? Yes - validated most recent copy scanned in chart (See row information)  Pre-existing out of facility DNR order (yellow form or pink MOST form) Pink MOST/Yellow Form most recent copy in chart - Physician notified to receive inpatient order     Chief Complaint  Patient presents with   Acute Visit    Confusion    HPI:  Pt is a 88 y.o. female seen today for medical management of chronic diseases.    Lives in [place] in [facility]  Patient has h/o Hypertension, Diabetes Mellitus, H/o CKD,, Low back Pain and Arthritis   Patient seen per request of family and Nurses who have noticed that she has been confused lately They have noticed she forgets things and gets anxious about her Mail. Like she is upset about the bill she got recently by [employer] plan. Her sons who are out  of state have noticed her to be more confused on phone  Patient denied any issues with her memory She is more anxious and was saying she worries more as her Children are out of state Denied Depression No Focal deficits or speech issues She still does her own taxes. Walks with her walker. No Falls  Past Medical History:  Diagnosis Date   Acute blood loss anemia    Allergy    Asthma    AVM (arteriovenous malformation) of colon with hemorrhage    Bursitis of right hip    Cataract    CKD (chronic kidney disease), stage III ([place]) 11/04/2015   COLONIC POLYPS 03/26/2005   Qualifier: Diagnosis of  By: Talbert Cage CMA (AAMA), June     DIVERTICULOSIS, COLON 03/26/2005   Qualifier: Diagnosis of  By: Talbert Cage CMA Deborra Medina), June     DVT (deep venous thrombosis), left 11/03/2015   Ecchymosis 12/16/2015   Hyperglycemia 12/16/2015   Hyperlipidemia    Hypertension    Multiple skin tears 12/16/2015   Osteopenia    Stasis edema of both lower extremities 11/04/2015   Unstable gait 12/16/2015   Urine incontinence 12/16/2015   Past Surgical History:  Procedure Laterality Date   ABDOMINAL HYSTERECTOMY  2006   Dr. Maryelizabeth Rowan   CATARACT EXTRACTION W/ INTRAOCULAR LENS IMPLANT Bilateral 1999/2003   Dr. Rutherford Guys   COLONOSCOPY N/A 11/06/2015   Procedure: COLONOSCOPY;  Surgeon: Mauri Pole, MD;  Location: [facility];  Service: Endoscopy;  Laterality: N/A;  MAC if available, otherwise moderate sedation  Allergies  Allergen Reactions   Epinephrine Nausea Only    Unknown.   Hydrocodone-Acetaminophen     REACTION: nausea   Labetalol    Procaine    Vitamin D3 [Cholecalciferol]     Allergies as of 05/02/2022       Reactions   Epinephrine Nausea Only   Unknown.   Hydrocodone-acetaminophen    REACTION: nausea   Labetalol    Procaine    Vitamin D3 [cholecalciferol]         Medication List        Accurate as of May 02, 2022  2:55 PM. If you have any questions, ask your nurse or doctor.           STOP taking these medications    loperamide 2 MG tablet Commonly known as: IMODIUM A-D Stopped by: [name], MD       TAKE these medications    acetaminophen 500 MG tablet Commonly known as: TYLENOL Take 500 mg by mouth 2 (two) times daily.   calcium carbonate 500 MG chewable tablet Commonly known as: TUMS - dosed in mg elemental calcium Chew 2 tablets by mouth every morning.   cholecalciferol 25 MCG (1000 UNIT) tablet Commonly known as: VITAMIN D3 Take 1,000 Units by mouth daily.   docusate sodium 100 MG capsule Commonly known as: COLACE Take 100 mg by mouth every morning.   furosemide 20 MG tablet Commonly known as: LASIX Take 1 tablet (20 mg total) by mouth daily.   gabapentin 300 MG capsule Commonly known as: NEURONTIN Take 300 mg by mouth 2 (two) times daily.   HYDROcodone-acetaminophen 5-325 MG tablet Commonly known as: NORCO/VICODIN Take 1 tablet by mouth 2 (two) times daily. Twice a day 8:00 am,8:00 pm   nystatin powder Commonly known as: MYCOSTATIN/NYSTOP Apply to groin 2 times daily as needed   PreserVision AREDS Caps Take 1 capsule by mouth in the morning.   THEREMS-M PO Take 1 tablet by mouth daily.   Procto-Med HC 2.5 % rectal cream Generic drug: hydrocortisone Place 1 Application rectally as needed for hemorrhoids or anal itching. for external rectal hemorrhoids. unsupervised self-administration May self administer   Sodium Fluoride 1.1 % Pste Place onto teeth daily.   zinc oxide 20 % ointment Apply 1 Application topically as needed for irritation (INCONTINENCE AND REDNESS.).        Review of Systems  Constitutional:  Negative for activity change and appetite change.  HENT: Negative.    Respiratory:  Negative for cough and shortness of breath.   Cardiovascular:  Negative for leg swelling.  Gastrointestinal:  Negative for constipation.  Genitourinary: Negative.   Musculoskeletal:  Negative for arthralgias, gait  problem and myalgias.  Skin: Negative.   Neurological:  Negative for dizziness and weakness.  Psychiatric/Behavioral:  Positive for confusion. Negative for dysphoric mood and sleep disturbance.     Immunization History  Administered Date(s) Administered   DTaP 05/13/2005   Fluad Quad(high Dose 65+) 03/06/2022   Influenza, High Dose Seasonal PF 02/23/2019, 02/15/2020   Influenza-Unspecified 02/11/2015, 02/22/2016, 02/19/2017, 02/16/2018, 03/06/2021   Moderna SARS-COV2 Booster Vaccination 10/18/2020   Moderna Sars-Covid-2 Vaccination 05/17/2019, 06/14/2019, 03/27/2020   PFIZER(Purple Top)SARS-COV-2 Vaccination 01/31/2021, 03/19/2022   PPD Test 12/08/2015, 12/22/2015   Pneumococcal Conjugate-13 05/13/2014   Pneumococcal Polysaccharide-23 05/13/2014, 03/17/2019   Td 12/11/2005   Tdap 01/04/2017   Zoster, Live 05/13/2014   Pertinent  Health Maintenance Due  Topic Date Due   FOOT EXAM  04/26/2022   HEMOGLOBIN A1C  10/25/2022   OPHTHALMOLOGY EXAM  03/01/2023   INFLUENZA VACCINE  Completed   DEXA SCAN  Completed      11/03/2015   10:28 AM 12/30/2016   12:38 PM 01/05/2018   11:59 AM 07/02/2021    3:54 PM 04/24/2022   11:06 AM  Fall Risk  Falls in the past year? No No No 0 0  Was there an injury with Fall?    0 0  Fall Risk Category Calculator    0 0  Fall Risk Category    Low Low  Patient Fall Risk Level    Low fall risk Low fall risk  Patient at Risk for Falls Due to    History of fall(s);Impaired balance/gait;Impaired mobility No Fall Risks  Fall risk Follow up    Falls evaluation completed;Education provided Falls evaluation completed   Functional Status Survey:    Vitals:   05/02/22 1401  BP: (!) 152/85  Pulse: 77  Resp: 12  Temp: (!) 97.3 F (36.3 C)  SpO2: 92%  Weight: 156 lb 12.8 oz (71.1 kg)  Height: _0  (1.6 m)   Body mass index is 27.78 kg/m. Physical Exam Vitals reviewed.  Constitutional:      Appearance: Normal appearance.  HENT:     Head:  Normocephalic.     Nose: Nose normal.     Mouth/Throat:     Mouth: Mucous membranes are moist.     Pharynx: Oropharynx is clear.  Eyes:     Pupils: Pupils are equal, round, and reactive to light.  Cardiovascular:     Rate and Rhythm: Normal rate and regular rhythm.     Pulses: Normal pulses.     Heart sounds: Normal heart sounds. No murmur heard. Pulmonary:     Effort: Pulmonary effort is normal.     Breath sounds: Normal breath sounds.  Abdominal:     General: Abdomen is flat. Bowel sounds are normal.     Palpations: Abdomen is soft.  Musculoskeletal:        General: Swelling present.     Cervical back: Neck supple.  Skin:    General: Skin is warm.  Neurological:     General: No focal deficit present.     Mental Status: She is alert and oriented to person, place, and time.  Psychiatric:        Mood and Affect: Mood normal.        Thought Content: Thought content normal.     Labs reviewed: Recent Labs    11/09/21 0000 12/10/21 0000 04/25/22 0000  NA 140 139 142  K 4.1 4.3 4.3  CL 104 103 103  CO2 29* 30* 28*  BUN 29* 20 32*  CREATININE 1.1 0.9 1.3*  CALCIUM 9.1 9.8 9.2   Recent Labs    11/09/21 0000 12/10/21 0000 04/25/22 0000  AST _1 ALT _2 ALKPHOS 38 60 41  ALBUMIN 4.0 3.8 4.0   Recent Labs    05/03/21 0000 11/09/21 0000 04/25/22 0000  WBC 5.6 6.0 7.5  NEUTROABS 2,682.00  --  4,320.00  HGB 13.2 13.1 12.9  HCT 41 41 40  PLT 205  --  238   Lab Results  Component Value Date   TSH 2.25 05/03/2021   Lab Results  Component Value Date   HGBA1C 6.2 04/25/2022   Lab Results  Component Value Date   CHOL 157 12/10/2021   HDL 46 12/10/2021   LDLCALC 90 12/10/2021   TRIG  109 12/10/2021    Significant Diagnostic Results in last 30 days:  No results found.  Assessment/Plan 1. Mild cognitive impairment She was Oriented for me including knew president Was little Anxious Will have speech evaluate for detail exam Talked to  them Labs done recently were all normal  2. Stage 3a chronic kidney disease (HCC) Creat slightly high Will follow  3. Chronic diastolic congestive heart failure (HCC) Lasix  4. Type 2 diabetes mellitus with chronic kidney disease, without long-term current use of insulin, unspecified CKD stage (HCC) A1C good levels  5. Osteoarthritis involving multiple joints on both sides of body On Hydrocodone  6. Idiopathic peripheral neuropathy On Neurontin    Family/ staff Communication:   Labs/tests ordered:

## 2022-05-03 DIAGNOSIS — M6281 Muscle weakness (generalized): Secondary | ICD-10-CM | POA: Diagnosis not present

## 2022-05-03 DIAGNOSIS — E782 Mixed hyperlipidemia: Secondary | ICD-10-CM | POA: Diagnosis not present

## 2022-05-03 DIAGNOSIS — M7071 Other bursitis of hip, right hip: Secondary | ICD-10-CM | POA: Diagnosis not present

## 2022-05-03 DIAGNOSIS — I10 Essential (primary) hypertension: Secondary | ICD-10-CM | POA: Diagnosis not present

## 2022-05-03 DIAGNOSIS — R262 Difficulty in walking, not elsewhere classified: Secondary | ICD-10-CM | POA: Diagnosis not present

## 2022-05-07 DIAGNOSIS — M7071 Other bursitis of hip, right hip: Secondary | ICD-10-CM | POA: Diagnosis not present

## 2022-05-07 DIAGNOSIS — E782 Mixed hyperlipidemia: Secondary | ICD-10-CM | POA: Diagnosis not present

## 2022-05-07 DIAGNOSIS — R262 Difficulty in walking, not elsewhere classified: Secondary | ICD-10-CM | POA: Diagnosis not present

## 2022-05-07 DIAGNOSIS — I10 Essential (primary) hypertension: Secondary | ICD-10-CM | POA: Diagnosis not present

## 2022-05-07 DIAGNOSIS — M6281 Muscle weakness (generalized): Secondary | ICD-10-CM | POA: Diagnosis not present

## 2022-05-08 ENCOUNTER — Encounter: Payer: Self-pay | Admitting: Orthopedic Surgery

## 2022-05-08 DIAGNOSIS — E782 Mixed hyperlipidemia: Secondary | ICD-10-CM | POA: Diagnosis not present

## 2022-05-08 DIAGNOSIS — I10 Essential (primary) hypertension: Secondary | ICD-10-CM | POA: Diagnosis not present

## 2022-05-08 DIAGNOSIS — R4189 Other symptoms and signs involving cognitive functions and awareness: Secondary | ICD-10-CM | POA: Insufficient documentation

## 2022-05-08 DIAGNOSIS — R262 Difficulty in walking, not elsewhere classified: Secondary | ICD-10-CM | POA: Diagnosis not present

## 2022-05-08 DIAGNOSIS — M6281 Muscle weakness (generalized): Secondary | ICD-10-CM | POA: Diagnosis not present

## 2022-05-08 DIAGNOSIS — M7071 Other bursitis of hip, right hip: Secondary | ICD-10-CM | POA: Diagnosis not present

## 2022-05-10 DIAGNOSIS — E782 Mixed hyperlipidemia: Secondary | ICD-10-CM | POA: Diagnosis not present

## 2022-05-10 DIAGNOSIS — M7071 Other bursitis of hip, right hip: Secondary | ICD-10-CM | POA: Diagnosis not present

## 2022-05-10 DIAGNOSIS — M6281 Muscle weakness (generalized): Secondary | ICD-10-CM | POA: Diagnosis not present

## 2022-05-10 DIAGNOSIS — R262 Difficulty in walking, not elsewhere classified: Secondary | ICD-10-CM | POA: Diagnosis not present

## 2022-05-10 DIAGNOSIS — I10 Essential (primary) hypertension: Secondary | ICD-10-CM | POA: Diagnosis not present

## 2022-05-14 DIAGNOSIS — E782 Mixed hyperlipidemia: Secondary | ICD-10-CM | POA: Diagnosis not present

## 2022-05-14 DIAGNOSIS — Z9849 Cataract extraction status, unspecified eye: Secondary | ICD-10-CM | POA: Diagnosis not present

## 2022-05-14 DIAGNOSIS — N200 Calculus of kidney: Secondary | ICD-10-CM | POA: Diagnosis not present

## 2022-05-14 DIAGNOSIS — M6281 Muscle weakness (generalized): Secondary | ICD-10-CM | POA: Diagnosis not present

## 2022-05-14 DIAGNOSIS — M7071 Other bursitis of hip, right hip: Secondary | ICD-10-CM | POA: Diagnosis not present

## 2022-05-14 DIAGNOSIS — I10 Essential (primary) hypertension: Secondary | ICD-10-CM | POA: Diagnosis not present

## 2022-05-14 DIAGNOSIS — R262 Difficulty in walking, not elsewhere classified: Secondary | ICD-10-CM | POA: Diagnosis not present

## 2022-05-15 DIAGNOSIS — R262 Difficulty in walking, not elsewhere classified: Secondary | ICD-10-CM | POA: Diagnosis not present

## 2022-05-15 DIAGNOSIS — M7071 Other bursitis of hip, right hip: Secondary | ICD-10-CM | POA: Diagnosis not present

## 2022-05-15 DIAGNOSIS — I10 Essential (primary) hypertension: Secondary | ICD-10-CM | POA: Diagnosis not present

## 2022-05-15 DIAGNOSIS — M6281 Muscle weakness (generalized): Secondary | ICD-10-CM | POA: Diagnosis not present

## 2022-05-15 DIAGNOSIS — E782 Mixed hyperlipidemia: Secondary | ICD-10-CM | POA: Diagnosis not present

## 2022-05-17 DIAGNOSIS — R262 Difficulty in walking, not elsewhere classified: Secondary | ICD-10-CM | POA: Diagnosis not present

## 2022-05-17 DIAGNOSIS — E782 Mixed hyperlipidemia: Secondary | ICD-10-CM | POA: Diagnosis not present

## 2022-05-17 DIAGNOSIS — M6281 Muscle weakness (generalized): Secondary | ICD-10-CM | POA: Diagnosis not present

## 2022-05-17 DIAGNOSIS — I10 Essential (primary) hypertension: Secondary | ICD-10-CM | POA: Diagnosis not present

## 2022-05-17 DIAGNOSIS — M7071 Other bursitis of hip, right hip: Secondary | ICD-10-CM | POA: Diagnosis not present

## 2022-05-21 DIAGNOSIS — E782 Mixed hyperlipidemia: Secondary | ICD-10-CM | POA: Diagnosis not present

## 2022-05-21 DIAGNOSIS — M6281 Muscle weakness (generalized): Secondary | ICD-10-CM | POA: Diagnosis not present

## 2022-05-21 DIAGNOSIS — I10 Essential (primary) hypertension: Secondary | ICD-10-CM | POA: Diagnosis not present

## 2022-05-21 DIAGNOSIS — R262 Difficulty in walking, not elsewhere classified: Secondary | ICD-10-CM | POA: Diagnosis not present

## 2022-05-21 DIAGNOSIS — M7071 Other bursitis of hip, right hip: Secondary | ICD-10-CM | POA: Diagnosis not present

## 2022-05-22 DIAGNOSIS — I10 Essential (primary) hypertension: Secondary | ICD-10-CM | POA: Diagnosis not present

## 2022-05-22 DIAGNOSIS — M7071 Other bursitis of hip, right hip: Secondary | ICD-10-CM | POA: Diagnosis not present

## 2022-05-22 DIAGNOSIS — M6281 Muscle weakness (generalized): Secondary | ICD-10-CM | POA: Diagnosis not present

## 2022-05-22 DIAGNOSIS — E782 Mixed hyperlipidemia: Secondary | ICD-10-CM | POA: Diagnosis not present

## 2022-05-22 DIAGNOSIS — R262 Difficulty in walking, not elsewhere classified: Secondary | ICD-10-CM | POA: Diagnosis not present

## 2022-05-24 DIAGNOSIS — M6281 Muscle weakness (generalized): Secondary | ICD-10-CM | POA: Diagnosis not present

## 2022-05-24 DIAGNOSIS — R262 Difficulty in walking, not elsewhere classified: Secondary | ICD-10-CM | POA: Diagnosis not present

## 2022-05-24 DIAGNOSIS — I10 Essential (primary) hypertension: Secondary | ICD-10-CM | POA: Diagnosis not present

## 2022-05-24 DIAGNOSIS — E782 Mixed hyperlipidemia: Secondary | ICD-10-CM | POA: Diagnosis not present

## 2022-05-24 DIAGNOSIS — M7071 Other bursitis of hip, right hip: Secondary | ICD-10-CM | POA: Diagnosis not present

## 2022-05-29 DIAGNOSIS — M6281 Muscle weakness (generalized): Secondary | ICD-10-CM | POA: Diagnosis not present

## 2022-05-29 DIAGNOSIS — M7071 Other bursitis of hip, right hip: Secondary | ICD-10-CM | POA: Diagnosis not present

## 2022-05-29 DIAGNOSIS — R262 Difficulty in walking, not elsewhere classified: Secondary | ICD-10-CM | POA: Diagnosis not present

## 2022-05-29 DIAGNOSIS — E782 Mixed hyperlipidemia: Secondary | ICD-10-CM | POA: Diagnosis not present

## 2022-05-29 DIAGNOSIS — I10 Essential (primary) hypertension: Secondary | ICD-10-CM | POA: Diagnosis not present

## 2022-05-31 DIAGNOSIS — R262 Difficulty in walking, not elsewhere classified: Secondary | ICD-10-CM | POA: Diagnosis not present

## 2022-05-31 DIAGNOSIS — I10 Essential (primary) hypertension: Secondary | ICD-10-CM | POA: Diagnosis not present

## 2022-05-31 DIAGNOSIS — E782 Mixed hyperlipidemia: Secondary | ICD-10-CM | POA: Diagnosis not present

## 2022-05-31 DIAGNOSIS — M6281 Muscle weakness (generalized): Secondary | ICD-10-CM | POA: Diagnosis not present

## 2022-05-31 DIAGNOSIS — M7071 Other bursitis of hip, right hip: Secondary | ICD-10-CM | POA: Diagnosis not present

## 2022-06-04 DIAGNOSIS — M7071 Other bursitis of hip, right hip: Secondary | ICD-10-CM | POA: Diagnosis not present

## 2022-06-04 DIAGNOSIS — I10 Essential (primary) hypertension: Secondary | ICD-10-CM | POA: Diagnosis not present

## 2022-06-04 DIAGNOSIS — M6281 Muscle weakness (generalized): Secondary | ICD-10-CM | POA: Diagnosis not present

## 2022-06-04 DIAGNOSIS — E782 Mixed hyperlipidemia: Secondary | ICD-10-CM | POA: Diagnosis not present

## 2022-06-04 DIAGNOSIS — R262 Difficulty in walking, not elsewhere classified: Secondary | ICD-10-CM | POA: Diagnosis not present

## 2022-06-05 DIAGNOSIS — I10 Essential (primary) hypertension: Secondary | ICD-10-CM | POA: Diagnosis not present

## 2022-06-05 DIAGNOSIS — M6281 Muscle weakness (generalized): Secondary | ICD-10-CM | POA: Diagnosis not present

## 2022-06-05 DIAGNOSIS — R262 Difficulty in walking, not elsewhere classified: Secondary | ICD-10-CM | POA: Diagnosis not present

## 2022-06-05 DIAGNOSIS — M7071 Other bursitis of hip, right hip: Secondary | ICD-10-CM | POA: Diagnosis not present

## 2022-06-05 DIAGNOSIS — E782 Mixed hyperlipidemia: Secondary | ICD-10-CM | POA: Diagnosis not present

## 2022-06-07 DIAGNOSIS — I10 Essential (primary) hypertension: Secondary | ICD-10-CM | POA: Diagnosis not present

## 2022-06-07 DIAGNOSIS — M7071 Other bursitis of hip, right hip: Secondary | ICD-10-CM | POA: Diagnosis not present

## 2022-06-07 DIAGNOSIS — I739 Peripheral vascular disease, unspecified: Secondary | ICD-10-CM | POA: Diagnosis not present

## 2022-06-07 DIAGNOSIS — L603 Nail dystrophy: Secondary | ICD-10-CM | POA: Diagnosis not present

## 2022-06-07 DIAGNOSIS — M79671 Pain in right foot: Secondary | ICD-10-CM | POA: Diagnosis not present

## 2022-06-07 DIAGNOSIS — E782 Mixed hyperlipidemia: Secondary | ICD-10-CM | POA: Diagnosis not present

## 2022-06-07 DIAGNOSIS — M6281 Muscle weakness (generalized): Secondary | ICD-10-CM | POA: Diagnosis not present

## 2022-06-07 DIAGNOSIS — R262 Difficulty in walking, not elsewhere classified: Secondary | ICD-10-CM | POA: Diagnosis not present

## 2022-06-07 DIAGNOSIS — M79672 Pain in left foot: Secondary | ICD-10-CM | POA: Diagnosis not present

## 2022-06-07 DIAGNOSIS — L97421 Non-pressure chronic ulcer of left heel and midfoot limited to breakdown of skin: Secondary | ICD-10-CM | POA: Diagnosis not present

## 2022-06-11 DIAGNOSIS — R262 Difficulty in walking, not elsewhere classified: Secondary | ICD-10-CM | POA: Diagnosis not present

## 2022-06-11 DIAGNOSIS — I10 Essential (primary) hypertension: Secondary | ICD-10-CM | POA: Diagnosis not present

## 2022-06-11 DIAGNOSIS — M6281 Muscle weakness (generalized): Secondary | ICD-10-CM | POA: Diagnosis not present

## 2022-06-11 DIAGNOSIS — E782 Mixed hyperlipidemia: Secondary | ICD-10-CM | POA: Diagnosis not present

## 2022-06-11 DIAGNOSIS — M7071 Other bursitis of hip, right hip: Secondary | ICD-10-CM | POA: Diagnosis not present

## 2022-06-12 DIAGNOSIS — I10 Essential (primary) hypertension: Secondary | ICD-10-CM | POA: Diagnosis not present

## 2022-06-12 DIAGNOSIS — R262 Difficulty in walking, not elsewhere classified: Secondary | ICD-10-CM | POA: Diagnosis not present

## 2022-06-12 DIAGNOSIS — E782 Mixed hyperlipidemia: Secondary | ICD-10-CM | POA: Diagnosis not present

## 2022-06-12 DIAGNOSIS — M6281 Muscle weakness (generalized): Secondary | ICD-10-CM | POA: Diagnosis not present

## 2022-06-12 DIAGNOSIS — M7071 Other bursitis of hip, right hip: Secondary | ICD-10-CM | POA: Diagnosis not present

## 2022-06-13 ENCOUNTER — Other Ambulatory Visit: Payer: Self-pay | Admitting: Adult Health

## 2022-06-13 DIAGNOSIS — Z9849 Cataract extraction status, unspecified eye: Secondary | ICD-10-CM | POA: Diagnosis not present

## 2022-06-13 DIAGNOSIS — E782 Mixed hyperlipidemia: Secondary | ICD-10-CM | POA: Diagnosis not present

## 2022-06-13 DIAGNOSIS — M6281 Muscle weakness (generalized): Secondary | ICD-10-CM | POA: Diagnosis not present

## 2022-06-13 DIAGNOSIS — N200 Calculus of kidney: Secondary | ICD-10-CM | POA: Diagnosis not present

## 2022-06-13 DIAGNOSIS — R262 Difficulty in walking, not elsewhere classified: Secondary | ICD-10-CM | POA: Diagnosis not present

## 2022-06-13 DIAGNOSIS — I10 Essential (primary) hypertension: Secondary | ICD-10-CM | POA: Diagnosis not present

## 2022-06-13 DIAGNOSIS — M7071 Other bursitis of hip, right hip: Secondary | ICD-10-CM | POA: Diagnosis not present

## 2022-06-13 MED ORDER — HYDROCODONE-ACETAMINOPHEN 5-325 MG PO TABS: 1.0000 | ORAL_TABLET | Freq: Two times a day (BID) | ORAL | 0 refills | Status: DC

## 2022-06-14 ENCOUNTER — Other Ambulatory Visit: Payer: Self-pay | Admitting: Orthopedic Surgery

## 2022-06-18 DIAGNOSIS — I10 Essential (primary) hypertension: Secondary | ICD-10-CM | POA: Diagnosis not present

## 2022-06-18 DIAGNOSIS — E782 Mixed hyperlipidemia: Secondary | ICD-10-CM | POA: Diagnosis not present

## 2022-06-18 DIAGNOSIS — R262 Difficulty in walking, not elsewhere classified: Secondary | ICD-10-CM | POA: Diagnosis not present

## 2022-06-18 DIAGNOSIS — M6281 Muscle weakness (generalized): Secondary | ICD-10-CM | POA: Diagnosis not present

## 2022-06-18 DIAGNOSIS — M7071 Other bursitis of hip, right hip: Secondary | ICD-10-CM | POA: Diagnosis not present

## 2022-06-19 DIAGNOSIS — R262 Difficulty in walking, not elsewhere classified: Secondary | ICD-10-CM | POA: Diagnosis not present

## 2022-06-19 DIAGNOSIS — E782 Mixed hyperlipidemia: Secondary | ICD-10-CM | POA: Diagnosis not present

## 2022-06-19 DIAGNOSIS — I10 Essential (primary) hypertension: Secondary | ICD-10-CM | POA: Diagnosis not present

## 2022-06-19 DIAGNOSIS — M6281 Muscle weakness (generalized): Secondary | ICD-10-CM | POA: Diagnosis not present

## 2022-06-19 DIAGNOSIS — M7071 Other bursitis of hip, right hip: Secondary | ICD-10-CM | POA: Diagnosis not present

## 2022-06-21 DIAGNOSIS — M7071 Other bursitis of hip, right hip: Secondary | ICD-10-CM | POA: Diagnosis not present

## 2022-06-21 DIAGNOSIS — R262 Difficulty in walking, not elsewhere classified: Secondary | ICD-10-CM | POA: Diagnosis not present

## 2022-06-21 DIAGNOSIS — I10 Essential (primary) hypertension: Secondary | ICD-10-CM | POA: Diagnosis not present

## 2022-06-21 DIAGNOSIS — M6281 Muscle weakness (generalized): Secondary | ICD-10-CM | POA: Diagnosis not present

## 2022-06-21 DIAGNOSIS — E782 Mixed hyperlipidemia: Secondary | ICD-10-CM | POA: Diagnosis not present

## 2022-06-25 DIAGNOSIS — M6281 Muscle weakness (generalized): Secondary | ICD-10-CM | POA: Diagnosis not present

## 2022-06-25 DIAGNOSIS — M7071 Other bursitis of hip, right hip: Secondary | ICD-10-CM | POA: Diagnosis not present

## 2022-06-25 DIAGNOSIS — R262 Difficulty in walking, not elsewhere classified: Secondary | ICD-10-CM | POA: Diagnosis not present

## 2022-06-25 DIAGNOSIS — I10 Essential (primary) hypertension: Secondary | ICD-10-CM | POA: Diagnosis not present

## 2022-06-25 DIAGNOSIS — E782 Mixed hyperlipidemia: Secondary | ICD-10-CM | POA: Diagnosis not present

## 2022-06-26 DIAGNOSIS — I10 Essential (primary) hypertension: Secondary | ICD-10-CM | POA: Diagnosis not present

## 2022-06-26 DIAGNOSIS — M7071 Other bursitis of hip, right hip: Secondary | ICD-10-CM | POA: Diagnosis not present

## 2022-06-26 DIAGNOSIS — R262 Difficulty in walking, not elsewhere classified: Secondary | ICD-10-CM | POA: Diagnosis not present

## 2022-06-26 DIAGNOSIS — M6281 Muscle weakness (generalized): Secondary | ICD-10-CM | POA: Diagnosis not present

## 2022-06-26 DIAGNOSIS — E782 Mixed hyperlipidemia: Secondary | ICD-10-CM | POA: Diagnosis not present

## 2022-06-28 DIAGNOSIS — M6281 Muscle weakness (generalized): Secondary | ICD-10-CM | POA: Diagnosis not present

## 2022-06-28 DIAGNOSIS — E782 Mixed hyperlipidemia: Secondary | ICD-10-CM | POA: Diagnosis not present

## 2022-06-28 DIAGNOSIS — R262 Difficulty in walking, not elsewhere classified: Secondary | ICD-10-CM | POA: Diagnosis not present

## 2022-06-28 DIAGNOSIS — M7071 Other bursitis of hip, right hip: Secondary | ICD-10-CM | POA: Diagnosis not present

## 2022-06-28 DIAGNOSIS — I10 Essential (primary) hypertension: Secondary | ICD-10-CM | POA: Diagnosis not present

## 2022-07-02 DIAGNOSIS — I10 Essential (primary) hypertension: Secondary | ICD-10-CM | POA: Diagnosis not present

## 2022-07-02 DIAGNOSIS — M6281 Muscle weakness (generalized): Secondary | ICD-10-CM | POA: Diagnosis not present

## 2022-07-02 DIAGNOSIS — E782 Mixed hyperlipidemia: Secondary | ICD-10-CM | POA: Diagnosis not present

## 2022-07-02 DIAGNOSIS — R262 Difficulty in walking, not elsewhere classified: Secondary | ICD-10-CM | POA: Diagnosis not present

## 2022-07-02 DIAGNOSIS — M7071 Other bursitis of hip, right hip: Secondary | ICD-10-CM | POA: Diagnosis not present

## 2022-07-03 DIAGNOSIS — R262 Difficulty in walking, not elsewhere classified: Secondary | ICD-10-CM | POA: Diagnosis not present

## 2022-07-03 DIAGNOSIS — I10 Essential (primary) hypertension: Secondary | ICD-10-CM | POA: Diagnosis not present

## 2022-07-03 DIAGNOSIS — M7071 Other bursitis of hip, right hip: Secondary | ICD-10-CM | POA: Diagnosis not present

## 2022-07-03 DIAGNOSIS — M6281 Muscle weakness (generalized): Secondary | ICD-10-CM | POA: Diagnosis not present

## 2022-07-03 DIAGNOSIS — E782 Mixed hyperlipidemia: Secondary | ICD-10-CM | POA: Diagnosis not present

## 2022-07-04 DIAGNOSIS — M7071 Other bursitis of hip, right hip: Secondary | ICD-10-CM | POA: Diagnosis not present

## 2022-07-04 DIAGNOSIS — R262 Difficulty in walking, not elsewhere classified: Secondary | ICD-10-CM | POA: Diagnosis not present

## 2022-07-04 DIAGNOSIS — E782 Mixed hyperlipidemia: Secondary | ICD-10-CM | POA: Diagnosis not present

## 2022-07-04 DIAGNOSIS — M6281 Muscle weakness (generalized): Secondary | ICD-10-CM | POA: Diagnosis not present

## 2022-07-04 DIAGNOSIS — I10 Essential (primary) hypertension: Secondary | ICD-10-CM | POA: Diagnosis not present

## 2022-07-09 DIAGNOSIS — R262 Difficulty in walking, not elsewhere classified: Secondary | ICD-10-CM | POA: Diagnosis not present

## 2022-07-09 DIAGNOSIS — I10 Essential (primary) hypertension: Secondary | ICD-10-CM | POA: Diagnosis not present

## 2022-07-09 DIAGNOSIS — E782 Mixed hyperlipidemia: Secondary | ICD-10-CM | POA: Diagnosis not present

## 2022-07-09 DIAGNOSIS — M7071 Other bursitis of hip, right hip: Secondary | ICD-10-CM | POA: Diagnosis not present

## 2022-07-09 DIAGNOSIS — M6281 Muscle weakness (generalized): Secondary | ICD-10-CM | POA: Diagnosis not present

## 2022-07-11 DIAGNOSIS — M6281 Muscle weakness (generalized): Secondary | ICD-10-CM | POA: Diagnosis not present

## 2022-07-11 DIAGNOSIS — R262 Difficulty in walking, not elsewhere classified: Secondary | ICD-10-CM | POA: Diagnosis not present

## 2022-07-11 DIAGNOSIS — M7071 Other bursitis of hip, right hip: Secondary | ICD-10-CM | POA: Diagnosis not present

## 2022-07-11 DIAGNOSIS — E782 Mixed hyperlipidemia: Secondary | ICD-10-CM | POA: Diagnosis not present

## 2022-07-11 DIAGNOSIS — I10 Essential (primary) hypertension: Secondary | ICD-10-CM | POA: Diagnosis not present

## 2022-07-12 ENCOUNTER — Other Ambulatory Visit: Payer: Self-pay

## 2022-07-12 NOTE — Telephone Encounter (Signed)
Yolanda [name] has request refill on medication Hydrocodone. Patient medication last refilled 06/13/2022. Patient has No Contract on file. Medication pend and sent to PCP [name], MD for approval.

## 2022-07-15 ENCOUNTER — Encounter: Payer: Self-pay | Admitting: Orthopedic Surgery

## 2022-07-15 ENCOUNTER — Non-Acute Institutional Stay (INDEPENDENT_AMBULATORY_CARE_PROVIDER_SITE_OTHER): Payer: Medicare Other | Admitting: Orthopedic Surgery

## 2022-07-15 DIAGNOSIS — Z0000 Encounter for general adult medical examination without abnormal findings: Secondary | ICD-10-CM

## 2022-07-15 MED ORDER — HYDROCODONE-ACETAMINOPHEN 5-325 MG PO TABS: 1.0000 | ORAL_TABLET | Freq: Two times a day (BID) | ORAL | 0 refills | Status: DC

## 2022-07-15 NOTE — Progress Notes (Signed)
Subjective:   Yolanda [name] is a 89 y.o. female who presents for Medicare Annual (Subsequent) preventive examination.  Place of Service: [place]- assisted living Provider: Windell Moulding, AGNP-C   Review of Systems     Cardiac Risk Factors include: advanced age (>27mn, >>85women);diabetes mellitus;hypertension;sedentary lifestyle     Objective:    Today's Vitals   07/15/22 1253  BP: 138/67  Pulse: 73  Resp: 18  Temp: (!) 97 F (36.1 C)  SpO2: 92%  Weight: 154 lb 3.2 oz (69.9 kg)  Height: '5\' 3"'$  (1.6 m)   Body mass index is 27.32 kg/m.     04/30/2022   10:21 AM 01/31/2022    4:26 PM 11/05/2021   11:43 AM 07/26/2021    3:06 PM 07/02/2021    1:38 PM 05/08/2021    3:18 PM 04/27/2021   10:34 AM  Advanced Directives  Does Patient Have a Medical Advance Directive? Yes Yes Yes Yes Yes Yes Yes  Type of AParamedicof AHollisterLiving will;Out of facility DNR (pink MOST or yellow form) HV[place]Living will;Out of facility DNR (pink MOST or yellow form) HAthensLiving will;Out of facility DNR (pink MOST or yellow form) HM[place]Living will;Out of facility DNR (pink MOST or yellow form) HLos PradosLiving will;Out of facility DNR (pink MOST or yellow form) HTwin LakesLiving will;Out of facility DNR (pink MOST or yellow form) [place]Living will;Out of facility DNR (pink MOST or yellow form)  Does patient want to make changes to medical advance directive? No - Patient declined No - Patient declined No - Patient declined No - Patient declined No - Patient declined No - Patient declined No - Patient declined  Copy of [place] Chart? Yes - validated most recent copy scanned in chart (See row information) Yes - validated most recent copy scanned in chart (See row information) Yes - validated most recent copy scanned in  chart (See row information) Yes - validated most recent copy scanned in chart (See row information) Yes - validated most recent copy scanned in chart (See row information) Yes - validated most recent copy scanned in chart (See row information) Yes - validated most recent copy scanned in chart (See row information)  Pre-existing out of facility DNR order (yellow form or pink MOST form) Pink MOST/Yellow Form most recent copy in chart - Physician notified to receive inpatient order Pink Most/Yellow Form available - Physician notified to receive inpatient order;Yellow form placed in chart (order not valid for inpatient use)  Pink MOST form placed in chart (order not valid for inpatient use);Yellow form placed in chart (order not valid for inpatient use) Pink MOST form placed in chart (order not valid for inpatient use);Yellow form placed in chart (order not valid for inpatient use) Pink MOST form placed in chart (order not valid for inpatient use);Yellow form placed in chart (order not valid for inpatient use) Pink MOST form placed in chart (order not valid for inpatient use);Yellow form placed in chart (order not valid for inpatient use)    Current Medications (verified) Outpatient Encounter Medications as of 07/15/2022  Medication Sig   acetaminophen (TYLENOL) 500 MG tablet Take 500 mg by mouth 2 (two) times daily.    calcium carbonate (TUMS - DOSED IN MG ELEMENTAL CALCIUM) 500 MG chewable tablet Chew 2 tablets by mouth every morning.    cholecalciferol (VITAMIN D3) 25 MCG (1000 UNIT)  tablet Take 1,000 Units by mouth daily.   docusate sodium (COLACE) 100 MG capsule Take 100 mg by mouth every morning.   furosemide (LASIX) 20 MG tablet Take 1 tablet (20 mg total) by mouth daily.   gabapentin (NEURONTIN) 300 MG capsule Take 300 mg by mouth 2 (two) times daily.   HYDROcodone-acetaminophen (NORCO/VICODIN) 5-325 MG tablet Take 1 tablet by mouth 2 (two) times daily. Twice a day 8:00 am,8:00 pm   hydrocortisone  (PROCTO-MED HC) 2.5 % rectal cream Place 1 Application rectally as needed for hemorrhoids or anal itching. for external rectal hemorrhoids. unsupervised self-administration May self administer   Multiple Vitamins-Minerals (PRESERVISION AREDS) CAPS Take 1 capsule by mouth in the morning.   Multiple Vitamins-Minerals (THEREMS-M PO) Take 1 tablet by mouth daily.   nystatin (MYCOSTATIN/NYSTOP) powder Apply to groin 2 times daily as needed   Sodium Fluoride 1.1 % PSTE Place onto teeth daily.   zinc oxide 20 % ointment Apply 1 Application topically as needed for irritation (INCONTINENCE AND REDNESS.).   No facility-administered encounter medications on file as of 07/15/2022.    Allergies (verified) Epinephrine, Hydrocodone-acetaminophen, Labetalol, Procaine, and Vitamin d3 [cholecalciferol]   History: Past Medical History:  Diagnosis Date   Acute blood loss anemia    Allergy    Asthma    AVM (arteriovenous malformation) of colon with hemorrhage    Bursitis of right hip    Cataract    CKD (chronic kidney disease), stage III ([place]) 11/04/2015   COLONIC POLYPS 03/26/2005   Qualifier: Diagnosis of  By: Talbert Cage CMA (AAMA), June     DIVERTICULOSIS, COLON 03/26/2005   Qualifier: Diagnosis of  By: Talbert Cage CMA Deborra Medina), June     DVT (deep venous thrombosis), left 11/03/2015   Ecchymosis 12/16/2015   Hyperglycemia 12/16/2015   Hyperlipidemia    Hypertension    Multiple skin tears 12/16/2015   Osteopenia    Stasis edema of both lower extremities 11/04/2015   Unstable gait 12/16/2015   Urine incontinence 12/16/2015   Past Surgical History:  Procedure Laterality Date   ABDOMINAL HYSTERECTOMY  2006   Dr. Maryelizabeth Rowan   CATARACT EXTRACTION W/ INTRAOCULAR LENS IMPLANT Bilateral 1999/2003   Dr. Rutherford Guys   COLONOSCOPY N/A 11/06/2015   Procedure: COLONOSCOPY;  Surgeon: Mauri Pole, MD;  Location: [facility];  Service: Endoscopy;  Laterality: N/A;  MAC if available, otherwise moderate sedation    Family History  Problem Relation Age of Onset   Heart attack Father 63   Social History   Socioeconomic History   Marital status: Widowed    Spouse name: Not on file   Number of children: 2   Years of education: Not on file   Highest education level: Not on file  Occupational History   Occupation: retired Art gallery manager  Tobacco Use   Smoking status: Never   Smokeless tobacco: Never  Vaping Use   Vaping Use: Never used  Substance and Sexual Activity   Alcohol use: No    Alcohol/week: 0.0 standard drinks of alcohol   Drug use: No   Sexual activity: Never    Comment: was until a few years ago  Other Topics Concern   Not on file  Social History Narrative   Lives alone in an retirement community. [place]/08/15   Walks with a walker.   NOK: 2 sons with shared POA (they live far away) - Shanon Brow Dintinfass would be first call.   Never smoked   Alcohol none  Exercise none   POA, Living Will, MOST             Social Determinants of Health   Financial Resource Strain: Low Risk  (07/15/2022)   Overall Financial Resource Strain (CARDIA)    Difficulty of Paying Living Expenses: Not hard at all  Food Insecurity: No Food Insecurity (07/15/2022)   Hunger Vital Sign    Worried About Running Out of Food in the Last Year: Never true    Ran Out of Food in the Last Year: Never true  Transportation Needs: No Transportation Needs (07/15/2022)   PRAPARE - Hydrologist (Medical): No    Lack of Transportation (Non-Medical): No  Physical Activity: Insufficiently Active (07/15/2022)   Exercise Vital Sign    Days of Exercise per Week: 5 days    Minutes of Exercise per Session: 20 min  Stress: No Stress Concern Present (07/15/2022)   [place]    Feeling of Stress : Not at all  Social Connections: Socially Isolated (07/15/2022)   Social Connection and Isolation Panel [NHANES]    Frequency of  Communication with Friends and Family: More than three times a week    Frequency of Social Gatherings with Friends and Family: More than three times a week    Attends Religious Services: Never    Marine scientist or Organizations: No    Attends Archivist Meetings: Never    Marital Status: Widowed    Tobacco Counseling Counseling given: Not Answered   Clinical Intake:  Pre-visit preparation completed: No  Pain : No/denies pain     BMI - recorded: 27.32 Nutritional Status: BMI 25 -29 Overweight Nutritional Risks: None Diabetes: Yes CBG done?: No Did pt. bring in CBG monitor from home?: No  How often do you need to have someone help you when you read instructions, pamphlets, or other written materials from your doctor or pharmacy?: 3 - Sometimes What is the last grade level you completed in school?: college  Diabetic?Yes  Interpreter Needed?: No      Activities of Daily Living    07/15/2022    1:12 PM  In your present state of health, do you have any difficulty performing the following activities:  Hearing? 0  Vision? 0  Difficulty concentrating or making decisions? 0  Walking or climbing stairs? 1  Dressing or bathing? 1  Doing errands, shopping? 1  Preparing Food and eating ? Y  Using the Toilet? Y  In the past six months, have you accidently leaked urine? Y  Do you have problems with loss of bowel control? N  Managing your Medications? Y  Managing your Finances? N  Housekeeping or managing your Housekeeping? N    Patient Care Team: [name], MD as PCP - General (Internal Medicine) Rutherford Guys, MD as Consulting Physician (Ophthalmology) [name], MD as Consulting Physician (Internal Medicine)  Indicate any recent Medical Services you may have received from other than [facility] providers in the past year (date may be approximate).     Assessment:   This is a routine wellness examination for [facility].  Hearing/Vision screen No  results found.  Dietary issues and exercise activities discussed: Current Exercise Habits: The patient does not participate in regular exercise at present, Exercise limited by: cardiac condition(s);orthopedic condition(s);neurologic condition(s)   Goals Addressed             This Visit's Progress    Maintain  Mobility and Function   On track    Evidence-based guidance:  Emphasize the importance of physical activity and aerobic exercise as included in treatment plan; assess barriers to adherence; consider patient's abilities and preferences.  Encourage gradual increase in activity or exercise instead of stopping if pain occurs.  Reinforce individual therapy exercise prescription, such as strengthening, stabilization and stretching programs.  Promote optimal body mechanics to stabilize the spine with lifting and functional activity.  Encourage activity and mobility modifications to facilitate optimal function, such as using a log roll for bed mobility or dressing from a seated position.  Reinforce individual adaptive equipment recommendations to limit excessive spinal movements, such as a Systems analyst.  Assess adequacy of sleep; encourage use of sleep hygiene techniques, such as bedtime routine; use of white noise; dark, cool bedroom; avoiding daytime naps, heavy meals or exercise before bedtime.  Promote positions and modification to optimize sleep and sexual activity; consider pillows or positioning devices to assist in maintaining neutral spine.  Explore options for applying ergonomic principles at work and home, such as frequent position changes, using ergonomically designed equipment and working at optimal height.  Promote modifications to increase comfort with driving such as lumbar support, optimizing seat and steering wheel position, using cruise control and taking frequent rest stops to stretch and walk.   Notes:        Depression Screen    07/15/2022    1:11 PM 07/15/2022     1:10 PM 04/24/2022   11:05 AM 07/02/2021    3:53 PM 01/05/2018   11:59 AM 12/30/2016   12:38 PM 11/03/2015   10:28 AM  PHQ 2/9 Scores  PHQ - 2 Score 0 0 0 0 0 0 0    Fall Risk    07/15/2022    1:12 PM 04/24/2022   11:06 AM 07/02/2021    3:54 PM 01/05/2018   11:59 AM 12/30/2016   12:38 PM  [place] in the past year? 0 0 0 No No  Number falls in past yr: 0 0 0    Injury with Fall? 0 0 0    Risk for fall due to : History of fall(s);Impaired balance/gait;Impaired mobility No Fall Risks History of fall(s);Impaired balance/gait;Impaired mobility    Follow up Falls evaluation completed;Falls prevention discussed;Education provided Falls evaluation completed Falls evaluation completed;Education provided      FALL RISK PREVENTION PERTAINING TO THE HOME:  Any stairs in or around the home? No  If so, are there any without handrails? No  Home free of loose throw rugs in walkways, pet beds, electrical cords, etc? Yes  Adequate lighting in your home to reduce risk of falls? Yes   ASSISTIVE DEVICES UTILIZED TO PREVENT FALLS:  Life alert? No  Use of a cane, walker or w/c? Yes  Grab bars in the bathroom? Yes  Shower chair or bench in shower? Yes  Elevated toilet seat or a handicapped toilet? Yes   TIMED UP AND GO:  Was the test performed? No .  Length of time to ambulate 10 feet: N/A sec.   Gait slow and steady with assistive device  Cognitive Function:    07/15/2022    1:13 PM 07/02/2021    3:55 PM 01/05/2018   11:59 AM 12/30/2016   12:42 PM  MMSE - Mini Mental State Exam  Orientation to time '3 5 5 5  '$ Orientation to Place '5 5 5 5  '$ Registration '2 3 3 3  '$ Attention/ Calculation 5 4  5 5  Recall '1 3 3 3  '$ Language- name 2 objects '2 2 2 2  '$ Language- repeat '1 1 1 1  '$ Language- follow 3 step command '3 3 3 3  '$ Language- read & follow direction '1 1 1 1  '$ Write a sentence '1 1 1 1  '$ Copy design '1 1 1 1  '$ Total score '25 29 30 30        '$ 07/02/2021    3:56 PM  6CIT Screen  What  Year? 0 points  What month? 0 points  What time? 3 points  Count back from 20 0 points  Months in reverse 0 points  Repeat phrase 0 points  Total Score 3 points    Immunizations Immunization History  Administered Date(s) Administered   DTaP 05/13/2005   Fluad Quad(high Dose 65+) 03/06/2022   Influenza, High Dose Seasonal PF 02/23/2019, 02/15/2020   Influenza-Unspecified 02/11/2015, 02/22/2016, 02/19/2017, 02/16/2018, 03/06/2021   Moderna SARS-COV2 Booster Vaccination 10/18/2020   Moderna Sars-Covid-2 Vaccination 05/17/2019, 06/14/2019, 03/27/2020   PFIZER(Purple Top)SARS-COV-2 Vaccination 01/31/2021, 03/19/2022   PPD Test 12/08/2015, 12/22/2015   Pneumococcal Conjugate-13 05/13/2014   Pneumococcal Polysaccharide-23 05/13/2014, 03/17/2019   Td 12/11/2005   Tdap 01/04/2017   Zoster, Live 05/13/2014    TDAP status: Up to date  Flu Vaccine status: Up to date  Pneumococcal vaccine status: Up to date  Covid-19 vaccine status: Completed vaccines  Qualifies for Shingles Vaccine? Yes   Zostavax completed Yes   Shingrix Completed?: No.    Education has been provided regarding the importance of this vaccine. Patient has been advised to call insurance company to determine out of pocket expense if they have not yet received this vaccine. Advised may also receive vaccine at local pharmacy or Health Dept. Verbalized acceptance and understanding.  Screening Tests Health Maintenance  Topic Date Due   Zoster Vaccines- Shingrix (1 of 2) Never done   FOOT EXAM  04/26/2022   COVID-19 Vaccine (7 - 2023-24 season) 05/14/2022   HEMOGLOBIN A1C  10/25/2022   OPHTHALMOLOGY EXAM  03/01/2023   Medicare Annual Wellness (AWV)  07/15/2023   DTaP/Tdap/Td (4 - Td or Tdap) 01/05/2027   Pneumonia Vaccine 82+ Years old  Completed   INFLUENZA VACCINE  Completed   DEXA SCAN  Completed   HPV VACCINES  Aged Out    Health Maintenance  Health Maintenance Due  Topic Date Due   Zoster Vaccines-  Shingrix (1 of 2) Never done   FOOT EXAM  04/26/2022   COVID-19 Vaccine (7 - 2023-24 season) 05/14/2022    Colorectal cancer screening: No longer required.   Mammogram status: No longer required due to advanced age.  Bone Density status: Completed 2015. Results reflect: Bone density results: OSTEOPENIA. Repeat every 2 years.  Lung Cancer Screening: (Low Dose CT Chest recommended if Age 83-80 years, 30 pack-year currently smoking OR have quit w/in 15years.) does not qualify.   Lung Cancer Screening Referral: No  Additional Screening:  Hepatitis C Screening: does not qualify; Completed   Vision Screening: Recommended annual ophthalmology exams for early detection of glaucoma and other disorders of the eye. Is the patient up to date with their annual eye exam?  Yes  Who is the provider or what is the name of the office in which the patient attends annual eye exams? In house provider If pt is not established with a provider, would they like to be referred to a provider to establish care? No .   Dental Screening: Recommended annual dental exams  for proper oral hygiene  Community Resource Referral / Chronic Care Management: CRR required this visit?  No   CCM required this visit?  No      Plan:     I have personally reviewed and noted the following in the patient's chart:   Medical and social history Use of alcohol, tobacco or illicit drugs  Current medications and supplements including opioid prescriptions. Patient is currently taking opioid prescriptions. Information provided to patient regarding non-opioid alternatives. Patient advised to discuss non-opioid treatment plan with their provider. Functional ability and status Nutritional status Physical activity Advanced directives List of other physicians Hospitalizations, surgeries, and ER visits in previous 12 months Vitals Screenings to include cognitive, depression, and falls Referrals and appointments  In addition, I  have reviewed and discussed with patient certain preventive protocols, quality metrics, and best practice recommendations. A written personalized care plan for preventive services as well as general preventive health recommendations were provided to patient.     Yvonna Alanis, NP   07/15/2022   Nurse Notes: none

## 2022-07-16 DIAGNOSIS — E782 Mixed hyperlipidemia: Secondary | ICD-10-CM | POA: Diagnosis not present

## 2022-07-16 DIAGNOSIS — N200 Calculus of kidney: Secondary | ICD-10-CM | POA: Diagnosis not present

## 2022-07-16 DIAGNOSIS — R262 Difficulty in walking, not elsewhere classified: Secondary | ICD-10-CM | POA: Diagnosis not present

## 2022-07-16 DIAGNOSIS — M7071 Other bursitis of hip, right hip: Secondary | ICD-10-CM | POA: Diagnosis not present

## 2022-07-16 DIAGNOSIS — Z9849 Cataract extraction status, unspecified eye: Secondary | ICD-10-CM | POA: Diagnosis not present

## 2022-07-16 DIAGNOSIS — M6281 Muscle weakness (generalized): Secondary | ICD-10-CM | POA: Diagnosis not present

## 2022-07-16 DIAGNOSIS — I10 Essential (primary) hypertension: Secondary | ICD-10-CM | POA: Diagnosis not present

## 2022-07-18 ENCOUNTER — Encounter: Payer: Self-pay | Admitting: Internal Medicine

## 2022-07-18 ENCOUNTER — Non-Acute Institutional Stay: Payer: Medicare Other | Admitting: Internal Medicine

## 2022-07-18 DIAGNOSIS — G3184 Mild cognitive impairment, so stated: Secondary | ICD-10-CM | POA: Diagnosis not present

## 2022-07-18 DIAGNOSIS — E1122 Type 2 diabetes mellitus with diabetic chronic kidney disease: Secondary | ICD-10-CM | POA: Diagnosis not present

## 2022-07-18 DIAGNOSIS — F32A Depression, unspecified: Secondary | ICD-10-CM | POA: Diagnosis not present

## 2022-07-18 DIAGNOSIS — R262 Difficulty in walking, not elsewhere classified: Secondary | ICD-10-CM | POA: Diagnosis not present

## 2022-07-18 DIAGNOSIS — M159 Polyosteoarthritis, unspecified: Secondary | ICD-10-CM | POA: Diagnosis not present

## 2022-07-18 DIAGNOSIS — N1832 Chronic kidney disease, stage 3b: Secondary | ICD-10-CM

## 2022-07-18 DIAGNOSIS — E782 Mixed hyperlipidemia: Secondary | ICD-10-CM | POA: Diagnosis not present

## 2022-07-18 DIAGNOSIS — I5032 Chronic diastolic (congestive) heart failure: Secondary | ICD-10-CM | POA: Diagnosis not present

## 2022-07-18 DIAGNOSIS — M6281 Muscle weakness (generalized): Secondary | ICD-10-CM | POA: Diagnosis not present

## 2022-07-18 DIAGNOSIS — I10 Essential (primary) hypertension: Secondary | ICD-10-CM | POA: Diagnosis not present

## 2022-07-18 DIAGNOSIS — G609 Hereditary and idiopathic neuropathy, unspecified: Secondary | ICD-10-CM

## 2022-07-18 DIAGNOSIS — M7071 Other bursitis of hip, right hip: Secondary | ICD-10-CM | POA: Diagnosis not present

## 2022-07-18 NOTE — Progress Notes (Signed)
Location:  [place] Room Number: [place] of Service:  ALF [phone]) Provider:  [name], MD   [name], MD  Patient Care Team: [name], MD as PCP - General (Internal Medicine) Rutherford Guys, MD as Consulting Physician (Ophthalmology) [name], MD as Consulting Physician (Internal Medicine)  Extended Emergency Contact Information Primary Emergency Contact: Kersten,Leonard Address: [address]          [place] [address] Johnnette Litter of [place] Phone: [phone] Work Phone: [phone] Mobile Phone: [phone] Relation: Son Secondary Emergency Contact: Gerarda Fraction States of [place] Phone: [phone] Relation: Friend  Code Status:  DNR Goals of care: Advanced Directive information    07/18/2022    8:45 AM  Advanced Directives  Does Patient Have a Medical Advance Directive? Yes  Type of Advance Directive Out of facility DNR (pink MOST or yellow form)  Does patient want to make changes to medical advance directive? No - Patient declined     Chief Complaint  Patient presents with   Medical Management of Chronic Issues    Routine visit    Quality Metric Gaps    Discussed the need for foot exam     HPI:  Yolanda [name] is a 89 y.o. female seen today for medical management of chronic diseases.    Lives in [place] in [facility]   Patient has h/o Hypertension, Diabetes Mellitus, H/o CKD,, Low back Pain and Arthritis   Patient is stable Her new issues is cognitive decline Patient working with speech therapy. She gets upset very easily.  She is needing more cueing Still staying independent in her ADLs MMSE done few weeks ago was 25 out of 30  Her weight is stable Walks with her walker No Falls Wt Readings from Last 3 Encounters:  07/18/22 154 lb 3.2 oz (69.9 kg)  07/15/22 154 lb 3.2 oz (69.9 kg)  05/02/22 156 lb 12.8 oz (71.1 kg)    Past Medical History:  Diagnosis Date   Acute blood loss  anemia    Allergy    Asthma    AVM (arteriovenous malformation) of colon with hemorrhage    Bursitis of right hip    Cataract    CKD (chronic kidney disease), stage III ([place]) 11/04/2015   COLONIC POLYPS 03/26/2005   Qualifier: Diagnosis of  By: Talbert Cage CMA (AAMA), June     DIVERTICULOSIS, COLON 03/26/2005   Qualifier: Diagnosis of  By: Talbert Cage CMA Deborra Medina), June     DVT (deep venous thrombosis), left 11/03/2015   Ecchymosis 12/16/2015   Hyperglycemia 12/16/2015   Hyperlipidemia    Hypertension    Multiple skin tears 12/16/2015   Osteopenia    Stasis edema of both lower extremities 11/04/2015   Unstable gait 12/16/2015   Urine incontinence 12/16/2015   Past Surgical History:  Procedure Laterality Date   ABDOMINAL HYSTERECTOMY  2006   Dr. Maryelizabeth Rowan   CATARACT EXTRACTION W/ INTRAOCULAR LENS IMPLANT Bilateral 1999/2003   Dr. Rutherford Guys   COLONOSCOPY N/A 11/06/2015   Procedure: COLONOSCOPY;  Surgeon: Mauri Pole, MD;  Location: [facility];  Service: Endoscopy;  Laterality: N/A;  MAC if available, otherwise moderate sedation    Allergies  Allergen Reactions   Epinephrine Nausea Only    Unknown.   Hydrocodone-Acetaminophen     REACTION: nausea   Labetalol    Procaine    Vitamin D3 [Cholecalciferol]     Outpatient Encounter Medications as of 07/18/2022  Medication  Sig   acetaminophen (TYLENOL) 500 MG tablet Take 500 mg by mouth 2 (two) times daily.    calcium carbonate (TUMS - DOSED IN MG ELEMENTAL CALCIUM) 500 MG chewable tablet Chew 2 tablets by mouth every morning.    cholecalciferol (VITAMIN D3) 25 MCG (1000 UNIT) tablet Take 1,000 Units by mouth daily.   docusate sodium (COLACE) 100 MG capsule Take 100 mg by mouth every morning.   furosemide (LASIX) 20 MG tablet Take 1 tablet (20 mg total) by mouth daily.   gabapentin (NEURONTIN) 300 MG capsule Take 300 mg by mouth 2 (two) times daily.   HYDROcodone-acetaminophen (NORCO/VICODIN) 5-325 MG tablet Take 1 tablet by mouth 2  (two) times daily. Twice a day 8:00 am,8:00 pm   hydrocortisone (PROCTO-MED HC) 2.5 % rectal cream Place 1 Application rectally as needed for hemorrhoids or anal itching. for external rectal hemorrhoids. unsupervised self-administration May self administer   Multiple Vitamins-Minerals (THEREMS-M PO) Take 1 tablet by mouth daily.   nystatin (MYCOSTATIN/NYSTOP) powder Apply to groin 2 times daily as needed   Sodium Fluoride 1.1 % PSTE Place onto teeth daily.   SPIKEVAX syringe Inject 0.5 mLs into the muscle once.   zinc oxide 20 % ointment Apply 1 Application topically as needed for irritation (INCONTINENCE AND REDNESS.).   Multiple Vitamins-Minerals (PRESERVISION AREDS) CAPS Take 1 capsule by mouth in the morning. (Patient not taking: Reported on 07/18/2022)   No facility-administered encounter medications on file as of 07/18/2022.    Review of Systems  Constitutional:  Negative for activity change and appetite change.  HENT: Negative.    Respiratory:  Negative for cough and shortness of breath.   Cardiovascular:  Negative for leg swelling.  Gastrointestinal:  Negative for constipation.  Genitourinary: Negative.   Musculoskeletal:  Negative for arthralgias, gait problem and myalgias.  Skin: Negative.   Neurological:  Negative for dizziness and weakness.  Psychiatric/Behavioral:  Positive for confusion and dysphoric mood. Negative for sleep disturbance.     Immunization History  Administered Date(s) Administered   DTaP 05/13/2005   Fluad Quad(high Dose 65+) 03/06/2022   Influenza, High Dose Seasonal PF 02/23/2019, 02/15/2020   Influenza-Unspecified 02/11/2015, 02/22/2016, 02/19/2017, 02/16/2018, 03/06/2021   Moderna SARS-COV2 Booster Vaccination 10/18/2020   Moderna Sars-Covid-2 Vaccination 05/17/2019, 06/14/2019, 03/27/2020   PFIZER(Purple Top)SARS-COV-2 Vaccination 01/31/2021, 03/19/2022   PPD Test 12/08/2015, 12/22/2015   Pneumococcal Conjugate-13 05/13/2014   Pneumococcal  Polysaccharide-23 05/13/2014, 03/17/2019   Td 12/11/2005   Tdap 01/04/2017   Zoster, Live 05/13/2014   Pertinent  Health Maintenance Due  Topic Date Due   FOOT EXAM  04/26/2022   HEMOGLOBIN A1C  10/25/2022   OPHTHALMOLOGY EXAM  03/01/2023   INFLUENZA VACCINE  Completed   DEXA SCAN  Completed      12/30/2016   12:38 PM 01/05/2018   11:59 AM 07/02/2021    3:54 PM 04/24/2022   11:06 AM 07/15/2022    1:12 PM  Fall Risk  Falls in the past year? No No 0 0 0  Was there an injury with Fall?   0 0 0  Fall Risk Category Calculator   0 0 0  Fall Risk Category (Retired)   Low Low   (RETIRED) Patient Fall Risk Level   Low fall risk Low fall risk   Patient at Risk for Falls Due to   History of fall(s);Impaired balance/gait;Impaired mobility No Fall Risks History of fall(s);Impaired balance/gait;Impaired mobility  Fall risk Follow up   Falls evaluation completed;Education provided Falls evaluation completed Falls  evaluation completed;Falls prevention discussed;Education provided   Functional Status Survey:    Vitals:   07/18/22 0840  BP: 126/88  Pulse: 74  Resp: 18  Temp: 98 F (36.7 C)  TempSrc: Temporal  SpO2: 91%  Weight: 154 lb 3.2 oz (69.9 kg)  Height: '5\' 3"'$  (1.6 m)   Body mass index is 27.32 kg/m. Physical Exam Vitals reviewed.  Constitutional:      Appearance: Normal appearance.  HENT:     Head: Normocephalic.     Nose: Nose normal.     Mouth/Throat:     Mouth: Mucous membranes are moist.     Pharynx: Oropharynx is clear.  Eyes:     Pupils: Pupils are equal, round, and reactive to light.  Cardiovascular:     Rate and Rhythm: Normal rate and regular rhythm.     Pulses: Normal pulses.     Heart sounds: Normal heart sounds. No murmur heard. Pulmonary:     Effort: Pulmonary effort is normal.     Breath sounds: Normal breath sounds.  Abdominal:     General: Abdomen is flat. Bowel sounds are normal.     Palpations: Abdomen is soft.  Musculoskeletal:        General:  No swelling.     Cervical back: Neck supple.  Skin:    General: Skin is warm.  Neurological:     General: No focal deficit present.     Mental Status: She is alert and oriented to person, place, and time.  Psychiatric:        Mood and Affect: Mood normal.        Thought Content: Thought content normal.     Labs reviewed: Recent Labs    11/09/21 0000 12/10/21 0000 04/25/22 0000  NA 140 139 142  K 4.1 4.3 4.3  CL 104 103 103  CO2 29* 30* 28*  BUN 29* 20 32*  CREATININE 1.1 0.9 1.3*  CALCIUM 9.1 9.8 9.2   Recent Labs    11/09/21 0000 12/10/21 0000 04/25/22 0000  AST '22 15 24  '$ ALT '14 9 12  '$ ALKPHOS 38 60 41  ALBUMIN 4.0 3.8 4.0   Recent Labs    11/09/21 0000 04/25/22 0000  WBC 6.0 7.5  NEUTROABS  --  4,320.00  HGB 13.1 12.9  HCT 41 40  PLT  --  238   Lab Results  Component Value Date   TSH 2.25 05/03/2021   Lab Results  Component Value Date   HGBA1C 6.2 04/25/2022   Lab Results  Component Value Date   CHOL 157 12/10/2021   HDL 46 12/10/2021   LDLCALC 90 12/10/2021   TRIG 109 12/10/2021    Significant Diagnostic Results in last 30 days:  No results found.  Assessment/Plan 1. Mild cognitive impairment We did MMSE again with ST She did well Missed in Calculations Got 27/30 and Passed her Clock Sons do not want referral to outside Neurology Do not want her to be started on Aricept Continue in AL  2. Stage 3b chronic kidney disease (HCC) Creat stable  3. Mild depression Will try Lexapro if ok with POA  4. Chronic diastolic congestive heart failure (HCC) On low dose of Lasix  5. Type 2 diabetes mellitus with chronic kidney disease,  A1C good range No Meds  6. Osteoarthritis involving multiple joints on both sides of body Tylenol and Norco   7. Idiopathic peripheral neuropathy Gabapentin    Family/ staff Communication:   Labs/tests ordered:  Total time spent in this patient care encounter was  40_  minutes; greater than 50% of  the visit spent counseling patient and staff, reviewing records , Labs and coordinating care for problems addressed at this encounter.

## 2022-07-24 ENCOUNTER — Telehealth: Payer: Medicare Other | Admitting: Internal Medicine

## 2022-07-24 NOTE — Telephone Encounter (Signed)
Patient called in to inform Dr Lyndel Safe that she got the message yesterday asking if she wanted to see a provider today but did not need to see anyone today about her wound because after looking at it after her bath this morning it seemed to be fine.

## 2022-08-01 DIAGNOSIS — I10 Essential (primary) hypertension: Secondary | ICD-10-CM | POA: Diagnosis not present

## 2022-08-02 LAB — BASIC METABOLIC PANEL
BUN: 29 — AB (ref 4–21)
CO2: 24 — AB (ref 13–22)
Chloride: 106 (ref 99–108)
Creatinine: 1.2 — AB (ref 0.5–1.1)
Glucose: 82
Potassium: 4 mEq/L (ref 3.5–5.1)
Sodium: 142 (ref 137–147)

## 2022-08-02 LAB — COMPREHENSIVE METABOLIC PANEL: Calcium: 8.8 (ref 8.7–10.7)

## 2022-09-11 ENCOUNTER — Other Ambulatory Visit: Payer: Self-pay | Admitting: Orthopedic Surgery

## 2022-09-11 DIAGNOSIS — M159 Polyosteoarthritis, unspecified: Secondary | ICD-10-CM

## 2022-09-11 MED ORDER — HYDROCODONE-ACETAMINOPHEN 5-325 MG PO TABS: 1.0000 | ORAL_TABLET | Freq: Two times a day (BID) | ORAL | 0 refills | Status: DC

## 2022-10-14 DIAGNOSIS — Z961 Presence of intraocular lens: Secondary | ICD-10-CM | POA: Diagnosis not present

## 2022-10-14 DIAGNOSIS — H43813 Vitreous degeneration, bilateral: Secondary | ICD-10-CM | POA: Diagnosis not present

## 2022-10-14 DIAGNOSIS — H524 Presbyopia: Secondary | ICD-10-CM | POA: Diagnosis not present

## 2022-10-15 DIAGNOSIS — H524 Presbyopia: Secondary | ICD-10-CM | POA: Diagnosis not present

## 2022-10-30 ENCOUNTER — Encounter: Payer: Self-pay | Admitting: Orthopedic Surgery

## 2022-10-30 ENCOUNTER — Non-Acute Institutional Stay: Payer: Medicare Other | Admitting: Orthopedic Surgery

## 2022-10-30 DIAGNOSIS — R2681 Unsteadiness on feet: Secondary | ICD-10-CM | POA: Diagnosis not present

## 2022-10-30 DIAGNOSIS — I5032 Chronic diastolic (congestive) heart failure: Secondary | ICD-10-CM

## 2022-10-30 DIAGNOSIS — G3184 Mild cognitive impairment, so stated: Secondary | ICD-10-CM

## 2022-10-30 DIAGNOSIS — E1122 Type 2 diabetes mellitus with diabetic chronic kidney disease: Secondary | ICD-10-CM

## 2022-10-30 DIAGNOSIS — M85852 Other specified disorders of bone density and structure, left thigh: Secondary | ICD-10-CM | POA: Diagnosis not present

## 2022-10-30 DIAGNOSIS — N1832 Chronic kidney disease, stage 3b: Secondary | ICD-10-CM | POA: Diagnosis not present

## 2022-10-30 DIAGNOSIS — F339 Major depressive disorder, recurrent, unspecified: Secondary | ICD-10-CM

## 2022-10-30 DIAGNOSIS — M159 Polyosteoarthritis, unspecified: Secondary | ICD-10-CM | POA: Diagnosis not present

## 2022-10-30 DIAGNOSIS — G609 Hereditary and idiopathic neuropathy, unspecified: Secondary | ICD-10-CM

## 2022-10-30 DIAGNOSIS — M150 Primary generalized (osteo)arthritis: Secondary | ICD-10-CM

## 2022-10-30 NOTE — Progress Notes (Signed)
Location:   [facility] Room Number: 23-A Place of Service:  ALF [phone]) Provider:  Hazle Nordmann, NP  PCP: Mahlon Gammon, MD  Patient Care Team: Mahlon Gammon, MD as PCP - General (Internal Medicine) Jethro Bolus, MD as Consulting Physician (Ophthalmology) Mahlon Gammon, MD as Consulting Physician (Internal Medicine)  Extended Emergency Contact Information Primary Emergency Contact: Tegtmeyer,Leonard Address: [address]          [place], [place] [address] Darden Amber of [place] Home Phone: [phone] Work Phone: [phone] Mobile Phone: [phone] Relation: Son Secondary Emergency Contact: Starling Manns States of [place] Home Phone: [phone] Relation: Friend  Code Status:  DNR Goals of care: Advanced Directive information    10/30/2022   10:47 AM  Advanced Directives  Does Patient Have a Medical Advance Directive? Yes  Type of Estate agent of [place];Living will;Out of facility DNR (pink MOST or yellow form)  Does patient want to make changes to medical advance directive? No - Patient declined  Copy of Healthcare Power of Attorney in Chart? Yes - validated most recent copy scanned in chart (See row information)     Chief Complaint  Patient presents with   Medical Management of Chronic Issues    Routine Visit.    Immunizations    Discuss the need for Shingrix vaccine.    Health Maintenance    Discuss the need for Foot exam, and Hemoglobin A1C.     HPI:  Pt is a 89 y.o. female seen today for medical management of chronic diseases.    She currently resides on the assisted living unit at [facility]. PMH: CHF, PVD,  hx of DVT, diverticulosis, T2DM, OA spine, peripheral neuropathy, and CKD III.    T2DM- A1c 6.2 04/2022, Diabetic eye exam 10/14/2022> no retinopathy, remains on carb modified diet CHF- Echo LVEF 55-60% 2017, remains on furosemide daily CKD 3b- BUN/creat 29/1.2 08/02/2022 Mild  cognitive impairment- no behaviors, more forgetful, some delayed responses, MMSE 24/30 04/2022 OA- involves lower back/hips/hands, right hip most bothersome lately> pain improves with movement/ denies pain to right groin, remains on norco  Unstable gait- ambulates well with walker, no recent falls Osteopenia- DEXA 2015, tscore -2.3, remains on calcium and vitamin D Neuropathy- denies increased pain/numbness, remains on gabapentin  Shingrix vaccine 04/15/2022, unable to determine if 1st or 2nd in [facility].   Scheduled to see AIM Hearing 06/26  Recent blood pressures:  06/01- 142.6 lbs  05/02- 143.8 lbs  04/01- 140.5 lbs   Recent weights:  06/12- 140/62  06/05- 137/63  05/29- 128/57       Past Medical History:  Diagnosis Date   Acute blood loss anemia    Allergy    Asthma    AVM (arteriovenous malformation) of colon with hemorrhage    Bursitis of right hip    Cataract    CKD (chronic kidney disease), stage III (HCC) 11/04/2015   COLONIC POLYPS 03/26/2005   Qualifier: Diagnosis of  By: Glyn Ade CMA (AAMA), June     DIVERTICULOSIS, COLON 03/26/2005   Qualifier: Diagnosis of  By: Glyn Ade CMA Duncan Dull), June     DVT (deep venous thrombosis), left 11/03/2015   Ecchymosis 12/16/2015   Hyperglycemia 12/16/2015   Hyperlipidemia    Hypertension    Multiple skin tears 12/16/2015   Osteopenia    Stasis edema of both lower extremities 11/04/2015   Unstable gait 12/16/2015   Urine incontinence 12/16/2015   Past Surgical History:  Procedure Laterality Date   ABDOMINAL HYSTERECTOMY  2006   Dr. Delia Heady   CATARACT EXTRACTION W/ INTRAOCULAR LENS IMPLANT Bilateral 1999/2003   Dr. Jethro Bolus   COLONOSCOPY N/A 11/06/2015   Procedure: COLONOSCOPY;  Surgeon: Napoleon Form, MD;  Location: [facility];  Service: Endoscopy;  Laterality: N/A;  MAC if available, otherwise moderate sedation    Allergies  Allergen Reactions   Epinephrine Nausea Only    Unknown.   Hydrocodone-Acetaminophen      REACTION: nausea   Labetalol    Procaine    Vitamin D3 [Cholecalciferol]     Allergies as of 10/30/2022       Reactions   Epinephrine Nausea Only   Unknown.   Hydrocodone-acetaminophen    REACTION: nausea   Labetalol    Procaine    Vitamin D3 [cholecalciferol]         Medication List        Accurate as of October 30, 2022 10:47 AM. If you have any questions, ask your nurse or doctor.          STOP taking these medications    Spikevax syringe Generic drug: COVID-19 mRNA vaccine 2023-2024 Stopped by: Octavia Heir, NP       TAKE these medications    acetaminophen 500 MG tablet Commonly known as: TYLENOL Take 500 mg by mouth 2 (two) times daily.   calcium carbonate 500 MG chewable tablet Commonly known as: TUMS - dosed in mg elemental calcium Chew 2 tablets by mouth every morning.   cholecalciferol 25 MCG (1000 UNIT) tablet Commonly known as: VITAMIN D3 Take 1,000 Units by mouth daily.   docusate sodium 100 MG capsule Commonly known as: COLACE Take 100 mg by mouth every morning.   escitalopram 5 MG tablet Commonly known as: LEXAPRO Take 5 mg by mouth daily.   furosemide 20 MG tablet Commonly known as: LASIX Take 1 tablet (20 mg total) by mouth daily.   gabapentin 300 MG capsule Commonly known as: NEURONTIN Take 300 mg by mouth 2 (two) times daily.   HYDROcodone-acetaminophen 5-325 MG tablet Commonly known as: NORCO/VICODIN Take 1 tablet by mouth 2 (two) times daily. Twice a day 8:00 am,8:00 pm   nystatin powder Commonly known as: MYCOSTATIN/NYSTOP Apply to groin 2 times daily as needed   PreserVision AREDS Caps Take 1 capsule by mouth in the morning.   THEREMS-M PO Take 1 tablet by mouth daily.   Procto-Med HC 2.5 % rectal cream Generic drug: hydrocortisone Place 1 Application rectally as needed for hemorrhoids or anal itching. for external rectal hemorrhoids. unsupervised self-administration May self administer   Sodium Fluoride 1.1 %  Pste Place onto teeth daily.   zinc oxide 20 % ointment Apply 1 Application topically as needed for irritation (INCONTINENCE AND REDNESS.).        Review of Systems  Constitutional:  Negative for activity change and appetite change.  HENT:  Positive for hearing loss. Negative for trouble swallowing.   Eyes:  Negative for visual disturbance.  Respiratory:  Negative for cough, shortness of breath and wheezing.   Cardiovascular:  Positive for leg swelling. Negative for chest pain.  Gastrointestinal:  Negative for abdominal distention and abdominal pain.  Genitourinary:  Negative for dysuria, frequency, hematuria and vaginal bleeding.  Musculoskeletal:  Positive for back pain and gait problem.  Skin:  Negative for wound.  Neurological:  Positive for weakness. Negative for dizziness and headaches.  Psychiatric/Behavioral:  Positive for confusion and dysphoric mood. Negative for  sleep disturbance. The patient is not nervous/anxious.     Immunization History  Administered Date(s) Administered   DTaP 05/13/2005   Fluad Quad(high Dose 65+) 03/06/2022   Influenza, High Dose Seasonal PF 02/23/2019, 02/15/2020   Influenza-Unspecified 02/11/2015, 02/22/2016, 02/19/2017, 02/16/2018, 03/06/2021   Moderna SARS-COV2 Booster Vaccination 10/18/2020   Moderna Sars-Covid-2 Vaccination 05/17/2019, 06/14/2019, 03/27/2020   PFIZER(Purple Top)SARS-COV-2 Vaccination 01/31/2021, 03/19/2022   PPD Test 12/08/2015, 12/22/2015   Pneumococcal Conjugate-13 05/13/2014   Pneumococcal Polysaccharide-23 05/13/2014, 03/17/2019   Td 12/11/2005   Tdap 01/04/2017   Zoster, Live 05/13/2014   Pertinent  Health Maintenance Due  Topic Date Due   FOOT EXAM  04/26/2022   HEMOGLOBIN A1C  10/25/2022   INFLUENZA VACCINE  12/12/2022   OPHTHALMOLOGY EXAM  10/14/2023   DEXA SCAN  Completed      12/30/2016   12:38 PM 01/05/2018   11:59 AM 07/02/2021    3:54 PM 04/24/2022   11:06 AM 07/15/2022    1:12 PM  Fall Risk   Falls in the past year? No No 0 0 0  Was there an injury with Fall?   0 0 0  Fall Risk Category Calculator   0 0 0  Fall Risk Category (Retired)   Low Low   (RETIRED) Patient Fall Risk Level   Low fall risk Low fall risk   Patient at Risk for Falls Due to   History of fall(s);Impaired balance/gait;Impaired mobility No Fall Risks History of fall(s);Impaired balance/gait;Impaired mobility  Fall risk Follow up   Falls evaluation completed;Education provided Falls evaluation completed Falls evaluation completed;Falls prevention discussed;Education provided   Functional Status Survey:    Vitals:   10/30/22 1042  BP: 121/63  Pulse: 65  Resp: 18  Temp: (!) 97 F (36.1 C)  SpO2: 90%  Weight: 154 lb (69.9 kg)  Height: 5\' 3"  (1.6 m)   Body mass index is 27.28 kg/m. Physical Exam Vitals reviewed.  Constitutional:      General: She is not in acute distress. HENT:     Head: Normocephalic.     Right Ear: There is no impacted cerumen.     Left Ear: There is no impacted cerumen.     Nose: Nose normal.     Mouth/Throat:     Mouth: Mucous membranes are moist.  Eyes:     General:        Right eye: No discharge.        Left eye: No discharge.  Cardiovascular:     Rate and Rhythm: Normal rate and regular rhythm.     Pulses: Normal pulses.     Heart sounds: Normal heart sounds.  Pulmonary:     Effort: Pulmonary effort is normal. No respiratory distress.     Breath sounds: Normal breath sounds. No wheezing.  Abdominal:     General: Bowel sounds are normal.     Palpations: Abdomen is soft.  Musculoskeletal:     Cervical back: Neck supple.     Right lower leg: Edema present.     Left lower leg: Edema present.     Comments: Non pitting  Skin:    General: Skin is warm.     Capillary Refill: Capillary refill takes less than 2 seconds.  Neurological:     General: No focal deficit present.     Mental Status: She is alert. Mental status is at baseline.     Motor: Weakness present.      Gait: Gait abnormal.     Comments:  walker  Psychiatric:        Mood and Affect: Mood normal.     Comments: Very pleasant, alert to self/person/place, some delayed responses, follows commands     Labs reviewed: Recent Labs    12/10/21 0000 04/25/22 0000 08/02/22 0000  NA 139 142 142  K 4.3 4.3 4.0  CL 103 103 106  CO2 30* 28* 24*  BUN 20 32* 29*  CREATININE 0.9 1.3* 1.2*  CALCIUM 9.8 9.2 8.8   Recent Labs    11/09/21 0000 12/10/21 0000 04/25/22 0000  AST 22 15 24   ALT 14 9 12   ALKPHOS 38 60 41  ALBUMIN 4.0 3.8 4.0   Recent Labs    11/09/21 0000 04/25/22 0000  WBC 6.0 7.5  NEUTROABS  --  4,320.00  HGB 13.1 12.9  HCT 41 40  PLT  --  238   Lab Results  Component Value Date   TSH 2.25 05/03/2021   Lab Results  Component Value Date   HGBA1C 6.2 04/25/2022   Lab Results  Component Value Date   CHOL 157 12/10/2021   HDL 46 12/10/2021   LDLCALC 90 12/10/2021   TRIG 109 12/10/2021    Significant Diagnostic Results in last 30 days:  No results found.  Assessment/Plan 1. Type 2 diabetes mellitus with chronic kidney disease, without long-term current use of insulin, unspecified CKD stage (HCC) - A1c 6.2 - diabetic eye exam 10/14/2022> no retinopathy - foot exam done today - diet controlled - A1c- future  2. Chronic diastolic congestive heart failure (HCC) - compensated - cont furosemide  3. Stage 3b chronic kidney disease (HCC) - encourage hydration with water - avoid NSAIDS  4. Mild cognitive impairment - MMSE 24/30 04/2022 - no behaviors  5. Primary osteoarthritis involving multiple joints - cont Tylenol and norco  6. Unstable gait - no recent falls - ambulates well with walker  7. Osteopenia of neck of left femur - cont Calcium and vitamin D  8. Idiopathic peripheral neuropathy - cont gabapentin   9. Recurrent depression - no mood changes - cont Lexapro   Family/ staff Communication: plan discussed with patient and  nurse  Labs/tests ordered:  A1c

## 2022-10-31 DIAGNOSIS — E1122 Type 2 diabetes mellitus with diabetic chronic kidney disease: Secondary | ICD-10-CM | POA: Diagnosis not present

## 2022-10-31 LAB — HEMOGLOBIN A1C: Hemoglobin A1C: 6

## 2022-12-09 ENCOUNTER — Other Ambulatory Visit: Payer: Self-pay | Admitting: Orthopedic Surgery

## 2022-12-09 DIAGNOSIS — M159 Polyosteoarthritis, unspecified: Secondary | ICD-10-CM

## 2022-12-09 MED ORDER — HYDROCODONE-ACETAMINOPHEN 5-325 MG PO TABS: 1.0000 | ORAL_TABLET | Freq: Two times a day (BID) | ORAL | 0 refills | Status: DC

## 2023-01-21 DIAGNOSIS — M6281 Muscle weakness (generalized): Secondary | ICD-10-CM | POA: Diagnosis not present

## 2023-01-21 DIAGNOSIS — R262 Difficulty in walking, not elsewhere classified: Secondary | ICD-10-CM | POA: Diagnosis not present

## 2023-01-21 DIAGNOSIS — R2681 Unsteadiness on feet: Secondary | ICD-10-CM | POA: Diagnosis not present

## 2023-01-21 DIAGNOSIS — M7071 Other bursitis of hip, right hip: Secondary | ICD-10-CM | POA: Diagnosis not present

## 2023-01-27 DIAGNOSIS — M7071 Other bursitis of hip, right hip: Secondary | ICD-10-CM | POA: Diagnosis not present

## 2023-01-27 DIAGNOSIS — M6281 Muscle weakness (generalized): Secondary | ICD-10-CM | POA: Diagnosis not present

## 2023-01-27 DIAGNOSIS — R2681 Unsteadiness on feet: Secondary | ICD-10-CM | POA: Diagnosis not present

## 2023-01-27 DIAGNOSIS — R262 Difficulty in walking, not elsewhere classified: Secondary | ICD-10-CM | POA: Diagnosis not present

## 2023-01-29 DIAGNOSIS — R262 Difficulty in walking, not elsewhere classified: Secondary | ICD-10-CM | POA: Diagnosis not present

## 2023-01-29 DIAGNOSIS — R2681 Unsteadiness on feet: Secondary | ICD-10-CM | POA: Diagnosis not present

## 2023-01-29 DIAGNOSIS — M6281 Muscle weakness (generalized): Secondary | ICD-10-CM | POA: Diagnosis not present

## 2023-01-29 DIAGNOSIS — M7071 Other bursitis of hip, right hip: Secondary | ICD-10-CM | POA: Diagnosis not present

## 2023-01-31 DIAGNOSIS — R2681 Unsteadiness on feet: Secondary | ICD-10-CM | POA: Diagnosis not present

## 2023-01-31 DIAGNOSIS — M7071 Other bursitis of hip, right hip: Secondary | ICD-10-CM | POA: Diagnosis not present

## 2023-01-31 DIAGNOSIS — R262 Difficulty in walking, not elsewhere classified: Secondary | ICD-10-CM | POA: Diagnosis not present

## 2023-01-31 DIAGNOSIS — M6281 Muscle weakness (generalized): Secondary | ICD-10-CM | POA: Diagnosis not present

## 2023-02-04 DIAGNOSIS — M7071 Other bursitis of hip, right hip: Secondary | ICD-10-CM | POA: Diagnosis not present

## 2023-02-04 DIAGNOSIS — R2681 Unsteadiness on feet: Secondary | ICD-10-CM | POA: Diagnosis not present

## 2023-02-04 DIAGNOSIS — M6281 Muscle weakness (generalized): Secondary | ICD-10-CM | POA: Diagnosis not present

## 2023-02-04 DIAGNOSIS — R262 Difficulty in walking, not elsewhere classified: Secondary | ICD-10-CM | POA: Diagnosis not present

## 2023-02-06 ENCOUNTER — Encounter: Payer: Self-pay | Admitting: Internal Medicine

## 2023-02-06 ENCOUNTER — Non-Acute Institutional Stay: Payer: Medicare Other | Admitting: Internal Medicine

## 2023-02-06 DIAGNOSIS — G609 Hereditary and idiopathic neuropathy, unspecified: Secondary | ICD-10-CM

## 2023-02-06 DIAGNOSIS — G3184 Mild cognitive impairment, so stated: Secondary | ICD-10-CM

## 2023-02-06 DIAGNOSIS — M159 Polyosteoarthritis, unspecified: Secondary | ICD-10-CM

## 2023-02-06 DIAGNOSIS — I5032 Chronic diastolic (congestive) heart failure: Secondary | ICD-10-CM | POA: Diagnosis not present

## 2023-02-06 DIAGNOSIS — E1122 Type 2 diabetes mellitus with diabetic chronic kidney disease: Secondary | ICD-10-CM | POA: Diagnosis not present

## 2023-02-06 DIAGNOSIS — N1832 Chronic kidney disease, stage 3b: Secondary | ICD-10-CM

## 2023-02-06 DIAGNOSIS — F339 Major depressive disorder, recurrent, unspecified: Secondary | ICD-10-CM

## 2023-02-06 NOTE — Progress Notes (Signed)
Location:  [facility] Room Number: AL23A Place of Service:  ALF [phone]) Provider:  Mahlon Gammon, MD  Patient Care Team: Mahlon Gammon, MD as PCP - General (Internal Medicine) Jethro Bolus, MD as Consulting Physician (Ophthalmology) Mahlon Gammon, MD as Consulting Physician (Internal Medicine)  Extended Emergency Contact Information Primary Emergency Contact: Harju,Leonard Address: [address]          [place], [place] [address] Darden Amber of [place] Home Phone: [phone] Work Phone: [phone] Mobile Phone: [phone] Relation: Son Secondary Emergency Contact: Starling Manns [place] of [place] Home Phone: [phone] Relation: Friend  Code Status:  DNR Goals of care: Advanced Directive information    02/06/2023   12:27 PM  Advanced Directives  Does Patient Have a Medical Advance Directive? Yes  Type of Estate agent of [place];Out of facility DNR (pink MOST or yellow form);Living will  Does patient want to make changes to medical advance directive? No - Patient declined  Copy of Healthcare Power of Attorney in Chart? Yes - validated most recent copy scanned in chart (See row information)     Chief Complaint  Patient presents with   Medical Management of Chronic Issues    Medical Management of Chronic Issues.     HPI:  Pt is a 89 y.o. female seen today for medical management of chronic diseases.   Lives in AL in [facility]   Patient has h/o Hypertension, Diabetes Mellitus, H/o CKD,, Low back Pain and Arthritis Mild Cognitive issues Mild Depression   Patient is stable Doing well Walks with her Walker No Recent Behaviors Wt Readings from Last 3 Encounters:  02/06/23 151 lb (68.5 kg)  10/30/22 154 lb (69.9 kg)  07/18/22 154 lb 3.2 oz (69.9 kg)    No Falls Past Medical History:  Diagnosis Date   Acute blood loss anemia    Allergy    Asthma    AVM (arteriovenous malformation) of colon with  hemorrhage    Bursitis of right hip    Cataract    CKD (chronic kidney disease), stage III (HCC) 11/04/2015   COLONIC POLYPS 03/26/2005   Qualifier: Diagnosis of  By: Glyn Ade CMA (AAMA), June     DIVERTICULOSIS, COLON 03/26/2005   Qualifier: Diagnosis of  By: Glyn Ade CMA Duncan Dull), June     DVT (deep venous thrombosis), left 11/03/2015   Ecchymosis 12/16/2015   Hyperglycemia 12/16/2015   Hyperlipidemia    Hypertension    Multiple skin tears 12/16/2015   Osteopenia    Stasis edema of both lower extremities 11/04/2015   Unstable gait 12/16/2015   Urine incontinence 12/16/2015   Past Surgical History:  Procedure Laterality Date   ABDOMINAL HYSTERECTOMY  2006   Dr. Delia Heady   CATARACT EXTRACTION W/ INTRAOCULAR LENS IMPLANT Bilateral 1999/2003   Dr. Jethro Bolus   COLONOSCOPY N/A 11/06/2015   Procedure: COLONOSCOPY;  Surgeon: Napoleon Form, MD;  Location: [facility];  Service: Endoscopy;  Laterality: N/A;  MAC if available, otherwise moderate sedation    Allergies  Allergen Reactions   Epinephrine Nausea Only    Unknown.   Hydrocodone-Acetaminophen     REACTION: nausea   Labetalol    Procaine    Vitamin D3 [Cholecalciferol]     Outpatient Encounter Medications as of 02/06/2023  Medication Sig   acetaminophen (TYLENOL) 500 MG tablet Take 500 mg by mouth 2 (two) times daily.    calcium carbonate (TUMS - DOSED IN MG ELEMENTAL CALCIUM) 500 MG chewable  tablet Chew 2 tablets by mouth every morning.    cholecalciferol (VITAMIN D3) 25 MCG (1000 UNIT) tablet Take 1,000 Units by mouth daily.   docusate sodium (COLACE) 100 MG capsule Take 100 mg by mouth every morning.   escitalopram (LEXAPRO) 5 MG tablet Take 5 mg by mouth daily.   furosemide (LASIX) 20 MG tablet Take 1 tablet (20 mg total) by mouth daily.   gabapentin (NEURONTIN) 300 MG capsule Take 300 mg by mouth 2 (two) times daily.   HYDROcodone-acetaminophen (NORCO/VICODIN) 5-325 MG tablet Take 1 tablet by mouth 2 (two) times daily.  Twice a day 8:00 am,8:00 pm   hydrocortisone (PROCTO-MED HC) 2.5 % rectal cream Place 1 Application rectally as needed for hemorrhoids or anal itching. for external rectal hemorrhoids. unsupervised self-administration May self administer   Multiple Vitamins-Minerals (PRESERVISION AREDS) CAPS Take 1 capsule by mouth in the morning.   Multiple Vitamins-Minerals (THEREMS-M PO) Take 1 tablet by mouth daily.   nystatin (MYCOSTATIN/NYSTOP) powder Apply to groin 2 times daily as needed   Sodium Fluoride 1.1 % PSTE Place onto teeth daily.   zinc oxide 20 % ointment Apply 1 Application topically as needed for irritation (INCONTINENCE AND REDNESS.).   No facility-administered encounter medications on file as of 02/06/2023.    Review of Systems  Constitutional:  Negative for activity change and appetite change.  HENT: Negative.    Respiratory:  Negative for cough and shortness of breath.   Cardiovascular:  Positive for leg swelling.  Gastrointestinal:  Negative for constipation.  Genitourinary: Negative.   Musculoskeletal:  Positive for arthralgias and gait problem. Negative for myalgias.  Skin: Negative.   Neurological:  Negative for dizziness and weakness.  Psychiatric/Behavioral:  Positive for confusion. Negative for dysphoric mood and sleep disturbance.     Immunization History  Administered Date(s) Administered   DTaP 05/13/2005   Fluad Quad(high Dose 65+) 03/06/2022   Influenza, High Dose Seasonal PF 02/23/2019, 02/15/2020   Influenza-Unspecified 02/11/2015, 02/22/2016, 02/19/2017, 02/16/2018, 03/06/2021   Moderna SARS-COV2 Booster Vaccination 10/18/2020   Moderna Sars-Covid-2 Vaccination 05/17/2019, 06/14/2019, 03/27/2020   PFIZER(Purple Top)SARS-COV-2 Vaccination 01/31/2021, 03/19/2022   PPD Test 12/08/2015, 12/22/2015   Pneumococcal Conjugate-13 05/13/2014   Pneumococcal Polysaccharide-23 05/13/2014, 03/17/2019   RSV,unspecified 05/02/2022   Td 12/11/2005   Tdap 01/04/2017    Zoster Recombinant(Shingrix) 04/15/2022   Zoster, Live 05/13/2014   Pertinent  Health Maintenance Due  Topic Date Due   INFLUENZA VACCINE  12/12/2022   HEMOGLOBIN A1C  05/02/2023   OPHTHALMOLOGY EXAM  10/14/2023   FOOT EXAM  10/30/2023   DEXA SCAN  Completed      01/05/2018   11:59 AM 07/02/2021    3:54 PM 04/24/2022   11:06 AM 07/15/2022    1:12 PM 10/30/2022    3:14 PM  Fall Risk  Falls in the past year? No 0 0 0 0  Was there an injury with Fall?  0 0 0 0  Fall Risk Category Calculator  0 0 0 0  Fall Risk Category (Retired)  Low Low    (RETIRED) Patient Fall Risk Level  Low fall risk Low fall risk    Patient at Risk for Falls Due to  History of fall(s);Impaired balance/gait;Impaired mobility No Fall Risks History of fall(s);Impaired balance/gait;Impaired mobility Impaired balance/gait;Impaired mobility  Fall risk Follow up  Falls evaluation completed;Education provided Falls evaluation completed Falls evaluation completed;Falls prevention discussed;Education provided Falls evaluation completed;Education provided;Falls prevention discussed   Functional Status Survey:    Vitals:   02/06/23  1223  BP: (!) 107/56  Pulse: 60  Resp: 18  Temp: 98.9 F (37.2 C)  SpO2: 94%  Weight: 151 lb (68.5 kg)  Height: 5\' 3"  (1.6 m)   Body mass index is 26.75 kg/m. Physical Exam Vitals reviewed.  Constitutional:      Appearance: Normal appearance.  HENT:     Head: Normocephalic.     Nose: Nose normal.     Mouth/Throat:     Mouth: Mucous membranes are moist.     Pharynx: Oropharynx is clear.  Eyes:     Pupils: Pupils are equal, round, and reactive to light.  Cardiovascular:     Rate and Rhythm: Normal rate and regular rhythm.     Pulses: Normal pulses.     Heart sounds: Normal heart sounds. No murmur heard. Pulmonary:     Effort: Pulmonary effort is normal.     Breath sounds: Normal breath sounds.  Abdominal:     General: Abdomen is flat. Bowel sounds are normal.     Palpations:  Abdomen is soft.  Musculoskeletal:        General: Swelling present.     Cervical back: Neck supple.  Skin:    General: Skin is warm.  Neurological:     General: No focal deficit present.     Mental Status: She is alert and oriented to person, place, and time.  Psychiatric:        Mood and Affect: Mood normal.        Thought Content: Thought content normal.     Labs reviewed: Recent Labs    04/25/22 0000 08/02/22 0000  NA 142 142  K 4.3 4.0  CL 103 106  CO2 28* 24*  BUN 32* 29*  CREATININE 1.3* 1.2*  CALCIUM 9.2 8.8   Recent Labs    04/25/22 0000  AST 24  ALT 12  ALKPHOS 41  ALBUMIN 4.0   Recent Labs    04/25/22 0000  WBC 7.5  NEUTROABS 4,320.00  HGB 12.9  HCT 40  PLT 238   Lab Results  Component Value Date   TSH 2.25 05/03/2021   Lab Results  Component Value Date   HGBA1C 6.0 10/31/2022   Lab Results  Component Value Date   CHOL 157 12/10/2021   HDL 46 12/10/2021   LDLCALC 90 12/10/2021   TRIG 109 12/10/2021    Significant Diagnostic Results in last 30 days:  No results found.  Assessment/Plan 1. Osteoarthritis involving multiple joints on both sides of body Stable with Low dose Norco Walks with her Walker  2. Stage 3b chronic kidney disease (HCC) Creat stable  3. Mild cognitive impairment 27/30 and Passed her Clock [employer] do not want referral to outside Neurology Do not want her to be started on Aricept Continue in AL  4. Type 2 diabetes mellitus with chronic kidney disease, without long-term current use of insulin, unspecified CKD stage (HCC) No Meds  5. Chronic diastolic congestive heart failure (HCC) Low dose of lasix  6. Idiopathic peripheral neuropathy Gabapentin  7. Recurrent depression (HCC) Lexapro has helped    Family/ staff Communication:   Labs/tests ordered:

## 2023-02-07 ENCOUNTER — Other Ambulatory Visit: Payer: Self-pay | Admitting: Orthopedic Surgery

## 2023-02-07 DIAGNOSIS — M7071 Other bursitis of hip, right hip: Secondary | ICD-10-CM | POA: Diagnosis not present

## 2023-02-07 DIAGNOSIS — M159 Polyosteoarthritis, unspecified: Secondary | ICD-10-CM

## 2023-02-07 DIAGNOSIS — R262 Difficulty in walking, not elsewhere classified: Secondary | ICD-10-CM | POA: Diagnosis not present

## 2023-02-07 DIAGNOSIS — M6281 Muscle weakness (generalized): Secondary | ICD-10-CM | POA: Diagnosis not present

## 2023-02-07 DIAGNOSIS — R2681 Unsteadiness on feet: Secondary | ICD-10-CM | POA: Diagnosis not present

## 2023-02-07 MED ORDER — HYDROCODONE-ACETAMINOPHEN 5-325 MG PO TABS: 1.0000 | ORAL_TABLET | Freq: Two times a day (BID) | ORAL | 0 refills | Status: DC

## 2023-02-11 DIAGNOSIS — M6281 Muscle weakness (generalized): Secondary | ICD-10-CM | POA: Diagnosis not present

## 2023-02-11 DIAGNOSIS — R2681 Unsteadiness on feet: Secondary | ICD-10-CM | POA: Diagnosis not present

## 2023-02-11 DIAGNOSIS — M7071 Other bursitis of hip, right hip: Secondary | ICD-10-CM | POA: Diagnosis not present

## 2023-02-11 DIAGNOSIS — R262 Difficulty in walking, not elsewhere classified: Secondary | ICD-10-CM | POA: Diagnosis not present

## 2023-02-13 DIAGNOSIS — M6281 Muscle weakness (generalized): Secondary | ICD-10-CM | POA: Diagnosis not present

## 2023-02-13 DIAGNOSIS — R262 Difficulty in walking, not elsewhere classified: Secondary | ICD-10-CM | POA: Diagnosis not present

## 2023-02-13 DIAGNOSIS — R2681 Unsteadiness on feet: Secondary | ICD-10-CM | POA: Diagnosis not present

## 2023-02-13 DIAGNOSIS — M7071 Other bursitis of hip, right hip: Secondary | ICD-10-CM | POA: Diagnosis not present

## 2023-02-18 DIAGNOSIS — M7071 Other bursitis of hip, right hip: Secondary | ICD-10-CM | POA: Diagnosis not present

## 2023-02-18 DIAGNOSIS — R262 Difficulty in walking, not elsewhere classified: Secondary | ICD-10-CM | POA: Diagnosis not present

## 2023-02-18 DIAGNOSIS — M6281 Muscle weakness (generalized): Secondary | ICD-10-CM | POA: Diagnosis not present

## 2023-02-18 DIAGNOSIS — R2681 Unsteadiness on feet: Secondary | ICD-10-CM | POA: Diagnosis not present

## 2023-02-20 DIAGNOSIS — M6281 Muscle weakness (generalized): Secondary | ICD-10-CM | POA: Diagnosis not present

## 2023-02-20 DIAGNOSIS — R2681 Unsteadiness on feet: Secondary | ICD-10-CM | POA: Diagnosis not present

## 2023-02-20 DIAGNOSIS — R262 Difficulty in walking, not elsewhere classified: Secondary | ICD-10-CM | POA: Diagnosis not present

## 2023-02-20 DIAGNOSIS — M7071 Other bursitis of hip, right hip: Secondary | ICD-10-CM | POA: Diagnosis not present

## 2023-02-25 DIAGNOSIS — R2681 Unsteadiness on feet: Secondary | ICD-10-CM | POA: Diagnosis not present

## 2023-02-25 DIAGNOSIS — M7071 Other bursitis of hip, right hip: Secondary | ICD-10-CM | POA: Diagnosis not present

## 2023-02-25 DIAGNOSIS — M6281 Muscle weakness (generalized): Secondary | ICD-10-CM | POA: Diagnosis not present

## 2023-02-25 DIAGNOSIS — R262 Difficulty in walking, not elsewhere classified: Secondary | ICD-10-CM | POA: Diagnosis not present

## 2023-02-27 DIAGNOSIS — R262 Difficulty in walking, not elsewhere classified: Secondary | ICD-10-CM | POA: Diagnosis not present

## 2023-02-27 DIAGNOSIS — R2681 Unsteadiness on feet: Secondary | ICD-10-CM | POA: Diagnosis not present

## 2023-02-27 DIAGNOSIS — M6281 Muscle weakness (generalized): Secondary | ICD-10-CM | POA: Diagnosis not present

## 2023-02-27 DIAGNOSIS — M7071 Other bursitis of hip, right hip: Secondary | ICD-10-CM | POA: Diagnosis not present

## 2023-03-04 DIAGNOSIS — M7071 Other bursitis of hip, right hip: Secondary | ICD-10-CM | POA: Diagnosis not present

## 2023-03-04 DIAGNOSIS — R262 Difficulty in walking, not elsewhere classified: Secondary | ICD-10-CM | POA: Diagnosis not present

## 2023-03-04 DIAGNOSIS — M6281 Muscle weakness (generalized): Secondary | ICD-10-CM | POA: Diagnosis not present

## 2023-03-04 DIAGNOSIS — R2681 Unsteadiness on feet: Secondary | ICD-10-CM | POA: Diagnosis not present

## 2023-03-06 DIAGNOSIS — R262 Difficulty in walking, not elsewhere classified: Secondary | ICD-10-CM | POA: Diagnosis not present

## 2023-03-06 DIAGNOSIS — M7071 Other bursitis of hip, right hip: Secondary | ICD-10-CM | POA: Diagnosis not present

## 2023-03-06 DIAGNOSIS — R2681 Unsteadiness on feet: Secondary | ICD-10-CM | POA: Diagnosis not present

## 2023-03-06 DIAGNOSIS — M6281 Muscle weakness (generalized): Secondary | ICD-10-CM | POA: Diagnosis not present

## 2023-03-11 ENCOUNTER — Other Ambulatory Visit: Payer: Self-pay | Admitting: Orthopedic Surgery

## 2023-03-11 DIAGNOSIS — R262 Difficulty in walking, not elsewhere classified: Secondary | ICD-10-CM | POA: Diagnosis not present

## 2023-03-11 DIAGNOSIS — R2681 Unsteadiness on feet: Secondary | ICD-10-CM | POA: Diagnosis not present

## 2023-03-11 DIAGNOSIS — M7071 Other bursitis of hip, right hip: Secondary | ICD-10-CM | POA: Diagnosis not present

## 2023-03-11 DIAGNOSIS — M6281 Muscle weakness (generalized): Secondary | ICD-10-CM | POA: Diagnosis not present

## 2023-03-11 DIAGNOSIS — M159 Polyosteoarthritis, unspecified: Secondary | ICD-10-CM

## 2023-03-11 MED ORDER — HYDROCODONE-ACETAMINOPHEN 5-325 MG PO TABS: 1.0000 | ORAL_TABLET | Freq: Two times a day (BID) | ORAL | 0 refills | Status: DC

## 2023-03-13 DIAGNOSIS — R262 Difficulty in walking, not elsewhere classified: Secondary | ICD-10-CM | POA: Diagnosis not present

## 2023-03-13 DIAGNOSIS — M6281 Muscle weakness (generalized): Secondary | ICD-10-CM | POA: Diagnosis not present

## 2023-03-13 DIAGNOSIS — R2681 Unsteadiness on feet: Secondary | ICD-10-CM | POA: Diagnosis not present

## 2023-03-13 DIAGNOSIS — M7071 Other bursitis of hip, right hip: Secondary | ICD-10-CM | POA: Diagnosis not present

## 2023-03-18 DIAGNOSIS — M7071 Other bursitis of hip, right hip: Secondary | ICD-10-CM | POA: Diagnosis not present

## 2023-03-18 DIAGNOSIS — M6281 Muscle weakness (generalized): Secondary | ICD-10-CM | POA: Diagnosis not present

## 2023-03-18 DIAGNOSIS — R262 Difficulty in walking, not elsewhere classified: Secondary | ICD-10-CM | POA: Diagnosis not present

## 2023-03-18 DIAGNOSIS — R2681 Unsteadiness on feet: Secondary | ICD-10-CM | POA: Diagnosis not present

## 2023-03-20 DIAGNOSIS — R262 Difficulty in walking, not elsewhere classified: Secondary | ICD-10-CM | POA: Diagnosis not present

## 2023-03-20 DIAGNOSIS — M7071 Other bursitis of hip, right hip: Secondary | ICD-10-CM | POA: Diagnosis not present

## 2023-03-20 DIAGNOSIS — M6281 Muscle weakness (generalized): Secondary | ICD-10-CM | POA: Diagnosis not present

## 2023-03-20 DIAGNOSIS — R2681 Unsteadiness on feet: Secondary | ICD-10-CM | POA: Diagnosis not present

## 2023-03-25 DIAGNOSIS — R262 Difficulty in walking, not elsewhere classified: Secondary | ICD-10-CM | POA: Diagnosis not present

## 2023-03-25 DIAGNOSIS — R2681 Unsteadiness on feet: Secondary | ICD-10-CM | POA: Diagnosis not present

## 2023-03-25 DIAGNOSIS — M6281 Muscle weakness (generalized): Secondary | ICD-10-CM | POA: Diagnosis not present

## 2023-03-25 DIAGNOSIS — M7071 Other bursitis of hip, right hip: Secondary | ICD-10-CM | POA: Diagnosis not present

## 2023-03-27 DIAGNOSIS — M7071 Other bursitis of hip, right hip: Secondary | ICD-10-CM | POA: Diagnosis not present

## 2023-03-27 DIAGNOSIS — M6281 Muscle weakness (generalized): Secondary | ICD-10-CM | POA: Diagnosis not present

## 2023-03-27 DIAGNOSIS — R262 Difficulty in walking, not elsewhere classified: Secondary | ICD-10-CM | POA: Diagnosis not present

## 2023-03-27 DIAGNOSIS — R2681 Unsteadiness on feet: Secondary | ICD-10-CM | POA: Diagnosis not present

## 2023-04-01 DIAGNOSIS — R262 Difficulty in walking, not elsewhere classified: Secondary | ICD-10-CM | POA: Diagnosis not present

## 2023-04-01 DIAGNOSIS — M6281 Muscle weakness (generalized): Secondary | ICD-10-CM | POA: Diagnosis not present

## 2023-04-01 DIAGNOSIS — M7071 Other bursitis of hip, right hip: Secondary | ICD-10-CM | POA: Diagnosis not present

## 2023-04-01 DIAGNOSIS — R2681 Unsteadiness on feet: Secondary | ICD-10-CM | POA: Diagnosis not present

## 2023-04-03 DIAGNOSIS — R262 Difficulty in walking, not elsewhere classified: Secondary | ICD-10-CM | POA: Diagnosis not present

## 2023-04-03 DIAGNOSIS — M7071 Other bursitis of hip, right hip: Secondary | ICD-10-CM | POA: Diagnosis not present

## 2023-04-03 DIAGNOSIS — M6281 Muscle weakness (generalized): Secondary | ICD-10-CM | POA: Diagnosis not present

## 2023-04-03 DIAGNOSIS — R2681 Unsteadiness on feet: Secondary | ICD-10-CM | POA: Diagnosis not present

## 2023-04-08 ENCOUNTER — Other Ambulatory Visit: Payer: Self-pay | Admitting: Adult Health

## 2023-04-08 DIAGNOSIS — M159 Polyosteoarthritis, unspecified: Secondary | ICD-10-CM

## 2023-04-08 MED ORDER — HYDROCODONE-ACETAMINOPHEN 5-325 MG PO TABS: 1.0000 | ORAL_TABLET | Freq: Two times a day (BID) | ORAL | 0 refills | Status: DC

## 2023-04-15 ENCOUNTER — Encounter: Payer: Self-pay | Admitting: Orthopedic Surgery

## 2023-04-15 ENCOUNTER — Non-Acute Institutional Stay: Payer: Medicare Other | Admitting: Orthopedic Surgery

## 2023-04-15 DIAGNOSIS — I5032 Chronic diastolic (congestive) heart failure: Secondary | ICD-10-CM | POA: Diagnosis not present

## 2023-04-15 DIAGNOSIS — G3184 Mild cognitive impairment, so stated: Secondary | ICD-10-CM | POA: Diagnosis not present

## 2023-04-15 DIAGNOSIS — G609 Hereditary and idiopathic neuropathy, unspecified: Secondary | ICD-10-CM

## 2023-04-15 DIAGNOSIS — E1122 Type 2 diabetes mellitus with diabetic chronic kidney disease: Secondary | ICD-10-CM | POA: Diagnosis not present

## 2023-04-15 DIAGNOSIS — M6281 Muscle weakness (generalized): Secondary | ICD-10-CM | POA: Diagnosis not present

## 2023-04-15 DIAGNOSIS — R262 Difficulty in walking, not elsewhere classified: Secondary | ICD-10-CM | POA: Diagnosis not present

## 2023-04-15 DIAGNOSIS — N1832 Chronic kidney disease, stage 3b: Secondary | ICD-10-CM | POA: Diagnosis not present

## 2023-04-15 DIAGNOSIS — N1831 Chronic kidney disease, stage 3a: Secondary | ICD-10-CM | POA: Diagnosis not present

## 2023-04-15 DIAGNOSIS — R2681 Unsteadiness on feet: Secondary | ICD-10-CM

## 2023-04-15 DIAGNOSIS — M159 Polyosteoarthritis, unspecified: Secondary | ICD-10-CM | POA: Diagnosis not present

## 2023-04-15 DIAGNOSIS — M7071 Other bursitis of hip, right hip: Secondary | ICD-10-CM | POA: Diagnosis not present

## 2023-04-15 DIAGNOSIS — M85852 Other specified disorders of bone density and structure, left thigh: Secondary | ICD-10-CM | POA: Diagnosis not present

## 2023-04-15 DIAGNOSIS — F339 Major depressive disorder, recurrent, unspecified: Secondary | ICD-10-CM

## 2023-04-15 NOTE — Progress Notes (Signed)
Location:  [facility] Room Number: 23-A Place of Service:  ALF [phone]) Provider:  Milany Geck Roland Earl, MD  Patient Care Team: Mahlon Gammon, MD as PCP - General (Internal Medicine) Jethro Bolus, MD as Consulting Physician (Ophthalmology) Mahlon Gammon, MD as Consulting Physician (Internal Medicine)  Extended Emergency Contact Information Primary Emergency Contact: Huether,Leonard Address: [address]          [place], [place] [address] Darden Amber of [place] Home Phone: [phone] Work Phone: [phone] Mobile Phone: [phone] Relation: Son Secondary Emergency Contact: Starling Manns [place] Home Phone: [phone] Relation: Friend  Code Status:  DNR Goals of care: Advanced Directive information    04/15/2023   10:59 AM  Advanced Directives  Does Patient Have a Medical Advance Directive? Yes  Type of Estate agent of [place];Living will;Out of facility DNR (pink MOST or yellow form)  Does patient want to make changes to medical advance directive? No - Patient declined  Copy of Healthcare Power of Attorney in Chart? Yes - validated most recent copy scanned in chart (See row information)  Pre-existing out of facility DNR order (yellow form or pink MOST form) Pink MOST/Yellow Form most recent copy in chart - Physician notified to receive inpatient order     Chief Complaint  Patient presents with   Routine    HPI:  Pt is a 89 y.o. female seen today for medical management of chronic diseases.    She currently resides on the assisted living unit at [facility]. PMH: CHF, PVD,  hx of DVT, diverticulosis, T2DM, OA spine, peripheral neuropathy, and CKD III.    Mild cognitive impairment- no behaviors, more forgetful and having to write reminders to self, some delayed responses and recall today, MMSE 24/30 04/2022, BIMS 15/15 11/2022 T2DM- A1c 6.0 (10/2022)> was 6.2 04/2022, Diabetic  eye exam 10/14/2022> no retinopathy, remains on carb modified diet CHF- Echo LVEF 55-60% 2017, remains on furosemide daily CKD- BUN/creat 29/1.17, GFR 43 08/02/2022 OA- involves lower back/hips/hands, remains on norco  Unstable gait- ambulates well with walker, no recent falls Osteopenia- DEXA 2015, tscore -2.3, remains on calcium and vitamin D Neuropathy- remains on gabapentin Depression-Na+ 142 08/01/2022, remains on Lexapro  No recent falls or injuries.   Recent weights:  12/01- 150.6 lbs  11/01- 150.2 lbs  10/01- 151 lbs  Recent blood pressures:  11/27- 119/51  11/20- 125/80  11/13- 113/61   Past Medical History:  Diagnosis Date   Acute blood loss anemia    Allergy    Asthma    AVM (arteriovenous malformation) of colon with hemorrhage    Bursitis of right hip    Cataract    CKD (chronic kidney disease), stage III (HCC) 11/04/2015   COLONIC POLYPS 03/26/2005   Qualifier: Diagnosis of  By: Glyn Ade CMA (AAMA), June     DIVERTICULOSIS, COLON 03/26/2005   Qualifier: Diagnosis of  By: Glyn Ade CMA (AAMA), June     DVT (deep venous thrombosis), left 11/03/2015   Ecchymosis 12/16/2015   Hyperglycemia 12/16/2015   Hyperlipidemia    Hypertension    Multiple skin tears 12/16/2015   Osteopenia    Stasis edema of both lower extremities 11/04/2015   Unstable gait 12/16/2015   Urine incontinence 12/16/2015   Past Surgical History:  Procedure Laterality Date   ABDOMINAL HYSTERECTOMY  2006   Dr. Delia Heady   CATARACT EXTRACTION W/ INTRAOCULAR LENS IMPLANT Bilateral 1999/2003   Dr. Jethro Bolus  COLONOSCOPY N/A 11/06/2015   Procedure: COLONOSCOPY;  Surgeon: Napoleon Form, MD;  Location: [facility];  Service: Endoscopy;  Laterality: N/A;  MAC if available, otherwise moderate sedation    Allergies  Allergen Reactions   Epinephrine Nausea Only    Unknown.   Hydrocodone-Acetaminophen     REACTION: nausea   Labetalol    Procaine    Vitamin D3 [Cholecalciferol]     Outpatient  Encounter Medications as of 04/15/2023  Medication Sig   acetaminophen (TYLENOL) 500 MG tablet Take 500 mg by mouth 2 (two) times daily.    calcium carbonate (TUMS - DOSED IN MG ELEMENTAL CALCIUM) 500 MG chewable tablet Chew 2 tablets by mouth every morning.    cholecalciferol (VITAMIN D3) 25 MCG (1000 UNIT) tablet Take 1,000 Units by mouth daily.   docusate sodium (COLACE) 100 MG capsule Take 100 mg by mouth every morning.   escitalopram (LEXAPRO) 5 MG tablet Take 5 mg by mouth daily.   furosemide (LASIX) 20 MG tablet Take 1 tablet (20 mg total) by mouth daily.   gabapentin (NEURONTIN) 300 MG capsule Take 300 mg by mouth 2 (two) times daily.   HYDROcodone-acetaminophen (NORCO/VICODIN) 5-325 MG tablet Take 1 tablet by mouth 2 (two) times daily. Twice a day 8:00 am,8:00 pm   hydrocortisone (PROCTO-MED HC) 2.5 % rectal cream Place 1 Application rectally as needed for hemorrhoids or anal itching. for external rectal hemorrhoids. unsupervised self-administration May self administer   Multiple Vitamins-Minerals (PRESERVISION AREDS) CAPS Take 1 capsule by mouth in the morning.   Multiple Vitamins-Minerals (THEREMS-M PO) Take 1 tablet by mouth daily.   nystatin (MYCOSTATIN/NYSTOP) powder Apply to groin 2 times daily as needed   Sodium Fluoride 1.1 % PSTE Place onto teeth daily.   zinc oxide 20 % ointment Apply 1 Application topically as needed for irritation (INCONTINENCE AND REDNESS.).   No facility-administered encounter medications on file as of 04/15/2023.    Review of Systems  Constitutional:  Negative for activity change and appetite change.  HENT:  Positive for hearing loss. Negative for sore throat and trouble swallowing.   Eyes:  Negative for visual disturbance.  Respiratory:  Negative for cough, shortness of breath and wheezing.   Cardiovascular:  Negative for chest pain and leg swelling.  Gastrointestinal:  Negative for abdominal distention and abdominal pain.  Genitourinary:  Negative  for dysuria, frequency and hematuria.  Musculoskeletal:  Positive for arthralgias and gait problem.  Skin:  Negative for wound.  Neurological:  Positive for weakness. Negative for dizziness and light-headedness.  Psychiatric/Behavioral:  Positive for confusion. Negative for dysphoric mood and sleep disturbance. The patient is not nervous/anxious.     Immunization History  Administered Date(s) Administered   DTaP 05/13/2005   Fluad Quad(high Dose 65+) 03/06/2022   Influenza, High Dose Seasonal PF 02/23/2019, 02/15/2020   Influenza-Unspecified 02/11/2015, 02/22/2016, 02/19/2017, 02/16/2018, 03/06/2021   Moderna SARS-COV2 Booster Vaccination 10/18/2020   Moderna Sars-Covid-2 Vaccination 05/17/2019, 06/14/2019, 03/27/2020   PFIZER(Purple Top)SARS-COV-2 Vaccination 01/31/2021, 03/19/2022   PPD Test 12/08/2015, 12/22/2015   Pneumococcal Conjugate-13 05/13/2014   Pneumococcal Polysaccharide-23 05/13/2014, 03/17/2019   RSV,unspecified 05/02/2022   Td 12/11/2005   Tdap 01/04/2017   Zoster Recombinant(Shingrix) 04/15/2022   Zoster, Live 05/13/2014   Pertinent  Health Maintenance Due  Topic Date Due   INFLUENZA VACCINE  12/12/2022   HEMOGLOBIN A1C  05/02/2023   OPHTHALMOLOGY EXAM  10/14/2023   FOOT EXAM  10/30/2023   DEXA SCAN  Completed      01/05/2018  11:59 AM 07/02/2021    3:54 PM 04/24/2022   11:06 AM 07/15/2022    1:12 PM 10/30/2022    3:14 PM  Fall Risk  Falls in the past year? No 0 0 0 0  Was there an injury with Fall?  0 0 0 0  Fall Risk Category Calculator  0 0 0 0  Fall Risk Category (Retired)  Low Low    (RETIRED) Patient Fall Risk Level  Low fall risk Low fall risk    Patient at Risk for Falls Due to  History of fall(s);Impaired balance/gait;Impaired mobility No Fall Risks History of fall(s);Impaired balance/gait;Impaired mobility Impaired balance/gait;Impaired mobility  Fall risk Follow up  Falls evaluation completed;Education provided Falls evaluation completed Falls  evaluation completed;Falls prevention discussed;Education provided Falls evaluation completed;Education provided;Falls prevention discussed   Functional Status Survey:    Vitals:   04/15/23 1051  BP: (!) 119/51  Pulse: 66  Resp: 15  Temp: 97.7 F (36.5 C)  SpO2: 96%  Weight: 150 lb 9.6 oz (68.3 kg)  Height: 5\' 3"  (1.6 m)   Body mass index is 26.68 kg/m. Physical Exam Vitals reviewed.  Constitutional:      General: She is not in acute distress. HENT:     Head: Normocephalic.     Right Ear: There is no impacted cerumen.     Left Ear: There is no impacted cerumen.     Nose: Nose normal.     Mouth/Throat:     Mouth: Mucous membranes are moist.  Eyes:     General:        Right eye: No discharge.        Left eye: No discharge.  Cardiovascular:     Rate and Rhythm: Normal rate and regular rhythm.     Pulses: Normal pulses.     Heart sounds: Normal heart sounds.  Pulmonary:     Effort: Pulmonary effort is normal. No respiratory distress.     Breath sounds: Normal breath sounds. No wheezing.  Abdominal:     General: Bowel sounds are normal. There is no distension.     Palpations: Abdomen is soft.     Tenderness: There is no abdominal tenderness.  Musculoskeletal:     Cervical back: Neck supple.     Right lower leg: Edema present.     Left lower leg: Edema present.     Comments: Non pitting  Skin:    General: Skin is warm.     Capillary Refill: Capillary refill takes less than 2 seconds.  Neurological:     General: No focal deficit present.     Mental Status: She is alert. Mental status is at baseline.     Motor: Weakness present.     Gait: Gait abnormal.     Comments: walker  Psychiatric:        Mood and Affect: Mood normal.     Labs reviewed: Recent Labs    04/25/22 0000 08/02/22 0000  NA 142 142  K 4.3 4.0  CL 103 106  CO2 28* 24*  BUN 32* 29*  CREATININE 1.3* 1.2*  CALCIUM 9.2 8.8   Recent Labs    04/25/22 0000  AST 24  ALT 12  ALKPHOS 41   ALBUMIN 4.0   Recent Labs    04/25/22 0000  WBC 7.5  NEUTROABS 4,320.00  HGB 12.9  HCT 40  PLT 238   Lab Results  Component Value Date   TSH 2.25 05/03/2021   Lab Results  Component  Value Date   HGBA1C 6.0 10/31/2022   Lab Results  Component Value Date   CHOL 157 12/10/2021   HDL 46 12/10/2021   LDLCALC 90 12/10/2021   TRIG 109 12/10/2021    Significant Diagnostic Results in last 30 days:  No results found.  Assessment/Plan 1. Mild cognitive impairment - reports being more forgetful - writing more reminders, slower recall - MMSE 24/30 04/2022 - BIMS 15/15 11/2022 - no behaviors - not on medications - able to perform some ADLs  2. Type 2 diabetes mellitus with stage 3a chronic kidney disease, without long-term current use of insulin (HCC) - A1c stable - diet controlled - Eye exam 02/27/2023> normal - repeat A1c   3. Chronic diastolic congestive heart failure (HCC) - compensated - LVEF 55-60% 11/05/2015 - cont furosemide  4. Stage 3b chronic kidney disease (HCC) - GFR 43 - encourage hydration with water - avoid NSAIDS  5. Osteoarthritis involving multiple joints on both sides of body - ongoing - cont norco and tylenol  6. Unstable gait - no recent falls - ambulates with walker  7. Osteopenia of neck of left femur - cont calcium and vitamin D  8. Idiopathic peripheral neuropathy - stable with gabapentin   9. Recurrent depression (HCC) - Na+ stable - no mood changes - cont Lexapro     Family/ staff Communication: plan discussed with patient and nurse  Labs/tests ordered:  cbc/diff, cmp, TSH, A1c 04/17/2023

## 2023-04-17 DIAGNOSIS — E079 Disorder of thyroid, unspecified: Secondary | ICD-10-CM | POA: Diagnosis not present

## 2023-04-17 DIAGNOSIS — E1165 Type 2 diabetes mellitus with hyperglycemia: Secondary | ICD-10-CM | POA: Diagnosis not present

## 2023-04-29 ENCOUNTER — Emergency Department (HOSPITAL_COMMUNITY): Payer: Medicare Other

## 2023-04-29 ENCOUNTER — Telehealth: Payer: Self-pay | Admitting: Family

## 2023-04-29 ENCOUNTER — Other Ambulatory Visit: Payer: Self-pay

## 2023-04-29 ENCOUNTER — Inpatient Hospital Stay (HOSPITAL_COMMUNITY)
Admission: EM | Admit: 2023-04-29 | Discharge: 2023-05-02 | DRG: 378 | Disposition: A | Discharge status: Skilled Nursing Facility | Payer: Medicare Other | Source: Skilled Nursing Facility | Attending: Family Medicine | Admitting: Family Medicine

## 2023-04-29 ENCOUNTER — Encounter (HOSPITAL_COMMUNITY): Payer: Self-pay | Admitting: Emergency Medicine

## 2023-04-29 DIAGNOSIS — L89321 Pressure ulcer of left buttock, stage 1: Secondary | ICD-10-CM | POA: Diagnosis present

## 2023-04-29 DIAGNOSIS — N183 Chronic kidney disease, stage 3 unspecified: Secondary | ICD-10-CM | POA: Diagnosis present

## 2023-04-29 DIAGNOSIS — K921 Melena: Principal | ICD-10-CM | POA: Diagnosis present

## 2023-04-29 DIAGNOSIS — E785 Hyperlipidemia, unspecified: Secondary | ICD-10-CM | POA: Diagnosis not present

## 2023-04-29 DIAGNOSIS — Z66 Do not resuscitate: Secondary | ICD-10-CM | POA: Diagnosis not present

## 2023-04-29 DIAGNOSIS — I5032 Chronic diastolic (congestive) heart failure: Secondary | ICD-10-CM | POA: Diagnosis not present

## 2023-04-29 DIAGNOSIS — K5730 Diverticulosis of large intestine without perforation or abscess without bleeding: Secondary | ICD-10-CM | POA: Diagnosis not present

## 2023-04-29 DIAGNOSIS — K625 Hemorrhage of anus and rectum: Principal | ICD-10-CM

## 2023-04-29 DIAGNOSIS — Z79899 Other long term (current) drug therapy: Secondary | ICD-10-CM | POA: Diagnosis not present

## 2023-04-29 DIAGNOSIS — Z8774 Personal history of (corrected) congenital malformations of heart and circulatory system: Secondary | ICD-10-CM

## 2023-04-29 DIAGNOSIS — L89311 Pressure ulcer of right buttock, stage 1: Secondary | ICD-10-CM | POA: Diagnosis not present

## 2023-04-29 DIAGNOSIS — E119 Type 2 diabetes mellitus without complications: Secondary | ICD-10-CM | POA: Diagnosis not present

## 2023-04-29 DIAGNOSIS — F0390 Unspecified dementia without behavioral disturbance: Secondary | ICD-10-CM | POA: Diagnosis present

## 2023-04-29 DIAGNOSIS — N1831 Chronic kidney disease, stage 3a: Secondary | ICD-10-CM | POA: Diagnosis present

## 2023-04-29 DIAGNOSIS — Z860100 Personal history of colon polyps, unspecified: Secondary | ICD-10-CM

## 2023-04-29 DIAGNOSIS — Z8719 Personal history of other diseases of the digestive system: Secondary | ICD-10-CM | POA: Diagnosis not present

## 2023-04-29 DIAGNOSIS — I5089 Other heart failure: Secondary | ICD-10-CM | POA: Diagnosis not present

## 2023-04-29 DIAGNOSIS — D649 Anemia, unspecified: Secondary | ICD-10-CM

## 2023-04-29 DIAGNOSIS — Z884 Allergy status to anesthetic agent status: Secondary | ICD-10-CM | POA: Diagnosis not present

## 2023-04-29 DIAGNOSIS — I130 Hypertensive heart and chronic kidney disease with heart failure and stage 1 through stage 4 chronic kidney disease, or unspecified chronic kidney disease: Secondary | ICD-10-CM | POA: Diagnosis present

## 2023-04-29 DIAGNOSIS — E1122 Type 2 diabetes mellitus with diabetic chronic kidney disease: Secondary | ICD-10-CM | POA: Diagnosis present

## 2023-04-29 DIAGNOSIS — M159 Polyosteoarthritis, unspecified: Secondary | ICD-10-CM

## 2023-04-29 DIAGNOSIS — I10 Essential (primary) hypertension: Secondary | ICD-10-CM | POA: Diagnosis not present

## 2023-04-29 DIAGNOSIS — Z86718 Personal history of other venous thrombosis and embolism: Secondary | ICD-10-CM | POA: Diagnosis not present

## 2023-04-29 DIAGNOSIS — Z8249 Family history of ischemic heart disease and other diseases of the circulatory system: Secondary | ICD-10-CM | POA: Diagnosis not present

## 2023-04-29 DIAGNOSIS — Z888 Allergy status to other drugs, medicaments and biological substances status: Secondary | ICD-10-CM

## 2023-04-29 DIAGNOSIS — R58 Hemorrhage, not elsewhere classified: Secondary | ICD-10-CM | POA: Diagnosis not present

## 2023-04-29 DIAGNOSIS — D631 Anemia in chronic kidney disease: Secondary | ICD-10-CM | POA: Diagnosis not present

## 2023-04-29 DIAGNOSIS — H9190 Unspecified hearing loss, unspecified ear: Secondary | ICD-10-CM | POA: Diagnosis not present

## 2023-04-29 DIAGNOSIS — R1032 Left lower quadrant pain: Secondary | ICD-10-CM | POA: Diagnosis not present

## 2023-04-29 DIAGNOSIS — K922 Gastrointestinal hemorrhage, unspecified: Secondary | ICD-10-CM | POA: Diagnosis not present

## 2023-04-29 LAB — COMPREHENSIVE METABOLIC PANEL
ALT: 14 U/L (ref 0–44)
AST: 24 U/L (ref 15–41)
Albumin: 3.7 g/dL (ref 3.5–5.0)
Alkaline Phosphatase: 34 U/L — ABNORMAL LOW (ref 38–126)
Anion gap: 10 (ref 5–15)
BUN: 35 mg/dL — ABNORMAL HIGH (ref 8–23)
CO2: 25 mmol/L (ref 22–32)
Calcium: 8.9 mg/dL (ref 8.9–10.3)
Chloride: 101 mmol/L (ref 98–111)
Creatinine, Ser: 1.23 mg/dL — ABNORMAL HIGH (ref 0.44–1.00)
GFR, Estimated: 40 mL/min — ABNORMAL LOW (ref >60)
Glucose, Bld: 124 mg/dL — ABNORMAL HIGH (ref 70–99)
Potassium: 3.9 mmol/L (ref 3.5–5.1)
Sodium: 136 mmol/L (ref 135–145)
Total Bilirubin: 0.7 mg/dL (ref <1.2)
Total Protein: 6.4 g/dL — ABNORMAL LOW (ref 6.5–8.1)

## 2023-04-29 LAB — CBC
HCT: 37.4 % (ref 36.0–46.0)
Hemoglobin: 11.8 g/dL — ABNORMAL LOW (ref 12.0–15.0)
MCH: 28.8 pg (ref 26.0–34.0)
MCHC: 31.6 g/dL (ref 30.0–36.0)
MCV: 91.2 fL (ref 80.0–100.0)
Platelets: 185 10*3/uL (ref 150–400)
RBC: 4.1 MIL/uL (ref 3.87–5.11)
RDW: 14.7 % (ref 11.5–15.5)
WBC: 8.9 10*3/uL (ref 4.0–10.5)
nRBC: 0 % (ref 0.0–0.2)

## 2023-04-29 LAB — TYPE AND SCREEN
ABO/RH(D): A POS
Antibody Screen: NEGATIVE

## 2023-04-29 LAB — LIPASE, BLOOD: Lipase: 33 U/L (ref 11–51)

## 2023-04-29 MED ORDER — IOHEXOL 300 MG/ML  SOLN
80.0000 mL | Freq: Once | INTRAMUSCULAR | Status: AC | PRN
  Administered 2023-04-29: 80 mL via INTRAVENOUS

## 2023-04-29 MED ORDER — PANTOPRAZOLE SODIUM 40 MG IV SOLR
40.0000 mg | Freq: Once | INTRAVENOUS | Status: AC
  Administered 2023-04-29: 40 mg via INTRAVENOUS
  Filled 2023-04-29: qty 10

## 2023-04-29 NOTE — ED Triage Notes (Signed)
[facility], sent by staff for large amount of BRB per rectum after BM. A&Ox4, VS stable. Denies pain or any hx of rectal bleeding.

## 2023-04-29 NOTE — ED Provider Notes (Incomplete)
[place] [facility]ERGENCY DEPARTMENT AT [facility] Provider Note   CSN: [id number] Arrival date & time: 04/29/23  2144     History  Chief Complaint  Patient presents with  . Rectal Bleeding    Yolanda [name] is a 89 y.o. female with medical history of AVM of colon with hemorrhage, type 2 diabetes, diverticulosis of colon, chronic diastolic CHF, edema, CKD stage III, history of DVT, PVD.  Patient presents to ED for evaluation of rectal bleeding.  Patient reports that tonight she had a large bowel movement and then began to ooze blood from her rectum.  Denies a history of the same however patient does have history of AVM of colon with hemorrhage in her chart.  Denies blood thinning medication.  Denies ibuprofen, alcohol.  Denies lightheadedness, dizziness, weakness or shortness of breath.  Denies rectal pain, pressure.  Patient was convinced that bleeding was coming from her vagina however I reached out to the facility and they confirmed that she was having rectal bleeding that they report was very dark red.   Rectal Bleeding      Home Medications Prior to Admission medications   Medication Sig Start Date End Date Taking? Authorizing Provider  acetaminophen (TYLENOL) 500 MG tablet Take 500 mg by mouth 2 (two) times daily.     [provider]  calcium carbonate (TUMS - DOSED IN MG ELEMENTAL CALCIUM) 500 MG chewable tablet Chew 2 tablets by mouth every morning.     [provider]  cholecalciferol (VITAMIN D3) 25 MCG (1000 UNIT) tablet Take 1,000 Units by mouth daily.    [provider]  docusate sodium (COLACE) 100 MG capsule Take 100 mg by mouth every morning.    [provider]  escitalopram (LEXAPRO) 5 MG tablet Take 5 mg by mouth daily.    [provider]  furosemide (LASIX) 20 MG tablet Take 1 tablet (20 mg total) by mouth daily. 11/03/15   Benjiman Core D, PA-C  gabapentin (NEURONTIN) 300 MG capsule Take 300 mg by mouth 2  (two) times daily.    [provider]  HYDROcodone-acetaminophen (NORCO/VICODIN) 5-325 MG tablet Take 1 tablet by mouth 2 (two) times daily. Twice a day 8:00 am,8:00 pm 04/08/23   Medina-Vargas, Monina C, NP  hydrocortisone (PROCTO-MED HC) 2.5 % rectal cream Place 1 Application rectally as needed for hemorrhoids or anal itching. for external rectal hemorrhoids. unsupervised self-administration May self administer    [provider]  Multiple Vitamins-Minerals (PRESERVISION AREDS) CAPS Take 1 capsule by mouth in the morning.    [provider]  Multiple Vitamins-Minerals (THEREMS-M PO) Take 1 tablet by mouth daily.    [provider]  nystatin (MYCOSTATIN/NYSTOP) powder Apply to groin 2 times daily as needed    [provider]  Sodium Fluoride 1.1 % PSTE Place onto teeth daily.    [provider]  zinc oxide 20 % ointment Apply 1 Application topically as needed for irritation (INCONTINENCE AND REDNESS.).    [provider]      Allergies    Epinephrine, Hydrocodone-acetaminophen, Labetalol, Procaine, and Vitamin d3 [cholecalciferol]    Review of Systems   Review of Systems  Gastrointestinal:  Positive for hematochezia.    Physical Exam Updated Vital Signs BP (!) 139/56   Pulse 76   Temp 97.8 F (36.6 C) (Oral)   Resp (!) 21   SpO2 98%  Physical Exam  ED Results / Procedures / Treatments   Labs (all labs ordered are  listed, but only abnormal results are displayed) Labs Reviewed  CBC  COMPREHENSIVE METABOLIC PANEL  URINALYSIS, ROUTINE W REFLEX MICROSCOPIC  LIPASE, BLOOD  POC OCCULT BLOOD, ED  TYPE AND SCREEN    EKG None  Radiology No results found.  Procedures Procedures  {Document cardiac monitor, telemetry assessment procedure when appropriate:1}  Medications Ordered in ED Medications  pantoprazole (PROTONIX) injection 40 mg (has no administration in time range)    ED Course/ Medical Decision Making/  A&P   {   Click here for ABCD2, HEART and other calculatorsREFRESH Note before signing :1}                              Medical Decision Making Amount and/or Complexity of Data Reviewed Labs: ordered.  Risk Prescription drug management.   ***  {Document critical care time when appropriate:1} {Document review of labs and clinical decision tools ie heart score, Chads2Vasc2 etc:1}  {Document your independent review of radiology images, and any outside records:1} {Document your discussion with family members, caretakers, and with consultants:1} {Document social determinants of health affecting pt's care:1} {Document your decision making why or why not admission, treatments were needed:1} Final Clinical Impression(s) / ED Diagnoses Final diagnoses:  None    Rx / DC Orders ED Discharge Orders     None

## 2023-04-29 NOTE — Telephone Encounter (Signed)
Facility Nurse called reports patient had large amounts of bright red blood on the commode while trying to move her bowels.SBP has drop to 80's.verbal orders given to send patient to ED via EMS for further evaluation of rectal bleeding.

## 2023-04-29 NOTE — ED Provider Notes (Signed)
EMERGENCY DEPARTMENT AT [facility] Provider Note   CSN: [id number] Arrival date & time: 04/29/23  2144     History  Chief Complaint  Patient presents with   Rectal Bleeding    Yolanda [name] is a 89 y.o. female with medical history of AVM of colon with hemorrhage, type 2 diabetes, diverticulosis of colon, chronic diastolic CHF, edema, CKD stage III, history of DVT, PVD.  Patient presents to ED for evaluation of rectal bleeding.  Patient reports that tonight she had a large bowel movement and then began to ooze blood from her rectum.  Denies a history of the same however patient does have history of AVM of colon with hemorrhage in her chart.  Denies blood thinning medication.  Denies ibuprofen, alcohol.  Denies lightheadedness, dizziness, weakness or shortness of breath.  Denies rectal pain, pressure.  Patient was convinced that bleeding was coming from her vagina however I reached out to the facility and they confirmed that she was having rectal bleeding that they report was very dark red.   Rectal Bleeding Associated symptoms: no abdominal pain, no dizziness, no fever, no light-headedness and no vomiting        Home Medications Prior to Admission medications   Medication Sig Start Date End Date Taking? Authorizing Provider  acetaminophen (TYLENOL) 500 MG tablet Take 500 mg by mouth 2 (two) times daily.     [provider]  calcium carbonate (TUMS - DOSED IN MG ELEMENTAL CALCIUM) 500 MG chewable tablet Chew 2 tablets by mouth every morning.     [provider]  cholecalciferol (VITAMIN D3) 25 MCG (1000 UNIT) tablet Take 1,000 Units by mouth daily.    [provider]  docusate sodium (COLACE) 100 MG capsule Take 100 mg by mouth every morning.    [provider]  escitalopram (LEXAPRO) 5 MG tablet Take 5 mg by mouth daily.    [provider]  furosemide (LASIX) 20 MG tablet Take 1 tablet (20 mg total) by mouth  daily. 11/03/15   Benjiman Core D, PA-C  gabapentin (NEURONTIN) 300 MG capsule Take 300 mg by mouth 2 (two) times daily.    [provider]  HYDROcodone-acetaminophen (NORCO/VICODIN) 5-325 MG tablet Take 1 tablet by mouth 2 (two) times daily. Twice a day 8:00 am,8:00 pm 04/08/23   Medina-Vargas, Monina C, NP  hydrocortisone (PROCTO-MED HC) 2.5 % rectal cream Place 1 Application rectally as needed for hemorrhoids or anal itching. for external rectal hemorrhoids. unsupervised self-administration May self administer    [provider]  Multiple Vitamins-Minerals (PRESERVISION AREDS) CAPS Take 1 capsule by mouth in the morning.    [provider]  Multiple Vitamins-Minerals (THEREMS-M PO) Take 1 tablet by mouth daily.    [provider]  nystatin (MYCOSTATIN/NYSTOP) powder Apply to groin 2 times daily as needed    [provider]  Sodium Fluoride 1.1 % PSTE Place onto teeth daily.    [provider]  zinc oxide 20 % ointment Apply 1 Application topically as needed for irritation (INCONTINENCE AND REDNESS.).    [provider]      Allergies    Epinephrine, Hydrocodone-acetaminophen, Labetalol, Procaine, and Vitamin d3 [cholecalciferol]    Review of Systems   Review of Systems  Constitutional:  Negative for fever.  Respiratory:  Negative for shortness of breath.   Gastrointestinal:  Positive for blood in stool and hematochezia. Negative for abdominal pain, nausea and vomiting.  Genitourinary:  Negative for dysuria.  Neurological:  Negative for dizziness, weakness and light-headedness.  All other systems reviewed and are negative.   Physical Exam Updated Vital Signs BP (!) 139/59   Pulse 62   Temp 97.8 F (36.6 C) (Oral)   Resp 16   SpO2 93%  Physical Exam Vitals and nursing note reviewed.  Constitutional:      General: She is not in acute distress.    Appearance: Normal appearance. She is not ill-appearing,  toxic-appearing or diaphoretic.  HENT:     Head: Normocephalic and atraumatic.     Nose: Nose normal.     Mouth/Throat:     Mouth: Mucous membranes are moist.     Pharynx: Oropharynx is clear.  Eyes:     Extraocular Movements: Extraocular movements intact.     Conjunctiva/sclera: Conjunctivae normal.     Pupils: Pupils are equal, round, and reactive to light.  Cardiovascular:     Rate and Rhythm: Normal rate and regular rhythm.  Pulmonary:     Effort: Pulmonary effort is normal.     Breath sounds: Normal breath sounds. No wheezing.  Abdominal:     General: Abdomen is flat. Bowel sounds are normal.     Palpations: Abdomen is soft.     Tenderness: There is no abdominal tenderness.  Genitourinary:    Rectum: Guaiac result positive.  Musculoskeletal:     Cervical back: Normal range of motion and neck supple.  Skin:    General: Skin is warm and dry.     Capillary Refill: Capillary refill takes less than 2 seconds.  Neurological:     Mental Status: She is alert and oriented to person, place, and time.     ED Results / Procedures / Treatments   Labs (all labs ordered are listed, but only abnormal results are displayed) Labs Reviewed  CBC - Abnormal; Notable for the following components:      Result Value   Hemoglobin 11.8 (*)    All other components within normal limits  COMPREHENSIVE METABOLIC PANEL - Abnormal; Notable for the following components:   Glucose, Bld 124 (*)    BUN 35 (*)    Creatinine, Ser 1.23 (*)    Total Protein 6.4 (*)    Alkaline Phosphatase 34 (*)    GFR, Estimated 40 (*)    All other components within normal limits  POC OCCULT BLOOD, ED - Abnormal; Notable for the following components:   Fecal Occult Bld POSITIVE (*)    All other components within normal limits  LIPASE, BLOOD  URINALYSIS, ROUTINE W REFLEX MICROSCOPIC  TYPE AND SCREEN    EKG None  Radiology CT ABDOMEN PELVIS W CONTRAST Result Date: 04/29/2023 CLINICAL DATA:  Left lower  quadrant pain EXAM: CT ABDOMEN AND PELVIS WITH CONTRAST TECHNIQUE: Multidetector CT imaging of the abdomen and pelvis was performed using the standard protocol following bolus administration of intravenous contrast. RADIATION DOSE REDUCTION: This exam was performed according to the departmental dose-optimization program which includes automated exposure control, adjustment of the mA and/or kV according to patient size and/or use of iterative reconstruction technique. CONTRAST:  80mL OMNIPAQUE IOHEXOL 300 MG/ML  SOLN COMPARISON:  None Available. FINDINGS: Lower chest: No acute abnormality. Hepatobiliary: No focal liver abnormality is seen. No gallstones, gallbladder wall thickening, or biliary dilatation. Pancreas: Unremarkable. No pancreatic ductal dilatation or surrounding inflammatory changes. Spleen: Normal in size without focal abnormality. Adrenals/Urinary Tract: There is a rounded left adrenal nodule measuring 3.8 by 3.5 cm and 22 Hounsfield units. The right adrenal  gland, bladder, and kidneys are within normal limits. Stomach/Bowel: Stomach is within normal limits. Appendix appears normal. No evidence of bowel wall thickening, distention, or inflammatory changes. There are scattered colonic diverticula. Vascular/Lymphatic: Aortic atherosclerosis. No enlarged abdominal or pelvic lymph nodes. Reproductive: Status post hysterectomy. No adnexal masses. Other: No abdominal wall hernia or abnormality. No abdominopelvic ascites. Musculoskeletal: Multilevel degenerative changes affect the spine. There is a healed distal sacral fracture. IMPRESSION: 1. No acute localizing process in the abdomen or pelvis. 2. 3.8 cm left adrenal nodule. Recommend further evaluation with adrenal protocol CT or MRI. 3. Colonic diverticulosis. Aortic Atherosclerosis (ICD10-I70.0). Electronically Signed   By: Darliss Cheney M.D.   On: 04/29/2023 23:50    Procedures Procedures   Medications Ordered in ED Medications  pantoprazole  (PROTONIX) injection 40 mg (40 mg Intravenous Given 04/29/23 2247)  iohexol (OMNIPAQUE) 300 MG/ML solution 80 mL (80 mLs Intravenous Contrast Given 04/29/23 2314)    ED Course/ Medical Decision Making/ A&P Clinical Course as of 04/30/23 0116  Wed Apr 30, 2023  0028 Nand[place]am  [CG]    Clinical Course User Index [CG] Al Decant, PA-C   Medical Decision Making Amount and/or Complexity of Data Reviewed Labs: ordered.  Risk Prescription drug management.   89 year old female presents to the ED for evaluation.  Please see HPI for further details.  On my examination the patient is afebrile, nontachycardic.  Her lung sounds are clear bilaterally, she is not hypoxic.  Her abdomen is soft and compressible with no tenderness noted.  Neurological examination is at baseline.  Overall nontoxic in appearance.  Patient CBC without leukocytosis, hemoglobin 11.8 which is slightly reduced from her baseline.  Her metabolic panel shows a creatinine 1.23 which is around patient baseline, no other electrolyte derangement, BUN 35.  Patient lipase WNL.  Patient occult blood is positive.  Patient provided with 40 mg Protonix.  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  No abnormality noted.  Spoke with Dr. Meridee Score of [facility] he reports that they will consult on the patient in the morning, he has requested clear liquid diet.  This has been completed.  Admitted the patient to Dr. Para March of the [facility] hospitalist service.  Patient stable at time of admission.   Final Clinical Impression(s) / ED Diagnoses Final diagnoses:  Rectal bleeding    Rx / DC Orders ED Discharge Orders     None         Al Decant, PA-C 04/30/23 1610    Glyn Ade, MD 05/09/23 0830

## 2023-04-30 DIAGNOSIS — Z86718 Personal history of other venous thrombosis and embolism: Secondary | ICD-10-CM | POA: Diagnosis not present

## 2023-04-30 DIAGNOSIS — I5089 Other heart failure: Secondary | ICD-10-CM | POA: Diagnosis not present

## 2023-04-30 DIAGNOSIS — K921 Melena: Secondary | ICD-10-CM | POA: Diagnosis present

## 2023-04-30 DIAGNOSIS — Z66 Do not resuscitate: Secondary | ICD-10-CM | POA: Diagnosis present

## 2023-04-30 DIAGNOSIS — I130 Hypertensive heart and chronic kidney disease with heart failure and stage 1 through stage 4 chronic kidney disease, or unspecified chronic kidney disease: Secondary | ICD-10-CM | POA: Diagnosis present

## 2023-04-30 DIAGNOSIS — K922 Gastrointestinal hemorrhage, unspecified: Secondary | ICD-10-CM

## 2023-04-30 DIAGNOSIS — N183 Chronic kidney disease, stage 3 unspecified: Secondary | ICD-10-CM | POA: Diagnosis not present

## 2023-04-30 DIAGNOSIS — D649 Anemia, unspecified: Secondary | ICD-10-CM

## 2023-04-30 DIAGNOSIS — E785 Hyperlipidemia, unspecified: Secondary | ICD-10-CM | POA: Diagnosis present

## 2023-04-30 DIAGNOSIS — F0390 Unspecified dementia without behavioral disturbance: Secondary | ICD-10-CM | POA: Diagnosis present

## 2023-04-30 DIAGNOSIS — N1831 Chronic kidney disease, stage 3a: Secondary | ICD-10-CM | POA: Diagnosis present

## 2023-04-30 DIAGNOSIS — Z7401 Bed confinement status: Secondary | ICD-10-CM | POA: Diagnosis not present

## 2023-04-30 DIAGNOSIS — Z860100 Personal history of colon polyps, unspecified: Secondary | ICD-10-CM | POA: Diagnosis not present

## 2023-04-30 DIAGNOSIS — Z8719 Personal history of other diseases of the digestive system: Secondary | ICD-10-CM | POA: Diagnosis not present

## 2023-04-30 DIAGNOSIS — L89311 Pressure ulcer of right buttock, stage 1: Secondary | ICD-10-CM | POA: Diagnosis present

## 2023-04-30 DIAGNOSIS — E1122 Type 2 diabetes mellitus with diabetic chronic kidney disease: Secondary | ICD-10-CM | POA: Diagnosis present

## 2023-04-30 DIAGNOSIS — D631 Anemia in chronic kidney disease: Secondary | ICD-10-CM | POA: Diagnosis present

## 2023-04-30 DIAGNOSIS — I5032 Chronic diastolic (congestive) heart failure: Secondary | ICD-10-CM | POA: Diagnosis present

## 2023-04-30 DIAGNOSIS — Z888 Allergy status to other drugs, medicaments and biological substances status: Secondary | ICD-10-CM | POA: Diagnosis not present

## 2023-04-30 DIAGNOSIS — R58 Hemorrhage, not elsewhere classified: Secondary | ICD-10-CM | POA: Diagnosis not present

## 2023-04-30 DIAGNOSIS — Z79899 Other long term (current) drug therapy: Secondary | ICD-10-CM | POA: Diagnosis not present

## 2023-04-30 DIAGNOSIS — K625 Hemorrhage of anus and rectum: Secondary | ICD-10-CM | POA: Diagnosis present

## 2023-04-30 DIAGNOSIS — E119 Type 2 diabetes mellitus without complications: Secondary | ICD-10-CM | POA: Diagnosis not present

## 2023-04-30 DIAGNOSIS — Z8249 Family history of ischemic heart disease and other diseases of the circulatory system: Secondary | ICD-10-CM | POA: Diagnosis not present

## 2023-04-30 DIAGNOSIS — L89321 Pressure ulcer of left buttock, stage 1: Secondary | ICD-10-CM | POA: Diagnosis present

## 2023-04-30 DIAGNOSIS — Z884 Allergy status to anesthetic agent status: Secondary | ICD-10-CM | POA: Diagnosis not present

## 2023-04-30 DIAGNOSIS — H9190 Unspecified hearing loss, unspecified ear: Secondary | ICD-10-CM | POA: Diagnosis present

## 2023-04-30 DIAGNOSIS — Z8774 Personal history of (corrected) congenital malformations of heart and circulatory system: Secondary | ICD-10-CM

## 2023-04-30 LAB — URINALYSIS, ROUTINE W REFLEX MICROSCOPIC
Bilirubin Urine: NEGATIVE
Glucose, UA: NEGATIVE mg/dL
Ketones, ur: NEGATIVE mg/dL
Nitrite: NEGATIVE
Protein, ur: NEGATIVE mg/dL
Specific Gravity, Urine: 1.03 (ref 1.005–1.030)
pH: 5 (ref 5.0–8.0)

## 2023-04-30 LAB — CBC WITH DIFFERENTIAL/PLATELET
Abs Immature Granulocytes: 0.02 10*3/uL (ref 0.00–0.07)
Basophils Absolute: 0 10*3/uL (ref 0.0–0.1)
Basophils Relative: 0 %
Eosinophils Absolute: 0.2 10*3/uL (ref 0.0–0.5)
Eosinophils Relative: 3 %
HCT: 37.7 % (ref 36.0–46.0)
Hemoglobin: 12.2 g/dL (ref 12.0–15.0)
Immature Granulocytes: 0 %
Lymphocytes Relative: 26 %
Lymphs Abs: 1.7 10*3/uL (ref 0.7–4.0)
MCH: 29.2 pg (ref 26.0–34.0)
MCHC: 32.4 g/dL (ref 30.0–36.0)
MCV: 90.2 fL (ref 80.0–100.0)
Monocytes Absolute: 0.5 10*3/uL (ref 0.1–1.0)
Monocytes Relative: 8 %
Neutro Abs: 4 10*3/uL (ref 1.7–7.7)
Neutrophils Relative %: 63 %
Platelets: 173 10*3/uL (ref 150–400)
RBC: 4.18 MIL/uL (ref 3.87–5.11)
RDW: 14.6 % (ref 11.5–15.5)
WBC: 6.5 10*3/uL (ref 4.0–10.5)
nRBC: 0 % (ref 0.0–0.2)

## 2023-04-30 LAB — POC OCCULT BLOOD, ED: Fecal Occult Bld: POSITIVE — AB

## 2023-04-30 LAB — HEMOGLOBIN: Hemoglobin: 11.8 g/dL — ABNORMAL LOW (ref 12.0–15.0)

## 2023-04-30 LAB — HEMOGLOBIN A1C
Hgb A1c MFr Bld: 5.7 % — ABNORMAL HIGH (ref 4.8–5.6)
Mean Plasma Glucose: 116.89 mg/dL

## 2023-04-30 LAB — GLUCOSE, CAPILLARY: Glucose-Capillary: 129 mg/dL — ABNORMAL HIGH (ref 70–99)

## 2023-04-30 LAB — CBG MONITORING, ED
Glucose-Capillary: 109 mg/dL — ABNORMAL HIGH (ref 70–99)
Glucose-Capillary: 95 mg/dL (ref 70–99)
Glucose-Capillary: 95 mg/dL (ref 70–99)

## 2023-04-30 MED ORDER — VITAMIN D 25 MCG (1000 UNIT) PO TABS
1000.0000 [IU] | ORAL_TABLET | Freq: Every day | ORAL | Status: DC
  Administered 2023-04-30 – 2023-05-02 (×3): 1000 [IU] via ORAL
  Filled 2023-04-30 (×3): qty 1

## 2023-04-30 MED ORDER — ONDANSETRON HCL 4 MG PO TABS: 4.0000 mg | ORAL_TABLET | Freq: Four times a day (QID) | ORAL | Status: DC | PRN

## 2023-04-30 MED ORDER — INSULIN ASPART 100 UNIT/ML IJ SOLN
0.0000 [IU] | Freq: Three times a day (TID) | INTRAMUSCULAR | Status: DC
Start: 2023-04-30 — End: 2023-05-02
  Filled 2023-04-30: qty 0.09

## 2023-04-30 MED ORDER — GABAPENTIN 300 MG PO CAPS
300.0000 mg | ORAL_CAPSULE | Freq: Two times a day (BID) | ORAL | Status: DC
  Administered 2023-04-30 – 2023-05-02 (×5): 300 mg via ORAL
  Filled 2023-04-30 (×5): qty 1

## 2023-04-30 MED ORDER — ACETAMINOPHEN 325 MG PO TABS: 650.0000 mg | ORAL_TABLET | Freq: Four times a day (QID) | ORAL | Status: DC | PRN

## 2023-04-30 MED ORDER — ONDANSETRON HCL 4 MG/2ML IJ SOLN: 4.0000 mg | Freq: Four times a day (QID) | INTRAMUSCULAR | Status: DC | PRN

## 2023-04-30 MED ORDER — HYDROCODONE-ACETAMINOPHEN 5-325 MG PO TABS
1.0000 | ORAL_TABLET | Freq: Two times a day (BID) | ORAL | Status: DC
  Administered 2023-04-30 – 2023-05-02 (×4): 1 via ORAL
  Filled 2023-04-30 (×4): qty 1

## 2023-04-30 MED ORDER — CALCIUM CARBONATE ANTACID 500 MG PO CHEW
2.0000 | CHEWABLE_TABLET | ORAL | Status: DC
  Administered 2023-04-30 – 2023-05-02 (×3): 400 mg via ORAL
  Filled 2023-04-30 (×3): qty 2

## 2023-04-30 MED ORDER — ACETAMINOPHEN 650 MG RE SUPP: 650.0000 mg | Freq: Four times a day (QID) | RECTAL | Status: DC | PRN

## 2023-04-30 MED ORDER — ESCITALOPRAM OXALATE 10 MG PO TABS
5.0000 mg | ORAL_TABLET | Freq: Every day | ORAL | Status: DC
Start: 2023-04-30 — End: 2023-05-02
  Administered 2023-04-30 – 2023-05-02 (×3): 5 mg via ORAL
  Filled 2023-04-30 (×3): qty 1

## 2023-04-30 MED ORDER — INSULIN ASPART 100 UNIT/ML IJ SOLN
0.0000 [IU] | Freq: Every day | INTRAMUSCULAR | Status: DC
  Filled 2023-04-30: qty 0.05

## 2023-04-30 MED ORDER — HYDROCORTISONE (PERIANAL) 2.5 % EX CREA: 1.0000 | TOPICAL_CREAM | CUTANEOUS | Status: DC | PRN

## 2023-04-30 NOTE — ED Notes (Signed)
Pt ambulated to BR with need of assistance, pt used walker as assistive device. Pt had steady gait with walker now laying in bed. Call light within reach. Bed locked and in lowest position.

## 2023-04-30 NOTE — Plan of Care (Signed)
  Problem: Education: Goal: Knowledge of General Education information will improve Description: Including pain rating scale, medication(s)/side effects and non-pharmacologic comfort measures Outcome: Progressing   Problem: Clinical Measurements: Goal: Diagnostic test results will improve Outcome: Progressing   Problem: Activity: Goal: Risk for activity intolerance will decrease Outcome: Progressing   Problem: Nutrition: Goal: Adequate nutrition will be maintained Outcome: Progressing   Problem: Safety: Goal: Ability to remain free from injury will improve Outcome: Progressing

## 2023-04-30 NOTE — Consult Note (Addendum)
Consultation  Referring Provider: [facility]/ Pahwani Primary Care Physician:  Mahlon Gammon, MD Primary Gastroenterologist:  Dr.Nandigam  Reason for Consultation:  acute lower GI bleed  HPI: Yolanda [name] is a 89 y.o. female resident of [facility] with history of chronic kidney disease stage III, prior DVT, congestive heart failure, diabetes mellitus, and previous history of GI bleeding who was brought to the emergency room last evening after she had a grossly bloody bowel movement.  Patient is hard of hearing but says that she was not aware of any bleeding until the 1 bowel movement yesterday afternoon which she saw in the commode and says was maroonish appearing. Fortunately she has not had any further stools overnight, she has not had any associated abdominal pain or cramping no diaphoresis no nausea or vomiting.  She denies any anal rectal discomfort, no recent changes in bowel habits She did undergo CT of the abdomen and pelvis with contrast which showed a left adrenal nodule, there is no evidence of bowel wall thickening, she does have scattered diverticulosis.  Labs on arrival last evening hemoglobin 11.8/hematocrit 37/MCV 91.2/platelets 185 Na136/potassium 3.9/BUN 35/creat 1.23 LFTs normal Stool documented heme positive  She last had colonoscopy in 2017 per Dr. Lavon Paganini done for bleeding with finding of 1 medium sized AVM with stigmata of bleeding at the cecum this was treated with APC and was also noted to have few scattered sigmoid diverticuli and internal hemorrhoids-medium sized.  She also had colonoscopy in 2012 with finding of AVMs in the cecum, largest 4 mm and 2 smaller the largest was treated with APC also noted to have scattered diverticulosis.  She has not been on any aspirin or blood thinners.   Past Medical History:  Diagnosis Date   Acute blood loss anemia    Allergy    Asthma    AVM (arteriovenous malformation) of colon with hemorrhage    Bursitis  of right hip    Cataract    CKD (chronic kidney disease), stage III (HCC) 11/04/2015   COLONIC POLYPS 03/26/2005   Qualifier: Diagnosis of  By: Glyn Ade CMA (AAMA), June     DIVERTICULOSIS, COLON 03/26/2005   Qualifier: Diagnosis of  By: Glyn Ade CMA Duncan Dull), June     DVT (deep venous thrombosis), left 11/03/2015   Ecchymosis 12/16/2015   Hyperglycemia 12/16/2015   Hyperlipidemia    Hypertension    Multiple skin tears 12/16/2015   Osteopenia    Stasis edema of both lower extremities 11/04/2015   Unstable gait 12/16/2015   Urine incontinence 12/16/2015    Past Surgical History:  Procedure Laterality Date   ABDOMINAL HYSTERECTOMY  2006   Dr. Delia Heady   CATARACT EXTRACTION W/ INTRAOCULAR LENS IMPLANT Bilateral 1999/2003   Dr. Jethro Bolus   COLONOSCOPY N/A 11/06/2015   Procedure: COLONOSCOPY;  Surgeon: Napoleon Form, MD;  Location: [facility];  Service: Endoscopy;  Laterality: N/A;  MAC if available, otherwise moderate sedation    Prior to Admission medications   Medication Sig Start Date End Date Taking? Authorizing Provider  acetaminophen (TYLENOL) 500 MG tablet Take 500 mg by mouth 2 (two) times daily.     [provider]  calcium carbonate (TUMS - DOSED IN MG ELEMENTAL CALCIUM) 500 MG chewable tablet Chew 2 tablets by mouth every morning.     [provider]  cholecalciferol (VITAMIN D3) 25 MCG (1000 UNIT) tablet Take 1,000 Units by mouth daily.    [provider]  docusate sodium (COLACE) 100  MG capsule Take 100 mg by mouth every morning.    [provider]  escitalopram (LEXAPRO) 5 MG tablet Take 5 mg by mouth daily.    [provider]  furosemide (LASIX) 20 MG tablet Take 1 tablet (20 mg total) by mouth daily. 11/03/15   Benjiman Core D, PA-C  gabapentin (NEURONTIN) 300 MG capsule Take 300 mg by mouth 2 (two) times daily.    [provider]  HYDROcodone-acetaminophen (NORCO/VICODIN) 5-325 MG tablet Take 1 tablet by mouth 2  (two) times daily. Twice a day 8:00 am,8:00 pm 04/08/23   Medina-Vargas, Monina C, NP  hydrocortisone (PROCTO-MED HC) 2.5 % rectal cream Place 1 Application rectally as needed for hemorrhoids or anal itching. for external rectal hemorrhoids. unsupervised self-administration May self administer    [provider]  Multiple Vitamins-Minerals (PRESERVISION AREDS) CAPS Take 1 capsule by mouth in the morning.    [provider]  Multiple Vitamins-Minerals (THEREMS-M PO) Take 1 tablet by mouth daily.    [provider]  nystatin (MYCOSTATIN/NYSTOP) powder Apply to groin 2 times daily as needed    [provider]  Sodium Fluoride 1.1 % PSTE Place onto teeth daily.    [provider]  zinc oxide 20 % ointment Apply 1 Application topically as needed for irritation (INCONTINENCE AND REDNESS.).    [provider]    Current Facility-Administered Medications  Medication Dose Route Frequency Provider Last Rate Last Admin   acetaminophen (TYLENOL) tablet 650 mg  650 mg Oral Q6H PRN Andris Baumann, MD       Or   acetaminophen (TYLENOL) suppository 650 mg  650 mg Rectal Q6H PRN Andris Baumann, MD       HYDROcodone-acetaminophen (NORCO/VICODIN) 5-325 MG per tablet 1 tablet  1 tablet Oral BID Andris Baumann, MD       insulin aspart (novoLOG) injection 0-5 Units  0-5 Units Subcutaneous QHS Lindajo Royal V, MD       insulin aspart (novoLOG) injection 0-9 Units  0-9 Units Subcutaneous TID WC Andris Baumann, MD       ondansetron [facility]) tablet 4 mg  4 mg Oral Q6H PRN Andris Baumann, MD       Or   ondansetron [facility]) injection 4 mg  4 mg Intravenous Q6H PRN Andris Baumann, MD       Current Outpatient Medications  Medication Sig Dispense Refill   acetaminophen (TYLENOL) 500 MG tablet Take 500 mg by mouth 2 (two) times daily.      calcium carbonate (TUMS - DOSED IN MG ELEMENTAL CALCIUM) 500 MG chewable tablet Chew 2 tablets by mouth every morning.       cholecalciferol (VITAMIN D3) 25 MCG (1000 UNIT) tablet Take 1,000 Units by mouth daily.     docusate sodium (COLACE) 100 MG capsule Take 100 mg by mouth every morning.     escitalopram (LEXAPRO) 5 MG tablet Take 5 mg by mouth daily.     furosemide (LASIX) 20 MG tablet Take 1 tablet (20 mg total) by mouth daily. 4 tablet 0   gabapentin (NEURONTIN) 300 MG capsule Take 300 mg by mouth 2 (two) times daily.     HYDROcodone-acetaminophen (NORCO/VICODIN) 5-325 MG tablet Take 1 tablet by mouth 2 (two) times daily. Twice a day 8:00 am,8:00 pm 60 tablet 0   hydrocortisone (PROCTO-MED HC) 2.5 % rectal cream Place 1 Application rectally as needed for hemorrhoids or anal itching. for external rectal hemorrhoids. unsupervised self-administration May self  administer     Multiple Vitamins-Minerals (PRESERVISION AREDS) CAPS Take 1 capsule by mouth in the morning.     Multiple Vitamins-Minerals (THEREMS-M PO) Take 1 tablet by mouth daily.     nystatin (MYCOSTATIN/NYSTOP) powder Apply to groin 2 times daily as needed     Sodium Fluoride 1.1 % PSTE Place onto teeth daily.     zinc oxide 20 % ointment Apply 1 Application topically as needed for irritation (INCONTINENCE AND REDNESS.).      Allergies as of 04/29/2023 - Review Complete 04/29/2023  Allergen Reaction Noted   Epinephrine Nausea Only    Hydrocodone-acetaminophen     Labetalol  12/14/2015   Procaine  12/14/2015   Vitamin d3 [cholecalciferol]  01/31/2022    Family History  Problem Relation Age of Onset   Heart attack Father 60    Social History   Socioeconomic History   Marital status: Widowed    Spouse name: Not on file   Number of children: 2   Years of education: Not on file   Highest education level: Not on file  Occupational History   Occupation: retired Tree surgeon  Tobacco Use   Smoking status: Never   Smokeless tobacco: Never  Vaping Use   Vaping status: Never Used  Substance and Sexual Activity   Alcohol use: No     Alcohol/week: 0.0 standard drinks of alcohol   Drug use: No   Sexual activity: Never    Comment: was until a few years ago  Other Topics Concern   Not on file  Social History Narrative   Lives alone in an retirement community. Moved to [place] 12/08/15   Walks with a walker.   NOK: 2 sons with shared POA (they live far away) - Onalee Hua Dintinfass would be first call.   Never smoked   Alcohol none   Exercise none   POA, Living Will, MOST             Social Drivers of Health   Financial Resource Strain: Low Risk  (07/15/2022)   Overall Financial Resource Strain (CARDIA)    Difficulty of Paying Living Expenses: Not hard at all  Food Insecurity: No Food Insecurity (07/15/2022)   Hunger Vital Sign    Worried About Running Out of Food in the Last Year: Never true    Ran Out of Food in the Last Year: Never true  Transportation Needs: No Transportation Needs (07/15/2022)   PRAPARE - Administrator, Civil Service (Medical): No    Lack of Transportation (Non-Medical): No  Physical Activity: Insufficiently Active (07/15/2022)   Exercise Vital Sign    Days of Exercise per Week: 5 days    Minutes of Exercise per Session: 20 min  Stress: No Stress Concern Present (07/15/2022)   [employer] of Occupational Health - Occupational Stress Questionnaire    Feeling of Stress : Not at all  Social Connections: Socially Isolated (07/15/2022)   Social Connection and Isolation Panel [NHANES]    Frequency of Communication with Friends and Family: More than three times a week    Frequency of Social Gatherings with Friends and Family: More than three times a week    Attends Religious Services: Never    Database administrator or Organizations: No    Attends Banker Meetings: Never    Marital Status: Widowed  Intimate Partner Violence: Not At Risk (07/15/2022)   Humiliation, Afraid, Rape, and Kick questionnaire    Fear of Current or Ex-Partner: No  Emotionally Abused: No    Physically  Abused: No    Sexually Abused: No    Review of Systems: Pertinent positive and negative review of systems were noted in the above HPI section.  All other review of systems was otherwise negative.   Physical Exam: Vital signs in last 24 hours: Temp:  [97.4 F (36.3 C)-97.8 F (36.6 C)] 97.7 F (36.5 C) (12/18 0500) Pulse Rate:  [59-76] 72 (12/18 0800) Resp:  [11-21] 16 (12/18 0800) BP: (126-159)/(48-61) 159/61 (12/18 0800) SpO2:  [92 %-98 %] 94 % (12/18 0800)   General:   Alert,  Well-developed, well-nourished, very elderly hard of hearing white female, pleasant and cooperative in NAD Head:  Normocephalic and atraumatic. Eyes:  Sclera clear, no icterus.   Conjunctiva pink. Ears:  Normal auditory acuity. Nose:  No deformity, discharge,  or lesions. Mouth:  No deformity or lesions.   Neck:  Supple; no masses or thyromegaly. Lungs:  Clear throughout to auscultation.   No wheezes, crackles, or rhonchi.  Heart:  Regular rate and rhythm; no murmurs, clicks, rubs,  or gallops. Abdomen:  Soft,nontender, BS active,nonpalp mass or hsm.   Rectal: Not done, documented heme positive per ER provider Msk:  Symmetrical without gross deformities. . Pulses:  Normal pulses noted. Extremities:  Without clubbing or edema. Neurologic:  Alert and  oriented x4;  grossly normal neurologically..  Hard of hearing Skin:  Intact without significant lesions or rashes.. Psych:  Alert and cooperative. Normal mood and affect.  Intake/Output from previous day: No intake/output data recorded. Intake/Output this shift: No intake/output data recorded.  Lab Results: Recent Labs    04/29/23 2230 04/30/23 0500  WBC 8.9  --   HGB 11.8* 11.8*  HCT 37.4  --   PLT 185  --    BMET Recent Labs    04/29/23 2230  NA 136  K 3.9  CL 101  CO2 25  GLUCOSE 124*  BUN 35*  CREATININE 1.23*  CALCIUM 8.9   LFT Recent Labs    04/29/23 2230  PROT 6.4*  ALBUMIN 3.7  AST 24  ALT 14  ALKPHOS 34*  BILITOT  0.7      IMPRESSION:  #15 89 year old white female with what appears to be an acute self-limited painless lower GI bleed with 1 maroonish bowel movement yesterday afternoon. 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  She had colonoscopy in 2017 for bleeding with APC of a cecal AVM and also noted scattered diverticulosis.  Also had a bleed in 2012 with 2-3 small cecal AVMs found, the largest was treated and noted scattered diverticulosis.  Unclear whether this episode represents diverticular bleeding or bleed secondary to recurrent AVMs  #2 chronic kidney disease stage III #3.  Diabetes mellitus #4.  Prior history of DVT #5.  History of congestive heart failure  Plan; clear to full liquids Serial hemoglobins and transfuse as indicated Will not plan repeat endoscopic evaluation at her very advanced age unless she manifests ongoing active bleeding.  Should she have significant bleeding or hemodynamic instability then would need to proceed to CTA and potential embolization. GI will follow with you.   Amy Esterwood PA-C 04/30/2023, 8:43 AM     [facility] Attending   I have taken an interval history, reviewed the chart and examined the patient. I agree with the Advanced Practitioner's note, impression and  recommendations with the following additions:  As above - conservative approach given age and co-morbidities. However if further bleeding will need to consider other tests as   Iva Boop, MD, [facility] See Loretha Stapler on call - gastroenterology for best contact person 04/30/2023 4:28 PM

## 2023-04-30 NOTE — H&P (Signed)
History and Physical    Patient: Yolanda [name] ZOX:[id number] DOB: 12-Apr-1926 DOA: 04/29/2023 DOS: the patient was seen and examined on 04/30/2023 PCP: Mahlon Gammon, MD  Patient coming from: ALF/ILF  Chief Complaint:  Chief Complaint  Patient presents with   Rectal Bleeding    HPI: Yolanda [name] is a 89 y.o. 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  Patient has a remote history of DVT but is no longer on anticoagulation.  She denies lightheadedness, palpitations or shortness of breath.  Denies straining to have a bowel movement, pain in the rectal area and denies a history of hemorrhoids. ED course and data review: Vitals within normal limits. Notable labs on ED workup include the following: Hemoglobin 11.8.  Most recent baseline was 12.9 a year ago Guaiac positive CMP unremarkable.  Creatinine at baseline at 1.23 Urinalysis with small leuks CT abdomen and pelvis showing diverticulosis in the left adrenal nodule as follows: IMPRESSION: 1. No acute localizing process in the abdomen or pelvis. 2. 3.8 cm left adrenal nodule. Recommend further evaluation with adrenal protocol CT or MRI. 3. Colonic diverticulosis.  Patient given a dose of Protonix IV  The ED provider spoke with Dr. Meridee Score of [facility] who will see patient in the a.m. and allowed a clear liquid diet Hospitalist consulted for admission.   Review of Systems: As mentioned in the history of present illness. All other systems reviewed and are negative.  Past Medical History:  Diagnosis Date   Acute blood loss anemia    Allergy    Asthma    AVM (arteriovenous malformation) of colon with hemorrhage    Bursitis of right hip     Cataract    CKD (chronic kidney disease), stage III (HCC) 11/04/2015   COLONIC POLYPS 03/26/2005   Qualifier: Diagnosis of  By: Glyn Ade CMA (AAMA), June     DIVERTICULOSIS, COLON 03/26/2005   Qualifier: Diagnosis of  By: Glyn Ade CMA Brynlyn Dade Dull), June     DVT (deep venous thrombosis), left 11/03/2015   Ecchymosis 12/16/2015   Hyperglycemia 12/16/2015   Hyperlipidemia    Hypertension    Multiple skin tears 12/16/2015   Osteopenia    Stasis edema of both lower extremities 11/04/2015   Unstable gait 12/16/2015   Urine incontinence 12/16/2015   Past Surgical History:  Procedure Laterality Date   ABDOMINAL HYSTERECTOMY  2006   Dr. Delia Heady   CATARACT EXTRACTION W/ INTRAOCULAR LENS IMPLANT Bilateral 1999/2003   Dr. Jethro Bolus   COLONOSCOPY N/A 11/06/2015   Procedure: COLONOSCOPY;  Surgeon: Napoleon Form, MD;  Location: [facility];  Service: Endoscopy;  Laterality: N/A;  MAC if available, otherwise moderate sedation   Social History:  reports that she has never smoked. She has never used smokeless tobacco. She reports that she does not drink alcohol and does not use drugs.  Allergies  Allergen Reactions   Epinephrine Nausea Only    Unknown.   Hydrocodone-Acetaminophen     REACTION: nausea   Labetalol    Procaine    Vitamin D3 [Cholecalciferol]     Family History  Problem Relation Age of Onset   Heart attack Father 67    Prior to Admission medications  Medication Sig Start Date End Date Taking? Authorizing Provider  acetaminophen (TYLENOL) 500 MG tablet Take 500 mg by mouth 2 (two) times daily.     [provider]  calcium carbonate (TUMS - DOSED IN MG ELEMENTAL CALCIUM) 500 MG chewable tablet Chew 2 tablets by mouth every morning.     [provider]  cholecalciferol (VITAMIN D3) 25 MCG (1000 UNIT) tablet Take 1,000 Units by mouth daily.    [provider]  docusate sodium (COLACE) 100 MG capsule Take 100 mg by mouth every morning.    [provider]  escitalopram (LEXAPRO) 5 MG tablet Take 5 mg by mouth daily.    [provider]  furosemide (LASIX) 20 MG tablet Take 1 tablet (20 mg total) by mouth daily. 11/03/15   Benjiman Core D, PA-C  gabapentin (NEURONTIN) 300 MG capsule Take 300 mg by mouth 2 (two) times daily.    [provider]  HYDROcodone-acetaminophen (NORCO/VICODIN) 5-325 MG tablet Take 1 tablet by mouth 2 (two) times daily. Twice a day 8:00 am,8:00 pm 04/08/23   Medina-Vargas, Monina C, NP  hydrocortisone (PROCTO-MED HC) 2.5 % rectal cream Place 1 Application rectally as needed for hemorrhoids or anal itching. for external rectal hemorrhoids. unsupervised self-administration May self administer    [provider]  Multiple Vitamins-Minerals (PRESERVISION AREDS) CAPS Take 1 capsule by mouth in the morning.    [provider]  Multiple Vitamins-Minerals (THEREMS-M PO) Take 1 tablet by mouth daily.    [provider]  nystatin (MYCOSTATIN/NYSTOP) powder Apply to groin 2 times daily as needed    [provider]  Sodium Fluoride 1.1 % PSTE Place onto teeth daily.    [provider]  zinc oxide 20 % ointment Apply 1 Application topically as needed for irritation (INCONTINENCE AND REDNESS.).    [provider]    Physical Exam: Vitals:   04/29/23 2155 04/29/23 2156 04/30/23 0030 04/30/23 0132  BP: (!) 139/56  (!) 139/59   Pulse: 76  62   Resp: (!) 21  16   Temp:  97.8 F (36.6 C)  (!) 97.4 F (36.3 C)  TempSrc:  Oral    SpO2: 98%  93%    Physical Exam Vitals and nursing note reviewed.  Constitutional:      General: She is not in acute distress. HENT:     Head: Normocephalic and atraumatic.  Cardiovascular:     Rate and Rhythm: Normal rate and regular rhythm.     Heart sounds: Normal heart sounds.  Pulmonary:     Effort: Pulmonary effort is normal.     Breath sounds: Normal breath sounds.  Abdominal:     Palpations: Abdomen is  soft.     Tenderness: There is no abdominal tenderness.  Neurological:     Mental Status: Mental status is at baseline.     Labs on Admission: I have personally reviewed following labs and imaging studies  CBC: Recent Labs  Lab 04/29/23 2230  WBC 8.9  HGB 11.8*  HCT 37.4  MCV 91.2  PLT 185   Basic Metabolic Panel: Recent Labs  Lab 04/29/23 2230  NA 136  K 3.9  CL 101  CO2 25  GLUCOSE 124*  BUN 35*  CREATININE 1.23*  CALCIUM 8.9   GFR: CrCl cannot be calculated (Unknown ideal weight.). Liver Function Tests: Recent Labs  Lab 04/29/23 2230  AST 24  ALT 14  ALKPHOS 34*  BILITOT 0.7  PROT 6.4*  ALBUMIN 3.7  Recent Labs  Lab 04/29/23 2231  LIPASE 33   No results for input(s): "AMMONIA" in the last 168 hours. Coagulation Profile: No results for input(s): "INR", "PROTIME" in the last 168 hours. Cardiac Enzymes: No results for input(s): "CKTOTAL", "CKMB", "CKMBINDEX", "TROPONINI" in the last 168 hours. BNP (last 3 results) No results for input(s): "PROBNP" in the last 8760 hours. HbA1C: No results for input(s): "HGBA1C" in the last 72 hours. CBG: No results for input(s): "GLUCAP" in the last 168 hours. Lipid Profile: No results for input(s): "CHOL", "HDL", "LDLCALC", "TRIG", "CHOLHDL", "LDLDIRECT" in the last 72 hours. Thyroid Function Tests: No results for input(s): "TSH", "T4TOTAL", "FREET4", "T3FREE", "THYROIDAB" in the last 72 hours. Anemia Panel: No results for input(s): "VITAMINB12", "FOLATE", "FERRITIN", "TIBC", "IRON", "RETICCTPCT" in the last 72 hours. Urine analysis:    Component Value Date/Time   COLORURINE YELLOW 04/30/2023 0142   APPEARANCEUR CLEAR 04/30/2023 0142   LABSPEC 1.030 04/30/2023 0142   PHURINE 5.0 04/30/2023 0142   GLUCOSEU NEGATIVE 04/30/2023 0142   HGBUR LARGE (A) 04/30/2023 0142   BILIRUBINUR NEGATIVE 04/30/2023 0142   KETONESUR NEGATIVE 04/30/2023 0142   PROTEINUR NEGATIVE 04/30/2023 0142   NITRITE NEGATIVE 04/30/2023  0142   LEUKOCYTESUR SMALL (A) 04/30/2023 0142    Radiological Exams on Admission: CT ABDOMEN PELVIS W CONTRAST Result Date: 04/29/2023 CLINICAL DATA:  Left lower quadrant pain EXAM: CT ABDOMEN AND PELVIS WITH CONTRAST TECHNIQUE: Multidetector CT imaging of the abdomen and pelvis was performed using the standard protocol following bolus administration of intravenous contrast. RADIATION DOSE REDUCTION: This exam was performed according to the departmental dose-optimization program which includes automated exposure control, adjustment of the mA and/or kV according to patient size and/or use of iterative reconstruction technique. CONTRAST:  80mL OMNIPAQUE IOHEXOL 300 MG/ML  SOLN COMPARISON:  None Available. FINDINGS: Lower chest: No acute abnormality. Hepatobiliary: No focal liver abnormality is seen. No gallstones, gallbladder wall thickening, or biliary dilatation. Pancreas: Unremarkable. No pancreatic ductal dilatation or surrounding inflammatory changes. Spleen: Normal in size without focal abnormality. Adrenals/Urinary Tract: There is a rounded left adrenal nodule measuring 3.8 by 3.5 cm and 22 Hounsfield units. The right adrenal gland, bladder, and kidneys are within normal limits. Stomach/Bowel: Stomach is within normal limits. Appendix appears normal. No evidence of bowel wall thickening, distention, or inflammatory changes. There are scattered colonic diverticula. Vascular/Lymphatic: Aortic atherosclerosis. No enlarged abdominal or pelvic lymph nodes. Reproductive: Status post hysterectomy. No adnexal masses. Other: No abdominal wall hernia or abnormality. No abdominopelvic ascites. Musculoskeletal: Multilevel degenerative changes affect the spine. There is a healed distal sacral fracture. IMPRESSION: 1. No acute localizing process in the abdomen or pelvis. 2. 3.8 cm left adrenal nodule. Recommend further evaluation with adrenal protocol CT or MRI. 3. Colonic diverticulosis. Aortic Atherosclerosis  (ICD10-I70.0). Electronically Signed   By: Darliss Cheney M.D.   On: 04/29/2023 23:50     Data Reviewed: Relevant notes from primary care and specialist visits, past discharge summaries as available in EHR, including Care Everywhere. Prior diagnostic testing as pertinent to current admission diagnoses Updated medications and problem lists for reconciliation ED course, including vitals, labs, imaging, treatment and response to treatment Triage notes, nursing and pharmacy notes and ED provider's notes Notable results as noted in HPI   Assessment and Plan: * Hematochezia-lower GI bleed History of bleeding colonic AVMs 2017 treated with APC Diverticulosis of colon Anemia, uncertain acuity Serial H&H and transfuse as needed Clear liquid diet-per GI Formal GI consult   Type 2 diabetes mellitus with  diabetic chronic kidney disease (HCC) Sliding scale insulin coverage  CKD (chronic kidney disease), stage III (HCC) Renal function at baseline        DVT prophylaxis: SCD  Consults: GI, Dr. Meridee Score  Advance Care Planning:   Code Status: Prior   Family Communication: none  Disposition Plan: Back to previous home environment  Severity of Illness: The appropriate patient status for this patient is INPATIENT. Inpatient status is judged to be reasonable and necessary in order to provide the required intensity of service to ensure the patient's safety. The patient's presenting symptoms, physical exam findings, and initial radiographic and laboratory data in the context of their chronic comorbidities is felt to place them at high risk for further clinical deterioration. Furthermore, it is not anticipated that the patient will be medically stable for discharge from the hospital within 2 midnights of admission.   * I certify that at the point of admission it is my clinical judgment that the patient will require inpatient hospital care spanning beyond 2 midnights from the point of  admission due to high intensity of service, high risk for further deterioration and high frequency of surveillance required.*  Author: Andris Baumann, MD 04/30/2023 2:51 AM  For on call review [email or web address].

## 2023-04-30 NOTE — Assessment & Plan Note (Signed)
History of bleeding colonic AVMs 2017 treated with APC Diverticulosis of colon Anemia, uncertain acuity Serial H&H and transfuse as needed Clear liquid diet-per GI Formal GI consult

## 2023-04-30 NOTE — Progress Notes (Signed)
89 year old pleasant lady who was admitted after midnight under hospitalist service for bright red blood per rectum noted at facility.  Hemoglobin 11.8 upon presentation and repeat hemoglobin again today is 11.8, previously was 12.9 a year ago.  Patient has a history of internal hemorrhoids.  Patient seen and examined in the ED.  She has not had any further rectal bleeding since presentation to the ED.  She is hemodynamic stable without any symptoms.  GI has seen her already.  Plan to follow conservative approach, follow serial hemoglobins and transfuse as needed and keep on clear liquid diet and avoid intervention as much as possible due to advanced age.  Obtain CTA if evidence of excessive rectal bleeding and instability.

## 2023-04-30 NOTE — Assessment & Plan Note (Signed)
 Renal function at baseline

## 2023-04-30 NOTE — Assessment & Plan Note (Signed)
Sliding scale insulin coverage 

## 2023-05-01 DIAGNOSIS — K921 Melena: Secondary | ICD-10-CM | POA: Diagnosis not present

## 2023-05-01 DIAGNOSIS — N183 Chronic kidney disease, stage 3 unspecified: Secondary | ICD-10-CM

## 2023-05-01 DIAGNOSIS — E119 Type 2 diabetes mellitus without complications: Secondary | ICD-10-CM

## 2023-05-01 DIAGNOSIS — I5089 Other heart failure: Secondary | ICD-10-CM | POA: Diagnosis not present

## 2023-05-01 LAB — BASIC METABOLIC PANEL
Anion gap: 9 (ref 5–15)
BUN: 18 mg/dL (ref 8–23)
CO2: 26 mmol/L (ref 22–32)
Calcium: 9.3 mg/dL (ref 8.9–10.3)
Chloride: 105 mmol/L (ref 98–111)
Creatinine, Ser: 0.9 mg/dL (ref 0.44–1.00)
GFR, Estimated: 59 mL/min — ABNORMAL LOW (ref >60)
Glucose, Bld: 76 mg/dL (ref 70–99)
Potassium: 4.1 mmol/L (ref 3.5–5.1)
Sodium: 140 mmol/L (ref 135–145)

## 2023-05-01 LAB — CBC
HCT: 36.7 % (ref 36.0–46.0)
Hemoglobin: 11.4 g/dL — ABNORMAL LOW (ref 12.0–15.0)
MCH: 28.7 pg (ref 26.0–34.0)
MCHC: 31.1 g/dL (ref 30.0–36.0)
MCV: 92.4 fL (ref 80.0–100.0)
Platelets: 177 10*3/uL (ref 150–400)
RBC: 3.97 MIL/uL (ref 3.87–5.11)
RDW: 14.6 % (ref 11.5–15.5)
WBC: 6.3 10*3/uL (ref 4.0–10.5)
nRBC: 0 % (ref 0.0–0.2)

## 2023-05-01 LAB — GLUCOSE, CAPILLARY
Glucose-Capillary: 81 mg/dL (ref 70–99)
Glucose-Capillary: 98 mg/dL (ref 70–99)
Glucose-Capillary: 76 mg/dL (ref 70–99)
Glucose-Capillary: 107 mg/dL — ABNORMAL HIGH (ref 70–99)

## 2023-05-01 NOTE — Progress Notes (Addendum)
[facility] Progress Note  CC:  Acute lower GI bleed  Subjective: She was sleeping fairly soundly and I awakened her at this time.  She was slightly confused and stated she was home.  Her RN stated she was not confused earlier this morning when she got up to the bedside commode to urinate.  Her RN called her son at this time and he stated his mother intermittently has mild confusion typically early in the morning which improves as she awakens.  No melena/rectal bleeding or BM yesterday or thus far today.   Objective:  Vital signs in last 24 hours: Temp:  [97.3 F (36.3 C)-98.2 F (36.8 C)] 97.5 F (36.4 C) (12/19 0442) Pulse Rate:  [58-79] 79 (12/19 0442) Resp:  [16-20] 20 (12/19 0442) BP: (98-160)/(38-88) 107/88 (12/19 0442) SpO2:  [94 %-98 %] 95 % (12/19 0442) Last BM Date : 04/30/23 General: 89 year old female in no acute distress.  Hard of hearing. Heart: Regular rate and rhythm, no murmurs. Chest: Bilateral bony prominence to the supraclavicular notch R > L.  Pulm: Breath sounds clear throughout. Abdomen: Soft, nondistended.  Positive bowel sounds to all 4 quadrants.  No palpable mass.  No bruit. Extremities: Mild lower extremity edema.  Patch of ecchymosis to the right upper arm. Neurologic:  Alert and to name and year.  She stated she was at home.  She did not know why she was in the hospital.  She could not tell me the month but knew the Christmas holiday was coming soon.  Psych:  Alert and cooperative. Normal mood and affect.  Intake/Output from previous day: No intake/output data recorded. Intake/Output this shift: No intake/output data recorded.  Lab Results: Recent Labs    04/29/23 2230 04/30/23 0500 04/30/23 1847  WBC 8.9  --  6.5  HGB 11.8* 11.8* 12.2  HCT 37.4  --  37.7  PLT 185  --  173   BMET Recent Labs    04/29/23 2230 05/01/23 0436  NA 136 140  K 3.9 4.1  CL 101 105  CO2 25 26  GLUCOSE 124* 76  BUN 35* 18  CREATININE 1.23*  0.90  CALCIUM 8.9 9.3   LFT Recent Labs    04/29/23 2230  PROT 6.4*  ALBUMIN 3.7  AST 24  ALT 14  ALKPHOS 34*  BILITOT 0.7   PT/INR No results for input(s): "LABPROT", "INR" in the last 72 hours. Hepatitis Panel No results for input(s): "HEPBSAG", "HCVAB", "HEPAIGM", "HEPBIGM" in the last 72 hours.  CT ABDOMEN PELVIS W CONTRAST Result Date: 04/29/2023 CLINICAL DATA:  Left lower quadrant pain EXAM: CT ABDOMEN AND PELVIS WITH CONTRAST TECHNIQUE: Multidetector CT imaging of the abdomen and pelvis was performed using the standard protocol following bolus administration of intravenous contrast. RADIATION DOSE REDUCTION: This exam was performed according to the departmental dose-optimization program which includes automated exposure control, adjustment of the mA and/or kV according to patient size and/or use of iterative reconstruction technique. CONTRAST:  80mL OMNIPAQUE IOHEXOL 300 MG/ML  SOLN COMPARISON:  None Available. FINDINGS: Lower chest: No acute abnormality. Hepatobiliary: No focal liver abnormality is seen. No gallstones, gallbladder wall thickening, or biliary dilatation. Pancreas: Unremarkable. No pancreatic ductal dilatation or surrounding inflammatory changes. Spleen: Normal in size without focal abnormality. Adrenals/Urinary Tract: There is a rounded left adrenal nodule measuring 3.8 by 3.5 cm and 22 Hounsfield units. The right adrenal gland, bladder, and kidneys are within normal limits. Stomach/Bowel: Stomach is within normal limits. Appendix appears normal. No  evidence of bowel wall thickening, distention, or inflammatory changes. There are scattered colonic diverticula. Vascular/Lymphatic: Aortic atherosclerosis. No enlarged abdominal or pelvic lymph nodes. Reproductive: Status post hysterectomy. No adnexal masses. Other: No abdominal wall hernia or abnormality. No abdominopelvic ascites. Musculoskeletal: Multilevel degenerative changes affect the spine. There is a healed distal  sacral fracture. IMPRESSION: 1. No acute localizing process in the abdomen or pelvis. 2. 3.8 cm left adrenal nodule. Recommend further evaluation with adrenal protocol CT or MRI. 3. Colonic diverticulosis. Aortic Atherosclerosis (ICD10-I70.0). Electronically Signed   By: Darliss Cheney M.D.   On: 04/29/2023 23:50   Patient Profile: Yolanda [name] is a 89 y.o. female resident of [facility] with history of chronic kidney disease stage III, prior DVT, congestive heart failure, diabetes mellitus, and previous history of GI bleeding who was brought to the emergency room 04/29/2023 after she had a grossly bloody bowel movement.   Assessment / Plan:  89 year old female with painless hematochezia, suspect lower GI bleed secondary to colonic AVMs and possible diverticular bleed. Admission Hg 11.8 (baseline Hg 12.9 one year ago) -> Hg 12.2 yesterday evening. FOBT positive. Prior colonoscopy in 2/17 identified one  medium sized AVM with stigmata of bleeding at the cecum treated with APC and was also noted to have few scattered sigmoid diverticuli and internal hemorrhoids. Temp 97.5 F.  Hemodynamically stable. -Await am CBC results  -Transfuse for Hg < 8 -CTA if she develops active GI bleeding  -Continue clear liquid diet for now, if a.m Hg stable and no signs of active bleeding, may advance to full liquid diet -Continue conservative measures  -No plans for endoscopic evaluation at this time -Await further recommendations per Dr. Leone Payor  Mild confusion this morning upon awakening -RN to continue to monitor, contact hospitalist if patient remains confused  CKD stage III  CHF  Prior history of DVT  DM type II   Principal Problem:   Hematochezia-lower GI bleed Active Problems:   Diverticulosis of colon   CKD (chronic kidney disease), stage III (HCC)   Type 2 diabetes mellitus with diabetic chronic kidney disease (HCC)   Anemia   Personal history of colonic arteriovenous malformation  (AVM)     LOS: 1 day   Arnaldo Natal  05/01/2023, 9:20AM   GI Attending:  I have also seen and evaluated the patient.  No further bleeding. Signing off OK for dc tomorrow if no more bleeding. If she rebleeds let us know and we will come back.  Iva Boop, MD, [facility] See Loretha Stapler on call - gastroenterology for best contact person 05/01/2023 1:49 PM

## 2023-05-01 NOTE — TOC Initial Note (Signed)
Transition of Care [facility]) - Initial/Assessment Note    Patient Details  Name: Yolanda [name] MRN: [id number] Date of Birth: 1926/01/13  Transition of Care [facility]) CM/SW Contact:    Adrian Prows, RN Phone Number: 05/01/2023, 2:21 PM  Clinical Narrative:                 Sherron Monday w/ pt's son Kaybri Mcroberts [phone]); he says pt is in [facility]; they plan for her to return at d/c; pt will need transportation back to facility; she has glasses and HAs bilaterally; pt has walker, and wheelchair; he says his POC at facility is [facility]; spoke w/ Louanna Raw at facility; she says pt is from [facility]; she gave POC [facility] [phone]; Matilde Sprang also says if pt returns on weekend, please contact KD, nursing supervsor; TOC will follow.  Expected Discharge Plan: Assisted Living Barriers to Discharge: Continued Medical Work up   Patient Goals and CMS Choice Patient states their goals for this hospitalization and ongoing recovery are:: pt's son Judene Companion says pt will return to [facility] CMS Medicare.gov Compare Post Acute Care list provided to:: Patient Represenative (must comment) (pt's son Judene Companion)        Expected Discharge Plan and Services   Discharge Planning Services: CM Consult   Living arrangements for the past 2 months: Assisted Living Facility                                      Prior Living Arrangements/Services Living arrangements for the past 2 months: Assisted Living Facility Lives with:: Facility Resident Patient language and need for interpreter reviewed:: Yes Do you feel safe going back to the place where you live?: Yes      Need for Family Participation in Patient Care: Yes (Comment) Care giver support system in place?: Yes (comment) Current home services: DME (walker, wheelchair) Criminal Activity/Legal Involvement Pertinent to Current Situation/Hospitalization: No - Comment as needed  Activities of  Daily Living   ADL Screening (condition at time of admission) Independently performs ADLs?: Yes (appropriate for developmental age) Is the patient deaf or have difficulty hearing?: No Does the patient have difficulty seeing, even when wearing glasses/contacts?: No Does the patient have difficulty concentrating, remembering, or making decisions?: No  Permission Sought/Granted Permission sought to share information with : Case Manager Permission granted to share information with : Yes, Verbal Permission Granted  Share Information with NAME: Case Manager     Permission granted to share info w Relationship: Alicyn Suon (son) [phone]     Emotional Assessment Appearance:: Other (Comment Required (unable to assess) Attitude/Demeanor/Rapport: Unable to Assess Affect (typically observed): Unable to Assess Orientation: :  (unable to assess) Alcohol / Substance Use: Not Applicable Psych Involvement: No (comment)  Admission diagnosis:  Hematochezia [K92.1] Rectal bleeding [K62.5] Patient Active Problem List   Diagnosis Date Noted   Hematochezia-lower GI bleed 04/30/2023   Anemia 04/30/2023   Personal history of colonic arteriovenous malformation (AVM) 04/30/2023   Cognitive impairment 05/08/2022   Hemorrhoids 05/08/2021   Subconjunctival hemorrhage of left eye 07/31/2020   Pressure ulcer, stage 1 06/20/2020   History of drug allergy 09/21/2019   Osteoarthritis involving multiple joints on both sides of body 09/29/2018   PVD (peripheral vascular disease) (HCC) 09/26/2017   Osteopenia of neck of left femur 07/04/2017   Osteoarthritis of spine with radiculopathy, lumbar region 07/04/2017  History of pressure ulcer 07/04/2017   Right hip pain 09/17/2016   Peripheral neuropathy 04/12/2016   Chronic diastolic congestive heart failure (HCC) 12/26/2015   Edema 12/19/2015   Unstable gait 12/16/2015   Type 2 diabetes mellitus with diabetic chronic kidney disease (HCC) 12/16/2015    AVM (arteriovenous malformation) of colon with hemorrhage    Hyperlipidemia 11/04/2015   CKD (chronic kidney disease), stage III (HCC) 11/04/2015   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) 11/03/2015   History of DVT (deep vein thrombosis) 11/03/2015   COLONIC POLYPS 03/26/2005   Diverticulosis of colon 03/26/2005   PCP:  Mahlon Gammon, MD Pharmacy:  No Pharmacies Listed    Social Drivers of Health (SDOH) Social History: SDOH Screenings   Food Insecurity: No Food Insecurity (05/01/2023)  Housing: Low Risk  (05/01/2023)  Transportation Needs: No Transportation Needs (05/01/2023)  Utilities: Not At Risk (05/01/2023)  Alcohol Screen: Low Risk  (07/15/2022)  Depression (PHQ2-9): Low Risk  (04/15/2023)  Financial Resource Strain: Low Risk  (07/15/2022)  Physical Activity: Insufficiently Active (07/15/2022)  Social Connections: Socially Isolated (07/15/2022)  Stress: No Stress Concern Present (07/15/2022)  Tobacco Use: Low Risk  (04/29/2023)   SDOH Interventions: Food Insecurity Interventions: Intervention Not Indicated, Inpatient TOC Housing Interventions: Intervention Not Indicated, Inpatient TOC Transportation Interventions: Intervention Not Indicated, Inpatient TOC Utilities Interventions: Intervention Not Indicated, Inpatient TOC   Readmission Risk Interventions     No data to display

## 2023-05-01 NOTE — Plan of Care (Signed)

## 2023-05-01 NOTE — Plan of Care (Signed)
Patient has had no bloody bowel movements today.  She had ambulated in hall and to [facility] multiple times.  Monitor for bloody stool through night.

## 2023-05-01 NOTE — Progress Notes (Signed)
PROGRESS NOTE    Yolanda [name]  UUV:[id number] DOB: 1925-12-20 DOA: 04/29/2023 PCP: Mahlon Gammon, MD   Brief Narrative:  89 year old pleasant lady who was admitted after midnight under hospitalist service for bright red blood per rectum noted at facility. Hemoglobin 11.8 upon presentation and repeat hemoglobin again today is 11.8, previously was 12.9 a year ago. Patient has a history of internal hemorrhoids.  GI consulted, conservative management at the moment.  Assessment & Plan:   Principal Problem:   Hematochezia-lower GI bleed Active Problems:   Personal history of colonic arteriovenous malformation (AVM)   Diverticulosis of colon   Anemia   CKD (chronic kidney disease), stage III (HCC)   Type 2 diabetes mellitus with diabetic chronic kidney disease (HCC)  Hematochezia/lower GI bleeding/history of colonic AVM in 2017 treated withAPC: Patient has not had any rectal bleeding since admission, hemoglobin remained stable so far.  GI on board, plan to continue conservative management approach and observe further 24 hours, if remains stable, potential discharge tomorrow.  If bleeds, plan for CTA.  Type 2 diabetes mellitus: Continue SSI.  CKD stage IIIa: At baseline.  Underlying dementia: Patient only slightly confused this morning but per nurse, she spoke to the son and he spoke to the son as well and son verified that patient does have history of forgetting dates or months at times so based on that information, she is at her baseline.  DVT prophylaxis: SCDs Start: 04/30/23 0255   Code Status: Limited: Do not attempt resuscitation (DNR) -DNR-LIMITED -Do Not Intubate/DNI   Family Communication:  None present at bedside.   Status is: Inpatient Remains inpatient appropriate because: Needs to remain in the hospital for another 24 hours for observation.   Estimated body mass index is 26.68 kg/m as calculated from the following:   Height as of 04/15/23: 5\' 3"  (1.6 m).   Weight  as of 04/15/23: 68.3 kg.  Pressure Ulcer 11/05/15 Stage II -  Partial thickness loss of dermis presenting as a shallow open ulcer with a red, pink wound bed without slough. approx.4cm open skin injury on inner lt buttock/remaining inner butt&sacral area red (Active)  11/05/15 1500 (after removing allevyn pad-pt reported"that pressure sore has been there")  Location: Buttocks  Location Orientation: Left  Staging: Stage II -  Partial thickness loss of dermis presenting as a shallow open ulcer with a red, pink wound bed without slough.  Wound Description (Comments): approx.4cm open skin injury on inner lt buttock/remaining inner butt&sacral area red  Present on Admission: Yes (reported pt)     Pressure Injury 04/30/23 Buttocks Right;Medial Stage 1 -  Intact skin with non-blanchable redness of a localized area usually over a bony prominence. (Active)  04/30/23 1812  Location: Buttocks  Location Orientation: Right;Medial  Staging: Stage 1 -  Intact skin with non-blanchable redness of a localized area usually over a bony prominence.  Wound Description (Comments):   Present on Admission: Yes  Dressing Type Foam - Lift dressing to assess site every shift 05/01/23 0800     Pressure Injury 04/30/23 Buttocks Left;Medial Stage 1 -  Intact skin with non-blanchable redness of a localized area usually over a bony prominence. (Active)  04/30/23 1812  Location: Buttocks  Location Orientation: Left;Medial  Staging: Stage 1 -  Intact skin with non-blanchable redness of a localized area usually over a bony prominence.  Wound Description (Comments):   Present on Admission: Yes  Dressing Type Foam - Lift dressing to assess site every shift 05/01/23 0800  Pressure Injury 04/30/23 Heel Left;Posterior Unstageable - Full thickness tissue loss in which the base of the injury is covered by slough (yellow, tan, gray, green or brown) and/or eschar (tan, brown or black) in the wound bed. (Active)  04/30/23 1812   Location: Heel  Location Orientation: Left;Posterior  Staging: Unstageable - Full thickness tissue loss in which the base of the injury is covered by slough (yellow, tan, gray, green or brown) and/or eschar (tan, brown or black) in the wound bed.  Wound Description (Comments):   Present on Admission: Yes  Dressing Type Foam - Lift dressing to assess site every shift 05/01/23 0800   Nutritional Assessment: There is no height or weight on file to calculate BMI.. Seen by dietician.  I agree with the assessment and plan as outlined below: Nutrition Status:        . Skin Assessment: I have examined the patient's skin and I agree with the wound assessment as performed by the wound care RN as outlined below: Pressure Ulcer 11/05/15 Stage II -  Partial thickness loss of dermis presenting as a shallow open ulcer with a red, pink wound bed without slough. approx.4cm open skin injury on inner lt buttock/remaining inner butt&sacral area red (Active)  11/05/15 1500 (after removing allevyn pad-pt reported"that pressure sore has been there")  Location: Buttocks  Location Orientation: Left  Staging: Stage II -  Partial thickness loss of dermis presenting as a shallow open ulcer with a red, pink wound bed without slough.  Wound Description (Comments): approx.4cm open skin injury on inner lt buttock/remaining inner butt&sacral area red  Present on Admission: Yes (reported pt)     Pressure Injury 04/30/23 Buttocks Right;Medial Stage 1 -  Intact skin with non-blanchable redness of a localized area usually over a bony prominence. (Active)  04/30/23 1812  Location: Buttocks  Location Orientation: Right;Medial  Staging: Stage 1 -  Intact skin with non-blanchable redness of a localized area usually over a bony prominence.  Wound Description (Comments):   Present on Admission: Yes  Dressing Type Foam - Lift dressing to assess site every shift 05/01/23 0800     Pressure Injury 04/30/23 Buttocks  Left;Medial Stage 1 -  Intact skin with non-blanchable redness of a localized area usually over a bony prominence. (Active)  04/30/23 1812  Location: Buttocks  Location Orientation: Left;Medial  Staging: Stage 1 -  Intact skin with non-blanchable redness of a localized area usually over a bony prominence.  Wound Description (Comments):   Present on Admission: Yes  Dressing Type Foam - Lift dressing to assess site every shift 05/01/23 0800     Pressure Injury 04/30/23 Heel Left;Posterior Unstageable - Full thickness tissue loss in which the base of the injury is covered by slough (yellow, tan, gray, green or brown) and/or eschar (tan, brown or black) in the wound bed. (Active)  04/30/23 1812  Location: Heel  Location Orientation: Left;Posterior  Staging: Unstageable - Full thickness tissue loss in which the base of the injury is covered by slough (yellow, tan, gray, green or brown) and/or eschar (tan, brown or black) in the wound bed.  Wound Description (Comments):   Present on Admission: Yes  Dressing Type Foam - Lift dressing to assess site every shift 05/01/23 0800    Consultants:  GI  Procedures:  None  Antimicrobials:  Anti-infectives (From admission, onward)    None         Subjective: Seen and examined.  She has no complaints.  No reports of further  rectal bleeding since presentation to the ED.  Objective: Vitals:   04/30/23 1756 04/30/23 2132 05/01/23 0220 05/01/23 0442  BP: (!) 160/67 (!) 148/53 (!) 129/50 107/88  Pulse: 79 64 66 79  Resp: 20 20 16 20   Temp: 98.2 F (36.8 C) 98.1 F (36.7 C) 97.7 F (36.5 C) (!) 97.5 F (36.4 C)  TempSrc: Oral Oral Oral Oral  SpO2: 98% 97% 96% 95%   No intake or output data in the 24 hours ending 05/01/23 1153 There were no vitals filed for this visit.  Examination:  General exam: Appears calm and comfortable  Respiratory system: Clear to auscultation. Respiratory effort normal. Cardiovascular system: S1 & S2 heard,  RRR. No JVD, murmurs, rubs, gallops or clicks. No pedal edema. Gastrointestinal system: Abdomen is nondistended, soft and nontender. No organomegaly or masses felt. Normal bowel sounds heard. Central nervous system: Alert and oriented x 2. No focal neurological deficits. Extremities: Symmetric 5 x 5 power. Skin: No rashes, lesions or ulcers  Data Reviewed: I have personally reviewed following labs and imaging studies  CBC: Recent Labs  Lab 04/29/23 2230 04/30/23 0500 04/30/23 1847 05/01/23 1103  WBC 8.9  --  6.5 6.3  NEUTROABS  --   --  4.0  --   HGB 11.8* 11.8* 12.2 11.4*  HCT 37.4  --  37.7 36.7  MCV 91.2  --  90.2 92.4  PLT 185  --  173 177   Basic Metabolic Panel: Recent Labs  Lab 04/29/23 2230 05/01/23 0436  NA 136 140  K 3.9 4.1  CL 101 105  CO2 25 26  GLUCOSE 124* 76  BUN 35* 18  CREATININE 1.23* 0.90  CALCIUM 8.9 9.3   GFR: CrCl cannot be calculated (Unknown ideal weight.). Liver Function Tests: Recent Labs  Lab 04/29/23 2230  AST 24  ALT 14  ALKPHOS 34*  BILITOT 0.7  PROT 6.4*  ALBUMIN 3.7   Recent Labs  Lab 04/29/23 2231  LIPASE 33   No results for input(s): "AMMONIA" in the last 168 hours. Coagulation Profile: No results for input(s): "INR", "PROTIME" in the last 168 hours. Cardiac Enzymes: No results for input(s): "CKTOTAL", "CKMB", "CKMBINDEX", "TROPONINI" in the last 168 hours. BNP (last 3 results) No results for input(s): "PROBNP" in the last 8760 hours. HbA1C: Recent Labs    04/30/23 0500  HGBA1C 5.7*   CBG: Recent Labs  Lab 04/30/23 0829 04/30/23 1232 04/30/23 1702 04/30/23 2055 05/01/23 0800  GLUCAP 109* 95 95 129* 81   Lipid Profile: No results for input(s): "CHOL", "HDL", "LDLCALC", "TRIG", "CHOLHDL", "LDLDIRECT" in the last 72 hours. Thyroid Function Tests: No results for input(s): "TSH", "T4TOTAL", "FREET4", "T3FREE", "THYROIDAB" in the last 72 hours. Anemia Panel: No results for input(s): "VITAMINB12", "FOLATE",  "FERRITIN", "TIBC", "IRON", "RETICCTPCT" in the last 72 hours. Sepsis Labs: No results for input(s): "PROCALCITON", "LATICACIDVEN" in the last 168 hours.  No results found for this or any previous visit (from the past 240 hours).   Radiology Studies: CT ABDOMEN PELVIS W CONTRAST Result Date: 04/29/2023 CLINICAL DATA:  Left lower quadrant pain EXAM: CT ABDOMEN AND PELVIS WITH CONTRAST TECHNIQUE: Multidetector CT imaging of the abdomen and pelvis was performed using the standard protocol following bolus administration of intravenous contrast. RADIATION DOSE REDUCTION: This exam was performed according to the departmental dose-optimization program which includes automated exposure control, adjustment of the mA and/or kV according to patient size and/or use of iterative reconstruction technique. CONTRAST:  80mL OMNIPAQUE IOHEXOL 300 MG/ML  SOLN COMPARISON:  None Available. FINDINGS: Lower chest: No acute abnormality. Hepatobiliary: No focal liver abnormality is seen. No gallstones, gallbladder wall thickening, or biliary dilatation. Pancreas: Unremarkable. No pancreatic ductal dilatation or surrounding inflammatory changes. Spleen: Normal in size without focal abnormality. Adrenals/Urinary Tract: There is a rounded left adrenal nodule measuring 3.8 by 3.5 cm and 22 Hounsfield units. The right adrenal gland, bladder, and kidneys are within normal limits. Stomach/Bowel: Stomach is within normal limits. Appendix appears normal. No evidence of bowel wall thickening, distention, or inflammatory changes. There are scattered colonic diverticula. Vascular/Lymphatic: Aortic atherosclerosis. No enlarged abdominal or pelvic lymph nodes. Reproductive: Status post hysterectomy. No adnexal masses. Other: No abdominal wall hernia or abnormality. No abdominopelvic ascites. Musculoskeletal: Multilevel degenerative changes affect the spine. There is a healed distal sacral fracture. IMPRESSION: 1. No acute localizing process in  the abdomen or pelvis. 2. 3.8 cm left adrenal nodule. Recommend further evaluation with adrenal protocol CT or MRI. 3. Colonic diverticulosis. Aortic Atherosclerosis (ICD10-I70.0). Electronically Signed   By: Darliss Cheney M.D.   On: 04/29/2023 23:50    Scheduled Meds:  calcium carbonate  2 tablet Oral [id number]   cholecalciferol  1,000 Units Oral Daily   escitalopram  5 mg Oral Daily   gabapentin  300 mg Oral BID   HYDROcodone-acetaminophen  1 tablet Oral BID   insulin aspart  0-5 Units Subcutaneous QHS   insulin aspart  0-9 Units Subcutaneous TID WC   Continuous Infusions:   LOS: 1 day   Hughie Closs, MD [facility]  05/01/2023, 11:53 AM   *Please note that this is a verbal dictation therefore any spelling or grammatical errors are due to the "Dragon Medical One" system interpretation.  Please page via Amion and do not message via secure chat for urgent patient care matters. Secure chat can be used for non urgent patient care matters.  How to contact the [facility] Attending or Consulting provider 7A - 7P or covering provider during after hours 7P -7A, for this patient?  Check the care team in [facility] and look for a) attending/consulting TRH provider listed and b) the [facility] team listed. Page or secure chat 7A-7P. Log into [email or web address] and use [place]'s universal password to access. If you do not have the password, please contact the hospital operator. Locate the [facility] provider you are looking for under [facility] and page to a number that you can be directly reached. If you still have difficulty reaching the provider, please page the [facility] (Director on Call) for the Hospitalists listed on amion for assistance.

## 2023-05-02 DIAGNOSIS — K921 Melena: Secondary | ICD-10-CM | POA: Diagnosis not present

## 2023-05-02 LAB — CBC WITH DIFFERENTIAL/PLATELET
Abs Immature Granulocytes: 0.01 10*3/uL (ref 0.00–0.07)
Basophils Absolute: 0 10*3/uL (ref 0.0–0.1)
Basophils Relative: 0 %
Eosinophils Absolute: 0.3 10*3/uL (ref 0.0–0.5)
Eosinophils Relative: 5 %
HCT: 39.5 % (ref 36.0–46.0)
Hemoglobin: 12.8 g/dL (ref 12.0–15.0)
Immature Granulocytes: 0 %
Lymphocytes Relative: 32 %
Lymphs Abs: 2.1 10*3/uL (ref 0.7–4.0)
MCH: 29 pg (ref 26.0–34.0)
MCHC: 32.4 g/dL (ref 30.0–36.0)
MCV: 89.6 fL (ref 80.0–100.0)
Monocytes Absolute: 0.5 10*3/uL (ref 0.1–1.0)
Monocytes Relative: 8 %
Neutro Abs: 3.6 10*3/uL (ref 1.7–7.7)
Neutrophils Relative %: 55 %
Platelets: 187 10*3/uL (ref 150–400)
RBC: 4.41 MIL/uL (ref 3.87–5.11)
RDW: 14.6 % (ref 11.5–15.5)
WBC: 6.5 10*3/uL (ref 4.0–10.5)
nRBC: 0 % (ref 0.0–0.2)

## 2023-05-02 LAB — GLUCOSE, CAPILLARY
Glucose-Capillary: 82 mg/dL (ref 70–99)
Glucose-Capillary: 84 mg/dL (ref 70–99)

## 2023-05-02 MED ORDER — HYDROCODONE-ACETAMINOPHEN 5-325 MG PO TABS: 1.0000 | ORAL_TABLET | Freq: Two times a day (BID) | ORAL | 0 refills | Status: DC

## 2023-05-02 NOTE — Discharge Summary (Signed)
Physician Discharge Summary  Yolanda [name] WNU:[id number] DOB: 1925-07-20 DOA: 04/29/2023  PCP: Mahlon Gammon, MD  Admit date: 04/29/2023 Discharge date: 05/02/2023 30 Day Unplanned Readmission Risk Score    Flowsheet Row ED to Hosp-Admission (Current) from 04/29/2023 in [facility] 4TH FLOOR PROGRESSIVE CARE AND UROLOGY  30 Day Unplanned Readmission Risk Score (%) 7.81 Filed at 05/02/2023 0801       This score is the patient's risk of an unplanned readmission within 30 days of being discharged (0 -100%). The score is based on dignosis, age, lab data, medications, orders, and past utilization.   Low:  0-14.9   Medium: 15-21.9   High: 22-29.9   Extreme: 30 and above          Admitted From: Assisted living facility Disposition: Back to assisted living facility  Recommendations for Outpatient Follow-up:  Follow up with PCP in 1-2 weeks Please obtain BMP/CBC in one week Please follow up with your PCP on the following pending results: Unresulted Labs (From admission, onward)    None         Home Health: None Equipment/Devices: None  Discharge Condition: Stable CODE STATUS: DNR Diet recommendation: Cardiac  Subjective: Seen and examined.  No complaints.  Brief/Interim Summary: 89 year old pleasant lady who was admitted after midnight under hospitalist service for bright red blood per rectum noted at facility.  Admitted under hospital service.  Details below.   Hematochezia/lower GI bleeding/history of colonic AVM in 2017 treated withAPC: Patient has not had any rectal bleeding since admission and her hemoglobin remained stable so far.  GI on board, conservative management was followed.  Now that patient has not had any rectal bleed and hemoglobin stable for 48 hours, she is being discharged back to assisted living facility in stable condition.   Type 2 diabetes mellitus: Diet controlled.   CKD stage IIIa: At baseline.   Underlying dementia: Patient only slightly  confused this morning but per nurse, she spoke to the son and he spoke to the son as well and son verified that patient does have history of forgetting dates or months at times so based on that information, she is at her baseline.  Discharge plan was discussed with patient and/or family member and they verbalized understanding and agreed with it.  Discharge Diagnoses:  Principal Problem:   Hematochezia-lower GI bleed Active Problems:   Personal history of colonic arteriovenous malformation (AVM)   Diverticulosis of colon   Anemia   CKD (chronic kidney disease), stage III (HCC)   Type 2 diabetes mellitus with diabetic chronic kidney disease (HCC)    Discharge Instructions   Allergies as of 05/02/2023       Reactions   Epinephrine Nausea Only   Unknown.   Hydrocodone-acetaminophen Nausea Only   Labetalol Other (See Comments)   Reaction not listed on MAR    Procaine Other (See Comments)   Reaction not listed on MAR    Vitamin D3 [cholecalciferol] Other (See Comments)   Allergy not listed on MAR         Medication List     TAKE these medications    acetaminophen 500 MG tablet Commonly known as: TYLENOL Take 500 mg by mouth 2 (two) times daily.   calcium carbonate 500 MG chewable tablet Commonly known as: TUMS - dosed in mg elemental calcium Chew 2 tablets by mouth every morning.   cholecalciferol 25 MCG (1000 UNIT) tablet Commonly known as: VITAMIN D3 Take 1,000 Units by mouth daily.   docusate  sodium 100 MG capsule Commonly known as: COLACE Take 100 mg by mouth every morning.   escitalopram 5 MG tablet Commonly known as: LEXAPRO Take 5 mg by mouth daily.   furosemide 20 MG tablet Commonly known as: LASIX Take 1 tablet (20 mg total) by mouth daily. What changed: when to take this   gabapentin 300 MG capsule Commonly known as: NEURONTIN Take 300 mg by mouth 2 (two) times daily.   HYDROcodone-acetaminophen 5-325 MG tablet Commonly known as:  NORCO/VICODIN Take 1 tablet by mouth 2 (two) times daily. Twice a day 8:00 am,8:00 pm What changed: additional instructions   nystatin powder Commonly known as: MYCOSTATIN/NYSTOP Apply 1 Application topically 2 (two) times daily as needed (irritation in groin area). Apply to groin 2 times daily as needed   PreserVision AREDS Caps Take 1 capsule by mouth in the morning.   THEREMS M PO Take 1 tablet by mouth in the morning.   Procto-Med HC 2.5 % rectal cream Generic drug: hydrocortisone Place 1 Application rectally as needed for hemorrhoids or anal itching. for external rectal hemorrhoids. unsupervised self-administration May self administer   Sodium Fluoride 1.1 % Pste Place 1 Application onto teeth daily.   zinc oxide 20 % ointment Apply 1 Application topically as needed for irritation (INCONTINENCE AND REDNESS.).        Follow-up Information     Mahlon Gammon, MD Follow up in 1 week(s).   Specialty: Internal Medicine Contact information: [address] [place] [address] [phone]                Allergies  Allergen Reactions   Epinephrine Nausea Only    Unknown.   Hydrocodone-Acetaminophen Nausea Only   Labetalol Other (See Comments)    Reaction not listed on MAR    Procaine Other (See Comments)    Reaction not listed on MAR    Vitamin D3 [Cholecalciferol] Other (See Comments)    Allergy not listed on MAR     Consultations: GI   Procedures/Studies: CT ABDOMEN PELVIS W CONTRAST Result Date: 04/29/2023 CLINICAL DATA:  Left lower quadrant pain EXAM: CT ABDOMEN AND PELVIS WITH CONTRAST TECHNIQUE: Multidetector CT imaging of the abdomen and pelvis was performed using the standard protocol following bolus administration of intravenous contrast. RADIATION DOSE REDUCTION: This exam was performed according to the departmental dose-optimization program which includes automated exposure control, adjustment of the mA and/or kV according to patient size  and/or use of iterative reconstruction technique. CONTRAST:  80mL OMNIPAQUE IOHEXOL 300 MG/ML  SOLN COMPARISON:  None Available. FINDINGS: Lower chest: No acute abnormality. Hepatobiliary: No focal liver abnormality is seen. No gallstones, gallbladder wall thickening, or biliary dilatation. Pancreas: Unremarkable. No pancreatic ductal dilatation or surrounding inflammatory changes. Spleen: Normal in size without focal abnormality. Adrenals/Urinary Tract: There is a rounded left adrenal nodule measuring 3.8 by 3.5 cm and 22 Hounsfield units. The right adrenal gland, bladder, and kidneys are within normal limits. Stomach/Bowel: Stomach is within normal limits. Appendix appears normal. No evidence of bowel wall thickening, distention, or inflammatory changes. There are scattered colonic diverticula. Vascular/Lymphatic: Aortic atherosclerosis. No enlarged abdominal or pelvic lymph nodes. Reproductive: Status post hysterectomy. No adnexal masses. Other: No abdominal wall hernia or abnormality. No abdominopelvic ascites. Musculoskeletal: Multilevel degenerative changes affect the spine. There is a healed distal sacral fracture. IMPRESSION: 1. No acute localizing process in the abdomen or pelvis. 2. 3.8 cm left adrenal nodule. Recommend further evaluation with adrenal protocol CT or MRI. 3. Colonic diverticulosis.  Aortic Atherosclerosis (ICD10-I70.0). Electronically Signed   By: Darliss Cheney M.D.   On: 04/29/2023 23:50     Discharge Exam: Vitals:   05/01/23 2008 05/02/23 0422  BP: (!) 141/69 (!) 142/58  Pulse: 77 62  Resp: 16 16  Temp: 97.8 F (36.6 C) 98 F (36.7 C)  SpO2: 97% 92%   Vitals:   05/01/23 0442 05/01/23 1217 05/01/23 2008 05/02/23 0422  BP: 107/88 (!) 122/58 (!) 141/69 (!) 142/58  Pulse: 79 60 77 62  Resp: 20 16 16 16   Temp: (!) 97.5 F (36.4 C) 98 F (36.7 C) 97.8 F (36.6 C) 98 F (36.7 C)  TempSrc: Oral Oral Oral Oral  SpO2: 95% 97% 97% 92%    General: Pt is alert, awake, not  in acute distress Cardiovascular: RRR, S1/S2 +, no rubs, no gallops Respiratory: CTA bilaterally, no wheezing, no rhonchi Abdominal: Soft, NT, ND, bowel sounds + Extremities: no edema, no cyanosis    The results of significant diagnostics from this hospitalization (including imaging, microbiology, ancillary and laboratory) are listed below for reference.     Microbiology: No results found for this or any previous visit (from the past 240 hours).   Labs: BNP (last 3 results) No results for input(s): "BNP" in the last 8760 hours. Basic Metabolic Panel: Recent Labs  Lab 04/29/23 2230 05/01/23 0436  NA 136 140  K 3.9 4.1  CL 101 105  CO2 25 26  GLUCOSE 124* 76  BUN 35* 18  CREATININE 1.23* 0.90  CALCIUM 8.9 9.3   Liver Function Tests: Recent Labs  Lab 04/29/23 2230  AST 24  ALT 14  ALKPHOS 34*  BILITOT 0.7  PROT 6.4*  ALBUMIN 3.7   Recent Labs  Lab 04/29/23 2231  LIPASE 33   No results for input(s): "AMMONIA" in the last 168 hours. CBC: Recent Labs  Lab 04/29/23 2230 04/30/23 0500 04/30/23 1847 05/01/23 1103 05/02/23 0408  WBC 8.9  --  6.5 6.3 6.5  NEUTROABS  --   --  4.0  --  3.6  HGB 11.8* 11.8* 12.2 11.4* 12.8  HCT 37.4  --  37.7 36.7 39.5  MCV 91.2  --  90.2 92.4 89.6  PLT 185  --  173 177 187   Cardiac Enzymes: No results for input(s): "CKTOTAL", "CKMB", "CKMBINDEX", "TROPONINI" in the last 168 hours. BNP: Invalid input(s): "POCBNP" CBG: Recent Labs  Lab 05/01/23 0800 05/01/23 1212 05/01/23 1647 05/01/23 2052 05/02/23 0740  GLUCAP 81 98 76 107* 82   D-Dimer No results for input(s): "DDIMER" in the last 72 hours. Hgb A1c Recent Labs    04/30/23 0500  HGBA1C 5.7*   Lipid Profile No results for input(s): "CHOL", "HDL", "LDLCALC", "TRIG", "CHOLHDL", "LDLDIRECT" in the last 72 hours. Thyroid function studies No results for input(s): "TSH", "T4TOTAL", "T3FREE", "THYROIDAB" in the last 72 hours.  Invalid input(s): "FREET3" Anemia  work up No results for input(s): "VITAMINB12", "FOLATE", "FERRITIN", "TIBC", "IRON", "RETICCTPCT" in the last 72 hours. Urinalysis    Component Value Date/Time   COLORURINE YELLOW 04/30/2023 0142   APPEARANCEUR CLEAR 04/30/2023 0142   LABSPEC 1.030 04/30/2023 0142   PHURINE 5.0 04/30/2023 0142   GLUCOSEU NEGATIVE 04/30/2023 0142   HGBUR LARGE (A) 04/30/2023 0142   BILIRUBINUR NEGATIVE 04/30/2023 0142   KETONESUR NEGATIVE 04/30/2023 0142   PROTEINUR NEGATIVE 04/30/2023 0142   NITRITE NEGATIVE 04/30/2023 0142   LEUKOCYTESUR SMALL (A) 04/30/2023 0142   Sepsis Labs Recent Labs  Lab 04/29/23 2230 04/30/23 1847  05/01/23 1103 05/02/23 0408  WBC 8.9 6.5 6.3 6.5   Microbiology No results found for this or any previous visit (from the past 240 hours).  FURTHER DISCHARGE INSTRUCTIONS:   Get Medicines reviewed and adjusted: Please take all your medications with you for your next visit with your Primary MD   Laboratory/radiological data: Please request your Primary MD to go over all hospital tests and procedure/radiological results at the follow up, please ask your Primary MD to get all Hospital records sent to his/her office.   In some cases, they will be blood work, cultures and biopsy results pending at the time of your discharge. Please request that your primary care M.D. goes through all the records of your hospital data and follows up on these results.   Also Note the following: If you experience worsening of your admission symptoms, develop shortness of breath, life threatening emergency, suicidal or homicidal thoughts you must seek medical attention immediately by calling 911 or calling your MD immediately  if symptoms less severe.   You must read complete instructions/literature along with all the possible adverse reactions/side effects for all the Medicines you take and that have been prescribed to you. Take any new Medicines after you have completely understood and accpet all  the possible adverse reactions/side effects.    Do not drive when taking Pain medications or sleeping medications (Benzodaizepines)   Do not take more than prescribed Pain, Sleep and Anxiety Medications. It is not advisable to combine anxiety,sleep and pain medications without talking with your primary care practitioner   Special Instructions: If you have smoked or chewed Tobacco  in the last 2 yrs please stop smoking, stop any regular Alcohol  and or any Recreational drug use.   Wear Seat belts while driving.   Please note: You were cared for by a hospitalist during your hospital stay. Once you are discharged, your primary care physician will handle any further medical issues. Please note that NO REFILLS for any discharge medications will be authorized once you are discharged, as it is imperative that you return to your primary care physician (or establish a relationship with a primary care physician if you do not have one) for your post hospital discharge needs so that they can reassess your need for medications and monitor your lab values  Time coordinating discharge: Over 30 minutes  SIGNED:   Hughie Closs, MD  [facility] 05/02/2023, 9:14 AM *Please note that this is a verbal dictation therefore any spelling or grammatical errors are due to the "Dragon Medical One" system interpretation. If 7PM-7AM, please contact night-coverage [email or web address]

## 2023-05-02 NOTE — NC FL2 (Signed)
[place] MEDICAID FL2 LEVEL OF CARE FORM     IDENTIFICATION  Patient Name: Yolanda [name] Birthdate: Jan 09, 1926 Sex: female Admission Date (Current Location): 04/29/2023  [facility] [place] Number:      Facility and Address:  [facility],  [address], [place] [address]      Provider Number: [id number]  Attending Physician Name and Address:  Hughie Closs, MD  Relative Name and Phone Number:  Azriel, Mckeller (Son)  [phone] [facility])    Current Level of Care: Hospital Recommended Level of Care: Assisted Living Facility Prior Approval Number:    Date Approved/Denied:   PASRR Number:    Discharge Plan: Other (Comment) (ALF)    Current Diagnoses: Patient Active Problem List   Diagnosis Date Noted   Hematochezia-lower GI bleed 04/30/2023   Anemia 04/30/2023   Personal history of colonic arteriovenous malformation (AVM) 04/30/2023   Cognitive impairment 05/08/2022   Hemorrhoids 05/08/2021   Subconjunctival hemorrhage of left eye 07/31/2020   Pressure ulcer, stage 1 06/20/2020   History of drug allergy 09/21/2019   Osteoarthritis involving multiple joints on both sides of body 09/29/2018   PVD (peripheral vascular disease) (HCC) 09/26/2017   Osteopenia of neck of left femur 07/04/2017   Osteoarthritis of spine with radiculopathy, lumbar region 07/04/2017   History of pressure ulcer 07/04/2017   Right hip pain 09/17/2016   Peripheral neuropathy 04/12/2016   Chronic diastolic congestive heart failure (HCC) 12/26/2015   Edema 12/19/2015   Unstable gait 12/16/2015   Type 2 diabetes mellitus with diabetic chronic kidney disease (HCC) 12/16/2015   AVM (arteriovenous malformation) of colon with hemorrhage    Hyperlipidemia 11/04/2015   CKD (chronic kidney disease), stage III (HCC) 11/04/2015   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) 11/03/2015   History of DVT (deep vein thrombosis) 11/03/2015   COLONIC  POLYPS 03/26/2005   Diverticulosis of colon 03/26/2005    Orientation RESPIRATION BLADDER Height & Weight     Self, Time, Situation, Place  Normal Continent Weight:   Height:     BEHAVIORAL SYMPTOMS/MOOD NEUROLOGICAL BOWEL NUTRITION STATUS      Continent Diet (clear liquid)  AMBULATORY STATUS COMMUNICATION OF NEEDS Skin   Supervision Verbally PU Stage and Appropriate Care (buttocks)                       Personal Care Assistance Level of Assistance  Bathing, Feeding, Dressing Bathing Assistance: Limited assistance Feeding assistance: Independent Dressing Assistance: Limited assistance     Functional Limitations Info  Sight, Hearing, Speech Sight Info: Impaired (glasses) Hearing Info: Adequate Speech Info: Adequate    SPECIAL CARE FACTORS FREQUENCY  PT (By licensed PT), OT (By licensed OT)     PT Frequency: 5 x a week OT Frequency: 5 x a week            Contractures Contractures Info: Not present    Additional Factors Info  Code Status, Allergies, Psychotropic Code Status Info: DNR Allergies Info: Epinephrine  Hydrocodone-acetaminophen  Labetalol  Procaine  Vitamin D3 (Cholecalciferol) Psychotropic Info: escitalopram (LEXAPRO) tablet 5 mg         Current Medications (05/02/2023):  This is the current hospital active medication list Current Facility-Administered Medications  Medication Dose Route Frequency Provider Last Rate Last Admin   acetaminophen (TYLENOL) tablet 650 mg  650 mg Oral Q6H PRN Andris Baumann, MD       Or   acetaminophen (TYLENOL) suppository  650 mg  650 mg Rectal Q6H PRN Andris Baumann, MD       acetaminophen (TYLENOL) tablet 650 mg  650 mg Oral Q6H PRN Hughie Closs, MD       calcium carbonate (TUMS - dosed in mg elemental calcium) chewable tablet 400 mg of elemental calcium  2 tablet Oral Tonia Ghent, Daleen Bo, MD   400 mg of elemental calcium at 05/02/23 7564   cholecalciferol (VITAMIN D3) 25 MCG (1000 UNIT) tablet 1,000 Units  1,000  Units Oral Daily Hughie Closs, MD   1,000 Units at 05/01/23 0923   escitalopram (LEXAPRO) tablet 5 mg  5 mg Oral Daily Hughie Closs, MD   5 mg at 05/01/23 3329   gabapentin (NEURONTIN) capsule 300 mg  300 mg Oral BID Hughie Closs, MD   300 mg at 05/01/23 2149   HYDROcodone-acetaminophen (NORCO/VICODIN) 5-325 MG per tablet 1 tablet  1 tablet Oral BID Andris Baumann, MD   1 tablet at 05/01/23 5188   hydrocortisone (ANUSOL-HC) 2.5 % rectal cream 1 Application  1 Application Rectal PRN Hughie Closs, MD       insulin aspart (novoLOG) injection 0-5 Units  0-5 Units Subcutaneous QHS Lindajo Royal V, MD       insulin aspart (novoLOG) injection 0-9 Units  0-9 Units Subcutaneous TID WC Andris Baumann, MD       ondansetron [facility]) tablet 4 mg  4 mg Oral Q6H PRN Andris Baumann, MD       Or   ondansetron [facility]) injection 4 mg  4 mg Intravenous Q6H PRN Andris Baumann, MD         Discharge Medications: Please see discharge summary for a list of discharge medications.  Relevant Imaging Results:  Relevant Lab Results:   Additional Information SSN:[id number]  Valentina Shaggy Anhthu Perdew, LCSW

## 2023-05-02 NOTE — TOC Transition Note (Signed)
Transition of Care [facility]) - Discharge Note   Patient Details  Name: Yolanda [name] MRN: [id number] Date of Birth: 26-Dec-1925  [facility] CM/SW Contact:  Larrie Kass, LCSW Phone Number: 05/02/2023, 9:35 AM   Clinical Narrative:   pt to d/c back to [facility]. CSW spoke with pt's son to inform him of transfer. RN to call report to [phone], pt's room number 23. PTAR called no additional TOC needs, TOC sign off.    Final next level of care: Assisted Living Barriers to Discharge: No Barriers Identified   Patient Goals and CMS Choice Patient states their goals for this hospitalization and ongoing recovery are:: retrun to ALF CMS Medicare.gov Compare Post Acute Care list provided to:: Patient Represenative (must comment)        Discharge Placement                  Name of family member notified: Brotherton,Leonard (Son)  [phone] [facility]) Patient and family notified of of transfer: 05/02/23  Discharge Plan and Services Additional resources added to the After Visit Summary for     Discharge Planning Services: CM Consult                                 Social Drivers of Health (SDOH) Interventions SDOH Screenings   Food Insecurity: No Food Insecurity (05/01/2023)  Housing: Low Risk  (05/01/2023)  Transportation Needs: No Transportation Needs (05/01/2023)  Utilities: Not At Risk (05/01/2023)  Alcohol Screen: Low Risk  (07/15/2022)  Depression (PHQ2-9): Low Risk  (04/15/2023)  Financial Resource Strain: Low Risk  (07/15/2022)  Physical Activity: Insufficiently Active (07/15/2022)  Social Connections: Socially Isolated (07/15/2022)  Stress: No Stress Concern Present (07/15/2022)  Tobacco Use: Low Risk  (04/29/2023)     Readmission Risk Interventions     No data to display

## 2023-05-05 ENCOUNTER — Non-Acute Institutional Stay: Payer: Medicare Other | Admitting: Orthopedic Surgery

## 2023-05-05 ENCOUNTER — Encounter: Payer: Self-pay | Admitting: Orthopedic Surgery

## 2023-05-05 DIAGNOSIS — M159 Polyosteoarthritis, unspecified: Secondary | ICD-10-CM | POA: Diagnosis not present

## 2023-05-05 DIAGNOSIS — E1122 Type 2 diabetes mellitus with diabetic chronic kidney disease: Secondary | ICD-10-CM

## 2023-05-05 DIAGNOSIS — G3184 Mild cognitive impairment, so stated: Secondary | ICD-10-CM

## 2023-05-05 DIAGNOSIS — L89622 Pressure ulcer of left heel, stage 2: Secondary | ICD-10-CM | POA: Diagnosis not present

## 2023-05-05 DIAGNOSIS — K625 Hemorrhage of anus and rectum: Secondary | ICD-10-CM

## 2023-05-05 DIAGNOSIS — E279 Disorder of adrenal gland, unspecified: Secondary | ICD-10-CM

## 2023-05-05 DIAGNOSIS — D649 Anemia, unspecified: Secondary | ICD-10-CM | POA: Diagnosis not present

## 2023-05-05 DIAGNOSIS — K5730 Diverticulosis of large intestine without perforation or abscess without bleeding: Secondary | ICD-10-CM

## 2023-05-05 DIAGNOSIS — N1831 Chronic kidney disease, stage 3a: Secondary | ICD-10-CM

## 2023-05-05 DIAGNOSIS — I10 Essential (primary) hypertension: Secondary | ICD-10-CM | POA: Diagnosis not present

## 2023-05-05 LAB — BASIC METABOLIC PANEL WITH GFR
BUN: 30 — AB (ref 4–21)
CO2: 30 — AB (ref 13–22)
Chloride: 106 (ref 99–108)
Creatinine: 1.2 — AB (ref 0.5–1.1)
Glucose: 82
Potassium: 4 meq/L (ref 3.5–5.1)
Sodium: 141 (ref 137–147)

## 2023-05-05 LAB — CBC AND DIFFERENTIAL
HCT: 37 (ref 36–46)
Hemoglobin: 11.9 — AB (ref 12.0–16.0)
Platelets: 199 K/uL (ref 150–400)
WBC: 5.5

## 2023-05-05 LAB — COMPREHENSIVE METABOLIC PANEL WITH GFR
Calcium: 8.9 (ref 8.7–10.7)
eGFR: 40

## 2023-05-05 LAB — CBC: RBC: 4.13 (ref 3.87–5.11)

## 2023-05-05 NOTE — Progress Notes (Unsigned)
Location:  [facility] Room Number: 23-A Place of Service:  ALF [phone]) Provider: Hazle Nordmann, NP  Code Status: DNR Goals of Care:     05/05/2023    9:45 AM  Advanced Directives  Does Patient Have a Medical Advance Directive? Yes  Type of Estate agent of [place];Living will;Out of facility DNR (pink MOST or yellow form)  Does patient want to make changes to medical advance directive? No - Patient declined  Copy of Healthcare Power of Attorney in Chart? Yes - validated most recent copy scanned in chart (See row information)     Chief Complaint  Patient presents with   Hospitalization Follow-up    HPI: Patient is a 89 y.o. female seen today for hospital follow-up s/p admission from [facility] 12/17-12/20.   She currently resides on the assisted living unit at [facility]. PMH: CHF, PVD,  hx of DVT, diverticulosis, T2DM, OA spine, peripheral neuropathy, and CKD III.   12/17 nursing found a large amount of blood in commode, SBP in 80's. H/o GI bleed/ colonic AVM 2017> resolved with APC. She was sent to ED for evaluation. Asymptomatic. BP 139/56. Hgb was 11.8, other labs unremarkable. CT abdomen with no acute abnormality, 3.8 mm left adrenal nodule noted, f/u MRI recommended. GI was consulted> clear liquid diet and conservation management recommended due to age. She did not have any additional episodes of rectal bleeding and hemoglobin remained stable. She was discharged back to AL at [facility].   Today, no further episodes of rectal bleeding. Blood pressure has been stable. Tolerating regular diet. She is poor historian of hospital stay, but remembers she was in hospital. Left heel noted with stage II pressure ulcer per nursing. Afebrile. Vitals stable.   Past Medical History:  Diagnosis Date   Acute blood loss anemia    Allergy    Asthma    AVM (arteriovenous malformation) of colon with hemorrhage    Bursitis of right hip    Cataract    CKD  (chronic kidney disease), stage III (HCC) 11/04/2015   COLONIC POLYPS 03/26/2005   Qualifier: Diagnosis of  By: Glyn Ade CMA (AAMA), June     DIVERTICULOSIS, COLON 03/26/2005   Qualifier: Diagnosis of  By: Glyn Ade CMA Duncan Dull), June     DVT (deep venous thrombosis), left 11/03/2015   Ecchymosis 12/16/2015   Hyperglycemia 12/16/2015   Hyperlipidemia    Hypertension    Multiple skin tears 12/16/2015   Osteopenia    Stasis edema of both lower extremities 11/04/2015   Unstable gait 12/16/2015   Urine incontinence 12/16/2015    Past Surgical History:  Procedure Laterality Date   ABDOMINAL HYSTERECTOMY  2006   Dr. Delia Heady   CATARACT EXTRACTION W/ INTRAOCULAR LENS IMPLANT Bilateral 1999/2003   Dr. Jethro Bolus   COLONOSCOPY N/A 11/06/2015   Procedure: COLONOSCOPY;  Surgeon: Napoleon Form, MD;  Location: [facility] ENDOSCOPY;  Service: Endoscopy;  Laterality: N/A;  MAC if available, otherwise moderate sedation    Allergies  Allergen Reactions   Epinephrine Nausea Only    Unknown.   Hydrocodone-Acetaminophen Nausea Only   Labetalol Other (See Comments)    Reaction not listed on MAR    Procaine Other (See Comments)    Reaction not listed on MAR    Vitamin D3 [Cholecalciferol] Other (See Comments)    Allergy not listed on [facility]     Outpatient Encounter Medications as of 05/05/2023  Medication Sig   acetaminophen (TYLENOL) 500 MG tablet  Take 500 mg by mouth 2 (two) times daily.    calcium carbonate (TUMS - DOSED IN MG ELEMENTAL CALCIUM) 500 MG chewable tablet Chew 2 tablets by mouth every morning.    cholecalciferol (VITAMIN D3) 25 MCG (1000 UNIT) tablet Take 1,000 Units by mouth daily.   docusate sodium (COLACE) 100 MG capsule Take 100 mg by mouth every morning.   escitalopram (LEXAPRO) 5 MG tablet Take 5 mg by mouth daily.   furosemide (LASIX) 20 MG tablet Take 1 tablet (20 mg total) by mouth daily.   gabapentin (NEURONTIN) 300 MG capsule Take 300 mg by mouth 2 (two) times daily.    HYDROcodone-acetaminophen (NORCO/VICODIN) 5-325 MG tablet Take 1 tablet by mouth 2 (two) times daily. Twice a day 8:00 am,8:00 pm   hydrocortisone (PROCTO-MED HC) 2.5 % rectal cream Place 1 Application rectally as needed for hemorrhoids or anal itching. for external rectal hemorrhoids. unsupervised self-administration May self administer   Multiple Vitamins-Minerals (PRESERVISION AREDS) CAPS Take 1 capsule by mouth in the morning.   Multiple Vitamins-Minerals (THEREMS M PO) Take 1 tablet by mouth in the morning.   nystatin (MYCOSTATIN/NYSTOP) powder Apply 1 Application topically 2 (two) times daily as needed (irritation in groin area). Apply to groin 2 times daily as needed   Sodium Fluoride 1.1 % PSTE Place 1 Application onto teeth daily.   zinc oxide 20 % ointment Apply 1 Application topically as needed for irritation (INCONTINENCE AND REDNESS.).   No facility-administered encounter medications on file as of 05/05/2023.    Review of Systems:  Review of Systems  Unable to perform ROS: Dementia    Health Maintenance  Topic Date Due   Zoster Vaccines- Shingrix (2 of 2) 06/10/2022   COVID-19 Vaccine (8 - 2024-25 season) 05/14/2023   Medicare Annual Wellness (AWV)  07/15/2023   OPHTHALMOLOGY EXAM  10/14/2023   HEMOGLOBIN A1C  10/29/2023   FOOT EXAM  04/14/2024   DTaP/Tdap/Td (4 - Td or Tdap) 01/05/2027   Pneumonia Vaccine 58+ Years old  Completed   INFLUENZA VACCINE  Completed   DEXA SCAN  Completed   HPV VACCINES  Aged Out    Physical Exam: Vitals:   05/05/23 0940  BP: (!) 113/54  Pulse: 65  Resp: 18  Temp: 98.2 F (36.8 C)  SpO2: 91%  Weight: 150 lb 9.6 oz (68.3 kg)  Height: 5\' 3"  (1.6 m)   Body mass index is 26.68 kg/m. Physical Exam Vitals reviewed.  Constitutional:      General: She is not in acute distress. HENT:     Head: Normocephalic.  Eyes:     General:        Right eye: No discharge.        Left eye: No discharge.  Cardiovascular:     Rate and Rhythm:  Normal rate and regular rhythm.     Pulses: Normal pulses.     Heart sounds: Normal heart sounds.  Pulmonary:     Effort: Pulmonary effort is normal. No respiratory distress.     Breath sounds: Normal breath sounds. No wheezing.  Abdominal:     General: Bowel sounds are normal.  Musculoskeletal:     Cervical back: Neck supple.     Right lower leg: Edema present.     Left lower leg: Edema present.     Comments: Non pitting  Skin:    Findings: Lesion present.     Comments: Stage II pressure ulcer to left heel, pea sized, CDI.   Neurological:  General: No focal deficit present.     Mental Status: She is alert. Mental status is at baseline.     Motor: Weakness present.     Gait: Gait abnormal.     Comments: walker  Psychiatric:        Mood and Affect: Mood normal.     Labs reviewed: Basic Metabolic Panel: Recent Labs    08/02/22 0000 04/29/23 2230 05/01/23 0436  NA 142 136 140  K 4.0 3.9 4.1  CL 106 101 105  CO2 24* 25 26  GLUCOSE  --  124* 76  BUN 29* 35* 18  CREATININE 1.2* 1.23* 0.90  CALCIUM 8.8 8.9 9.3   Liver Function Tests: Recent Labs    04/29/23 2230  AST 24  ALT 14  ALKPHOS 34*  BILITOT 0.7  PROT 6.4*  ALBUMIN 3.7   Recent Labs    04/29/23 2231  LIPASE 33   No results for input(s): "AMMONIA" in the last 8760 hours. CBC: Recent Labs    04/30/23 1847 05/01/23 1103 05/02/23 0408  WBC 6.5 6.3 6.5  NEUTROABS 4.0  --  3.6  HGB 12.2 11.4* 12.8  HCT 37.7 36.7 39.5  MCV 90.2 92.4 89.6  PLT 173 177 187   Lipid Panel: No results for input(s): "CHOL", "HDL", "LDLCALC", "TRIG", "CHOLHDL", "LDLDIRECT" in the last 8760 hours. Lab Results  Component Value Date   HGBA1C 5.7 (H) 04/30/2023    Procedures since last visit: CT ABDOMEN PELVIS W CONTRAST Result Date: 04/29/2023 CLINICAL DATA:  Left lower quadrant pain EXAM: CT ABDOMEN AND PELVIS WITH CONTRAST TECHNIQUE: Multidetector CT imaging of the abdomen and pelvis was performed using the  standard protocol following bolus administration of intravenous contrast. RADIATION DOSE REDUCTION: This exam was performed according to the departmental dose-optimization program which includes automated exposure control, adjustment of the mA and/or kV according to patient size and/or use of iterative reconstruction technique. CONTRAST:  80mL OMNIPAQUE IOHEXOL 300 MG/ML  SOLN COMPARISON:  None Available. FINDINGS: Lower chest: No acute abnormality. Hepatobiliary: No focal liver abnormality is seen. No gallstones, gallbladder wall thickening, or biliary dilatation. Pancreas: Unremarkable. No pancreatic ductal dilatation or surrounding inflammatory changes. Spleen: Normal in size without focal abnormality. Adrenals/Urinary Tract: There is a rounded left adrenal nodule measuring 3.8 by 3.5 cm and 22 Hounsfield units. The right adrenal gland, bladder, and kidneys are within normal limits. Stomach/Bowel: Stomach is within normal limits. Appendix appears normal. No evidence of bowel wall thickening, distention, or inflammatory changes. There are scattered colonic diverticula. Vascular/Lymphatic: Aortic atherosclerosis. No enlarged abdominal or pelvic lymph nodes. Reproductive: Status post hysterectomy. No adnexal masses. Other: No abdominal wall hernia or abnormality. No abdominopelvic ascites. Musculoskeletal: Multilevel degenerative changes affect the spine. There is a healed distal sacral fracture. IMPRESSION: 1. No acute localizing process in the abdomen or pelvis. 2. 3.8 cm left adrenal nodule. Recommend further evaluation with adrenal protocol CT or MRI. 3. Colonic diverticulosis. Aortic Atherosclerosis (ICD10-I70.0). Electronically Signed   By: Darliss Cheney M.D.   On: 04/29/2023 23:50    Assessment/Plan 1. Rectal bleeding (Primary) - hospitalized 12/17-12/20 - h/o GI bleed with AVM 2017 - CT abdomen no acute abnormalities - GI recommended conservative management - no further episodes - hgb 12.8 at  discharge - cbc/diff- future  2. Diverticulosis of colon - noted CT abdomen  3. Essential (primary) hypertension - controlled  - on furosemide   4. Type 2 diabetes mellitus with stage 3a chronic kidney disease, without long-term current use of  insulin (HCC) - A1c 5.7 - diet controlled  5. Anemia, unspecified type - hgb 12.8  6. Osteoarthritis involving multiple joints on both sides of body - stable with tylenol and norco  7. Mild cognitive impairment - MMSE 24/30 04/2022 - followed by ST> cognitive testing soon  8. Stage II pressure ulcer of left heel (HCC) - noted on readmission to AL - cont  skin prep and foam dressing  - float heels in bed  9. Adrenal nodule (HCC) - 3.8 mm left adrenal nodule noted, f/u MRI recommended - will discuss treatment options with family     Labs/tests ordered:  cbc/diff, bmp in 1 week Next appt:  Visit date not found

## 2023-05-09 ENCOUNTER — Other Ambulatory Visit: Payer: Self-pay | Admitting: Orthopedic Surgery

## 2023-05-09 DIAGNOSIS — M159 Polyosteoarthritis, unspecified: Secondary | ICD-10-CM

## 2023-05-09 MED ORDER — HYDROCODONE-ACETAMINOPHEN 5-325 MG PO TABS: 1.0000 | ORAL_TABLET | Freq: Two times a day (BID) | ORAL | 0 refills | Status: DC

## 2023-06-11 ENCOUNTER — Other Ambulatory Visit: Payer: Self-pay | Admitting: Orthopedic Surgery

## 2023-06-11 DIAGNOSIS — M159 Polyosteoarthritis, unspecified: Secondary | ICD-10-CM

## 2023-06-11 MED ORDER — HYDROCODONE-ACETAMINOPHEN 5-325 MG PO TABS
1.0000 | ORAL_TABLET | Freq: Two times a day (BID) | ORAL | 0 refills | Status: DC
Start: 2023-06-11 — End: 2023-07-09

## 2023-07-09 ENCOUNTER — Other Ambulatory Visit: Payer: Self-pay | Admitting: Orthopedic Surgery

## 2023-07-09 DIAGNOSIS — M159 Polyosteoarthritis, unspecified: Secondary | ICD-10-CM

## 2023-07-09 MED ORDER — HYDROCODONE-ACETAMINOPHEN 5-325 MG PO TABS: 1.0000 | ORAL_TABLET | Freq: Two times a day (BID) | ORAL | 0 refills | Status: DC

## 2023-07-25 ENCOUNTER — Encounter: Payer: Self-pay | Admitting: Orthopedic Surgery

## 2023-07-25 ENCOUNTER — Non-Acute Institutional Stay: Payer: Self-pay | Admitting: Orthopedic Surgery

## 2023-07-25 DIAGNOSIS — Z0000 Encounter for general adult medical examination without abnormal findings: Secondary | ICD-10-CM | POA: Diagnosis not present

## 2023-07-25 NOTE — Patient Instructions (Signed)
  Yolanda [name] , Thank you for taking time to come for your Medicare Wellness Visit. I appreciate your ongoing commitment to your health goals. Please review the following plan we discussed and let me know if I can assist you in the future.   These are the goals we discussed:  Goals      Maintain Mobility and Function     Evidence-based guidance:  Emphasize the importance of physical activity and aerobic exercise as included in treatment plan; assess barriers to adherence; consider patient's abilities and preferences.  Encourage gradual increase in activity or exercise instead of stopping if pain occurs.  Reinforce individual therapy exercise prescription, such as strengthening, stabilization and stretching programs.  Promote optimal body mechanics to stabilize the spine with lifting and functional activity.  Encourage activity and mobility modifications to facilitate optimal function, such as using a log roll for bed mobility or dressing from a seated position.  Reinforce individual adaptive equipment recommendations to limit excessive spinal movements, such as a Event organiser.  Assess adequacy of sleep; encourage use of sleep hygiene techniques, such as bedtime routine; use of white noise; dark, cool bedroom; avoiding daytime naps, heavy meals or exercise before bedtime.  Promote positions and modification to optimize sleep and sexual activity; consider pillows or positioning devices to assist in maintaining neutral spine.  Explore options for applying ergonomic principles at work and home, such as frequent position changes, using ergonomically designed equipment and working at optimal height.  Promote modifications to increase comfort with driving such as lumbar support, optimizing seat and steering wheel position, using cruise control and taking frequent rest stops to stretch and walk.   Notes:         This is a list of the screening recommended for you and due dates:  Health  Maintenance  Topic Date Due   COVID-19 Vaccine (8 - 2024-25 season) 08/10/2023*   Eye exam for diabetics  10/14/2023   Hemoglobin A1C  10/29/2023   Complete foot exam   04/14/2024   Medicare Annual Wellness Visit  07/24/2024   DTaP/Tdap/Td vaccine (4 - Td or Tdap) 01/05/2027   Pneumonia Vaccine  Completed   Flu Shot  Completed   DEXA scan (bone density measurement)  Completed   HPV Vaccine  Aged Out   Zoster (Shingles) Vaccine  Discontinued  *Topic was postponed. The date shown is not the original due date.

## 2023-07-25 NOTE — Progress Notes (Signed)
 Subjective:   Yolanda [name] is a 89 y.o. female who presents for Medicare Annual (Subsequent) preventive examination.  Visit Complete: In person  Patient Medicare AWV questionnaire was completed by the patient on 07/25/2023; I have confirmed that all information answered by patient is correct and no changes since this date.  Cardiac Risk Factors include: advanced age (>59men, >45 women);diabetes mellitus;hypertension;sedentary lifestyle     Objective:    Today's Vitals   07/25/23 1032  BP: (!) 131/59  Pulse: 67  Resp: 18  Temp: 97.7 F (36.5 C)  SpO2: 98%  Weight: 148 lb (67.1 kg)  Height: 5\' 3"  (1.6 m)   Body mass index is 26.22 kg/m.     05/05/2023    9:45 AM 04/29/2023   10:01 PM 04/15/2023   10:59 AM 02/06/2023   12:27 PM 10/30/2022   10:47 AM 07/18/2022    8:45 AM 04/30/2022   10:21 AM  Advanced Directives  Does Patient Have a Medical Advance Directive? Yes No Yes Yes Yes Yes Yes  Type of Estate agent of [place];Living will;Out of facility DNR (pink MOST or yellow form)  Healthcare Power of Home;Living will;Out of facility DNR (pink MOST or yellow form) Healthcare Power of [place];Out of facility DNR (pink MOST or yellow form);Living will Healthcare Power of [place];Living will;Out of facility DNR (pink MOST or yellow form) Out of facility DNR (pink MOST or yellow form) Healthcare Power of [place];Living will;Out of facility DNR (pink MOST or yellow form)  Does patient want to make changes to medical advance directive? No - Patient declined  No - Patient declined No - Patient declined No - Patient declined No - Patient declined No - Patient declined  Copy of Healthcare Power of Attorney in Chart? Yes - validated most recent copy scanned in chart (See row information)  Yes - validated most recent copy scanned in chart (See row information) Yes - validated most recent copy scanned in chart (See row information) Yes - validated most recent  copy scanned in chart (See row information)  Yes - validated most recent copy scanned in chart (See row information)  Would patient like information on creating a medical advance directive?  No - Patient declined       Pre-existing out of facility DNR order (yellow form or pink MOST form)   Pink MOST/Yellow Form most recent copy in chart - Physician notified to receive inpatient order    Pink MOST/Yellow Form most recent copy in chart - Physician notified to receive inpatient order    Current Medications (verified) Outpatient Encounter Medications as of 07/25/2023  Medication Sig   acetaminophen (TYLENOL) 500 MG tablet Take 500 mg by mouth 2 (two) times daily.    calcium carbonate (TUMS - DOSED IN MG ELEMENTAL CALCIUM) 500 MG chewable tablet Chew 2 tablets by mouth every morning.    cholecalciferol (VITAMIN D3) 25 MCG (1000 UNIT) tablet Take 1,000 Units by mouth daily.   docusate sodium (COLACE) 100 MG capsule Take 100 mg by mouth every morning.   escitalopram (LEXAPRO) 5 MG tablet Take 5 mg by mouth daily.   furosemide (LASIX) 20 MG tablet Take 1 tablet (20 mg total) by mouth daily.   gabapentin (NEURONTIN) 300 MG capsule Take 300 mg by mouth 2 (two) times daily.   HYDROcodone-acetaminophen (NORCO/VICODIN) 5-325 MG tablet Take 1 tablet by mouth 2 (two) times daily. Twice a day 8:00 am,8:00 pm   hydrocortisone (PROCTO-MED HC) 2.5 % rectal cream Place 1 Application  rectally as needed for hemorrhoids or anal itching. for external rectal hemorrhoids. unsupervised self-administration May self administer   Multiple Vitamins-Minerals (PRESERVISION AREDS) CAPS Take 1 capsule by mouth in the morning.   Multiple Vitamins-Minerals (THEREMS M PO) Take 1 tablet by mouth in the morning.   nystatin (MYCOSTATIN/NYSTOP) powder Apply 1 Application topically 2 (two) times daily as needed (irritation in groin area). Apply to groin 2 times daily as needed   Sodium Fluoride 1.1 % PSTE Place 1 Application onto teeth  daily.   zinc oxide 20 % ointment Apply 1 Application topically as needed for irritation (INCONTINENCE AND REDNESS.).   No facility-administered encounter medications on file as of 07/25/2023.    Allergies (verified) Epinephrine, Hydrocodone-acetaminophen, Labetalol, Procaine, and Vitamin d3 [cholecalciferol]   History: Past Medical History:  Diagnosis Date   Acute blood loss anemia    Allergy    Asthma    AVM (arteriovenous malformation) of colon with hemorrhage    Bursitis of right hip    Cataract    CKD (chronic kidney disease), stage III (HCC) 11/04/2015   COLONIC POLYPS 03/26/2005   Qualifier: Diagnosis of  By: Glyn Ade CMA (AAMA), June     DIVERTICULOSIS, COLON 03/26/2005   Qualifier: Diagnosis of  By: Glyn Ade CMA Duncan Dull), June     DVT (deep venous thrombosis), left 11/03/2015   Ecchymosis 12/16/2015   Hyperglycemia 12/16/2015   Hyperlipidemia    Hypertension    Multiple skin tears 12/16/2015   Osteopenia    Stasis edema of both lower extremities 11/04/2015   Unstable gait 12/16/2015   Urine incontinence 12/16/2015   Past Surgical History:  Procedure Laterality Date   ABDOMINAL HYSTERECTOMY  2006   Dr. Delia Heady   CATARACT EXTRACTION W/ INTRAOCULAR LENS IMPLANT Bilateral 1999/2003   Dr. Jethro Bolus   COLONOSCOPY N/A 11/06/2015   Procedure: COLONOSCOPY;  Surgeon: Napoleon Form, MD;  Location: [facility];  Service: Endoscopy;  Laterality: N/A;  MAC if available, otherwise moderate sedation   Family History  Problem Relation Age of Onset   Heart attack Father 45   Social History   Socioeconomic History   Marital status: Widowed    Spouse name: Not on file   Number of children: 2   Years of education: Not on file   Highest education level: Not on file  Occupational History   Occupation: retired Tree surgeon  Tobacco Use   Smoking status: Never   Smokeless tobacco: Never  Vaping Use   Vaping status: Never Used  Substance and Sexual Activity   Alcohol use:  No    Alcohol/week: 0.0 standard drinks of alcohol   Drug use: No   Sexual activity: Never    Comment: was until a few years ago  Other Topics Concern   Not on file  Social History Narrative   Lives alone in an retirement community. Moved [place]/08/15   Walks with a walker.   NOK: 2 sons with shared POA (they live far away) - Onalee Hua Dintinfass would be first call.   Never smoked   Alcohol none   Exercise none   POA, Living Will, MOST             Social Drivers of Health   Financial Resource Strain: Low Risk  (07/25/2023)   Overall Financial Resource Strain (CARDIA)    Difficulty of Paying Living Expenses: Not hard at all  Food Insecurity: No Food Insecurity (07/25/2023)   Hunger Vital Sign    Worried About  Running Out of Food in the Last Year: Never true    Ran Out of Food in the Last Year: Never true  Transportation Needs: No Transportation Needs (07/25/2023)   PRAPARE - Administrator, Civil Service (Medical): No    Lack of Transportation (Non-Medical): No  Physical Activity: Insufficiently Active (07/25/2023)   Exercise Vital Sign    Days of Exercise per Week: 5 days    Minutes of Exercise per Session: 20 min  Stress: No Stress Concern Present (07/25/2023)   [employer] of Occupational Health - Occupational Stress Questionnaire    Feeling of Stress : Not at all  Social Connections: Socially Isolated (07/25/2023)   Social Connection and Isolation Panel [NHANES]    Frequency of Communication with Friends and Family: More than three times a week    Frequency of Social Gatherings with Friends and Family: More than three times a week    Attends Religious Services: Never    Database administrator or Organizations: No    Attends Banker Meetings: Never    Marital Status: Widowed    Tobacco Counseling Counseling given: Not Answered   Clinical Intake:  Pre-visit preparation completed: Yes  Pain : No/denies pain     BMI - recorded:  26.22 Nutritional Status: BMI 25 -29 Overweight Nutritional Risks: None Diabetes: Yes CBG done?: No Did pt. bring in CBG monitor from home?: No  How often do you need to have someone help you when you read instructions, pamphlets, or other written materials from your doctor or pharmacy?: 4 - Often What is the last grade level you completed in school?: college         Activities of Daily Living    07/25/2023   10:37 AM 05/01/2023    7:00 PM  In your present state of health, do you have any difficulty performing the following activities:  Hearing? 0   Vision? 0   Difficulty concentrating or making decisions? 1   Walking or climbing stairs? 1   Dressing or bathing? 1   Doing errands, shopping? 1 1  Preparing Food and eating ? Y   Using the Toilet? Y   In the past six months, have you accidently leaked urine? Y   Do you have problems with loss of bowel control? N   Managing your Medications? Y   Managing your Finances? Y   Housekeeping or managing your Housekeeping? Y     Patient Care Team: Mahlon Gammon, MD as PCP - General (Internal Medicine) Jethro Bolus, MD as Consulting Physician (Ophthalmology) Mahlon Gammon, MD as Consulting Physician (Internal Medicine)  Indicate any recent Medical Services you may have received from other than [facility] providers in the past year (date may be approximate).     Assessment:   This is a routine wellness examination for [facility].  Hearing/Vision screen No results found.   Goals Addressed             This Visit's Progress    Maintain Mobility and Function   On track    Evidence-based guidance:  Emphasize the importance of physical activity and aerobic exercise as included in treatment plan; assess barriers to adherence; consider patient's abilities and preferences.  Encourage gradual increase in activity or exercise instead of stopping if pain occurs.  Reinforce individual therapy exercise prescription, such as strengthening,  stabilization and stretching programs.  Promote optimal body mechanics to stabilize the spine with lifting and functional activity.  Encourage activity and  mobility modifications to facilitate optimal function, such as using a log roll for bed mobility or dressing from a seated position.  Reinforce individual adaptive equipment recommendations to limit excessive spinal movements, such as a Event organiser.  Assess adequacy of sleep; encourage use of sleep hygiene techniques, such as bedtime routine; use of white noise; dark, cool bedroom; avoiding daytime naps, heavy meals or exercise before bedtime.  Promote positions and modification to optimize sleep and sexual activity; consider pillows or positioning devices to assist in maintaining neutral spine.  Explore options for applying ergonomic principles at work and home, such as frequent position changes, using ergonomically designed equipment and working at optimal height.  Promote modifications to increase comfort with driving such as lumbar support, optimizing seat and steering wheel position, using cruise control and taking frequent rest stops to stretch and walk.   Notes:        Depression Screen    07/25/2023   10:47 AM 04/15/2023    4:08 PM 07/15/2022    1:11 PM 07/15/2022    1:10 PM 04/24/2022   11:05 AM 07/02/2021    3:53 PM 01/05/2018   11:59 AM  PHQ 2/9 Scores  PHQ - 2 Score 0 0 0 0 0 0 0    Fall Risk    07/25/2023   10:48 AM 04/15/2023    4:08 PM 10/30/2022    3:14 PM 07/15/2022    1:12 PM 04/24/2022   11:06 AM  Fall Risk   Falls in the past year? 0 0 0 0 0  Number falls in past yr: 0 0 0 0 0  Injury with Fall? 0 0 0 0 0  Risk for fall due to : History of fall(s);Impaired balance/gait History of fall(s);Impaired balance/gait;Impaired mobility Impaired balance/gait;Impaired mobility History of fall(s);Impaired balance/gait;Impaired mobility No Fall Risks  Follow up Falls evaluation completed;Education provided Falls  evaluation completed;Education provided;Falls prevention discussed Falls evaluation completed;Education provided;Falls prevention discussed Falls evaluation completed;Falls prevention discussed;Education provided Falls evaluation completed    MEDICARE RISK AT HOME: Medicare Risk at Home Any stairs in or around the home?: No If so, are there any without handrails?: No Home free of loose throw rugs in walkways, pet beds, electrical cords, etc?: Yes Adequate lighting in your home to reduce risk of falls?: Yes Life alert?: No Use of a cane, walker or w/c?: Yes Grab bars in the bathroom?: Yes Shower chair or bench in shower?: Yes Elevated toilet seat or a handicapped toilet?: Yes  TIMED UP AND GO:  Was the test performed?  No    Cognitive Function:    07/25/2023   10:38 AM 07/15/2022    1:13 PM 07/02/2021    3:55 PM 01/05/2018   11:59 AM 12/30/2016   12:42 PM  MMSE - Mini Mental State Exam  Orientation to time 5 3 5 5 5   Orientation to Place 5 5 5 5 5   Registration 2 2 3 3 3   Attention/ Calculation 5 5 4 5 5   Recall 1 1 3 3 3   Language- name 2 objects 2 2 2 2 2   Language- repeat 1 1 1 1 1   Language- follow 3 step command 3 3 3 3 3   Language- read & follow direction 1 1 1 1 1   Write a sentence 1 1 1 1 1   Copy design 0 1 1 1 1   Total score 26 25 29 30 30         07/02/2021    3:56 PM  6CIT  Screen  What Year? 0 points  What month? 0 points  What time? 3 points  Count back from 20 0 points  Months in reverse 0 points  Repeat phrase 0 points  Total Score 3 points    Immunizations Immunization History  Administered Date(s) Administered   DTaP 05/13/2005   Fluad Quad(high Dose 65+) 03/06/2022, 03/11/2023   Influenza, High Dose Seasonal PF 02/23/2019, 02/15/2020   Influenza-Unspecified 02/11/2015, 02/22/2016, 02/19/2017, 02/16/2018, 03/06/2021   Moderna Covid-19 Vaccine Bivalent Booster 76yrs & up 03/19/2023   Moderna SARS-COV2 Booster Vaccination 10/18/2020   Moderna  Sars-Covid-2 Vaccination 05/17/2019, 06/14/2019, 03/27/2020   PFIZER(Purple Top)SARS-COV-2 Vaccination 01/31/2021, 03/19/2022   PPD Test 12/08/2015, 12/22/2015   Pneumococcal Conjugate-13 05/13/2014   Pneumococcal Polysaccharide-23 05/13/2014, 03/17/2019   RSV,unspecified 05/02/2022   Td 12/11/2005   Tdap 01/04/2017   Zoster Recombinant(Shingrix) 04/15/2022   Zoster, Live 05/13/2014    TDAP status: Up to date  Flu Vaccine status: Up to date  Pneumococcal vaccine status: Up to date  Covid-19 vaccine status: Completed vaccines  Qualifies for Shingles Vaccine? Yes   Zostavax completed Yes   Shingrix Completed?: Yes  Screening Tests Health Maintenance  Topic Date Due   Zoster Vaccines- Shingrix (2 of 2) 06/10/2022   COVID-19 Vaccine (8 - 2024-25 season) 05/14/2023   OPHTHALMOLOGY EXAM  10/14/2023   HEMOGLOBIN A1C  10/29/2023   FOOT EXAM  04/14/2024   Medicare Annual Wellness (AWV)  07/24/2024   DTaP/Tdap/Td (4 - Td or Tdap) 01/05/2027   Pneumonia Vaccine 18+ Years old  Completed   INFLUENZA VACCINE  Completed   DEXA SCAN  Completed   HPV VACCINES  Aged Out    Health Maintenance  Health Maintenance Due  Topic Date Due   Zoster Vaccines- Shingrix (2 of 2) 06/10/2022   COVID-19 Vaccine (8 - 2024-25 season) 05/14/2023    Colorectal cancer screening: No longer required.   Mammogram status: No longer required due to advanced age.  Bone Density status: Completed 2015. Results reflect: Bone density results: OSTEOPENIA. Repeat every not recommended due to goals of care years.  Lung Cancer Screening: (Low Dose CT Chest recommended if Age 59-80 years, 20 pack-year currently smoking OR have quit w/in 15years.) does not qualify.   Lung Cancer Screening Referral: No  Additional Screening:  Hepatitis C Screening: does not qualify; Completed   Vision Screening: Recommended annual ophthalmology exams for early detection of glaucoma and other disorders of the eye. Is the  patient up to date with their annual eye exam?  No  Who is the provider or what is the name of the office in which the patient attends annual eye exams? [facility]> in house provider If pt is not established with a provider, would they like to be referred to a provider to establish care? No .   Dental Screening: Recommended annual dental exams for proper oral hygiene  Diabetic Foot Exam: Diabetic Foot Exam: Completed 07/25/2023  Community Resource Referral / Chronic Care Management: CRR required this visit?  No   CCM required this visit?  No     Plan:     I have personally reviewed and noted the following in the patient's chart:   Medical and social history Use of alcohol, tobacco or illicit drugs  Current medications and supplements including opioid prescriptions. Patient is currently taking opioid prescriptions. Information provided to patient regarding non-opioid alternatives. Patient advised to discuss non-opioid treatment plan with their provider. Functional ability and status Nutritional status Physical activity Advanced directives  List of other physicians Hospitalizations, surgeries, and ER visits in previous 12 months Vitals Screenings to include cognitive, depression, and falls Referrals and appointments  In addition, I have reviewed and discussed with patient certain preventive protocols, quality metrics, and best practice recommendations. A written personalized care plan for preventive services as well as general preventive health recommendations were provided to patient.     Octavia Heir, NP   07/25/2023   After Visit Summary: (MyChart) Due to this being a telephonic visit, the after visit summary with patients personalized plan was offered to patient via MyChart   Nurse Notes: MMSE 26/30. UTD on vaccinations. No future DEXA per goals of care.

## 2023-07-31 ENCOUNTER — Non-Acute Institutional Stay: Admitting: Internal Medicine

## 2023-07-31 DIAGNOSIS — I5032 Chronic diastolic (congestive) heart failure: Secondary | ICD-10-CM | POA: Diagnosis not present

## 2023-07-31 DIAGNOSIS — K625 Hemorrhage of anus and rectum: Secondary | ICD-10-CM

## 2023-07-31 DIAGNOSIS — N1831 Chronic kidney disease, stage 3a: Secondary | ICD-10-CM

## 2023-07-31 DIAGNOSIS — M159 Polyosteoarthritis, unspecified: Secondary | ICD-10-CM | POA: Diagnosis not present

## 2023-07-31 DIAGNOSIS — G3184 Mild cognitive impairment, so stated: Secondary | ICD-10-CM | POA: Diagnosis not present

## 2023-07-31 DIAGNOSIS — E1122 Type 2 diabetes mellitus with diabetic chronic kidney disease: Secondary | ICD-10-CM

## 2023-07-31 DIAGNOSIS — N1832 Chronic kidney disease, stage 3b: Secondary | ICD-10-CM

## 2023-07-31 DIAGNOSIS — G609 Hereditary and idiopathic neuropathy, unspecified: Secondary | ICD-10-CM

## 2023-07-31 DIAGNOSIS — F339 Major depressive disorder, recurrent, unspecified: Secondary | ICD-10-CM

## 2023-08-01 ENCOUNTER — Encounter: Payer: Self-pay | Admitting: Internal Medicine

## 2023-08-01 NOTE — Progress Notes (Signed)
 [facility]  [facility]:  ALF ([id number])  Provider:   Code Status: DNR Goals of Care:     05/05/2023    9:45 AM  Advanced Directives  Does Patient Have a Medical Advance Directive? Yes  Type of Estate agent of [place];Living will;Out of facility DNR (pink MOST or yellow form)  Does patient want to make changes to medical advance directive? No - Patient declined  Copy of Healthcare Power of Attorney in Chart? Yes - validated most recent copy scanned in chart (See row information)     Chief Complaint  Patient presents with   Care Management    HPI: Patient is a 89 y.o. female seen today for medical management of chronic diseases.    Lives in AL in [facility]   Patient has h/o Hypertension, Diabetes Mellitus, H/o CKD,, Low back Pain and Arthritis Mild Cognitive issues Mild Depression Was admitted in Hospital from 12/17-12/20 for Rectal Bleeding  CT abdomen was negative Treated Conservatively and her Bleeding stopped Gi Suspected Bleed due to Colonic AVMS No New episodes since then  She is stable. No new Nursing issues. No Behavior issues Her weight is stable Walks with her walker No Falls  Had No Complains today Very pleasantly confused but able to manage  with AL staff Wt Readings from Last 3 Encounters:  07/31/23 148 lb (67.1 kg)  07/25/23 148 lb (67.1 kg)  05/05/23 150 lb 9.6 oz (68.3 kg)   Past Medical History:  Diagnosis Date   Acute blood loss anemia    Allergy    Asthma    AVM (arteriovenous malformation) of colon with hemorrhage    Bursitis of right hip    Cataract    CKD (chronic kidney disease), stage III (HCC) 11/04/2015   COLONIC POLYPS 03/26/2005   Qualifier: Diagnosis of  By: Glyn Ade CMA (AAMA), June     DIVERTICULOSIS, COLON 03/26/2005   Qualifier: Diagnosis of  By: Glyn Ade CMA Duncan Dull), June     DVT (deep venous thrombosis), left 11/03/2015   Ecchymosis 12/16/2015   Hyperglycemia 12/16/2015   Hyperlipidemia     Hypertension    Multiple skin tears 12/16/2015   Osteopenia    Stasis edema of both lower extremities 11/04/2015   Unstable gait 12/16/2015   Urine incontinence 12/16/2015    Past Surgical History:  Procedure Laterality Date   ABDOMINAL HYSTERECTOMY  2006   Dr. Delia Heady   CATARACT EXTRACTION W/ INTRAOCULAR LENS IMPLANT Bilateral 1999/2003   Dr. Jethro Bolus   COLONOSCOPY N/A 11/06/2015   Procedure: COLONOSCOPY;  Surgeon: Napoleon Form, MD;  Location: [facility];  Service: Endoscopy;  Laterality: N/A;  MAC if available, otherwise moderate sedation    Allergies  Allergen Reactions   Epinephrine Nausea Only    Unknown.   Hydrocodone-Acetaminophen Nausea Only   Labetalol Other (See Comments)    Reaction not listed on MAR    Procaine Other (See Comments)    Reaction not listed on MAR    Vitamin D3 [Cholecalciferol] Other (See Comments)    Allergy not listed on [facility]     Outpatient Encounter Medications as of 07/31/2023  Medication Sig   hydrocortisone (PROCTO-MED HC) 2.5 % rectal cream Place 1 Application rectally as needed for hemorrhoids or anal itching. for external rectal hemorrhoids. unsupervised self-administration May self administer   acetaminophen (TYLENOL) 500 MG tablet Take 500 mg by mouth 2 (two) times daily.    calcium carbonate (TUMS - DOSED  IN MG ELEMENTAL CALCIUM) 500 MG chewable tablet Chew 2 tablets by mouth every morning.    cholecalciferol (VITAMIN D3) 25 MCG (1000 UNIT) tablet Take 1,000 Units by mouth daily.   docusate sodium (COLACE) 100 MG capsule Take 100 mg by mouth every morning.   escitalopram (LEXAPRO) 5 MG tablet Take 5 mg by mouth daily.   furosemide (LASIX) 20 MG tablet Take 1 tablet (20 mg total) by mouth daily.   gabapentin (NEURONTIN) 300 MG capsule Take 300 mg by mouth 2 (two) times daily.   HYDROcodone-acetaminophen (NORCO/VICODIN) 5-325 MG tablet Take 1 tablet by mouth 2 (two) times daily. Twice a day 8:00 am,8:00 pm   Multiple  Vitamins-Minerals (PRESERVISION AREDS) CAPS Take 1 capsule by mouth in the morning.   Multiple Vitamins-Minerals (THEREMS M PO) Take 1 tablet by mouth in the morning.   nystatin (MYCOSTATIN/NYSTOP) powder Apply 1 Application topically 2 (two) times daily as needed (irritation in groin area). Apply to groin 2 times daily as needed   Sodium Fluoride 1.1 % PSTE Place 1 Application onto teeth daily.   zinc oxide 20 % ointment Apply 1 Application topically as needed for irritation (INCONTINENCE AND REDNESS.).   No facility-administered encounter medications on file as of 07/31/2023.    Review of Systems:  Review of Systems  Constitutional:  Negative for activity change and appetite change.  HENT: Negative.    Respiratory:  Negative for cough and shortness of breath.   Cardiovascular:  Positive for leg swelling.  Gastrointestinal:  Negative for constipation.  Genitourinary: Negative.   Musculoskeletal:  Positive for gait problem. Negative for arthralgias and myalgias.  Skin: Negative.   Neurological:  Negative for dizziness and weakness.  Psychiatric/Behavioral:  Positive for confusion. Negative for dysphoric mood and sleep disturbance.     Health Maintenance  Topic Date Due   COVID-19 Vaccine (8 - 2024-25 season) 08/10/2023 (Originally 05/14/2023)   OPHTHALMOLOGY EXAM  10/14/2023   HEMOGLOBIN A1C  10/29/2023   FOOT EXAM  04/14/2024   Medicare Annual Wellness (AWV)  07/24/2024   DTaP/Tdap/Td (4 - Td or Tdap) 01/05/2027   Pneumonia Vaccine 35+ Years old  Completed   INFLUENZA VACCINE  Completed   DEXA SCAN  Completed   HPV VACCINES  Aged Out   Zoster Vaccines- Shingrix  Discontinued    Physical Exam: Vitals:   07/31/23 0917  BP: (!) 126/56  Pulse: (!) 59  Resp: 16  Temp: (!) 97.1 F (36.2 C)  Weight: 148 lb (67.1 kg)   Body mass index is 26.22 kg/m. Physical Exam Vitals reviewed.  Constitutional:      Appearance: Normal appearance.  HENT:     Head: Normocephalic.      Nose: Nose normal.     Mouth/Throat:     Mouth: Mucous membranes are moist.     Pharynx: Oropharynx is clear.  Eyes:     Pupils: Pupils are equal, round, and reactive to light.  Cardiovascular:     Rate and Rhythm: Normal rate and regular rhythm.     Pulses: Normal pulses.     Heart sounds: Normal heart sounds. No murmur heard. Pulmonary:     Effort: Pulmonary effort is normal.     Breath sounds: Normal breath sounds.  Abdominal:     General: Abdomen is flat. Bowel sounds are normal.     Palpations: Abdomen is soft.  Musculoskeletal:        General: Swelling present.     Cervical back: Neck supple.  Skin:  General: Skin is warm.  Neurological:     General: No focal deficit present.     Mental Status: She is alert.  Psychiatric:        Mood and Affect: Mood normal.        Thought Content: Thought content normal.     Labs reviewed: Basic Metabolic Panel: Recent Labs    08/02/22 0000 04/29/23 2230 05/01/23 0436  NA 142 136 140  K 4.0 3.9 4.1  CL 106 101 105  CO2 24* 25 26  GLUCOSE  --  124* 76  BUN 29* 35* 18  CREATININE 1.2* 1.23* 0.90  CALCIUM 8.8 8.9 9.3   Liver Function Tests: Recent Labs    04/29/23 2230  AST 24  ALT 14  ALKPHOS 34*  BILITOT 0.7  PROT 6.4*  ALBUMIN 3.7   Recent Labs    04/29/23 2231  LIPASE 33   No results for input(s): "AMMONIA" in the last 8760 hours. CBC: Recent Labs    04/30/23 1847 05/01/23 1103 05/02/23 0408  WBC 6.5 6.3 6.5  NEUTROABS 4.0  --  3.6  HGB 12.2 11.4* 12.8  HCT 37.7 36.7 39.5  MCV 90.2 92.4 89.6  PLT 173 177 187   Lipid Panel: No results for input(s): "CHOL", "HDL", "LDLCALC", "TRIG", "CHOLHDL", "LDLDIRECT" in the last 8760 hours. Lab Results  Component Value Date   HGBA1C 5.7 (H) 04/30/2023    Procedures since last visit: No results found.  Assessment/Plan 1. Mild cognitive impairment (Primary) 27/30 and Passed her [employer] do not want referral to outside Neurology Do not want her to  be started on Aricept Continue in AL  2. Osteoarthritis involving multiple joints on both sides of body On Low dose of Norco  3. Type 2 diabetes mellitus with stage 3a chronic kidney disease, without long-term current use of insulin (HCC) No Meds Last A1C 5.7  4. Stage 3b chronic kidney disease (HCC) Creat Stable  5. Chronic diastolic congestive heart failure (HCC) Low dose of Lasix  6. Idiopathic peripheral neuropathy Gabapentin  7. Recurrent depression (HCC) Lexapro has helped  8. Rectal bleeding Per GI could be due to Colonic AVMS Conservative management No Recent Episodes    Labs/tests ordered:   Next appt:  Visit date not found

## 2023-08-10 ENCOUNTER — Other Ambulatory Visit: Payer: Self-pay | Admitting: Orthopedic Surgery

## 2023-08-10 DIAGNOSIS — M159 Polyosteoarthritis, unspecified: Secondary | ICD-10-CM

## 2023-08-11 NOTE — Telephone Encounter (Signed)
 Pharmacy Requested refill.  Epic LR: 07/09/2023 Pended and sent to Dr. Chales Abrahams for approval.  [facility] ALF

## 2023-08-26 ENCOUNTER — Other Ambulatory Visit: Payer: Self-pay | Admitting: Adult Health

## 2023-08-26 DIAGNOSIS — M159 Polyosteoarthritis, unspecified: Secondary | ICD-10-CM

## 2023-08-26 MED ORDER — HYDROCODONE-ACETAMINOPHEN 5-325 MG PO TABS: 1.0000 | ORAL_TABLET | Freq: Two times a day (BID) | ORAL | 0 refills | Status: DC

## 2023-09-15 DIAGNOSIS — Z961 Presence of intraocular lens: Secondary | ICD-10-CM | POA: Diagnosis not present

## 2023-09-15 DIAGNOSIS — H524 Presbyopia: Secondary | ICD-10-CM | POA: Diagnosis not present

## 2023-09-15 DIAGNOSIS — H353132 Nonexudative age-related macular degeneration, bilateral, intermediate dry stage: Secondary | ICD-10-CM | POA: Diagnosis not present

## 2023-09-15 DIAGNOSIS — H43813 Vitreous degeneration, bilateral: Secondary | ICD-10-CM | POA: Diagnosis not present

## 2023-09-17 ENCOUNTER — Other Ambulatory Visit: Payer: Self-pay | Admitting: Orthopedic Surgery

## 2023-09-17 DIAGNOSIS — M159 Polyosteoarthritis, unspecified: Secondary | ICD-10-CM

## 2023-09-17 MED ORDER — HYDROCODONE-ACETAMINOPHEN 5-325 MG PO TABS: 1.0000 | ORAL_TABLET | Freq: Two times a day (BID) | ORAL | 0 refills | Status: DC

## 2023-10-07 ENCOUNTER — Other Ambulatory Visit: Payer: Self-pay | Admitting: Adult Health

## 2023-10-07 DIAGNOSIS — M159 Polyosteoarthritis, unspecified: Secondary | ICD-10-CM

## 2023-10-07 MED ORDER — HYDROCODONE-ACETAMINOPHEN 5-325 MG PO TABS: 1.0000 | ORAL_TABLET | Freq: Two times a day (BID) | ORAL | 0 refills | Status: DC

## 2023-10-14 ENCOUNTER — Encounter: Payer: Self-pay | Admitting: Orthopedic Surgery

## 2023-10-14 ENCOUNTER — Non-Acute Institutional Stay: Admitting: Orthopedic Surgery

## 2023-10-14 DIAGNOSIS — R2681 Unsteadiness on feet: Secondary | ICD-10-CM | POA: Diagnosis not present

## 2023-10-14 DIAGNOSIS — G3184 Mild cognitive impairment, so stated: Secondary | ICD-10-CM

## 2023-10-14 DIAGNOSIS — M85852 Other specified disorders of bone density and structure, left thigh: Secondary | ICD-10-CM | POA: Diagnosis not present

## 2023-10-14 DIAGNOSIS — I5032 Chronic diastolic (congestive) heart failure: Secondary | ICD-10-CM

## 2023-10-14 DIAGNOSIS — F424 Excoriation (skin-picking) disorder: Secondary | ICD-10-CM

## 2023-10-14 DIAGNOSIS — E1122 Type 2 diabetes mellitus with diabetic chronic kidney disease: Secondary | ICD-10-CM

## 2023-10-14 DIAGNOSIS — F339 Major depressive disorder, recurrent, unspecified: Secondary | ICD-10-CM

## 2023-10-14 DIAGNOSIS — M159 Polyosteoarthritis, unspecified: Secondary | ICD-10-CM | POA: Diagnosis not present

## 2023-10-14 DIAGNOSIS — D692 Other nonthrombocytopenic purpura: Secondary | ICD-10-CM | POA: Diagnosis not present

## 2023-10-14 DIAGNOSIS — G609 Hereditary and idiopathic neuropathy, unspecified: Secondary | ICD-10-CM

## 2023-10-14 MED ORDER — ESCITALOPRAM OXALATE 10 MG PO TABS
10.0000 mg | ORAL_TABLET | Freq: Every day | ORAL | Status: DC
Start: 2023-10-14 — End: 2023-12-30

## 2023-10-14 NOTE — Progress Notes (Signed)
 Location:  [facility] Room Number: AL23-A Place of Service:  ALF [phone]) Provider:  Rupert Counts, MD  Patient Care Team: Marguerite Shiley, MD as PCP - General (Internal Medicine) Albert Huff, MD as Consulting Physician (Ophthalmology) Marguerite Shiley, MD as Consulting Physician (Internal Medicine)  Extended Emergency Contact Information Primary Emergency Contact: Ruffino,Leonard Address: [address]          [place], [place] [address] [place] Mobile Phone: [phone] Relation: Son Secondary Emergency Contact: Weider,David Home Phone: [phone] Mobile Phone: [phone] Relation: Son  Code Status:  DNR Goals of care: Advanced Directive information    10/14/2023   10:51 AM  Advanced Directives  Does Patient Have a Medical Advance Directive? Yes  Type of Estate agent of [place];Living will;Out of facility DNR (pink MOST or yellow form)  Does patient want to make changes to medical advance directive? No - Patient declined  Copy of Healthcare Power of Attorney in Chart? Yes - validated most recent copy scanned in chart (See row information)  Pre-existing out of facility DNR order (yellow form or pink MOST form) Pink MOST/Yellow Form most recent copy in chart - Physician notified to receive inpatient order     Chief Complaint  Patient presents with   Medical Management of Chronic Issues    Routine    HPI:  Pt is a 89 y.o. female seen today for medical management of chronic diseases.    She currently resides on the assisted living unit at [facility]. PMH: CHF, PVD,  hx of DVT, diverticulosis, T2DM, OA spine, peripheral neuropathy, and CKD III.    Skin picking- increased open lesions to face and forearms Mild cognitive impairment- no behaviors, more forgetful and having to write reminders to self, some delayed responses and recall today, MMSE 26/30 07/25/2023> was 24/30  04/2022 T2DM- A1c 5.8 (12/05)>was 6.0 (10/2022), Diabetic eye exam 09/15/2023> no retinopathy, remains on carb modified diet CHF- Echo LVEF 55-60% 2017, remains on furosemide  daily OA- involves lower back/hips/hands, remains on norco  Unstable gait- ambulates well with walker, no recent falls Osteopenia- DEXA 2015, tscore -2.3, remains on calcium  and vitamin D  Neuropathy- remains on gabapentin  Depression-Na+ 141 05/05/2023, remains on Lexapro   No recent falls or injuries.   Recent weights:  06/01- 152.4 lbs  05/01- 150.4 lbs  04/01- 151.2 lbs  Recent blood pressures:  05/28- 148/58  05/21- 123/52  05/14- 122/60     Past Medical History:  Diagnosis Date   Acute blood loss anemia    Allergy    Asthma    AVM (arteriovenous malformation) of colon with hemorrhage    Bursitis of right hip    Cataract    CKD (chronic kidney disease), stage III (HCC) 11/04/2015   COLONIC POLYPS 03/26/2005   Qualifier: Diagnosis of  By: Brice Campi CMA (AAMA), June     DIVERTICULOSIS, COLON 03/26/2005   Qualifier: Diagnosis of  By: Brice Campi CMA (AAMA), June     DVT (deep venous thrombosis), left 11/03/2015   Ecchymosis 12/16/2015   Hyperglycemia 12/16/2015   Hyperlipidemia    Hypertension    Multiple skin tears 12/16/2015   Osteopenia    Stasis edema of both lower extremities 11/04/2015   Unstable gait 12/16/2015   Urine incontinence 12/16/2015   Past Surgical History:  Procedure Laterality Date   ABDOMINAL HYSTERECTOMY  2006   Dr. Leonetta Rama   CATARACT EXTRACTION W/ INTRAOCULAR LENS IMPLANT Bilateral 1999/2003  Dr. Albert Huff   COLONOSCOPY N/A 11/06/2015   Procedure: COLONOSCOPY;  Surgeon: Sergio Dandy, MD;  Location: [facility];  Service: Endoscopy;  Laterality: N/A;  MAC if available, otherwise moderate sedation    Allergies  Allergen Reactions   Epinephrine Nausea Only    Unknown.   Hydrocodone -Acetaminophen  Nausea Only   Labetalol Other (See Comments)    Reaction not listed on MAR     Procaine Other (See Comments)    Reaction not listed on MAR    Vitamin D3 [Cholecalciferol ] Other (See Comments)    Allergy not listed on [facility]     Outpatient Encounter Medications as of 10/14/2023  Medication Sig   acetaminophen  (TYLENOL ) 500 MG tablet Take 500 mg by mouth 2 (two) times daily.    cholecalciferol  (VITAMIN D3) 25 MCG (1000 UNIT) tablet Take 1,000 Units by mouth daily.   docusate sodium (COLACE) 100 MG capsule Take 100 mg by mouth every morning.   escitalopram  (LEXAPRO ) 5 MG tablet Take 5 mg by mouth daily.   furosemide  (LASIX ) 20 MG tablet Take 1 tablet (20 mg total) by mouth daily.   gabapentin  (NEURONTIN ) 300 MG capsule Take 300 mg by mouth 2 (two) times daily.   HYDROcodone -acetaminophen  (NORCO/VICODIN) 5-325 MG tablet Take 1 tablet by mouth 2 (two) times daily. Twice a day 8:00 am,8:00 pm   hydrocortisone  (PROCTO-MED HC ) 2.5 % rectal cream Place 1 Application rectally as needed for hemorrhoids or anal itching. for external rectal hemorrhoids. unsupervised self-administration May self administer   Multiple Vitamins-Minerals (PRESERVISION AREDS) CAPS Take 1 capsule by mouth in the morning.   Multiple Vitamins-Minerals (THEREMS M PO) Take 1 tablet by mouth in the morning.   nystatin (MYCOSTATIN/NYSTOP) powder Apply 1 Application topically 2 (two) times daily as needed (irritation in groin area). Apply to groin 2 times daily as needed   Sodium Fluoride 1.1 % PSTE Place 1 Application onto teeth daily.   zinc oxide 20 % ointment Apply 1 Application topically as needed for irritation (INCONTINENCE AND REDNESS.).   calcium  carbonate (TUMS - DOSED IN MG ELEMENTAL CALCIUM ) 500 MG chewable tablet Chew 2 tablets by mouth every morning.  (Patient not taking: Reported on 10/14/2023)   No facility-administered encounter medications on file as of 10/14/2023.    Review of Systems  Constitutional:  Negative for fatigue and fever.  HENT:  Negative for sore throat and trouble swallowing.    Eyes:  Negative for visual disturbance.  Respiratory:  Negative for cough, shortness of breath and wheezing.   Cardiovascular:  Negative for chest pain and leg swelling.  Gastrointestinal:  Negative for abdominal distention and abdominal pain.  Genitourinary:  Negative for dysuria and vaginal bleeding.  Musculoskeletal:  Positive for arthralgias and gait problem.  Skin:  Positive for color change and wound.  Neurological:  Positive for weakness. Negative for dizziness and headaches.  Psychiatric/Behavioral:  Positive for confusion and dysphoric mood. Negative for sleep disturbance. The patient is not nervous/anxious.     Immunization History  Administered Date(s) Administered   DTaP 05/13/2005   Fluad Quad(high Dose 65+) 03/06/2022, 03/11/2023   Influenza, High Dose Seasonal PF 02/23/2019, 02/15/2020   Influenza-Unspecified 02/11/2015, 02/22/2016, 02/19/2017, 02/16/2018, 03/06/2021   Moderna Covid-19 Vaccine Bivalent Booster 43yrs & up 03/19/2023   Moderna SARS-COV2 Booster Vaccination 10/18/2020   Moderna Sars-Covid-2 Vaccination 05/17/2019, 06/14/2019, 03/27/2020   PFIZER(Purple Top)SARS-COV-2 Vaccination 01/31/2021, 03/19/2022   PPD Test 12/08/2015, 12/22/2015   Pneumococcal Conjugate-13 05/13/2014   Pneumococcal Polysaccharide-23 05/13/2014, 03/17/2019  RSV,unspecified 05/02/2022   Td 12/11/2005   Tdap 01/04/2017   Zoster Recombinant(Shingrix) 04/15/2022   Zoster, Live 05/13/2014   Pertinent  Health Maintenance Due  Topic Date Due   OPHTHALMOLOGY EXAM  10/14/2023   HEMOGLOBIN A1C  10/29/2023   INFLUENZA VACCINE  12/12/2023   FOOT EXAM  04/14/2024   DEXA SCAN  Completed      04/24/2022   11:06 AM 07/15/2022    1:12 PM 10/30/2022    3:14 PM 04/15/2023    4:08 PM 07/25/2023   10:48 AM  Fall Risk  Falls in the past year? 0 0 0 0 0  Was there an injury with Fall? 0 0 0 0 0  Fall Risk Category Calculator 0 0 0 0 0  Fall Risk Category (Retired) Low      (RETIRED) Patient  Fall Risk Level Low fall risk      Patient at Risk for Falls Due to No Fall Risks History of fall(s);Impaired balance/gait;Impaired mobility Impaired balance/gait;Impaired mobility History of fall(s);Impaired balance/gait;Impaired mobility History of fall(s);Impaired balance/gait  Fall risk Follow up Falls evaluation completed Falls evaluation completed;Falls prevention discussed;Education provided Falls evaluation completed;Education provided;Falls prevention discussed Falls evaluation completed;Education provided;Falls prevention discussed Falls evaluation completed;Education provided   Functional Status Survey:    Vitals:   10/14/23 1047 10/14/23 1048  BP: (!) 148/58 (!) 123/52  Pulse: (!) 57   Resp: 20   Temp: 97.7 F (36.5 C)   SpO2: 98%   Weight: 152 lb 6.4 oz (69.1 kg)   Height: 5\' 3"  (1.6 m)    Body mass index is 27 kg/m. Physical Exam Vitals reviewed.  Constitutional:      General: She is not in acute distress. HENT:     Head: Normocephalic.     Right Ear: Tympanic membrane normal.     Left Ear: Tympanic membrane normal.     Nose: Nose normal.     Mouth/Throat:     Mouth: Mucous membranes are moist.  Eyes:     General:        Right eye: No discharge.        Left eye: No discharge.  Cardiovascular:     Rate and Rhythm: Normal rate and regular rhythm.     Pulses: Normal pulses.     Heart sounds: Normal heart sounds.  Pulmonary:     Effort: Pulmonary effort is normal. No respiratory distress.     Breath sounds: Normal breath sounds. No wheezing or rales.  Abdominal:     General: Bowel sounds are normal. There is no distension.     Palpations: Abdomen is soft.     Tenderness: There is no abdominal tenderness.  Musculoskeletal:     Cervical back: Neck supple.     Right lower leg: Edema present.     Left lower leg: Edema present.     Comments: Non pitting, compression stockings on  Skin:    General: Skin is warm.     Capillary Refill: Capillary refill takes  less than 2 seconds.     Comments: Pea sized open lesions to forehead and left cheek, no sign of infection. Senile purpura present to extremities, vary in size, no skin breakdown.   Neurological:     General: No focal deficit present.     Mental Status: She is alert. Mental status is at baseline.     Motor: Weakness present.     Gait: Gait abnormal.     Comments: Walker, wheelchair  Psychiatric:  Mood and Affect: Mood normal.     Labs reviewed: Recent Labs    04/29/23 2230 05/01/23 0436  NA 136 140  K 3.9 4.1  CL 101 105  CO2 25 26  GLUCOSE 124* 76  BUN 35* 18  CREATININE 1.23* 0.90  CALCIUM  8.9 9.3   Recent Labs    04/29/23 2230  AST 24  ALT 14  ALKPHOS 34*  BILITOT 0.7  PROT 6.4*  ALBUMIN  3.7   Recent Labs    04/30/23 1847 05/01/23 1103 05/02/23 0408  WBC 6.5 6.3 6.5  NEUTROABS 4.0  --  3.6  HGB 12.2 11.4* 12.8  HCT 37.7 36.7 39.5  MCV 90.2 92.4 89.6  PLT 173 177 187   Lab Results  Component Value Date   TSH 2.25 05/03/2021   Lab Results  Component Value Date   HGBA1C 5.7 (H) 04/30/2023   Lab Results  Component Value Date   CHOL 157 12/10/2021   HDL 46 12/10/2021   LDLCALC 90 12/10/2021   TRIG 109 12/10/2021    Significant Diagnostic Results in last 30 days:  No results found.  Assessment/Plan 1. Skin picking habit (Primary) - noted to face and extremities> no infection - will increase Lexapro  due to behaviors - recheck sodium level 06/12 - escitalopram  (LEXAPRO ) 10 MG tablet; Take 1 tablet (10 mg total) by mouth daily.  2. Mild cognitive impairment - no behaviors - able to perform some ADLs like feeding  - sleeping more - writing more reminders for self - not on medication  3. Type 2 diabetes mellitus with chronic kidney disease, without long-term current use of insulin , unspecified CKD stage (HCC) - A1c stable - eye exam 09/15/2023> no retinopathy - diet controlled - do not recommend future A1c if 06/12 value normal  4.  Chronic diastolic congestive heart failure (HCC) - compensated - cont furosemide   5. Osteoarthritis involving multiple joints on both sides of body - stable with norco  6. Unstable gait - no recent falls  7. Osteopenia of neck of left femur - no aggressive workup  - cont calcium  and vitamin D   8. Idiopathic peripheral neuropathy - cont gabapentin   9. Recurrent depression (HCC) - stable mood  - will increase Lexapro  due to skin picking  - escitalopram  (LEXAPRO ) 10 MG tablet; Take 1 tablet (10 mg total) by mouth daily.  10. Senile purpura (HCC) - education given      Family/ staff Communication: plan discussed with patient and nurse  Labs/tests ordered:  cbc/diff, cmp, A1c 06/12

## 2023-10-16 DIAGNOSIS — E119 Type 2 diabetes mellitus without complications: Secondary | ICD-10-CM | POA: Diagnosis not present

## 2023-10-16 DIAGNOSIS — D649 Anemia, unspecified: Secondary | ICD-10-CM | POA: Diagnosis not present

## 2023-10-16 LAB — CBC AND DIFFERENTIAL
HCT: 39 (ref 36–46)
Hemoglobin: 11.8 — AB (ref 12.0–16.0)
Neutrophils Absolute: 3684
Platelets: 192 K/uL (ref 150–400)
WBC: 6.1

## 2023-10-16 LAB — HEPATIC FUNCTION PANEL
ALT: 10 U/L (ref 7–35)
AST: 18 (ref 13–35)
Alkaline Phosphatase: 40 (ref 25–125)
Bilirubin, Total: 0.5

## 2023-10-16 LAB — BASIC METABOLIC PANEL WITH GFR
BUN: 32 — AB (ref 4–21)
CO2: 28 — AB (ref 13–22)
Chloride: 104 (ref 99–108)
Creatinine: 1.3 — AB (ref 0.5–1.1)
Glucose: 88
Potassium: 4.1 meq/L (ref 3.5–5.1)
Sodium: 141 (ref 137–147)

## 2023-10-16 LAB — HEMOGLOBIN A1C: Hemoglobin A1C: 5.9

## 2023-10-16 LAB — COMPREHENSIVE METABOLIC PANEL WITH GFR
Albumin: 3.7 (ref 3.5–5.0)
Calcium: 9 (ref 8.7–10.7)
Globulin: 2.2

## 2023-10-16 LAB — CBC: RBC: 4.28 (ref 3.87–5.11)

## 2023-11-04 ENCOUNTER — Other Ambulatory Visit: Payer: Self-pay | Admitting: Adult Health

## 2023-11-04 DIAGNOSIS — M159 Polyosteoarthritis, unspecified: Secondary | ICD-10-CM

## 2023-11-04 MED ORDER — HYDROCODONE-ACETAMINOPHEN 5-325 MG PO TABS
1.0000 | ORAL_TABLET | Freq: Two times a day (BID) | ORAL | 0 refills | Status: DC
Start: 2023-11-04 — End: 2023-12-09

## 2023-12-09 ENCOUNTER — Other Ambulatory Visit: Payer: Self-pay | Admitting: Adult Health

## 2023-12-09 DIAGNOSIS — M159 Polyosteoarthritis, unspecified: Secondary | ICD-10-CM

## 2023-12-09 MED ORDER — HYDROCODONE-ACETAMINOPHEN 5-325 MG PO TABS: 1.0000 | ORAL_TABLET | Freq: Two times a day (BID) | ORAL | 0 refills | Status: DC

## 2023-12-26 ENCOUNTER — Non-Acute Institutional Stay: Admitting: Orthopedic Surgery

## 2023-12-26 ENCOUNTER — Encounter: Payer: Self-pay | Admitting: Orthopedic Surgery

## 2023-12-26 DIAGNOSIS — F424 Excoriation (skin-picking) disorder: Secondary | ICD-10-CM | POA: Diagnosis not present

## 2023-12-26 DIAGNOSIS — R410 Disorientation, unspecified: Secondary | ICD-10-CM

## 2023-12-26 NOTE — Progress Notes (Signed)
 Location:   [facility]  Nursing Home Room Number: 23-A Place of Service:  ALF [phone]) Provider:  Greig Cluster, NP  PCP: Charlanne Fredia CROME, MD  Patient Care Team: Charlanne Fredia CROME, MD as PCP - General (Internal Medicine) Roz Anes, MD as Consulting Physician (Ophthalmology) Charlanne Fredia CROME, MD as Consulting Physician (Internal Medicine)  Extended Emergency Contact Information Primary Emergency Contact: Osberg,Leonard Address: [address]          [place], [place] [address] [place] Mobile Phone: [phone] Relation: Son Secondary Emergency Contact: Balkcom,David Home Phone: [phone] Mobile Phone: [phone] Relation: Son  Code Status:  DNR Goals of care: Advanced Directive information    12/26/2023   11:11 AM  Advanced Directives  Does Patient Have a Medical Advance Directive? Yes  Type of Estate agent of [place];Living will;Out of facility DNR (pink MOST or yellow form)  Does patient want to make changes to medical advance directive? No - Patient declined  Copy of Healthcare Power of Attorney in Chart? Yes - validated most recent copy scanned in chart (See row information)     Chief Complaint  Patient presents with   Altered Mental Status    Confusion    HPI:  Pt is a 89 y.o. female seen today for acute visit due to confusion.   She currently resides on the assisted living unit at [facility]. PMH: CHF, PVD,  hx of DVT, diverticulosis, T2DM, OA spine, peripheral neuropathy, and CKD III.   Nursing reports increased confusion within past week. Speech repetitive at times. No agitation. She is sleeping through night. H/o cognitive impairment and hearing loss. MMSE 26/30 07/25/2023. She admits to  not feeling well. Afebrile. Vitals stable.    Past Medical History:  Diagnosis Date   Acute blood loss anemia    Allergy    Asthma    AVM (arteriovenous malformation) of colon with hemorrhage     Bursitis of right hip    Cataract    CKD (chronic kidney disease), stage III (HCC) 11/04/2015   COLONIC POLYPS 03/26/2005   Qualifier: Diagnosis of  By: Clarise CMA (AAMA), June     DIVERTICULOSIS, COLON 03/26/2005   Qualifier: Diagnosis of  By: Clarise CMA LEODIS), June     DVT (deep venous thrombosis), left 11/03/2015   Ecchymosis 12/16/2015   Hyperglycemia 12/16/2015   Hyperlipidemia    Hypertension    Multiple skin tears 12/16/2015   Osteopenia    Stasis edema of both lower extremities 11/04/2015   Unstable gait 12/16/2015   Urine incontinence 12/16/2015   Past Surgical History:  Procedure Laterality Date   ABDOMINAL HYSTERECTOMY  2006   Dr. Toney Moats   CATARACT EXTRACTION W/ INTRAOCULAR LENS IMPLANT Bilateral 1999/2003   Dr. Anes Roz   COLONOSCOPY N/A 11/06/2015   Procedure: COLONOSCOPY;  Surgeon: Gustav LULLA Mcgee, MD;  Location: [facility];  Service: Endoscopy;  Laterality: N/A;  MAC if available, otherwise moderate sedation    Allergies  Allergen Reactions   Epinephrine Nausea Only    Unknown.   Hydrocodone -Acetaminophen  Nausea Only   Labetalol Other (See Comments)    Reaction not listed on MAR    Procaine Other (See Comments)    Reaction not listed on MAR    Vitamin D3 [Cholecalciferol ] Other (See Comments)    Allergy not listed on MAR     Allergies as of 12/26/2023       Reactions   Epinephrine Nausea Only   Unknown.  Hydrocodone -acetaminophen  Nausea Only   Labetalol Other (See Comments)   Reaction not listed on MAR    Procaine Other (See Comments)   Reaction not listed on MAR    Vitamin D3 [cholecalciferol ] Other (See Comments)   Allergy not listed on [facility]         Medication List        Accurate as of December 26, 2023 11:12 AM. If you have any questions, ask your nurse or doctor.          acetaminophen  500 MG tablet Commonly known as: TYLENOL  Take 500 mg by mouth 2 (two) times daily.   cholecalciferol  25 MCG (1000 UNIT) tablet Commonly known  as: VITAMIN D3 Take 1,000 Units by mouth daily.   docusate sodium 100 MG capsule Commonly known as: COLACE Take 100 mg by mouth every morning.   escitalopram  10 MG tablet Commonly known as: Lexapro  Take 1 tablet (10 mg total) by mouth daily.   furosemide  20 MG tablet Commonly known as: LASIX  Take 1 tablet (20 mg total) by mouth daily.   gabapentin  300 MG capsule Commonly known as: NEURONTIN  Take 300 mg by mouth 2 (two) times daily.   HYDROcodone -acetaminophen  5-325 MG tablet Commonly known as: NORCO/VICODIN Take 1 tablet by mouth 2 (two) times daily. Twice a day 8:00 am,8:00 pm   nystatin powder Commonly known as: MYCOSTATIN/NYSTOP Apply 1 Application topically 2 (two) times daily as needed (irritation in groin area). Apply to groin 2 times daily as needed   PreserVision AREDS Caps Take 1 capsule by mouth in the morning.   THEREMS M PO Take 1 tablet by mouth in the morning.   Procto-Med HC  2.5 % rectal cream Generic drug: hydrocortisone  Place 1 Application rectally as needed for hemorrhoids or anal itching. for external rectal hemorrhoids. unsupervised self-administration May self administer   Sodium Fluoride 1.1 % Pste Place 1 Application onto teeth daily.   zinc oxide 20 % ointment Apply 1 Application topically as needed for irritation (INCONTINENCE AND REDNESS.).        Review of Systems  Unable to perform ROS: Dementia    Immunization History  Administered Date(s) Administered   DTaP 05/13/2005   Fluad Quad(high Dose 65+) 03/06/2022, 03/11/2023   Influenza, High Dose Seasonal PF 01/02/2015, 02/23/2019, 02/15/2020   Influenza-Unspecified 02/11/2015, 02/22/2016, 02/19/2017, 02/16/2018, 03/06/2021   Moderna Covid-19 Vaccine Bivalent Booster 27yrs & up 03/19/2023   Moderna SARS-COV2 Booster Vaccination 10/18/2020   Moderna Sars-Covid-2 Vaccination 05/17/2019, 06/14/2019, 03/27/2020   PFIZER(Purple Top)SARS-COV-2 Vaccination 01/31/2021, 03/19/2022   PPD  Test 12/08/2015, 12/22/2015   Pneumococcal Conjugate-13 05/13/2014   Pneumococcal Polysaccharide-23 05/13/2014, 03/17/2019   RSV,unspecified 05/02/2022   Td 12/11/2005   Tdap 01/04/2017   Zoster Recombinant(Shingrix) 04/15/2022   Zoster, Live 05/13/2014   Pertinent  Health Maintenance Due  Topic Date Due   OPHTHALMOLOGY EXAM  10/14/2023   INFLUENZA VACCINE  12/12/2023   FOOT EXAM  04/14/2024   HEMOGLOBIN A1C  04/16/2024   DEXA SCAN  Completed      04/24/2022   11:06 AM 07/15/2022    1:12 PM 10/30/2022    3:14 PM 04/15/2023    4:08 PM 07/25/2023   10:48 AM  Fall Risk  Falls in the past year? 0 0 0 0 0  Was there an injury with Fall? 0 0 0 0 0  Fall Risk Category Calculator 0 0 0 0 0  Fall Risk Category (Retired) Low       (RETIRED) Patient Fall Risk Level  Low fall risk       Patient at Risk for Falls Due to No Fall Risks History of fall(s);Impaired balance/gait;Impaired mobility Impaired balance/gait;Impaired mobility History of fall(s);Impaired balance/gait;Impaired mobility History of fall(s);Impaired balance/gait  Fall risk Follow up Falls evaluation completed  Falls evaluation completed;Falls prevention discussed;Education provided Falls evaluation completed;Education provided;Falls prevention discussed Falls evaluation completed;Education provided;Falls prevention discussed Falls evaluation completed;Education provided     Data saved with a previous flowsheet row definition   Functional Status Survey:    Vitals:   12/26/23 1104  BP: 128/77  Pulse: 68  Resp: 19  Temp: 97.9 F (36.6 C)  SpO2: 97%  Weight: 150 lb 12.8 oz (68.4 kg)  Height: 5' 3 (1.6 m)   Body mass index is 26.71 kg/m. Physical Exam Vitals reviewed.  Constitutional:      General: She is not in acute distress. HENT:     Head: Normocephalic.     Right Ear: Tympanic membrane normal.     Left Ear: Tympanic membrane normal.     Ears:     Comments: Bilateral hearing aids    Nose: Nose normal.      Mouth/Throat:     Mouth: Mucous membranes are moist.  Eyes:     General:        Right eye: No discharge.        Left eye: No discharge.  Cardiovascular:     Rate and Rhythm: Normal rate and regular rhythm.     Pulses: Normal pulses.     Heart sounds: Normal heart sounds.  Pulmonary:     Effort: Pulmonary effort is normal.     Breath sounds: Normal breath sounds.  Abdominal:     General: Bowel sounds are normal.  Musculoskeletal:     Cervical back: Neck supple.     Right lower leg: No edema.     Left lower leg: No edema.  Skin:    General: Skin is warm.     Capillary Refill: Capillary refill takes less than 2 seconds.  Neurological:     General: No focal deficit present.     Mental Status: She is alert.     Motor: Weakness present.     Gait: Gait abnormal.  Psychiatric:        Mood and Affect: Mood normal.     Comments: Alert to self/person, follows commands, disoriented/overwhelmed during encounter     Labs reviewed: Recent Labs    04/29/23 2230 05/01/23 0436 10/16/23 0000  NA 136 140 141  K 3.9 4.1 4.1  CL 101 105 104  CO2 25 26 28*  GLUCOSE 124* 76  --   BUN 35* 18 32*  CREATININE 1.23* 0.90 1.3*  CALCIUM  8.9 9.3 9.0   Recent Labs    04/29/23 2230 10/16/23 0000  AST 24 18  ALT 14 10  ALKPHOS 34* 40  BILITOT 0.7  --   PROT 6.4*  --   ALBUMIN  3.7 3.7   Recent Labs    04/30/23 1847 05/01/23 1103 05/02/23 0408 10/16/23 0000  WBC 6.5 6.3 6.5 6.1  NEUTROABS 4.0  --  3.6 3,684.00  HGB 12.2 11.4* 12.8 11.8*  HCT 37.7 36.7 39.5 39  MCV 90.2 92.4 89.6  --   PLT 173 177 187 192   Lab Results  Component Value Date   TSH 2.25 05/03/2021   Lab Results  Component Value Date   HGBA1C 5.9 10/16/2023   Lab Results  Component Value Date   CHOL  157 12/10/2021   HDL 46 12/10/2021   LDLCALC 90 12/10/2021   TRIG 109 12/10/2021    Significant Diagnostic Results in last 30 days:  No results found.  Assessment/Plan 1. Confusion (Primary) - h/o  MCI - MMSE 26/30 07/2023 - appears disorientated/overwhelmed  - ? Worsening cognition vs HOH vs UTI/infection/ electrolyte imbalance - ST for cognitive testing> SLUMS or MOCA - UA/culture> send today - cbc/diff and cmp 08/18 - consider starting Namenda in future  2. Skin picking habit - cont Lexapro    Family/ staff Communication: plan discussed with patient and nurse  Labs/tests ordered:  UA/culture, cbc/diff & cmp 08/18, ST cognitive testing

## 2023-12-30 ENCOUNTER — Non-Acute Institutional Stay: Admitting: Orthopedic Surgery

## 2023-12-30 ENCOUNTER — Encounter: Payer: Self-pay | Admitting: Orthopedic Surgery

## 2023-12-30 DIAGNOSIS — R410 Disorientation, unspecified: Secondary | ICD-10-CM | POA: Diagnosis not present

## 2023-12-30 DIAGNOSIS — I5032 Chronic diastolic (congestive) heart failure: Secondary | ICD-10-CM

## 2023-12-30 DIAGNOSIS — F339 Major depressive disorder, recurrent, unspecified: Secondary | ICD-10-CM

## 2023-12-30 DIAGNOSIS — H906 Mixed conductive and sensorineural hearing loss, bilateral: Secondary | ICD-10-CM

## 2023-12-30 DIAGNOSIS — F424 Excoriation (skin-picking) disorder: Secondary | ICD-10-CM

## 2023-12-30 MED ORDER — ESCITALOPRAM OXALATE 10 MG PO TABS: 5.0000 mg | ORAL_TABLET | Freq: Every day | ORAL | Status: AC

## 2023-12-30 NOTE — Progress Notes (Signed)
 Location:  [facility] Room Number: 23 A Place of Service:  ALF [phone]) Provider:  Greig Cluster, NP   Patient Care Team: Charlanne Fredia CROME, MD as PCP - General (Internal Medicine) Roz Anes, MD as Consulting Physician (Ophthalmology) Charlanne Fredia CROME, MD as Consulting Physician (Internal Medicine)  Extended Emergency Contact Information Primary Emergency Contact: Mezera,Leonard Address: [address]          [place], [place] [address] [place] Mobile Phone: [phone] Relation: Son Secondary Emergency Contact: Reidinger,David Home Phone: [phone] Mobile Phone: [phone] Relation: Son  Code Status:  DNR Goals of care: Advanced Directive information    12/26/2023   11:11 AM  Advanced Directives  Does Patient Have a Medical Advance Directive? Yes  Type of Estate agent of [place];Living will;Out of facility DNR (pink MOST or yellow form)  Does patient want to make changes to medical advance directive? No - Patient declined  Copy of Healthcare Power of Attorney in Chart? Yes - validated most recent copy scanned in chart (See row information)     Chief Complaint  Patient presents with   Acute Visit    Confusion     HPI:  Pt is a 89 y.o. female seen today for acute visit due to ongoing confusion.   She currently resides on the assisted living unit at [facility]. PMH: CHF, PVD,  hx of DVT, diverticulosis, T2DM, OA spine, peripheral neuropathy, and CKD III.    Nursing reports increased confusion within past week. Speech repetitive at times. No agitation. She is sleeping through night. H/o cognitive impairment and hearing loss. MMSE 26/30 07/25/2023. She admits to  not feeling well. Afebrile. Vitals stable.   Recent UA unremarkable, no culture recommended per lab. WBC 6.8, hgb 11.5, hct 36.5, platelets 234, glucose 128, BUN/creat 39/1.70, Na+ 141, K+ 4.4, chloride 105, Co2 28, alk phos 80,  AST/ALT 17/11. She continues to have periods of confusion per nursing. She is forgetting to do ADLs and needing more cueing. ST has been ordered for cognitive testing. She is very hard of hearing even with hearing aids. No behaviors or agitation.   Past Medical History:  Diagnosis Date   Acute blood loss anemia    Allergy    Asthma    AVM (arteriovenous malformation) of colon with hemorrhage    Bursitis of right hip    Cataract    CKD (chronic kidney disease), stage III (HCC) 11/04/2015   COLONIC POLYPS 03/26/2005   Qualifier: Diagnosis of  By: Clarise CMA (AAMA), June     DIVERTICULOSIS, COLON 03/26/2005   Qualifier: Diagnosis of  By: Clarise CMA LEODIS), June     DVT (deep venous thrombosis), left 11/03/2015   Ecchymosis 12/16/2015   Hyperglycemia 12/16/2015   Hyperlipidemia    Hypertension    Multiple skin tears 12/16/2015   Osteopenia    Stasis edema of both lower extremities 11/04/2015   Unstable gait 12/16/2015   Urine incontinence 12/16/2015   Past Surgical History:  Procedure Laterality Date   ABDOMINAL HYSTERECTOMY  2006   Dr. Toney Moats   CATARACT EXTRACTION W/ INTRAOCULAR LENS IMPLANT Bilateral 1999/2003   Dr. Anes Roz   COLONOSCOPY N/A 11/06/2015   Procedure: COLONOSCOPY;  Surgeon: Gustav LULLA Mcgee, MD;  Location: [facility];  Service: Endoscopy;  Laterality: N/A;  MAC if available, otherwise moderate sedation    Allergies  Allergen Reactions   Epinephrine Nausea Only    Unknown.   Hydrocodone -Acetaminophen  Nausea  Only   Labetalol Other (See Comments)    Reaction not listed on MAR    Procaine Other (See Comments)    Reaction not listed on MAR    Vitamin D3 [Cholecalciferol ] Other (See Comments)    Allergy not listed on [facility]     Outpatient Encounter Medications as of 12/30/2023  Medication Sig   acetaminophen  (TYLENOL ) 500 MG tablet Take 500 mg by mouth 2 (two) times daily.    cholecalciferol  (VITAMIN D3) 25 MCG (1000 UNIT) tablet Take 1,000 Units by mouth  daily.   docusate sodium (COLACE) 100 MG capsule Take 100 mg by mouth every morning.   escitalopram  (LEXAPRO ) 10 MG tablet Take 1 tablet (10 mg total) by mouth daily. (Patient taking differently: Take 5 mg by mouth daily. Give 5 mg by mouth one time a day for anxiety;depression)   furosemide  (LASIX ) 20 MG tablet Take 1 tablet (20 mg total) by mouth daily.   gabapentin  (NEURONTIN ) 300 MG capsule Take 300 mg by mouth 2 (two) times daily.   HYDROcodone -acetaminophen  (NORCO/VICODIN) 5-325 MG tablet Take 1 tablet by mouth 2 (two) times daily. Twice a day 8:00 am,8:00 pm   Multiple Vitamins-Minerals (PRESERVISION AREDS) CAPS Take 1 capsule by mouth in the morning.   Multiple Vitamins-Minerals (THEREMS M PO) Take 1 tablet by mouth in the morning.   Sodium Fluoride 1.1 % PSTE Place 1 Application onto teeth daily.   hydrocortisone  (PROCTO-MED HC ) 2.5 % rectal cream Place 1 Application rectally as needed for hemorrhoids or anal itching. for external rectal hemorrhoids. unsupervised self-administration May self administer   nystatin (MYCOSTATIN/NYSTOP) powder Apply 1 Application topically 2 (two) times daily as needed (irritation in groin area). Apply to groin 2 times daily as needed   zinc oxide 20 % ointment Apply 1 Application topically as needed for irritation (INCONTINENCE AND REDNESS.).   No facility-administered encounter medications on file as of 12/30/2023.    Review of Systems  Unable to perform ROS: Dementia    Immunization History  Administered Date(s) Administered   DTaP 05/13/2005   Fluad Quad(high Dose 65+) 03/06/2022, 03/11/2023   Influenza, High Dose Seasonal PF 01/02/2015, 02/23/2019, 02/15/2020   Influenza-Unspecified 02/11/2015, 02/22/2016, 02/19/2017, 02/16/2018, 03/06/2021   Moderna Covid-19 Vaccine Bivalent Booster 62yrs & up 03/19/2023   Moderna SARS-COV2 Booster Vaccination 10/18/2020   Moderna Sars-Covid-2 Vaccination 05/17/2019, 06/14/2019, 03/27/2020   PFIZER(Purple  Top)SARS-COV-2 Vaccination 01/31/2021, 03/19/2022   PPD Test 12/08/2015, 12/22/2015   Pneumococcal Conjugate-13 05/13/2014   Pneumococcal Polysaccharide-23 05/13/2014, 03/17/2019   RSV,unspecified 05/02/2022   Td 12/11/2005   Tdap 01/04/2017   Zoster Recombinant(Shingrix) 04/15/2022   Zoster, Live 05/13/2014   Pertinent  Health Maintenance Due  Topic Date Due   OPHTHALMOLOGY EXAM  10/14/2023   INFLUENZA VACCINE  12/12/2023   FOOT EXAM  04/14/2024   HEMOGLOBIN A1C  04/16/2024   DEXA SCAN  Completed      04/24/2022   11:06 AM 07/15/2022    1:12 PM 10/30/2022    3:14 PM 04/15/2023    4:08 PM 07/25/2023   10:48 AM  Fall Risk  Falls in the past year? 0 0 0 0 0  Was there an injury with Fall? 0 0 0 0 0  Fall Risk Category Calculator 0 0 0 0 0  Fall Risk Category (Retired) Low       (RETIRED) Patient Fall Risk Level Low fall risk       Patient at Risk for Falls Due to No Fall Risks History of fall(s);Impaired  balance/gait;Impaired mobility Impaired balance/gait;Impaired mobility History of fall(s);Impaired balance/gait;Impaired mobility History of fall(s);Impaired balance/gait  Fall risk Follow up Falls evaluation completed  Falls evaluation completed;Falls prevention discussed;Education provided Falls evaluation completed;Education provided;Falls prevention discussed Falls evaluation completed;Education provided;Falls prevention discussed Falls evaluation completed;Education provided     Data saved with a previous flowsheet row definition   Functional Status Survey:    Vitals:   12/30/23 0946  BP: 128/77  Pulse: 68  Resp: 19  Temp: 97.9 F (36.6 C)  SpO2: 97%  Weight: 150 lb 12.8 oz (68.4 kg)  Height: 5' 3 (1.6 m)   Body mass index is 26.71 kg/m. Physical Exam Vitals reviewed.  Constitutional:      General: She is not in acute distress. HENT:     Head: Normocephalic.     Right Ear: There is no impacted cerumen.     Left Ear: There is no impacted cerumen.     Ears:      Comments: HOH with bilateral hearing aids    Nose: Nose normal.     Mouth/Throat:     Mouth: Mucous membranes are moist.  Eyes:     General:        Right eye: No discharge.        Left eye: No discharge.  Cardiovascular:     Rate and Rhythm: Normal rate and regular rhythm.     Pulses: Normal pulses.     Heart sounds: Normal heart sounds.  Pulmonary:     Effort: Pulmonary effort is normal. No respiratory distress.     Breath sounds: Normal breath sounds. No wheezing or rales.  Abdominal:     General: Bowel sounds are normal. There is no distension.     Palpations: Abdomen is soft.     Tenderness: There is no abdominal tenderness.  Musculoskeletal:     Cervical back: Neck supple.     Right lower leg: No edema.     Left lower leg: No edema.  Skin:    General: Skin is warm.     Capillary Refill: Capillary refill takes less than 2 seconds.  Neurological:     General: No focal deficit present.     Mental Status: She is alert.     Gait: Gait abnormal.  Psychiatric:        Mood and Affect: Mood normal.     Comments: Very pleasant, alert to self and place, follows commands     Labs reviewed: Recent Labs    04/29/23 2230 05/01/23 0436 10/16/23 0000  NA 136 140 141  K 3.9 4.1 4.1  CL 101 105 104  CO2 25 26 28*  GLUCOSE 124* 76  --   BUN 35* 18 32*  CREATININE 1.23* 0.90 1.3*  CALCIUM  8.9 9.3 9.0   Recent Labs    04/29/23 2230 10/16/23 0000  AST 24 18  ALT 14 10  ALKPHOS 34* 40  BILITOT 0.7  --   PROT 6.4*  --   ALBUMIN  3.7 3.7   Recent Labs    04/30/23 1847 05/01/23 1103 05/02/23 0408 10/16/23 0000  WBC 6.5 6.3 6.5 6.1  NEUTROABS 4.0  --  3.6 3,684.00  HGB 12.2 11.4* 12.8 11.8*  HCT 37.7 36.7 39.5 39  MCV 90.2 92.4 89.6  --   PLT 173 177 187 192   Lab Results  Component Value Date   TSH 2.25 05/03/2021   Lab Results  Component Value Date   HGBA1C 5.9 10/16/2023   Lab Results  Component Value Date   CHOL 157 12/10/2021   HDL 46 12/10/2021    LDLCALC 90 12/10/2021   TRIG 109 12/10/2021    Significant Diagnostic Results in last 30 days:  No results found.  Assessment/Plan 1. Confusion (Primary) - UA/culture, cbc/diff and cmp unremarkable - MMSE 26/30 07/2023 - needing more cueing to complete ADLs - ST eval for cognitive testing ordered - ? Poor hearing as contributing factor versus dementia   2. Chronic diastolic congestive heart failure (HCC) - compensated  - creat 1.7 12/29/2023 - change furosemide  to QOD  3. Mixed conductive and sensorineural hearing loss of both ears - HOH with hearing aids  - see above - consider scheduling with audiology    Family/ staff Communication: plan discussed with patient and nurse  Labs/tests ordered:  none

## 2024-01-02 ENCOUNTER — Encounter: Payer: Self-pay | Admitting: Orthopedic Surgery

## 2024-01-02 ENCOUNTER — Non-Acute Institutional Stay: Payer: Self-pay | Admitting: Orthopedic Surgery

## 2024-01-02 DIAGNOSIS — F01B0 Vascular dementia, moderate, without behavioral disturbance, psychotic disturbance, mood disturbance, and anxiety: Secondary | ICD-10-CM

## 2024-01-02 DIAGNOSIS — H906 Mixed conductive and sensorineural hearing loss, bilateral: Secondary | ICD-10-CM

## 2024-01-02 NOTE — Progress Notes (Signed)
 Location:  [facility] Room Number: AL23-A Place of Service:  ALF [phone]) Provider:  Gil Greig BRAVO, NP  Charlanne Fredia CROME, MD  Patient Care Team: Charlanne Fredia CROME, MD as PCP - General (Internal Medicine) Roz Anes, MD as Consulting Physician (Ophthalmology) Charlanne Fredia CROME, MD as Consulting Physician (Internal Medicine)  Extended Emergency Contact Information Primary Emergency Contact: Nigh,Leonard Address: [address]          [place], [place] [address] [place] Mobile Phone: [phone] Relation: Son Secondary Emergency Contact: Oxley,David Home Phone: [phone] Mobile Phone: [phone] Relation: Son  Code Status:  DNR Goals of care: Advanced Directive information    12/26/2023   11:11 AM  Advanced Directives  Does Patient Have a Medical Advance Directive? Yes  Type of Estate agent of [place];Living will;Out of facility DNR (pink MOST or yellow form)  Does patient want to make changes to medical advance directive? No - Patient declined  Copy of Healthcare Power of Attorney in Chart? Yes - validated most recent copy scanned in chart (See row information)     Chief Complaint  Patient presents with   Hearing Loss    HPI:  Pt is a 89 y.o. female seen today for acute visit due to hearing loss.   She currently resides on the assisted living unit at [facility]. PMH: CHF, PVD,  hx of DVT, diverticulosis, T2DM, OA spine, peripheral neuropathy, and CKD III.    Nursing reports increased confusion within past week. Speech repetitive at times. No agitation. She is sleeping through night. H/o cognitive impairment and hearing loss. MMSE 26/30 07/25/2023. She admits to  not feeling well. Afebrile. Vitals stable.    Recent UA unremarkable, no culture recommended per lab. WBC 6.8, hgb 11.5, hct 36.5, platelets 234, glucose 128, BUN/creat 39/1.70, Na+ 141, K+ 4.4, chloride 105, Co2 28, alk phos  80, AST/ALT 17/11. She continues to have periods of confusion per nursing. She is forgetting to do ADLs and needing more cueing. ST has been ordered for cognitive testing. She is very hard of hearing even with hearing aids. No behaviors or agitation.  SLUMS done by ST 01/01/2024, score 12/30. She continues to be very hard of hearing with hearing aids. Nursing having to repeat themselves often. She is needing more cueing. No behaviors.    Past Medical History:  Diagnosis Date   Acute blood loss anemia    Allergy    Asthma    AVM (arteriovenous malformation) of colon with hemorrhage    Bursitis of right hip    Cataract    CKD (chronic kidney disease), stage III (HCC) 11/04/2015   COLONIC POLYPS 03/26/2005   Qualifier: Diagnosis of  By: Clarise CMA (AAMA), June     DIVERTICULOSIS, COLON 03/26/2005   Qualifier: Diagnosis of  By: Clarise CMA LEODIS), June     DVT (deep venous thrombosis), left 11/03/2015   Ecchymosis 12/16/2015   Hyperglycemia 12/16/2015   Hyperlipidemia    Hypertension    Multiple skin tears 12/16/2015   Osteopenia    Stasis edema of both lower extremities 11/04/2015   Unstable gait 12/16/2015   Urine incontinence 12/16/2015   Past Surgical History:  Procedure Laterality Date   ABDOMINAL HYSTERECTOMY  2006   Dr. Toney Moats   CATARACT EXTRACTION W/ INTRAOCULAR LENS IMPLANT Bilateral 1999/2003   Dr. Anes Roz   COLONOSCOPY N/A 11/06/2015   Procedure: COLONOSCOPY;  Surgeon: Gustav LULLA Mcgee, MD;  Location: [facility];  Service: Endoscopy;  Laterality: N/A;  MAC if available, otherwise moderate sedation    Allergies  Allergen Reactions   Epinephrine Nausea Only    Unknown.   Hydrocodone -Acetaminophen  Nausea Only   Labetalol Other (See Comments)    Reaction not listed on MAR    Procaine Other (See Comments)    Reaction not listed on MAR    Vitamin D3 [Cholecalciferol ] Other (See Comments)    Allergy not listed on [facility]     Outpatient Encounter Medications as of  01/02/2024  Medication Sig   acetaminophen  (TYLENOL ) 500 MG tablet Take 500 mg by mouth 2 (two) times daily.    cholecalciferol  (VITAMIN D3) 25 MCG (1000 UNIT) tablet Take 1,000 Units by mouth daily.   docusate sodium (COLACE) 100 MG capsule Take 100 mg by mouth every morning.   escitalopram  (LEXAPRO ) 10 MG tablet Take 0.5 tablets (5 mg total) by mouth daily. Give 5 mg by mouth one time a day for anxiety;depression   furosemide  (LASIX ) 20 MG tablet Take 1 tablet (20 mg total) by mouth daily.   gabapentin  (NEURONTIN ) 300 MG capsule Take 300 mg by mouth 2 (two) times daily.   HYDROcodone -acetaminophen  (NORCO/VICODIN) 5-325 MG tablet Take 1 tablet by mouth 2 (two) times daily. Twice a day 8:00 am,8:00 pm   Multiple Vitamins-Minerals (PRESERVISION AREDS) CAPS Take 1 capsule by mouth in the morning.   Multiple Vitamins-Minerals (THEREMS M PO) Take 1 tablet by mouth in the morning.   Sodium Fluoride 1.1 % PSTE Place 1 Application onto teeth daily.   hydrocortisone  (PROCTO-MED HC ) 2.5 % rectal cream Place 1 Application rectally as needed for hemorrhoids or anal itching. for external rectal hemorrhoids. unsupervised self-administration May self administer   nystatin (MYCOSTATIN/NYSTOP) powder Apply 1 Application topically 2 (two) times daily as needed (irritation in groin area). Apply to groin 2 times daily as needed   zinc oxide 20 % ointment Apply 1 Application topically as needed for irritation (INCONTINENCE AND REDNESS.).   No facility-administered encounter medications on file as of 01/02/2024.    Review of Systems  Unable to perform ROS: Dementia    Immunization History  Administered Date(s) Administered   DTaP 05/13/2005   Fluad Quad(high Dose 65+) 03/06/2022, 03/11/2023   Influenza, High Dose Seasonal PF 01/02/2015, 02/23/2019, 02/15/2020   Influenza-Unspecified 02/11/2015, 02/22/2016, 02/19/2017, 02/16/2018, 03/06/2021   Moderna Covid-19 Vaccine Bivalent Booster 58yrs & up 03/19/2023    Moderna SARS-COV2 Booster Vaccination 10/18/2020   Moderna Sars-Covid-2 Vaccination 05/17/2019, 06/14/2019, 03/27/2020   PFIZER(Purple Top)SARS-COV-2 Vaccination 01/31/2021, 03/19/2022   PPD Test 12/08/2015, 12/22/2015   Pneumococcal Conjugate-13 05/13/2014   Pneumococcal Polysaccharide-23 05/13/2014, 03/17/2019   RSV,unspecified 05/02/2022   Td 12/11/2005   Tdap 01/04/2017   Zoster Recombinant(Shingrix) 04/15/2022   Zoster, Live 05/13/2014   Pertinent  Health Maintenance Due  Topic Date Due   OPHTHALMOLOGY EXAM  10/14/2023   INFLUENZA VACCINE  12/12/2023   FOOT EXAM  04/14/2024   HEMOGLOBIN A1C  04/16/2024   DEXA SCAN  Completed      07/15/2022    1:12 PM 10/30/2022    3:14 PM 04/15/2023    4:08 PM 07/25/2023   10:48 AM 12/30/2023    1:17 PM  Fall Risk  Falls in the past year? 0 0 0 0 1  Was there an injury with Fall? 0 0 0 0 0  Fall Risk Category Calculator 0 0 0 0 1  Patient at Risk for Falls Due to History of fall(s);Impaired balance/gait;Impaired mobility Impaired balance/gait;Impaired  mobility History of fall(s);Impaired balance/gait;Impaired mobility History of fall(s);Impaired balance/gait History of fall(s);Impaired balance/gait  Fall risk Follow up Falls evaluation completed;Falls prevention discussed;Education provided Falls evaluation completed;Education provided;Falls prevention discussed Falls evaluation completed;Education provided;Falls prevention discussed Falls evaluation completed;Education provided Falls evaluation completed;Education provided   Functional Status Survey:    Vitals:   01/02/24 1350 01/02/24 1351  BP: (!) 148/68 128/77  Pulse: 64   Temp: 97.7 F (36.5 C)   SpO2: 95%   Weight: 150 lb 12.8 oz (68.4 kg)   Height: 5' 3 (1.6 m)    Body mass index is 26.71 kg/m. Physical Exam Vitals reviewed.  Constitutional:      General: She is not in acute distress. HENT:     Head: Normocephalic.  Eyes:     General:        Right eye: No discharge.         Left eye: No discharge.  Cardiovascular:     Rate and Rhythm: Normal rate and regular rhythm.     Pulses: Normal pulses.     Heart sounds: Normal heart sounds.  Pulmonary:     Effort: Pulmonary effort is normal.     Breath sounds: Normal breath sounds.  Abdominal:     General: Bowel sounds are normal. There is no distension.     Palpations: Abdomen is soft.     Tenderness: There is no abdominal tenderness.  Musculoskeletal:     Cervical back: Neck supple.     Right lower leg: No edema.     Left lower leg: No edema.  Skin:    General: Skin is warm.     Capillary Refill: Capillary refill takes less than 2 seconds.  Neurological:     General: No focal deficit present.     Mental Status: She is alert.     Gait: Gait abnormal.  Psychiatric:     Comments: Mild aphasia, repetitive phrases, pleasant, follows commands     Labs reviewed: Recent Labs    04/29/23 2230 05/01/23 0436 05/05/23 0000 10/16/23 0000  NA 136 140 141 141  K 3.9 4.1 4.0 4.1  CL 101 105 106 104  CO2 25 26 30* 28*  GLUCOSE 124* 76  --   --   BUN 35* 18 30* 32*  CREATININE 1.23* 0.90 1.2* 1.3*  CALCIUM  8.9 9.3 8.9 9.0   Recent Labs    04/29/23 2230 10/16/23 0000  AST 24 18  ALT 14 10  ALKPHOS 34* 40  BILITOT 0.7  --   PROT 6.4*  --   ALBUMIN  3.7 3.7   Recent Labs    04/30/23 1847 05/01/23 1103 05/02/23 0408 05/05/23 0000 10/16/23 0000  WBC 6.5 6.3 6.5 5.5 6.1  NEUTROABS 4.0  --  3.6  --  3,684.00  HGB 12.2 11.4* 12.8 11.9* 11.8*  HCT 37.7 36.7 39.5 37 39  MCV 90.2 92.4 89.6  --   --   PLT 173 177 187 199 192   Lab Results  Component Value Date   TSH 2.25 05/03/2021   Lab Results  Component Value Date   HGBA1C 5.9 10/16/2023   Lab Results  Component Value Date   CHOL 157 12/10/2021   HDL 46 12/10/2021   LDLCALC 90 12/10/2021   TRIG 109 12/10/2021    Significant Diagnostic Results in last 30 days:  No results found.  Assessment/Plan 1. 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  Contributing to increased confusion - schedule with in house AIM clinic on 08/27  2. 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

## 2024-01-06 DIAGNOSIS — L602 Onychogryphosis: Secondary | ICD-10-CM | POA: Diagnosis not present

## 2024-01-06 DIAGNOSIS — M79672 Pain in left foot: Secondary | ICD-10-CM | POA: Diagnosis not present

## 2024-01-06 DIAGNOSIS — M79671 Pain in right foot: Secondary | ICD-10-CM | POA: Diagnosis not present

## 2024-01-07 ENCOUNTER — Other Ambulatory Visit: Payer: Self-pay | Admitting: Orthopedic Surgery

## 2024-01-07 DIAGNOSIS — M159 Polyosteoarthritis, unspecified: Secondary | ICD-10-CM

## 2024-01-07 MED ORDER — HYDROCODONE-ACETAMINOPHEN 5-325 MG PO TABS: 1.0000 | ORAL_TABLET | Freq: Two times a day (BID) | ORAL | 0 refills | Status: DC

## 2024-01-07 MED ORDER — HYDROCODONE-ACETAMINOPHEN 5-325 MG PO TABS
1.0000 | ORAL_TABLET | Freq: Two times a day (BID) | ORAL | 0 refills | Status: DC
Start: 2024-01-07 — End: 2024-02-03

## 2024-01-08 LAB — BASIC METABOLIC PANEL WITH GFR: Potassium: 4.2 meq/L (ref 3.5–5.1)

## 2024-01-19 LAB — BASIC METABOLIC PANEL WITH GFR
BUN: 20 (ref 4–21)
CO2: 32 — AB (ref 13–22)
Chloride: 97 — AB (ref 99–108)
Creatinine: 0.9 (ref 0.5–1.1)
Glucose: 140
Potassium: 3.6 meq/L (ref 3.5–5.1)
Sodium: 136 — AB (ref 137–147)

## 2024-01-19 LAB — COMPREHENSIVE METABOLIC PANEL WITH GFR
Calcium: 8.4 — AB (ref 8.7–10.7)
eGFR: 59

## 2024-01-29 ENCOUNTER — Non-Acute Institutional Stay: Admitting: Internal Medicine

## 2024-01-29 ENCOUNTER — Encounter: Payer: Self-pay | Admitting: Internal Medicine

## 2024-01-29 DIAGNOSIS — E1122 Type 2 diabetes mellitus with diabetic chronic kidney disease: Secondary | ICD-10-CM

## 2024-01-29 DIAGNOSIS — N1832 Chronic kidney disease, stage 3b: Secondary | ICD-10-CM

## 2024-01-29 DIAGNOSIS — G3184 Mild cognitive impairment, so stated: Secondary | ICD-10-CM

## 2024-01-29 DIAGNOSIS — F339 Major depressive disorder, recurrent, unspecified: Secondary | ICD-10-CM

## 2024-01-29 DIAGNOSIS — R600 Localized edema: Secondary | ICD-10-CM

## 2024-01-29 DIAGNOSIS — M159 Polyosteoarthritis, unspecified: Secondary | ICD-10-CM | POA: Diagnosis not present

## 2024-01-29 NOTE — Progress Notes (Unsigned)
 Location:  [facility] Room Number: AL 23-A Place of Service:  ALF [phone]) Provider:  Charlanne Fredia CROME, MD  Patient Care Team: Charlanne Fredia CROME, MD as PCP - General (Internal Medicine) Roz Anes, MD as Consulting Physician (Ophthalmology) Charlanne Fredia CROME, MD as Consulting Physician (Internal Medicine)  Extended Emergency Contact Information Primary Emergency Contact: Clendenning,Leonard Address: [address]          [place], [place] [address] [place] Phone: [phone] Relation: Son Secondary Emergency Contact: Maione,David Home Phone: [phone] Mobile Phone: [phone] Relation: Son  Code Status:  DNR Goals of care: Advanced Directive information    12/26/2023   11:11 AM  Advanced Directives  Does Patient Have a Medical Advance Directive? Yes  Type of Estate agent of [place];Living will;Out of facility DNR (pink MOST or yellow form)  Does patient want to make changes to medical advance directive? No - Patient declined  Copy of Healthcare Power of Attorney in Chart? Yes - validated most recent copy scanned in chart (See row information)     Chief Complaint  Patient presents with   Routine Visit    HPI:  Pt is a 89 y.o. female seen today for medical management of chronic diseases.    Lives in [place] in [facility]   Patient has h/o Hypertension, Diabetes Mellitus, H/o CKD,, Low back Pain and Arthritis Mild Cognitive issues Mild Depression  Was admitted in Hospital from 12/17-12/20 for Rectal Bleeding  CT abdomen was negative Treated Conservatively and her Bleeding stopped Gi Suspected Bleed due to Colonic AVMS No New episodes since then     She is stable. No new Nursing issues. No Behavior issues Gets Confused but able to manage in [place] Her weight is stable Walks with her walker No Falls Wt Readings from Last 3 Encounters:  01/29/24 151 lb 12.8 oz (68.9 kg)  01/02/24 150 lb 12.8 oz (68.4 kg)   12/30/23 150 lb 12.8 oz (68.4 kg)   Past Medical History:  Diagnosis Date   Acute blood loss anemia    Allergy    Asthma    AVM (arteriovenous malformation) of colon with hemorrhage    Bursitis of right hip    Cataract    CKD (chronic kidney disease), stage III (HCC) 11/04/2015   COLONIC POLYPS 03/26/2005   Qualifier: Diagnosis of  By: Clarise CMA (AAMA), June     DIVERTICULOSIS, COLON 03/26/2005   Qualifier: Diagnosis of  By: Clarise CMA LEODIS), June     DVT (deep venous thrombosis), left 11/03/2015   Ecchymosis 12/16/2015   Hyperglycemia 12/16/2015   Hyperlipidemia    Hypertension    Multiple skin tears 12/16/2015   Osteopenia    Stasis edema of both lower extremities 11/04/2015   Unstable gait 12/16/2015   Urine incontinence 12/16/2015   Past Surgical History:  Procedure Laterality Date   ABDOMINAL HYSTERECTOMY  2006   Dr. Toney Moats   CATARACT EXTRACTION W/ INTRAOCULAR LENS IMPLANT Bilateral 1999/2003   Dr. Anes Roz   COLONOSCOPY N/A 11/06/2015   Procedure: COLONOSCOPY;  Surgeon: Gustav LULLA Mcgee, MD;  Location: [facility];  Service: Endoscopy;  Laterality: N/A;  MAC if available, otherwise moderate sedation    Allergies  Allergen Reactions   Epinephrine Nausea Only    Unknown.   Hydrocodone -Acetaminophen  Nausea Only   Labetalol Other (See Comments)    Reaction not listed on MAR    Procaine Other (See Comments)    Reaction not listed  on MAR    Vitamin D3 [Cholecalciferol ] Other (See Comments)    Allergy not listed on [facility]     Outpatient Encounter Medications as of 01/29/2024  Medication Sig   acetaminophen  (TYLENOL ) 500 MG tablet Take 500 mg by mouth 2 (two) times daily.    cholecalciferol  (VITAMIN D3) 25 MCG (1000 UNIT) tablet Take 1,000 Units by mouth daily.   docusate sodium (COLACE) 100 MG capsule Take 100 mg by mouth every morning.   escitalopram  (LEXAPRO ) 10 MG tablet Take 0.5 tablets (5 mg total) by mouth daily. Give 5 mg by mouth one time a day for  anxiety;depression   furosemide  (LASIX ) 20 MG tablet Take 1 tablet (20 mg total) by mouth daily.   gabapentin  (NEURONTIN ) 300 MG capsule Take 300 mg by mouth 2 (two) times daily.   HYDROcodone -acetaminophen  (NORCO/VICODIN) 5-325 MG tablet Take 1 tablet by mouth 2 (two) times daily. Twice a day 8:00 am,8:00 pmTake 1 tablet by mouth 2 (two) times daily. Twice a day 8:00 am,8:00 pm   hydrocortisone  (PROCTO-MED HC ) 2.5 % rectal cream Place 1 Application rectally as needed for hemorrhoids or anal itching. for external rectal hemorrhoids. unsupervised self-administration May self administer   Multiple Vitamins-Minerals (PRESERVISION AREDS) CAPS Take 1 capsule by mouth in the morning.   Multiple Vitamins-Minerals (THEREMS M PO) Take 1 tablet by mouth in the morning.   nystatin (MYCOSTATIN/NYSTOP) powder Apply 1 Application topically 2 (two) times daily as needed (irritation in groin area). Apply to groin 2 times daily as needed   Sodium Fluoride 1.1 % PSTE Place 1 Application onto teeth daily.   zinc oxide 20 % ointment Apply 1 Application topically as needed for irritation (INCONTINENCE AND REDNESS.).   [START ON 02/07/2024] HYDROcodone -acetaminophen  (NORCO/VICODIN) 5-325 MG tablet Take 1 tablet by mouth 2 (two) times daily. Twice a day 8:00 am,8:00 pmTake 1 tablet by mouth 2 (two) times daily. Twice a day 8:00 am,8:00 pm (Patient not taking: Reported on 01/29/2024)   No facility-administered encounter medications on file as of 01/29/2024.    Review of Systems  Constitutional:  Negative for activity change and appetite change.  HENT: Negative.    Respiratory:  Negative for cough and shortness of breath.   Cardiovascular:  Negative for leg swelling.  Gastrointestinal:  Negative for constipation.  Genitourinary: Negative.   Musculoskeletal:  Positive for gait problem. Negative for arthralgias and myalgias.  Skin: Negative.   Neurological:  Negative for dizziness and weakness.  Psychiatric/Behavioral:   Positive for confusion. Negative for dysphoric mood and sleep disturbance.     Immunization History  Administered Date(s) Administered   DTaP 05/13/2005   Fluad Quad(high Dose 65+) 03/06/2022, 03/11/2023   INFLUENZA, HIGH DOSE SEASONAL PF 01/02/2015, 02/23/2019, 02/15/2020   Influenza-Unspecified 02/11/2015, 02/22/2016, 02/19/2017, 02/16/2018, 03/06/2021   Moderna Covid-19 Vaccine Bivalent Booster 32yrs & up 03/19/2023   Moderna SARS-COV2 Booster Vaccination 10/18/2020   Moderna Sars-Covid-2 Vaccination 05/17/2019, 06/14/2019, 03/27/2020   PFIZER(Purple Top)SARS-COV-2 Vaccination 01/31/2021, 03/19/2022   PPD Test 12/08/2015, 12/22/2015   Pneumococcal Conjugate-13 05/13/2014   Pneumococcal Polysaccharide-23 05/13/2014, 03/17/2019   RSV,unspecified 05/02/2022   Td 12/11/2005   Tdap 01/04/2017   Unspecified SARS-COV-2 Vaccination 09/03/2023   Zoster Recombinant(Shingrix) 04/15/2022   Zoster, Live 05/13/2014   Pertinent  Health Maintenance Due  Topic Date Due   OPHTHALMOLOGY EXAM  10/14/2023   Influenza Vaccine  12/12/2023   FOOT EXAM  04/14/2024   HEMOGLOBIN A1C  04/16/2024   DEXA SCAN  Completed  07/15/2022    1:12 PM 10/30/2022    3:14 PM 04/15/2023    4:08 PM 07/25/2023   10:48 AM 12/30/2023    1:17 PM  Fall Risk  Falls in the past year? 0 0 0 0 1  Was there an injury with Fall? 0 0 0 0 0  Fall Risk Category Calculator 0 0 0 0 1  Patient at Risk for Falls Due to History of fall(s);Impaired balance/gait;Impaired mobility Impaired balance/gait;Impaired mobility History of fall(s);Impaired balance/gait;Impaired mobility History of fall(s);Impaired balance/gait History of fall(s);Impaired balance/gait  Fall risk Follow up Falls evaluation completed;Falls prevention discussed;Education provided Falls evaluation completed;Education provided;Falls prevention discussed Falls evaluation completed;Education provided;Falls prevention discussed Falls evaluation completed;Education  provided Falls evaluation completed;Education provided   Functional Status Survey:    Vitals:   01/29/24 1336  BP: (!) 126/57  Pulse: 70  Resp: 20  Temp: 98.1 F (36.7 C)  SpO2: 95%  Weight: 151 lb 12.8 oz (68.9 kg)  Height: 5' 3 (1.6 m)   Body mass index is 26.89 kg/m. Physical Exam Vitals reviewed.  Constitutional:      Appearance: Normal appearance.  HENT:     Head: Normocephalic.     Nose: Nose normal.     Mouth/Throat:     Mouth: Mucous membranes are moist.     Pharynx: Oropharynx is clear.  Eyes:     Pupils: Pupils are equal, round, and reactive to light.  Cardiovascular:     Rate and Rhythm: Normal rate and regular rhythm.     Pulses: Normal pulses.     Heart sounds: Normal heart sounds. No murmur heard. Pulmonary:     Effort: Pulmonary effort is normal.     Breath sounds: Normal breath sounds.  Abdominal:     General: Abdomen is flat. Bowel sounds are normal.     Palpations: Abdomen is soft.  Musculoskeletal:        General: Swelling present.     Cervical back: Neck supple.  Skin:    General: Skin is warm.  Neurological:     General: No focal deficit present.     Mental Status: She is alert and oriented to person, place, and time.  Psychiatric:        Mood and Affect: Mood normal.        Thought Content: Thought content normal.     Labs reviewed: Recent Labs    04/29/23 2230 05/01/23 0436 05/05/23 0000 10/16/23 0000 01/08/24 0000 01/19/24 0000  NA 136 140 141 141  --  136*  K 3.9 4.1 4.0 4.1 4.2 3.6  CL 101 105 106 104  --  97*  CO2 25 26 30* 28*  --  32*  GLUCOSE 124* 76  --   --   --   --   BUN 35* 18 30* 32*  --  20  CREATININE 1.23* 0.90 1.2* 1.3*  --  0.9  CALCIUM  8.9 9.3 8.9 9.0  --  8.4*   Recent Labs    04/29/23 2230 10/16/23 0000  AST 24 18  ALT 14 10  ALKPHOS 34* 40  BILITOT 0.7  --   PROT 6.4*  --   ALBUMIN  3.7 3.7   Recent Labs    04/30/23 1847 05/01/23 1103 05/02/23 0408 05/05/23 0000 10/16/23 0000  WBC  6.5 6.3 6.5 5.5 6.1  NEUTROABS 4.0  --  3.6  --  3,684.00  HGB 12.2 11.4* 12.8 11.9* 11.8*  HCT 37.7 36.7 39.5 37 39  MCV  90.2 92.4 89.6  --   --   PLT 173 177 187 199 192   Lab Results  Component Value Date   TSH 2.25 05/03/2021   Lab Results  Component Value Date   HGBA1C 5.9 10/16/2023   Lab Results  Component Value Date   CHOL 157 12/10/2021   HDL 46 12/10/2021   LDLCALC 90 12/10/2021   TRIG 109 12/10/2021    Significant Diagnostic Results in last 30 days:  No results found.  Assessment/Plan 1. Moderate cognitive impairment (Primary)  POA do not want referral to outside Neurology Do not want her to be started on Aricept Continue in AL Her Latest SLUM was 12/30 Progressive cognition impairment 2. Osteoarthritis involving multiple joints on both sides of body On [place]  3. Type 2 diabetes mellitus with chronic kidney disease, without long-term current use of insulin , unspecified CKD stage (HCC) No Meds  4. Stage 3b chronic kidney disease (HCC) Crest stable Her Lasix  was changed to every other day as creat was slightly high  5. Recurrent depression (HCC) Low dose of Lexapro     Family/ staff Communication:   Labs/tests ordered:

## 2024-01-30 MED ORDER — FUROSEMIDE 20 MG PO TABS: 20.0000 mg | ORAL_TABLET | ORAL | Status: AC

## 2024-02-03 ENCOUNTER — Other Ambulatory Visit: Payer: Self-pay | Admitting: Adult Health

## 2024-02-03 DIAGNOSIS — M159 Polyosteoarthritis, unspecified: Secondary | ICD-10-CM

## 2024-02-03 MED ORDER — HYDROCODONE-ACETAMINOPHEN 5-325 MG PO TABS: 1.0000 | ORAL_TABLET | Freq: Two times a day (BID) | ORAL | 0 refills | Status: DC

## 2024-02-04 ENCOUNTER — Other Ambulatory Visit: Payer: Self-pay | Admitting: Orthopedic Surgery

## 2024-02-04 DIAGNOSIS — M159 Polyosteoarthritis, unspecified: Secondary | ICD-10-CM

## 2024-02-04 MED ORDER — HYDROCODONE-ACETAMINOPHEN 5-325 MG PO TABS: 1.0000 | ORAL_TABLET | Freq: Two times a day (BID) | ORAL | 0 refills | Status: DC

## 2024-02-12 DIAGNOSIS — M6281 Muscle weakness (generalized): Secondary | ICD-10-CM | POA: Diagnosis not present

## 2024-02-12 DIAGNOSIS — Z9181 History of falling: Secondary | ICD-10-CM | POA: Diagnosis not present

## 2024-02-12 DIAGNOSIS — R2681 Unsteadiness on feet: Secondary | ICD-10-CM | POA: Diagnosis not present

## 2024-02-16 DIAGNOSIS — Z9181 History of falling: Secondary | ICD-10-CM | POA: Diagnosis not present

## 2024-02-16 DIAGNOSIS — R2681 Unsteadiness on feet: Secondary | ICD-10-CM | POA: Diagnosis not present

## 2024-02-16 DIAGNOSIS — M6281 Muscle weakness (generalized): Secondary | ICD-10-CM | POA: Diagnosis not present

## 2024-02-18 DIAGNOSIS — Z9181 History of falling: Secondary | ICD-10-CM | POA: Diagnosis not present

## 2024-02-18 DIAGNOSIS — M6281 Muscle weakness (generalized): Secondary | ICD-10-CM | POA: Diagnosis not present

## 2024-02-18 DIAGNOSIS — R2681 Unsteadiness on feet: Secondary | ICD-10-CM | POA: Diagnosis not present

## 2024-02-23 DIAGNOSIS — R2681 Unsteadiness on feet: Secondary | ICD-10-CM | POA: Diagnosis not present

## 2024-02-23 DIAGNOSIS — M6281 Muscle weakness (generalized): Secondary | ICD-10-CM | POA: Diagnosis not present

## 2024-02-23 DIAGNOSIS — Z9181 History of falling: Secondary | ICD-10-CM | POA: Diagnosis not present

## 2024-02-26 DIAGNOSIS — Z9181 History of falling: Secondary | ICD-10-CM | POA: Diagnosis not present

## 2024-02-26 DIAGNOSIS — R2681 Unsteadiness on feet: Secondary | ICD-10-CM | POA: Diagnosis not present

## 2024-02-26 DIAGNOSIS — M6281 Muscle weakness (generalized): Secondary | ICD-10-CM | POA: Diagnosis not present

## 2024-03-01 DIAGNOSIS — R2681 Unsteadiness on feet: Secondary | ICD-10-CM | POA: Diagnosis not present

## 2024-03-01 DIAGNOSIS — M6281 Muscle weakness (generalized): Secondary | ICD-10-CM | POA: Diagnosis not present

## 2024-03-01 DIAGNOSIS — Z9181 History of falling: Secondary | ICD-10-CM | POA: Diagnosis not present

## 2024-03-04 DIAGNOSIS — Z9181 History of falling: Secondary | ICD-10-CM | POA: Diagnosis not present

## 2024-03-04 DIAGNOSIS — R2681 Unsteadiness on feet: Secondary | ICD-10-CM | POA: Diagnosis not present

## 2024-03-04 DIAGNOSIS — M6281 Muscle weakness (generalized): Secondary | ICD-10-CM | POA: Diagnosis not present

## 2024-03-08 DIAGNOSIS — H353132 Nonexudative age-related macular degeneration, bilateral, intermediate dry stage: Secondary | ICD-10-CM | POA: Diagnosis not present

## 2024-03-08 DIAGNOSIS — H0100A Unspecified blepharitis right eye, upper and lower eyelids: Secondary | ICD-10-CM | POA: Diagnosis not present

## 2024-03-08 DIAGNOSIS — H0100B Unspecified blepharitis left eye, upper and lower eyelids: Secondary | ICD-10-CM | POA: Diagnosis not present

## 2024-04-06 ENCOUNTER — Other Ambulatory Visit: Payer: Self-pay | Admitting: Nurse Practitioner

## 2024-04-06 DIAGNOSIS — M159 Polyosteoarthritis, unspecified: Secondary | ICD-10-CM

## 2024-04-06 MED ORDER — HYDROCODONE-ACETAMINOPHEN 5-325 MG PO TABS: 1.0000 | ORAL_TABLET | Freq: Two times a day (BID) | ORAL | 0 refills | Status: DC

## 2024-05-04 ENCOUNTER — Other Ambulatory Visit: Payer: Self-pay | Admitting: Nurse Practitioner

## 2024-05-04 DIAGNOSIS — M159 Polyosteoarthritis, unspecified: Secondary | ICD-10-CM

## 2024-05-04 MED ORDER — HYDROCODONE-ACETAMINOPHEN 5-325 MG PO TABS: 1.0000 | ORAL_TABLET | Freq: Two times a day (BID) | ORAL | 0 refills | Status: DC

## 2024-05-27 ENCOUNTER — Non-Acute Institutional Stay: Payer: Self-pay | Admitting: Internal Medicine

## 2024-05-27 DIAGNOSIS — R600 Localized edema: Secondary | ICD-10-CM | POA: Diagnosis not present

## 2024-05-27 DIAGNOSIS — G3184 Mild cognitive impairment, so stated: Secondary | ICD-10-CM

## 2024-05-27 DIAGNOSIS — E1122 Type 2 diabetes mellitus with diabetic chronic kidney disease: Secondary | ICD-10-CM | POA: Diagnosis not present

## 2024-05-27 DIAGNOSIS — F339 Major depressive disorder, recurrent, unspecified: Secondary | ICD-10-CM | POA: Diagnosis not present

## 2024-05-27 DIAGNOSIS — N1832 Chronic kidney disease, stage 3b: Secondary | ICD-10-CM | POA: Diagnosis not present

## 2024-05-27 NOTE — Progress Notes (Signed)
 "  Location:  [facility] of Service:  [facility] ([id number])  Provider:   Code Status: DNR Goals of Care:     12/26/2023   11:11 AM  Advanced Directives  Does Patient Have a Medical Advance Directive? Yes  Type of Estate Agent of [place];Living will;Out of facility DNR (pink MOST or yellow form)  Does patient want to make changes to medical advance directive? No - Patient declined  Copy of Healthcare Power of Attorney in Chart? Yes - validated most recent copy scanned in chart (See row information)     Chief Complaint  Patient presents with   Care Management    HPI: Patient is a 89 y.o. female seen today for medical management of chronic diseases.    L[facility] in [place] in [facility]   Patient has h/o Hypertension, Diabetes Mellitus, H/o CKD,, Low back Pain and Arthritis Mild Cognitive issues Mild Depression   H/o Rectal Bleeding in Past   CT abdomen was negative Treated Conservatively and her Bleeding stopped Gi Suspected Bleed due to Colonic AVMS No New episodes since then  .   She is stable. No new Nursing issues. No Behavior issues Her weight is stable Walks with her walker No Falls Wt Readings from Last 3 Encounters:  05/27/24 148 lb (67.1 kg)  01/29/24 151 lb 12.8 oz (68.9 kg)  01/02/24 150 lb 12.8 oz (68.4 kg)   Past Medical History:  Diagnosis Date   Acute blood loss anemia    Allergy    Asthma    AVM (arteriovenous malformation) of colon with hemorrhage    Bursitis of right hip    Cataract    CKD (chronic kidney disease), stage III (HCC) 11/04/2015   COLONIC POLYPS 03/26/2005   Qualifier: Diagnosis of  By: Clarise CMA (AAMA), June     DIVERTICULOSIS, COLON 03/26/2005   Qualifier: Diagnosis of  By: Clarise CMA LEODIS), June     DVT (deep venous thrombosis), left 11/03/2015   Ecchymosis 12/16/2015   Hyperglycemia 12/16/2015   Hyperlipidemia    Hypertension    Multiple skin tears 12/16/2015   Osteopenia    Stasis edema of both lower  extremities 11/04/2015   Unstable gait 12/16/2015   Urine incontinence 12/16/2015    Past Surgical History:  Procedure Laterality Date   ABDOMINAL HYSTERECTOMY  2006   Dr. Toney Moats   CATARACT EXTRACTION W/ INTRAOCULAR LENS IMPLANT Bilateral 1999/2003   Dr. Oneil Platts   COLONOSCOPY N/A 11/06/2015   Procedure: COLONOSCOPY;  Surgeon: Gustav LULLA Mcgee, MD;  Location: [facility];  Service: Endoscopy;  Laterality: N/A;  MAC if available, otherwise moderate sedation    Allergies[1]  Outpatient Encounter Medications as of 05/27/2024  Medication Sig   acetaminophen  (TYLENOL ) 500 MG tablet Take 500 mg by mouth 2 (two) times daily.    cholecalciferol  (VITAMIN D3) 25 MCG (1000 UNIT) tablet Take 1,000 Units by mouth daily.   docusate sodium (COLACE) 100 MG capsule Take 100 mg by mouth every morning.   escitalopram  (LEXAPRO ) 10 MG tablet Take 0.5 tablets (5 mg total) by mouth daily. Give 5 mg by mouth one time a day for anxiety;depression   furosemide  (LASIX ) 20 MG tablet Take 1 tablet (20 mg total) by mouth every other day.   gabapentin  (NEURONTIN ) 300 MG capsule Take 300 mg by mouth 2 (two) times daily.   HYDROcodone -acetaminophen  (NORCO/VICODIN) 5-325 MG tablet Take 1 tablet by mouth 2 (two) times daily. Twice a day 8:00 am,8:00 pmTake  1 tablet by mouth 2 (two) times daily. Twice a day 8:00 am,8:00 pm   hydrocortisone  (PROCTO-MED HC ) 2.5 % rectal cream Place 1 Application rectally as needed for hemorrhoids or anal itching. for external rectal hemorrhoids. unsupervised self-administration May self administer   Multiple Vitamins-Minerals (PRESERVISION AREDS) CAPS Take 1 capsule by mouth in the morning.   Multiple Vitamins-Minerals (THEREMS M PO) Take 1 tablet by mouth in the morning.   nystatin (MYCOSTATIN/NYSTOP) powder Apply 1 Application topically 2 (two) times daily as needed (irritation in groin area). Apply to groin 2 times daily as needed   Sodium Fluoride 1.1 % PSTE Place 1 Application onto  teeth daily.   zinc oxide 20 % ointment Apply 1 Application topically as needed for irritation (INCONTINENCE AND REDNESS.).   No facility-administered encounter medications on file as of 05/27/2024.    Review of Systems:  Review of Systems  Constitutional:  Negative for activity change and appetite change.  HENT: Negative.    Respiratory:  Negative for cough and shortness of breath.   Cardiovascular:  Negative for leg swelling.  Gastrointestinal:  Negative for constipation.  Genitourinary: Negative.   Musculoskeletal:  Positive for gait problem. Negative for arthralgias and myalgias.  Skin: Negative.   Neurological:  Negative for dizziness and weakness.  Psychiatric/Behavioral:  Positive for confusion. Negative for dysphoric mood and sleep disturbance.     Health Maintenance  Topic Date Due   Influenza Vaccine  12/12/2023   COVID-19 Vaccine (9 - 2025-26 season) 01/12/2024   FOOT EXAM  04/14/2024   HEMOGLOBIN A1C  04/16/2024   Medicare Annual Wellness (AWV)  07/24/2024   OPHTHALMOLOGY EXAM  09/14/2024   DTaP/Tdap/Td (4 - Td or Tdap) 01/05/2027   Pneumococcal Vaccine: 50+ Years  Completed   Bone Density Scan  Completed   Meningococcal B Vaccine  Aged Out   Zoster Vaccines- Shingrix  Discontinued    Physical Exam: There were no vitals filed for this visit. There is no height or weight on file to calculate BMI. Physical Exam Vitals reviewed.  Constitutional:      Appearance: Normal appearance.  HENT:     Head: Normocephalic.     Nose: Nose normal.     Mouth/Throat:     Mouth: Mucous membranes are moist.     Pharynx: Oropharynx is clear.  Eyes:     Pupils: Pupils are equal, round, and reactive to light.  Cardiovascular:     Rate and Rhythm: Normal rate and regular rhythm.     Pulses: Normal pulses.     Heart sounds: Normal heart sounds. No murmur heard. Pulmonary:     Effort: Pulmonary effort is normal.     Breath sounds: Normal breath sounds.  Abdominal:      General: Abdomen is flat. Bowel sounds are normal.     Palpations: Abdomen is soft.  Musculoskeletal:        General: No swelling.     Cervical back: Neck supple.  Skin:    General: Skin is warm.  Neurological:     General: No focal deficit present.     Mental Status: She is alert.  Psychiatric:        Mood and Affect: Mood normal.        Thought Content: Thought content normal.     Labs reviewed: Basic Metabolic Panel: Recent Labs    10/16/23 0000 01/08/24 0000 01/19/24 0000  NA 141  --  136*  K 4.1 4.2 3.6  CL 104  --  97*  CO2 28*  --  32*  BUN 32*  --  20  CREATININE 1.3*  --  0.9  CALCIUM  9.0  --  8.4*   Liver Function Tests: Recent Labs    10/16/23 0000  AST 18  ALT 10  ALKPHOS 40  ALBUMIN  3.7   No results for input(s): LIPASE, AMYLASE in the last 8760 hours. No results for input(s): AMMONIA in the last 8760 hours. CBC: Recent Labs    10/16/23 0000  WBC 6.1  NEUTROABS 3,684.00  HGB 11.8*  HCT 39  PLT 192   Lipid Panel: No results for input(s): CHOL, HDL, LDLCALC, TRIG, CHOLHDL, LDLDIRECT in the last 8760 hours. Lab Results  Component Value Date   HGBA1C 5.9 10/16/2023    Procedures since last visit: No results found.  Assessment/Plan 1. Mild cognitive impairment (Primary) 27/30 and Passed her Clock in the past Sons did not want referral to outside Neurology Do not want her to be started on Aricept Continue in [place]  2. Recurrent depression Doing well on Lexapro  No GDR  3. Type 2 diabetes mellitus with chronic kidney disease, without long-term current use of insulin , unspecified CKD stage (HCC) No Meds Repeat A1C  4. Stage 3b chronic kidney disease (HCC) Creat stable  5. Bilateral edema of lower extremity On lasix  6 Idiopathic peripheral neuropathy Gabapentin   7 Osteoarthritis involving multiple joints on both sides of body On Low dose of Norco  Labs/tests ordered:  CBC,CMP,A1C,TSh Next appt:  Visit date not  found        [1]  Allergies Allergen Reactions   Epinephrine Nausea Only    Unknown.   Hydrocodone -Acetaminophen  Nausea Only   Labetalol Other (See Comments)    Reaction not listed on MAR    Procaine Other (See Comments)    Reaction not listed on MAR    Vitamin D3 [Cholecalciferol ] Other (See Comments)    Allergy not listed on MAR    "

## 2024-05-30 ENCOUNTER — Encounter: Payer: Self-pay | Admitting: Internal Medicine

## 2024-06-02 ENCOUNTER — Other Ambulatory Visit: Payer: Self-pay | Admitting: Adult Health

## 2024-06-02 DIAGNOSIS — M159 Polyosteoarthritis, unspecified: Secondary | ICD-10-CM

## 2024-06-02 MED ORDER — HYDROCODONE-ACETAMINOPHEN 5-325 MG PO TABS: 1.0000 | ORAL_TABLET | Freq: Two times a day (BID) | ORAL | 0 refills | Status: AC
